# Patient Record
Sex: Female | Born: 1950 | Race: White | Hispanic: No | Smoking: Former smoker
Health system: Southern US, Community
[De-identification: ages and names within clinical notes are randomized; demographics above are authoritative.]

## PROBLEM LIST (undated history)

## (undated) DIAGNOSIS — K769 Liver disease, unspecified: Secondary | ICD-10-CM

## (undated) DIAGNOSIS — M5412 Radiculopathy, cervical region: Secondary | ICD-10-CM

## (undated) DIAGNOSIS — E119 Type 2 diabetes mellitus without complications: Secondary | ICD-10-CM

## (undated) DIAGNOSIS — E785 Hyperlipidemia, unspecified: Secondary | ICD-10-CM

## (undated) DIAGNOSIS — R918 Other nonspecific abnormal finding of lung field: Secondary | ICD-10-CM

## (undated) DIAGNOSIS — A499 Bacterial infection, unspecified: Secondary | ICD-10-CM

## (undated) DIAGNOSIS — F32A Depression, unspecified: Secondary | ICD-10-CM

## (undated) DIAGNOSIS — G4730 Sleep apnea, unspecified: Secondary | ICD-10-CM

## (undated) DIAGNOSIS — I7300 Raynaud's syndrome without gangrene: Secondary | ICD-10-CM

## (undated) DIAGNOSIS — Z1612 Extended spectrum beta lactamase (ESBL) resistance: Secondary | ICD-10-CM

## (undated) DIAGNOSIS — D696 Thrombocytopenia, unspecified: Secondary | ICD-10-CM

## (undated) DIAGNOSIS — D126 Benign neoplasm of colon, unspecified: Secondary | ICD-10-CM

## (undated) DIAGNOSIS — Z720 Tobacco use: Secondary | ICD-10-CM

## (undated) DIAGNOSIS — R590 Localized enlarged lymph nodes: Secondary | ICD-10-CM

## (undated) DIAGNOSIS — J449 Chronic obstructive pulmonary disease, unspecified: Secondary | ICD-10-CM

## (undated) DIAGNOSIS — C3491 Malignant neoplasm of unspecified part of right bronchus or lung: Secondary | ICD-10-CM

## (undated) DIAGNOSIS — G959 Disease of spinal cord, unspecified: Secondary | ICD-10-CM

## (undated) DIAGNOSIS — I700 Atherosclerosis of aorta: Secondary | ICD-10-CM

## (undated) DIAGNOSIS — Z87891 Personal history of nicotine dependence: Principal | ICD-10-CM

## (undated) DIAGNOSIS — K219 Gastro-esophageal reflux disease without esophagitis: Secondary | ICD-10-CM

## (undated) DIAGNOSIS — D509 Iron deficiency anemia, unspecified: Secondary | ICD-10-CM

## (undated) DIAGNOSIS — I2510 Atherosclerotic heart disease of native coronary artery without angina pectoris: Secondary | ICD-10-CM

## (undated) DIAGNOSIS — I340 Nonrheumatic mitral (valve) insufficiency: Secondary | ICD-10-CM

## (undated) DIAGNOSIS — R519 Headache, unspecified: Secondary | ICD-10-CM

## (undated) DIAGNOSIS — A419 Sepsis, unspecified organism: Secondary | ICD-10-CM

## (undated) DIAGNOSIS — K7460 Unspecified cirrhosis of liver: Secondary | ICD-10-CM

## (undated) DIAGNOSIS — I10 Essential (primary) hypertension: Secondary | ICD-10-CM

## (undated) DIAGNOSIS — M545 Low back pain, unspecified: Secondary | ICD-10-CM

## (undated) DIAGNOSIS — I8500 Esophageal varices without bleeding: Secondary | ICD-10-CM

## (undated) DIAGNOSIS — R296 Repeated falls: Secondary | ICD-10-CM

## (undated) DIAGNOSIS — G4700 Insomnia, unspecified: Secondary | ICD-10-CM

## (undated) DIAGNOSIS — M1990 Unspecified osteoarthritis, unspecified site: Secondary | ICD-10-CM

## (undated) DIAGNOSIS — R0600 Dyspnea, unspecified: Secondary | ICD-10-CM

## (undated) DIAGNOSIS — F329 Major depressive disorder, single episode, unspecified: Secondary | ICD-10-CM

## (undated) DIAGNOSIS — K766 Portal hypertension: Secondary | ICD-10-CM

## (undated) DIAGNOSIS — I671 Cerebral aneurysm, nonruptured: Secondary | ICD-10-CM

## (undated) DIAGNOSIS — F419 Anxiety disorder, unspecified: Secondary | ICD-10-CM

## (undated) DIAGNOSIS — N6019 Diffuse cystic mastopathy of unspecified breast: Secondary | ICD-10-CM

## (undated) DIAGNOSIS — C801 Malignant (primary) neoplasm, unspecified: Secondary | ICD-10-CM

## (undated) HISTORY — PX: CHOLECYSTECTOMY: SHX55

## (undated) HISTORY — DX: Sleep apnea, unspecified: G47.30

## (undated) HISTORY — PX: BREAST BIOPSY: SHX20

## (undated) HISTORY — PX: TONSILLECTOMY: SUR1361

## (undated) HISTORY — PX: ABDOMINAL HYSTERECTOMY: SHX81

## (undated) HISTORY — DX: Personal history of nicotine dependence: Z87.891

## (undated) HISTORY — PX: APPENDECTOMY: SHX54

## (undated) HISTORY — DX: Gastro-esophageal reflux disease without esophagitis: K21.9

## (undated) HISTORY — PX: EYE SURGERY: SHX253

## (undated) HISTORY — DX: Liver disease, unspecified: K76.9

---
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
## 1998-03-08 ENCOUNTER — Encounter: Payer: Self-pay | Admitting: Neurosurgery

## 1998-03-08 ENCOUNTER — Ambulatory Visit (HOSPITAL_COMMUNITY): Admission: RE | Admit: 1998-03-08 | Discharge: 1998-03-08 | Payer: Self-pay | Admitting: Neurosurgery

## 1998-03-12 ENCOUNTER — Ambulatory Visit: Admission: RE | Admit: 1998-03-12 | Discharge: 1998-03-12 | Payer: Self-pay | Admitting: Anesthesiology

## 2004-05-16 ENCOUNTER — Ambulatory Visit: Payer: Self-pay

## 2004-12-14 ENCOUNTER — Encounter: Admission: RE | Admit: 2004-12-14 | Discharge: 2004-12-14 | Payer: Self-pay | Admitting: Gastroenterology

## 2005-01-03 ENCOUNTER — Other Ambulatory Visit: Payer: Self-pay

## 2005-01-03 ENCOUNTER — Inpatient Hospital Stay: Payer: Self-pay | Admitting: Internal Medicine

## 2005-03-07 ENCOUNTER — Ambulatory Visit: Payer: Self-pay | Admitting: Internal Medicine

## 2005-05-18 ENCOUNTER — Ambulatory Visit: Payer: Self-pay | Admitting: Internal Medicine

## 2005-05-29 ENCOUNTER — Ambulatory Visit: Payer: Self-pay | Admitting: Internal Medicine

## 2005-06-19 ENCOUNTER — Ambulatory Visit: Payer: Self-pay | Admitting: Internal Medicine

## 2005-07-27 ENCOUNTER — Ambulatory Visit: Payer: Self-pay

## 2005-09-25 ENCOUNTER — Ambulatory Visit: Payer: Self-pay | Admitting: Surgery

## 2005-10-23 ENCOUNTER — Ambulatory Visit: Payer: Self-pay | Admitting: Gastroenterology

## 2005-12-04 ENCOUNTER — Ambulatory Visit: Payer: Self-pay | Admitting: Gastroenterology

## 2006-01-09 ENCOUNTER — Other Ambulatory Visit: Payer: Self-pay

## 2006-01-15 ENCOUNTER — Ambulatory Visit: Payer: Self-pay | Admitting: Gastroenterology

## 2006-04-17 ENCOUNTER — Ambulatory Visit: Payer: Self-pay | Admitting: Internal Medicine

## 2006-04-25 ENCOUNTER — Ambulatory Visit: Payer: Self-pay | Admitting: Internal Medicine

## 2006-05-21 ENCOUNTER — Ambulatory Visit: Payer: Self-pay | Admitting: Internal Medicine

## 2006-10-08 ENCOUNTER — Ambulatory Visit: Payer: Self-pay | Admitting: Anesthesiology

## 2006-11-05 ENCOUNTER — Ambulatory Visit: Payer: Self-pay | Admitting: Anesthesiology

## 2006-11-16 ENCOUNTER — Ambulatory Visit: Payer: Self-pay | Admitting: Family Medicine

## 2006-12-11 ENCOUNTER — Ambulatory Visit: Payer: Self-pay | Admitting: Anesthesiology

## 2007-01-30 ENCOUNTER — Ambulatory Visit: Payer: Self-pay | Admitting: Internal Medicine

## 2007-04-24 ENCOUNTER — Ambulatory Visit: Payer: Self-pay | Admitting: Anesthesiology

## 2007-05-14 ENCOUNTER — Observation Stay: Payer: Self-pay | Admitting: Internal Medicine

## 2007-05-22 ENCOUNTER — Ambulatory Visit: Payer: Self-pay | Admitting: Internal Medicine

## 2007-06-24 ENCOUNTER — Ambulatory Visit: Payer: Self-pay | Admitting: Anesthesiology

## 2007-06-26 ENCOUNTER — Ambulatory Visit: Payer: Self-pay | Admitting: Internal Medicine

## 2007-09-03 ENCOUNTER — Ambulatory Visit: Payer: Self-pay | Admitting: Anesthesiology

## 2007-09-25 ENCOUNTER — Ambulatory Visit: Payer: Self-pay | Admitting: Internal Medicine

## 2007-09-26 ENCOUNTER — Ambulatory Visit: Payer: Self-pay | Admitting: Internal Medicine

## 2008-01-15 ENCOUNTER — Ambulatory Visit: Payer: Self-pay | Admitting: Anesthesiology

## 2008-05-25 ENCOUNTER — Ambulatory Visit: Payer: Self-pay | Admitting: Internal Medicine

## 2008-06-29 ENCOUNTER — Ambulatory Visit: Payer: Self-pay | Admitting: Internal Medicine

## 2008-09-22 ENCOUNTER — Ambulatory Visit: Payer: Self-pay | Admitting: Anesthesiology

## 2008-12-09 ENCOUNTER — Ambulatory Visit: Payer: Self-pay | Admitting: Anesthesiology

## 2009-02-03 ENCOUNTER — Ambulatory Visit: Payer: Self-pay | Admitting: Anesthesiology

## 2009-06-02 ENCOUNTER — Ambulatory Visit: Payer: Self-pay | Admitting: Internal Medicine

## 2009-06-28 ENCOUNTER — Ambulatory Visit: Payer: Self-pay | Admitting: Anesthesiology

## 2009-09-23 ENCOUNTER — Ambulatory Visit: Payer: Self-pay | Admitting: Internal Medicine

## 2009-10-18 ENCOUNTER — Ambulatory Visit: Payer: Self-pay | Admitting: Anesthesiology

## 2010-02-09 ENCOUNTER — Ambulatory Visit: Payer: Self-pay | Admitting: Anesthesiology

## 2010-04-14 ENCOUNTER — Ambulatory Visit: Payer: Self-pay | Admitting: Internal Medicine

## 2010-04-20 ENCOUNTER — Ambulatory Visit: Payer: Self-pay | Admitting: Anesthesiology

## 2010-11-07 ENCOUNTER — Ambulatory Visit: Payer: Self-pay | Admitting: Internal Medicine

## 2010-11-11 ENCOUNTER — Emergency Department: Payer: Self-pay | Admitting: Emergency Medicine

## 2011-11-21 ENCOUNTER — Ambulatory Visit: Payer: Self-pay | Admitting: Internal Medicine

## 2011-12-22 ENCOUNTER — Ambulatory Visit: Payer: Self-pay | Admitting: Internal Medicine

## 2011-12-22 LAB — CREATININE, SERUM
Creatinine: 0.85 mg/dL (ref 0.60–1.30)
EGFR (African American): >60
EGFR (Non-African Amer.): >60

## 2012-07-30 ENCOUNTER — Ambulatory Visit: Payer: Self-pay | Admitting: Physician Assistant

## 2012-10-02 ENCOUNTER — Ambulatory Visit: Payer: Self-pay | Admitting: Internal Medicine

## 2012-11-21 ENCOUNTER — Ambulatory Visit: Payer: Self-pay | Admitting: Internal Medicine

## 2012-12-24 ENCOUNTER — Ambulatory Visit: Payer: Self-pay | Admitting: Gastroenterology

## 2012-12-24 LAB — CBC WITH DIFFERENTIAL/PLATELET
Basophil #: 0 10*3/uL (ref 0.0–0.1)
Basophil %: 0.9 %
Eosinophil #: 0.1 10*3/uL (ref 0.0–0.7)
Eosinophil %: 1.9 %
HCT: 39.2 % (ref 35.0–47.0)
HGB: 13.2 g/dL (ref 12.0–16.0)
Lymphocyte #: 1.3 10*3/uL (ref 1.0–3.6)
Lymphocyte %: 28.6 %
MCH: 29.9 pg (ref 26.0–34.0)
MCHC: 33.5 g/dL (ref 32.0–36.0)
MCV: 89 fL (ref 80–100)
Monocyte #: 0.3 x10 3/mm (ref 0.2–0.9)
Monocyte %: 6.9 %
Neutrophil #: 2.8 10*3/uL (ref 1.4–6.5)
Neutrophil %: 61.7 %
Platelet: 91 10*3/uL — ABNORMAL LOW (ref 150–440)
RBC: 4.4 10*6/uL (ref 3.80–5.20)
RDW: 14.8 % — ABNORMAL HIGH (ref 11.5–14.5)
WBC: 4.6 10*3/uL (ref 3.6–11.0)

## 2012-12-24 LAB — PROTIME-INR
INR: 1.1
Prothrombin Time: 14.3 secs (ref 11.5–14.7)

## 2012-12-24 LAB — APTT: Activated PTT: 29.4 secs (ref 23.6–35.9)

## 2012-12-27 LAB — PATHOLOGY REPORT

## 2013-02-02 ENCOUNTER — Ambulatory Visit: Payer: Self-pay | Admitting: Hematology and Oncology

## 2013-02-17 ENCOUNTER — Inpatient Hospital Stay: Payer: Self-pay | Admitting: Internal Medicine

## 2013-02-17 LAB — COMPREHENSIVE METABOLIC PANEL
ALT: 28 U/L (ref 12–78)
ANION GAP: 6 — AB (ref 7–16)
Albumin: 3.3 g/dL — ABNORMAL LOW (ref 3.4–5.0)
Alkaline Phosphatase: 108 U/L
BILIRUBIN TOTAL: 0.5 mg/dL (ref 0.2–1.0)
BUN: 32 mg/dL — AB (ref 7–18)
CO2: 24 mmol/L (ref 21–32)
CREATININE: 0.81 mg/dL (ref 0.60–1.30)
Calcium, Total: 9.5 mg/dL (ref 8.5–10.1)
Chloride: 106 mmol/L (ref 98–107)
EGFR (Non-African Amer.): >60
Glucose: 469 mg/dL — ABNORMAL HIGH (ref 65–99)
Osmolality: 299 (ref 275–301)
POTASSIUM: 4.4 mmol/L (ref 3.5–5.1)
SGOT(AST): 27 U/L (ref 15–37)
Sodium: 136 mmol/L (ref 136–145)
Total Protein: 6.6 g/dL (ref 6.4–8.2)

## 2013-02-17 LAB — HEMATOCRIT: HCT: 29.1 % — AB (ref 35.0–47.0)

## 2013-02-17 LAB — PROTIME-INR
INR: 1.2
PROTHROMBIN TIME: 14.9 s — AB (ref 11.5–14.7)

## 2013-02-17 LAB — CBC
HCT: 31.7 % — AB (ref 35.0–47.0)
HGB: 10.8 g/dL — AB (ref 12.0–16.0)
MCH: 31.6 pg (ref 26.0–34.0)
MCHC: 34.1 g/dL (ref 32.0–36.0)
MCV: 93 fL (ref 80–100)
PLATELETS: 162 10*3/uL (ref 150–440)
RBC: 3.42 10*6/uL — ABNORMAL LOW (ref 3.80–5.20)
RDW: 15 % — ABNORMAL HIGH (ref 11.5–14.5)
WBC: 10.1 10*3/uL (ref 3.6–11.0)

## 2013-02-17 LAB — HEMOGLOBIN: HGB: 9.6 g/dL — AB (ref 12.0–16.0)

## 2013-02-18 LAB — CBC WITH DIFFERENTIAL/PLATELET
BASOS ABS: 0.1 10*3/uL (ref 0.0–0.1)
Basophil %: 0.7 %
EOS PCT: 0.9 %
Eosinophil #: 0.1 10*3/uL (ref 0.0–0.7)
HCT: 27.3 % — AB (ref 35.0–47.0)
HGB: 9.4 g/dL — AB (ref 12.0–16.0)
Lymphocyte #: 2.4 10*3/uL (ref 1.0–3.6)
Lymphocyte %: 31 %
MCH: 31 pg (ref 26.0–34.0)
MCHC: 34.5 g/dL (ref 32.0–36.0)
MCV: 90 fL (ref 80–100)
MONOS PCT: 7 %
Monocyte #: 0.5 x10 3/mm (ref 0.2–0.9)
NEUTROS ABS: 4.7 10*3/uL (ref 1.4–6.5)
Neutrophil %: 60.4 %
PLATELETS: 90 10*3/uL — AB (ref 150–440)
RBC: 3.04 10*6/uL — ABNORMAL LOW (ref 3.80–5.20)
RDW: 14.6 % — AB (ref 11.5–14.5)
WBC: 7.7 10*3/uL (ref 3.6–11.0)

## 2013-02-18 LAB — HEMOGLOBIN
HGB: 8.8 g/dL — ABNORMAL LOW (ref 12.0–16.0)
HGB: 8.7 g/dL — ABNORMAL LOW (ref 12.0–16.0)

## 2013-02-19 LAB — COMPREHENSIVE METABOLIC PANEL
Albumin: 2.6 g/dL — ABNORMAL LOW (ref 3.4–5.0)
Alkaline Phosphatase: 66 U/L
Anion Gap: 7 (ref 7–16)
BUN: 22 mg/dL — ABNORMAL HIGH (ref 7–18)
Bilirubin,Total: 0.3 mg/dL (ref 0.2–1.0)
CALCIUM: 7.8 mg/dL — AB (ref 8.5–10.1)
Chloride: 115 mmol/L — ABNORMAL HIGH (ref 98–107)
Co2: 21 mmol/L (ref 21–32)
Creatinine: 0.89 mg/dL (ref 0.60–1.30)
EGFR (Non-African Amer.): >60
Glucose: 255 mg/dL — ABNORMAL HIGH (ref 65–99)
OSMOLALITY: 297 (ref 275–301)
Potassium: 3.9 mmol/L (ref 3.5–5.1)
SGOT(AST): 34 U/L (ref 15–37)
SGPT (ALT): 21 U/L (ref 12–78)
SODIUM: 143 mmol/L (ref 136–145)
Total Protein: 5.2 g/dL — ABNORMAL LOW (ref 6.4–8.2)

## 2013-02-19 LAB — CBC WITH DIFFERENTIAL/PLATELET
BASOS ABS: 0 10*3/uL (ref 0.0–0.1)
Basophil %: 0.8 %
EOS ABS: 0.1 10*3/uL (ref 0.0–0.7)
EOS PCT: 2.5 %
HCT: 24.4 % — AB (ref 35.0–47.0)
HGB: 8.5 g/dL — ABNORMAL LOW (ref 12.0–16.0)
LYMPHS ABS: 1.7 10*3/uL (ref 1.0–3.6)
Lymphocyte %: 32.7 %
MCH: 32 pg (ref 26.0–34.0)
MCHC: 35 g/dL (ref 32.0–36.0)
MCV: 91 fL (ref 80–100)
MONOS PCT: 5 %
Monocyte #: 0.3 x10 3/mm (ref 0.2–0.9)
Neutrophil #: 3.1 10*3/uL (ref 1.4–6.5)
Neutrophil %: 59 %
Platelet: 76 10*3/uL — ABNORMAL LOW (ref 150–440)
RBC: 2.67 10*6/uL — ABNORMAL LOW (ref 3.80–5.20)
RDW: 14.6 % — AB (ref 11.5–14.5)
WBC: 5.2 10*3/uL (ref 3.6–11.0)

## 2013-02-19 LAB — HEMOGLOBIN: HGB: 8.5 g/dL — AB (ref 12.0–16.0)

## 2013-02-19 LAB — HEMATOCRIT: HCT: 23.2 % — AB (ref 35.0–47.0)

## 2013-02-20 LAB — CBC WITH DIFFERENTIAL/PLATELET
Basophil #: 0 10*3/uL (ref 0.0–0.1)
Basophil %: 0.8 %
EOS ABS: 0.1 10*3/uL (ref 0.0–0.7)
Eosinophil %: 2.1 %
Eosinophil: 1 %
HCT: 23.2 % — AB (ref 35.0–47.0)
HGB: 8.1 g/dL — ABNORMAL LOW (ref 12.0–16.0)
Lymphocyte #: 1.1 10*3/uL (ref 1.0–3.6)
Lymphocyte %: 31 %
Lymphocytes: 36 %
MCH: 31.9 pg (ref 26.0–34.0)
MCHC: 35 g/dL (ref 32.0–36.0)
MCV: 91 fL (ref 80–100)
MONO ABS: 0.2 x10 3/mm (ref 0.2–0.9)
MONOS PCT: 5.4 %
Monocytes: 11 %
NEUTROS PCT: 60.7 %
Neutrophil #: 2.2 10*3/uL (ref 1.4–6.5)
Platelet: 67 10*3/uL — ABNORMAL LOW (ref 150–440)
RBC: 2.55 10*6/uL — AB (ref 3.80–5.20)
RDW: 14.3 % (ref 11.5–14.5)
SEGMENTED NEUTROPHILS: 52 %
WBC: 3.6 10*3/uL (ref 3.6–11.0)

## 2013-02-20 LAB — BASIC METABOLIC PANEL
ANION GAP: 5 — AB (ref 7–16)
BUN: 12 mg/dL (ref 7–18)
Calcium, Total: 8.2 mg/dL — ABNORMAL LOW (ref 8.5–10.1)
Chloride: 112 mmol/L — ABNORMAL HIGH (ref 98–107)
Co2: 25 mmol/L (ref 21–32)
Creatinine: 0.81 mg/dL (ref 0.60–1.30)
EGFR (African American): >60
EGFR (Non-African Amer.): >60
Glucose: 173 mg/dL — ABNORMAL HIGH (ref 65–99)
Osmolality: 287 (ref 275–301)
Potassium: 3.6 mmol/L (ref 3.5–5.1)
Sodium: 142 mmol/L (ref 136–145)

## 2013-02-21 LAB — CBC WITH DIFFERENTIAL/PLATELET
BASOS PCT: 0.8 %
Basophil #: 0 10*3/uL (ref 0.0–0.1)
EOS ABS: 0.1 10*3/uL (ref 0.0–0.7)
EOS PCT: 2.5 %
HCT: 22.5 % — AB (ref 35.0–47.0)
HGB: 8 g/dL — ABNORMAL LOW (ref 12.0–16.0)
Lymphocyte #: 1.2 10*3/uL (ref 1.0–3.6)
Lymphocyte %: 41.7 %
MCH: 32.3 pg (ref 26.0–34.0)
MCHC: 35.4 g/dL (ref 32.0–36.0)
MCV: 91 fL (ref 80–100)
MONOS PCT: 6.1 %
Monocyte #: 0.2 x10 3/mm (ref 0.2–0.9)
Neutrophil #: 1.5 10*3/uL (ref 1.4–6.5)
Neutrophil %: 48.9 %
Platelet: 61 10*3/uL — ABNORMAL LOW (ref 150–440)
RBC: 2.47 10*6/uL — ABNORMAL LOW (ref 3.80–5.20)
RDW: 14.1 % (ref 11.5–14.5)
WBC: 3 10*3/uL — ABNORMAL LOW (ref 3.6–11.0)

## 2013-02-21 LAB — FOLATE: Folic Acid: 37.1 ng/mL (ref 3.1–100.0)

## 2013-02-22 LAB — CBC WITH DIFFERENTIAL/PLATELET
Basophil #: 0 10*3/uL (ref 0.0–0.1)
Basophil %: 0.6 %
EOS ABS: 0.1 10*3/uL (ref 0.0–0.7)
Eosinophil %: 2 %
HCT: 23.8 % — AB (ref 35.0–47.0)
HGB: 8.1 g/dL — AB (ref 12.0–16.0)
Lymphocyte #: 1.3 10*3/uL (ref 1.0–3.6)
Lymphocyte %: 34.6 %
MCH: 31.1 pg (ref 26.0–34.0)
MCHC: 34.2 g/dL (ref 32.0–36.0)
MCV: 91 fL (ref 80–100)
Monocyte #: 0.2 x10 3/mm (ref 0.2–0.9)
Monocyte %: 5.5 %
NEUTROS PCT: 57.3 %
Neutrophil #: 2.2 10*3/uL (ref 1.4–6.5)
Platelet: 71 10*3/uL — ABNORMAL LOW (ref 150–440)
RBC: 2.61 10*6/uL — AB (ref 3.80–5.20)
RDW: 14.1 % (ref 11.5–14.5)
WBC: 3.8 10*3/uL (ref 3.6–11.0)

## 2013-03-17 ENCOUNTER — Ambulatory Visit: Payer: Self-pay | Admitting: Gastroenterology

## 2013-04-02 ENCOUNTER — Ambulatory Visit: Payer: Self-pay | Admitting: Hematology and Oncology

## 2013-05-12 ENCOUNTER — Ambulatory Visit: Payer: Self-pay | Admitting: Gastroenterology

## 2013-06-23 ENCOUNTER — Ambulatory Visit: Payer: Self-pay | Admitting: Gastroenterology

## 2013-08-07 ENCOUNTER — Ambulatory Visit: Payer: Self-pay | Admitting: Internal Medicine

## 2013-11-24 ENCOUNTER — Ambulatory Visit: Payer: Self-pay | Admitting: Internal Medicine

## 2013-11-28 ENCOUNTER — Emergency Department: Payer: Self-pay | Admitting: Emergency Medicine

## 2013-11-28 LAB — URINALYSIS, COMPLETE
Bilirubin,UR: NEGATIVE
Blood: NEGATIVE
GLUCOSE, UR: NEGATIVE mg/dL (ref 0–75)
KETONE: NEGATIVE
Leukocyte Esterase: NEGATIVE
Nitrite: NEGATIVE
PROTEIN: NEGATIVE
Ph: 6 (ref 4.5–8.0)
RBC,UR: 1 /HPF (ref 0–5)
Specific Gravity: 1.002 (ref 1.003–1.030)
Squamous Epithelial: 2

## 2013-11-28 LAB — COMPREHENSIVE METABOLIC PANEL
ALBUMIN: 3.7 g/dL (ref 3.4–5.0)
ANION GAP: 6 — AB (ref 7–16)
Alkaline Phosphatase: 138 U/L — ABNORMAL HIGH
BUN: 10 mg/dL (ref 7–18)
Bilirubin,Total: 0.4 mg/dL (ref 0.2–1.0)
Calcium, Total: 8.8 mg/dL (ref 8.5–10.1)
Chloride: 103 mmol/L (ref 98–107)
Co2: 28 mmol/L (ref 21–32)
Creatinine: 0.68 mg/dL (ref 0.60–1.30)
EGFR (Non-African Amer.): >60
GLUCOSE: 126 mg/dL — AB (ref 65–99)
Osmolality: 274 (ref 275–301)
POTASSIUM: 3.8 mmol/L (ref 3.5–5.1)
SGOT(AST): 35 U/L (ref 15–37)
SGPT (ALT): 34 U/L
Sodium: 137 mmol/L (ref 136–145)
Total Protein: 7.7 g/dL (ref 6.4–8.2)

## 2013-11-28 LAB — CBC WITH DIFFERENTIAL/PLATELET
Basophil #: 0.1 10*3/uL (ref 0.0–0.1)
Basophil %: 0.8 %
Eosinophil #: 0.1 10*3/uL (ref 0.0–0.7)
Eosinophil %: 1.9 %
HCT: 38 % (ref 35.0–47.0)
HGB: 12.3 g/dL (ref 12.0–16.0)
Lymphocyte #: 1.7 10*3/uL (ref 1.0–3.6)
Lymphocyte %: 26.1 %
MCH: 27.2 pg (ref 26.0–34.0)
MCHC: 32.4 g/dL (ref 32.0–36.0)
MCV: 84 fL (ref 80–100)
Monocyte #: 0.5 x10 3/mm (ref 0.2–0.9)
Monocyte %: 8 %
Neutrophil #: 4.1 10*3/uL (ref 1.4–6.5)
Neutrophil %: 63.2 %
Platelet: 126 10*3/uL — ABNORMAL LOW (ref 150–440)
RBC: 4.52 10*6/uL (ref 3.80–5.20)
RDW: 17.3 % — ABNORMAL HIGH (ref 11.5–14.5)
WBC: 6.5 10*3/uL (ref 3.6–11.0)

## 2013-11-28 LAB — LIPASE, BLOOD: LIPASE: 77 U/L (ref 73–393)

## 2014-03-23 ENCOUNTER — Ambulatory Visit: Payer: Self-pay | Admitting: Gastroenterology

## 2014-04-14 ENCOUNTER — Emergency Department
Admit: 2014-04-14 | Disposition: A | Discharge status: Home / Self Care | Payer: Self-pay | Admitting: Emergency Medicine

## 2014-04-17 ENCOUNTER — Other Ambulatory Visit: Payer: Self-pay | Admitting: Gastroenterology

## 2014-04-17 DIAGNOSIS — K7460 Unspecified cirrhosis of liver: Secondary | ICD-10-CM

## 2014-04-25 NOTE — Consult Note (Signed)
Details:   - EGD done today.   5 bands appliled.  Some bleeding during procedure but no bleeding at end.   Recs:  cont octreotide and protonix drips - watch H/h and hemodynamics closely - NO NG tube for 7 days - clear liquids for 48 hours - cont Abx. - keep pt in ICU until tomorrow.  - call GI consultant with any changes in clinical status.  - will cont to follow   Electronic Signatures for Addendum Section:  Arther Dames (MD) (Signed Addendum 17-Feb-15 17:10)  Will need to start nadolol in about 24 hours.   Electronic Signatures: Arther Dames (MD)  (Signed 17-Feb-15 16:41)  Authored: Details   Last Updated: 17-Feb-15 17:10 by Arther Dames (MD)

## 2014-04-25 NOTE — H&P (Signed)
PATIENT NAME:  Vanessa Oneill, Vanessa Oneill MR#:  [id number] DATE OF BIRTH:  05/23/1950  DATE OF ADMISSION:  02/17/2013  REFERRING PHYSICIAN: Dr. Owens Shark  PRIMARY CARE PHYSICIAN: Dr. Ramonita Lab from [facility].   CHIEF COMPLAINT: Vomiting blood.   HISTORY OF PRESENT ILLNESS: A 64 year old Caucasian female with a history of cirrhosis, nonalcoholic, as well as diabetes, presenting with hematemesis, one day duration of hematemesis, started coffee-ground earlier this morning, progressed to frank blood. In the Emergency Department, she had 1.5 liters frank blood hematemesis. She had associated abdominal pain described as cramping, epigastric, nonradiating, 6 to 7 out of 10. No worsening or relieving factors. Emergency Department course: Received 2 units of packed red blood cells, now hemodynamically stable.   REVIEW OF SYSTEMS:  CONSTITUTIONAL: Denies fevers, fatigue. Positive for weakness.  EYES: Denies blurred vision, double vision, eye pain.  EARS, NOSE, THROAT: Denies tinnitus, ear pain, hearing loss.  RESPIRATORY: Denies cough, wheeze, shortness of breath.  CARDIOVASCULAR: Denies chest pain, palpitations, edemaGASTROINTESTINAL: Positive for hematemesis and nausea, abdominal pain as described above.  GENITOURINARY: Denies dysuria, hematuria.  ENDOCRINE: Denies nocturia or thyroid problems.  HEMATOLOGIC AND LYMPHATIC:  Positive for bleeding, as described above. Denies easy bruising.  SKIN: Denies rash or lesion.  MUSCULOSKELETAL: Denies pain in neck, back, shoulder, knees, hips  arthritic symptoms.  NEUROLOGIC: Denies paralysis, paresthesias.  PSYCHIATRIC: Denies anxiety or depressive symptoms.  Otherwise, full review of systems performed by me is negative.   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 She was only recently diagnosed. No history of SBP. No history of GI bleeds.   SOCIAL HISTORY: Denies any alcohol use. Positive for tobacco  use. Denies any drug use.   FAMILY HISTORY: Positive for hypertension. No known cirrhosis.   ALLERGIES: Amoxicillin, Betadine, codeine.   HOME MEDICATIONS: Aspirin 81 mg p.o. daily, Norco 325/5 mg p.o. q.6 hours as needed for pain, trazodone 50 mg p.o. at bedtime, Glimepiride 4 mg p.o. b.i.d., metformin 1000 mg p.o. b.i.d., Victoza 18 mg subcutaneous at bedtime, risperidone 1 tab p.o. at bedtime, Xanax 0.25 mg p.o. q.8 hours as needed for anxiety, Advair 250/50 mcg 1 puff b.i.d. as needed for shortness of breath, Albuterol 90 mcg inhalation 2 puffs q.4 hours as needed for shortness of breath, cyclobenzaprine 10 mg p.o. 3 times daily as needed for muscle spasm, Protonix 40 mg p.o. b.i.d.   PHYSICAL EXAMINATION: VITAL SIGNS: Temperature 98.2, respirations 20, heart rate 126, blood pressure 106/60, saturating 100% on room air. Weight 88.5 kg, BMI 29.7.  GENERAL: Critically ill-appearing Caucasian female, currently in minimal distress.  HEAD: Normocephalic, atraumatic.  EYES: Pupils equal, round and reactive to light. Extraocular muscles intact. No scleral icterus. Pale conjunctiva.  MOUTH: Dry mucosal membranes. Dentition intact. No abscess noted.  EARS, NOSE, AND THROAT: Throat clear, without exudates. No external lesions.  NECK: Supple. No thyromegaly. No nodules. No JVD.  PULMONARY: Clear to auscultation bilaterally. No wheezes, rhonchi. No accessory muscle use. Good respiratory effort. CHEST: Nontender to palpation.  CARDIOVASCULAR: S1, S2. Regular rate and rhythm. No murmurs, rubs, or gallops. No edema. Pedal pulses 2+ bilaterally.  GASTROINTESTINAL: Soft. Tenderness to palpation over epigastric, without rebound or guarding. Mild distention. No masses. Positive bowel sounds. No hepatosplenomegaly.  MUSCULOSKELETAL: No swelling, clubbing, edema. Range of motion full in all extremities.  NEUROLOGIC: Cranial nerves II through XII intact. No gross focal neurological deficit. Sensation intact.  Reflexes intact.  SKIN: No ulcerations, lesions, rash, cyanosis. Skin warm and  dry. Turgor poor.  PSYCHIATRIC: Mood and affect within normal limits. Patient awake, alert, oriented x3. Insight and judgment intact.   LABORATORY DATA: Sodium 136, potassium 4.4, chloride 106, bicarbonate 24, BUN 32, creatinine 0.81, glucose 469. LFTs: Albumin 3.3, otherwise within normal limits. WBC 10.1, hemoglobin 10.8, platelets of 162.   ASSESSMENT AND PLAN: A 64 year old Caucasian female with history of cirrhosis, presenting with hematemesis.  1.  Gastrointestinal bleed with hematemesis. Case discussed with Dr. Allen Norris by Emergency Department. Given cirrhosis, suspect this is esophageal in nature. Will schedule Zofran, CBC every 6 hours. She has already been typed and crossed to receive 2 units of packed red blood cells. If she requires more transfusion, she will need fresh frozen plasma as well as platelets. Started on octreotide drip; increase drip rate to 50 mcg/hour. Protonix drip is already running. Antibiotic coverage for spontaneous bacterial peritonitis prophylaxis.  2.  Diabetes. Hold oral agents. Add insulin sliding scale.  3.  Depression. Continue trazodone.  4.  Deep venous thrombosis prophylaxis with sequential compression devices.   CODE STATUS: The patient is full code.   CRITICAL CARE TIME SPENT: 45 minutes    ____________________________ Aaron Mose. Hower, MD dkh:cg D: 02/17/2013 23:05:15 ET T: 02/17/2013 23:40:15 ET JOB#: [id number]  cc: Aaron Mose. Hower, MD, <Dictator> DAVID Woodfin Ganja MD ELECTRONICALLY SIGNED 02/18/2013 2:06

## 2014-04-25 NOTE — Discharge Summary (Signed)
PATIENT NAME:  Vanessa Oneill, Vanessa Oneill MR#:  [id number] DATE OF BIRTH:  08-01-1950  DATE OF ADMISSION:  02/17/2013 DATE OF DISCHARGE:  02/22/2013   FINAL DIAGNOSES:  1. Acute gastrointestinal bleeding secondary to esophageal varices.  2. Acute blood loss anemia secondary to #1.  3. Cirrhosis, biopsy-proven stage II, thought to be secondary to fatty liver disease, and likely a source of the varices.  4. Chronic pain. 5. Adult-onset diabetes mellitus.  6. Hypertension.   HISTORY AND PHYSICAL: Please see dictated admission history and physical.   [place]: The patient was admitted with gross hematemesis. Dropped her hemoglobin level down to approximately 8. Required 2-unit transfusion. Saw GI, and she underwent endoscopy, with finding of bleeding esophageal varices. These were banded successfully with no further bleeding. Hemoglobin stayed stable between 8 and 8.5 for the remainder of her hospitalization. Her diet was slowly advanced, and on the day of discharge, she is ambulating independently, and she is tolerating soft foods.   She was noted to have worsening thrombocytopenia and leukopenia as well; she was evaluated by oncology, who thought this was all due to her cirrhosis and splenomegaly. Her blood counts did improve slightly, and at this point, was felt stable for her to go home. She needs to check her sugars twice a day and record these as 2 of her diabetic medications will be held at this point. She should follow a carbohydrate-controlled diet and keep her diet soft for 1 week. She will follow up in our office in 1 to 2 weeks and with Dr. Rayann Heman in 2 to 4 weeks.   DISCHARGE MEDICATIONS:  1. Metformin 1000 mg p.o. b.i.d.  2. Albuterol MDI 2 puffs 4 times a day as needed for shortness of breath.  3. Xanax 0.25 mg p.o. q.8 hours as needed for anxiety.  4. Pantoprazole 40 mg p.o. b.i.d.  5. Trazodone 50 mg p.o. at bedtime.  6. Risperdal 1 mg p.o. at bedtime.  7. Norco 5/325 one  p.o. q.6 hours as needed for pain.  8. Advair 250/50 one puff b.i.d. for COPD.  9. Levaquin 500 mg p.o. daily for 4 days to complete a 7-day course.   She will hold cyclobenzaprine, glimepiride and Victoza at this point. If her sugars begin rising, she may reintroduce the glimepiride.   ____________________________ Adin Hector, MD bjk:lb D: 02/22/2013 11:01:43 ET T: 02/22/2013 11:38:55 ET JOB#: [id number]  cc: Adin Hector, MD, <Dictator> Ramonita Lab MD ELECTRONICALLY SIGNED 02/26/2013 8:05

## 2014-04-25 NOTE — Consult Note (Signed)
PATIENT NAME:  Vanessa Oneill, Vanessa Oneill MR#:  [id number] DATE OF BIRTH:  03-Apr-1950  DATE OF CONSULTATION:  02/18/2013  REFERRING PHYSICIAN:  Dr. Ramonita Lab CONSULTING PHYSICIAN:  Arther Dames, MD  REASON FOR THE CONSULT:  Hematemesis.   HISTORY OF PRESENT ILLNESS:  Ms. Presswood is a 64 year old female with a history of cirrhosis, likely secondary to NAFLD, diabetes, presenting to the Emergency Room for evaluation of hematemesis. Ms. Grandt and her husband report that she initially threw up a small amount of dark material. However, shortly after this, she then did throw up multiple times some very bright red blood material. She also has had several episodes of dark sticky bowel movements.   Fortunately, since the Emergency Room last night, she has not had any further episodes of hematemesis or melena. She is currently feeling weak and tired, but otherwise does not have any additional complaints at this time.   She has never had bleeding before. She also of note was scheduled for an upper endoscopy on Thursday of this week due to her cirrhosis.   PAST MEDICAL HISTORY: 1.  Cirrhosis, likely NAFLD.  2.  Diabetes.  3.  Depression.   HOME MEDICATIONS:  1.  Aspirin 81 mg daily.  2.  Norco 325/5.  3.  Trazodone 50 mg at bedtime.  4.  Glimepiride 4 mg b.i.d.  5.  Metformin 1000 mg b.i.d.  6.  Victoza 18 mg subcu at bedtime.  7.  Risperidone 1 tablet at bedtime.  8.  Xanax 0.25 q.8.  9.  Advair 250/50 one puff b.i.d.  10.  Albuterol 90 mcg 2 puffs every 4 hours.  11.  Cyclobenzaprine 10 mg t.i.d.  12.  Protonix 40 mg b.i.d.   REVIEW OF SYSTEMS:  A 10-system review was conducted. It is negative except as stated in the HPI.   SOCIAL HISTORY:  She denies any alcohol to me. She is still an ongoing smoker.   FAMILY HISTORY:  She denies any family history of liver disease or GI malignancy.   ALLERGIES:  AMOXICILLIN, BETADINE, and CODEINE.   PHYSICAL EXAMINATION:   VITAL SIGNS:  Her current  temperature is 97.9, her pulse is an 84, blood pressure is 107/62, pulse ox is a 99% on 2 L of oxygen.   GENERAL: Alert and oriented times 4.  No acute distress. Appears stated age. Slightly somnolent. HEENT: Normocephalic/atraumatic. Extraocular movements are intact. Anicteric. NECK: Soft, supple. JVP appears normal. No adenopathy. CHEST: Clear to auscultation. No wheeze or crackle. Respirations unlabored. HEART: Regular. No murmur, rub, or gallop.  Normal S1 and S2. ABDOMEN: Soft, nontender, nondistended.  Normal active bowel sounds in all four quadrants.  No organomegaly. No masses. Positive for abdominal adiposity. EXTREMITIES: No swelling, well perfused. SKIN: No rash or lesion. Skin color, texture, turgor normal. NEUROLOGICAL: Grossly intact. PSYCHIATRIC: Normal tone and affect. MUSCULOSKELETAL: No joint swelling or erythema.    DATA:  Her sodium is 136, potassium 4.4, BUN 32, creatinine 0.81. Her liver enzymes are notable for an albumin of 3.3, otherwise normal liver enzymes. Her white count is 10.1; her hemoglobin is 8.7, which is down from 10.8 on presentation; platelets are 162. Her INR is 1.2.   ASSESSMENT AND PLAN:  Hematemesis: She does have a history of cirrhosis, which was proven on liver biopsy. Her labs are not particularly striking. It is not clear whether this is a variceal bleed or whether this is due to peptic ulcer disease. Nonetheless, fortunately, it appears as though the bleeding has  stopped at this time.   RECOMMENDATIONS:  We will perform an upper endoscopy later today. Please keep her n.p.o. We will also continue to monitor her hemodynamics and H and H closely. She should be continued on octreotide and Protonix as you are doing. In addition, I do agree with treating her with antibiotics at this time given her ongoing liver disease and concomitant GI bleed showing improved outcomes when antibiotics are given in this setting.   Further recommendations will be pending the  findings on the upper endoscopy. Thank you for this consult.    ____________________________ Arther Dames, MD mr:ms D: 02/18/2013 22:15:27 ET T: 02/18/2013 22:41:05 ET JOB#: [id number]  cc: Arther Dames, MD, <Dictator> Mellody Life MD ELECTRONICALLY SIGNED 02/25/2013 13:47

## 2014-04-25 NOTE — Consult Note (Signed)
History of Present Illness:  Reason for Consult Thrombocytopenia   HPI   A 64 year old Caucasian female with a history of cirrhosis, nonalcoholic, as well as diabetes, presenting with hematemesis, one day duration of hematemesis, started coffee-ground earlier this morning, progressed to frank blood. In the Emergency Department, she had 1.5 liters frank blood hematemesis. She had associated abdominal pain described as cramping, epigastric, nonradiating, 6 to 7 out of 10. No worsening or relieving factors. Emergency Department course: Received 2 units of packed red blood cells,admitted 02/17/13  PFSH:  Family History noncontributory   Social History negative alcohol, positive tobacco   Review of Systems:  General fatigue   Performance Status (ECOG) 2   HEENT no complaints   Lungs no complaints   Cardiac no complaints   GI vomiting   GU no complaints   Musculoskeletal no complaints   Extremities no complaints   Skin no complaints   Neuro no complaints   Endocrine no complaints   Psych no complaints   NURSING NOTES: ED Vital Sign Flow Sheet:   16-Feb-15 23:12   Pulse Pulse: 95   Respirations Respirations: 20   SBP SBP: 103   DBP DBP: 53   Pulse Ox % Pulse Ox %: 100   Pulse Ox Source Source: Oxygen   Pain Scale (0-10) Pain Scale (0-10): Scale:0   Cardiac Monitor On: Yes   Physical Exam:  General female in no acute distress, pale   HEENT: normal   Lungs: clear   Cardiac: regular rate, rhythm   Breast: not examined   Abdomen: soft  nontender  positive bowel sounds   Skin: intact   Extremities: No edema, rash or cyanosis   Neuro: AAOx3   Psych: normal appearance    Codeine: Chest Pain, Chest Tightness  Betadine: Rash  Amoxicillin: Rash  Assessment and Plan: Impression:   64 Year old female with non alcoholic steatosis with esophageal varices Plan:   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 No increased  blleding risk due to platelet count at this time.trending down again today.Is being followed by GI.discussed this with patient.not require heme workup and would only recommend platelt transfusion if platelt count < 30 000  Electronic Signatures: Georges Mouse (MD)  (Signed 19-Feb-15 14:39)  Authored: HISTORY OF PRESENT ILLNESS, PFSH, ROS, NURSING NOTES, PE, ALLERGIES, ASSESSMENT AND PLAN   Last Updated: 19-Feb-15 14:39 by Georges Mouse (MD)

## 2014-04-27 LAB — SURGICAL PATHOLOGY

## 2014-05-12 ENCOUNTER — Ambulatory Visit
Admission: RE | Admit: 2014-05-12 | Discharge: 2014-05-12 | Disposition: A | Discharge status: Home / Self Care | Payer: No Typology Code available for payment source | Source: Ambulatory Visit | Attending: Gastroenterology | Admitting: Gastroenterology

## 2014-05-12 DIAGNOSIS — K7460 Unspecified cirrhosis of liver: Secondary | ICD-10-CM | POA: Insufficient documentation

## 2014-05-12 DIAGNOSIS — Z9049 Acquired absence of other specified parts of digestive tract: Secondary | ICD-10-CM | POA: Diagnosis not present

## 2014-05-12 DIAGNOSIS — K7030 Alcoholic cirrhosis of liver without ascites: Secondary | ICD-10-CM | POA: Diagnosis present

## 2014-11-10 ENCOUNTER — Other Ambulatory Visit: Payer: Self-pay | Admitting: Internal Medicine

## 2014-11-10 DIAGNOSIS — Z1231 Encounter for screening mammogram for malignant neoplasm of breast: Secondary | ICD-10-CM

## 2014-11-12 ENCOUNTER — Other Ambulatory Visit: Payer: Self-pay | Admitting: Family Medicine

## 2014-11-12 ENCOUNTER — Encounter: Payer: Self-pay | Admitting: Family Medicine

## 2014-11-12 DIAGNOSIS — Z87891 Personal history of nicotine dependence: Secondary | ICD-10-CM | POA: Insufficient documentation

## 2014-11-12 HISTORY — DX: Personal history of nicotine dependence: Z87.891

## 2014-11-13 ENCOUNTER — Ambulatory Visit
Admission: RE | Admit: 2014-11-13 | Discharge: 2014-11-13 | Disposition: A | Discharge status: Home / Self Care | Payer: No Typology Code available for payment source | Source: Ambulatory Visit | Attending: Family Medicine | Admitting: Family Medicine

## 2014-11-13 ENCOUNTER — Inpatient Hospital Stay: Payer: No Typology Code available for payment source | Attending: Family Medicine | Admitting: Family Medicine

## 2014-11-13 ENCOUNTER — Encounter: Payer: Self-pay | Admitting: Family Medicine

## 2014-11-13 DIAGNOSIS — J439 Emphysema, unspecified: Secondary | ICD-10-CM | POA: Diagnosis not present

## 2014-11-13 DIAGNOSIS — K7460 Unspecified cirrhosis of liver: Secondary | ICD-10-CM | POA: Insufficient documentation

## 2014-11-13 DIAGNOSIS — I2510 Atherosclerotic heart disease of native coronary artery without angina pectoris: Secondary | ICD-10-CM | POA: Insufficient documentation

## 2014-11-13 DIAGNOSIS — Z87891 Personal history of nicotine dependence: Secondary | ICD-10-CM

## 2014-11-13 DIAGNOSIS — Z122 Encounter for screening for malignant neoplasm of respiratory organs: Secondary | ICD-10-CM | POA: Diagnosis not present

## 2014-11-13 NOTE — Progress Notes (Signed)
In accordance with CMS guidelines, patient has meet eligibility criteria including age, absence of signs or symptoms of lung cancer, the specific calculation of cigarette smoking pack-years was 48 years and is a current smoker.   A shared decision-making session was conducted prior to the performance of CT scan. This includes one or more decision aids, includes benefits and harms of screening, follow-up diagnostic testing, over-diagnosis, false positive rate, and total radiation exposure.  Counseling on the importance of adherence to annual lung cancer LDCT screening, impact of co-morbidities, and ability or willingness to undergo diagnosis and treatment is imperative for compliance of the program.  Counseling on the importance of continued smoking cessation for former smokers; the importance of smoking cessation for current smokers and information about tobacco cessation interventions have been given to patient including the [place] at [facility], 1800 quit [place], as well as Cancer Center specific smoking cessation programs.  Written order for lung cancer screening with LDCT has been given to the patient and any and all questions have been answered to the best of my abilities.   Yearly follow up will be scheduled by Shawn Perkins, Thoracic Navigator.   

## 2014-11-16 ENCOUNTER — Telehealth: Payer: Self-pay | Admitting: *Deleted

## 2014-11-16 NOTE — Telephone Encounter (Signed)
Notified patient of LDCT lung cancer screening results of Lung Rads 2 finding with recommendation for 12 month follow up imaging. Also notified of incidental finding noted below. Patient verbalizes understanding.   IMPRESSION: 1. Lung-RADS Category 2, benign appearance or behavior. Continue annual screening with low-dose chest CT without contrast in 12 months. 2. Mild centrilobular and paraseptal emphysema with basilar predominant patchy ground-glass and subpleural reticulation, likely stable from prior exams. 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 3. Coronary artery calcification. 4. Cirrhosis.

## 2014-11-30 ENCOUNTER — Ambulatory Visit: Payer: No Typology Code available for payment source

## 2014-12-02 ENCOUNTER — Ambulatory Visit
Admission: RE | Admit: 2014-12-02 | Discharge: 2014-12-02 | Disposition: A | Discharge status: Home / Self Care | Payer: No Typology Code available for payment source | Source: Ambulatory Visit | Attending: Internal Medicine | Admitting: Internal Medicine

## 2014-12-02 DIAGNOSIS — Z1231 Encounter for screening mammogram for malignant neoplasm of breast: Secondary | ICD-10-CM | POA: Insufficient documentation

## 2014-12-03 ENCOUNTER — Other Ambulatory Visit: Payer: Self-pay | Admitting: Gastroenterology

## 2014-12-03 DIAGNOSIS — K7460 Unspecified cirrhosis of liver: Secondary | ICD-10-CM

## 2014-12-08 ENCOUNTER — Ambulatory Visit
Admission: RE | Admit: 2014-12-08 | Discharge: 2014-12-08 | Disposition: A | Discharge status: Home / Self Care | Payer: No Typology Code available for payment source | Source: Ambulatory Visit | Attending: Gastroenterology | Admitting: Gastroenterology

## 2014-12-08 DIAGNOSIS — K7460 Unspecified cirrhosis of liver: Secondary | ICD-10-CM | POA: Insufficient documentation

## 2014-12-25 ENCOUNTER — Other Ambulatory Visit: Payer: Self-pay | Admitting: Internal Medicine

## 2014-12-25 DIAGNOSIS — R519 Headache, unspecified: Secondary | ICD-10-CM

## 2014-12-25 DIAGNOSIS — R51 Headache: Principal | ICD-10-CM

## 2014-12-30 ENCOUNTER — Ambulatory Visit
Admission: RE | Admit: 2014-12-30 | Discharge: 2014-12-30 | Disposition: A | Discharge status: Home / Self Care | Payer: No Typology Code available for payment source | Source: Ambulatory Visit | Attending: Internal Medicine | Admitting: Internal Medicine

## 2014-12-30 DIAGNOSIS — I671 Cerebral aneurysm, nonruptured: Secondary | ICD-10-CM | POA: Insufficient documentation

## 2014-12-30 DIAGNOSIS — R51 Headache: Secondary | ICD-10-CM | POA: Insufficient documentation

## 2014-12-30 DIAGNOSIS — R519 Headache, unspecified: Secondary | ICD-10-CM

## 2014-12-31 ENCOUNTER — Ambulatory Visit: Payer: No Typology Code available for payment source

## 2015-01-01 ENCOUNTER — Ambulatory Visit: Payer: No Typology Code available for payment source

## 2015-01-04 ENCOUNTER — Encounter: Payer: Self-pay | Admitting: Emergency Medicine

## 2015-01-04 ENCOUNTER — Emergency Department
Admission: EM | Admit: 2015-01-04 | Discharge: 2015-01-04 | Disposition: A | Discharge status: Home / Self Care | Payer: BLUE CROSS/BLUE SHIELD | Attending: Emergency Medicine | Admitting: Emergency Medicine

## 2015-01-04 DIAGNOSIS — R51 Headache: Secondary | ICD-10-CM | POA: Insufficient documentation

## 2015-01-04 DIAGNOSIS — H53149 Visual discomfort, unspecified: Secondary | ICD-10-CM | POA: Insufficient documentation

## 2015-01-04 DIAGNOSIS — F17210 Nicotine dependence, cigarettes, uncomplicated: Secondary | ICD-10-CM | POA: Insufficient documentation

## 2015-01-04 DIAGNOSIS — R110 Nausea: Secondary | ICD-10-CM | POA: Insufficient documentation

## 2015-01-04 DIAGNOSIS — J449 Chronic obstructive pulmonary disease, unspecified: Secondary | ICD-10-CM | POA: Insufficient documentation

## 2015-01-04 DIAGNOSIS — E119 Type 2 diabetes mellitus without complications: Secondary | ICD-10-CM | POA: Diagnosis not present

## 2015-01-04 DIAGNOSIS — Z880 Allergy status to penicillin: Secondary | ICD-10-CM | POA: Insufficient documentation

## 2015-01-04 DIAGNOSIS — I10 Essential (primary) hypertension: Secondary | ICD-10-CM | POA: Insufficient documentation

## 2015-01-04 DIAGNOSIS — G8929 Other chronic pain: Secondary | ICD-10-CM | POA: Insufficient documentation

## 2015-01-04 DIAGNOSIS — R519 Headache, unspecified: Secondary | ICD-10-CM

## 2015-01-04 HISTORY — DX: Unspecified cirrhosis of liver: K74.60

## 2015-01-04 HISTORY — DX: Type 2 diabetes mellitus without complications: E11.9

## 2015-01-04 HISTORY — DX: Essential (primary) hypertension: I10

## 2015-01-04 HISTORY — DX: Chronic obstructive pulmonary disease, unspecified: J44.9

## 2015-01-04 MED ORDER — KETOROLAC TROMETHAMINE 30 MG/ML IJ SOLN
30.0000 mg | Freq: Once | INTRAMUSCULAR | Status: AC
  Administered 2015-01-04: 30 mg via INTRAVENOUS
  Filled 2015-01-04: qty 1

## 2015-01-04 MED ORDER — METOCLOPRAMIDE HCL 5 MG/ML IJ SOLN
10.0000 mg | Freq: Once | INTRAMUSCULAR | Status: AC
  Administered 2015-01-04: 10 mg via INTRAVENOUS
  Filled 2015-01-04: qty 2

## 2015-01-04 MED ORDER — HYDROMORPHONE HCL 1 MG/ML IJ SOLN
0.5000 mg | Freq: Once | INTRAMUSCULAR | Status: AC
  Administered 2015-01-04: 0.5 mg via INTRAVENOUS
  Filled 2015-01-04: qty 1

## 2015-01-04 MED ORDER — DIPHENHYDRAMINE HCL 50 MG/ML IJ SOLN
25.0000 mg | Freq: Once | INTRAMUSCULAR | Status: AC
  Administered 2015-01-04: 25 mg via INTRAVENOUS
  Filled 2015-01-04: qty 1

## 2015-01-04 MED ORDER — SODIUM CHLORIDE 0.9 % IV BOLUS (SEPSIS)
1000.0000 mL | Freq: Once | INTRAVENOUS | Status: AC
  Administered 2015-01-04: 1000 mL via INTRAVENOUS

## 2015-01-04 NOTE — ED Notes (Addendum)
Pt reports headache x2 weeks; saw PCP and prescribed antibiotics, nasal spray and prednisone with no relief. Pt reports pain to top of head, reports increased pain with cough. Pt states "it feels like a mylogram headache". Pt alert and oriented in triage, no distress noted. Pt reports having CT scan last week with normal results last week.

## 2015-01-04 NOTE — ED Provider Notes (Signed)
[facility] Emergency Department Provider Note  ____________________________________________  Time seen: Approximately 3:23 PM  I have reviewed the triage vital signs and the nursing notes.   HISTORY  Chief Complaint Headache   HPI Vanessa Oneill is a 65 y.o. female is here with complaint of frontal headache for 2 weeks. Patient states she saw her primary care doctor and was prescribed antibiotics, nose spray and prednisone all without any relief. Patient states pain is in the top of her head increases with cough. She states that she also had a CT scan of her head in which she was reported to have normal results. Patient voices that she does have some photophobia and nausea but no vomiting.Patient states that the headache begins at the forehead and goes to the top of her head. Pain increases with coughing. She rates her pain as a 10 out of 10.   Past Medical History  Diagnosis Date  . Personal history of tobacco use, presenting hazards to health 11/12/2014  . Diabetes mellitus without complication ([place])   . Hypertension   . 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[place])   . COPD (chronic obstructive pulmonary disease) [facility])     Patient Active Problem List   Diagnosis Date Noted  . Personal history of tobacco use, presenting hazards to health 11/12/2014    Past Surgical History  Procedure Laterality Date  . Breast biopsy Right     neg  . Cholecystectomy    . Abdominal hysterectomy      No current outpatient prescriptions on file.  Allergies Tylenol and Amoxicillin  Family History  Problem Relation Age of Onset  . Lung cancer Father     Social History Social History  Substance Use Topics  . Smoking status: Current Every Day Smoker -- 1.00 packs/day for 48 years    Types: Cigarettes  . Smokeless tobacco: None  . Alcohol Use: No    Review of Systems Constitutional: No fever/chills Eyes: Positive photophobia ENT: No sore throat. Cardiovascular:  Denies chest pain. Respiratory: Denies shortness of breath. Gastrointestinal: No abdominal pain. Positive nausea, no vomiting.  No diarrhea.  No constipation. Genitourinary: Negative for dysuria. Musculoskeletal: Negative for back pain. Skin: Negative for rash. Neurological: Positive for headaches 2 weeks, no focal weakness or numbness.  10-point ROS otherwise negative.  ____________________________________________   PHYSICAL EXAM:  VITAL SIGNS: ED Triage Vitals  Enc Vitals Group     BP 01/04/15 1437 145/77 mmHg     Pulse Rate 01/04/15 1437 75     Resp 01/04/15 1437 16     Temp 01/04/15 1437 97.5 F (36.4 C)     Temp Source 01/04/15 1437 Oral     SpO2 01/04/15 1437 96 %     Weight 01/04/15 1437 198 lb (89.812 kg)     Height 01/04/15 1437 5\' 7"  (1.702 m)     Head Cir --      Peak Flow --      Pain Score 01/04/15 1438 10     Pain Loc --      Pain Edu? --      Excl. in [place]? --     Constitutional: Alert and oriented. Well appearing and in no acute distress. Eyes: Conjunctivae are normal. PERRL. EOMI. Head: Atraumatic. Nose: No congestion/rhinnorhea.  EACs and TMs are clear bilaterally. Mouth/Throat: Mucous membranes are moist.  Oropharynx non-erythematous. Neck: No stridor.  Supple. No cervical tenderness on palpation posteriorly. Hematological/Lymphatic/Immunilogical: No cervical lymphadenopathy. Cardiovascular: Normal rate, regular rhythm. Grossly normal heart sounds.  Good peripheral circulation. Respiratory: Normal respiratory effort.  No retractions. Lungs CTAB. Gastrointestinal: Soft and nontender. No distention.  Musculoskeletal: His upper and lower extremities without difficulty. Normal gait was noted in the ER. Neurologic:  Normal speech and language. No gross focal neurologic deficits are appreciated. No gait instability. Skin:  Skin is warm, dry and intact. No rash noted. Psychiatric: Mood and affect are normal. Speech and behavior are  normal.  ____________________________________________   LABS (all labs ordered are listed, but only abnormal results are displayed)  Labs Reviewed - No data to display   PROCEDURES  Procedure(s) performed: None  Critical Care performed: No  ____________________________________________   INITIAL IMPRESSION / ASSESSMENT AND PLAN / ED COURSE  Pertinent labs & imaging results that were available during my care of the patient were reviewed by me and considered in my medical decision making (see chart for details).   CT scan was reviewed and was negative. CT was performed on 12/30/14. ----------------------------------------- 5:12 PM on 01/04/2015 ----------------------------------------- Patient states she is pain-free and was ambulatory to the restroom without assistance. Patient states she bent over and there is no headache. Patient was discharged to continue taking her regular medication and follow-up with her doctor if any continued problems.  ____________________________________________   FINAL CLINICAL IMPRESSION(S) / ED DIAGNOSES  Final diagnoses:  Chronic nonintractable headache, unspecified headache type      Johnn Hai, PA-C 01/04/15 1718  Lavonia Drafts, MD 01/10/15 1227

## 2015-01-04 NOTE — Discharge Instructions (Signed)
General Headache Without Cause A headache is pain or discomfort felt around the head or neck area. There are many causes and types of headaches. In some cases, the cause may not be found.  HOME CARE  Managing Pain  Take over-the-counter and prescription medicines only as told by your doctor.  Lie down in a dark, quiet room when you have a headache.  If directed, apply ice to the head and neck area:  Put ice in a plastic bag.  Place a towel between your skin and the bag.  Leave the ice on for 20 minutes, 2-3 times per day.  Use a heating pad or hot shower to apply heat to the head and neck area as told by your doctor.  Keep lights dim if bright lights bother you or make your headaches worse. Eating and Drinking  Eat meals on a regular schedule.  Lessen how much alcohol you drink.  Lessen how much caffeine you drink, or stop drinking caffeine. General Instructions  Keep all follow-up visits as told by your doctor. This is important.  Keep a journal to find out if certain things bring on headaches. For example, write down:  What you eat and drink.  How much sleep you get.  Any change to your diet or medicines.  Relax by getting a massage or doing other relaxing activities.  Lessen stress.  Sit up straight. Do not tighten (tense) your muscles.  Do not use tobacco products. This includes cigarettes, chewing tobacco, or e-cigarettes. If you need help quitting, ask your doctor.  Exercise regularly as told by your doctor.  Get enough sleep. This often means 7-9 hours of sleep. GET HELP IF:  Your symptoms are not helped by medicine.  You have a headache that feels different than the other headaches.  You feel sick to your stomach (nauseous) or you throw up (vomit).  You have a fever. GET HELP RIGHT AWAY IF:   Your headache becomes really bad.  You keep throwing up.  You have a stiff neck.  You have trouble seeing.  You have trouble speaking.  You have  pain in the eye or ear.  Your muscles are weak or you lose muscle control.  You lose your balance or have trouble walking.  You feel like you will pass out (faint) or you pass out.  You have confusion.   This information is not intended to replace advice given to you by your health care provider. Make sure you discuss any questions you have with your health care provider.   Document Released: 09/28/2007 Document Revised: 09/09/2014 Document Reviewed: 04/13/2014 Elsevier Interactive Patient Education 2016 [place] with your doctor or [facility] if any continued problems. Continued to drink plenty of fluids. Continue with the regular medication.

## 2015-01-06 ENCOUNTER — Other Ambulatory Visit: Payer: Self-pay | Admitting: Internal Medicine

## 2015-01-06 DIAGNOSIS — R51 Headache: Principal | ICD-10-CM

## 2015-01-06 DIAGNOSIS — I729 Aneurysm of unspecified site: Secondary | ICD-10-CM

## 2015-01-06 DIAGNOSIS — G8929 Other chronic pain: Secondary | ICD-10-CM

## 2015-01-13 ENCOUNTER — Ambulatory Visit: Payer: BLUE CROSS/BLUE SHIELD

## 2015-01-15 ENCOUNTER — Ambulatory Visit: Payer: BLUE CROSS/BLUE SHIELD

## 2015-01-19 ENCOUNTER — Other Ambulatory Visit: Payer: Self-pay | Admitting: Neurology

## 2015-01-19 DIAGNOSIS — R51 Headache: Principal | ICD-10-CM

## 2015-01-19 DIAGNOSIS — R519 Headache, unspecified: Secondary | ICD-10-CM

## 2015-01-19 DIAGNOSIS — G4486 Cervicogenic headache: Secondary | ICD-10-CM

## 2015-01-27 ENCOUNTER — Ambulatory Visit
Admission: RE | Admit: 2015-01-27 | Discharge: 2015-01-27 | Disposition: A | Discharge status: Home / Self Care | Payer: BLUE CROSS/BLUE SHIELD | Source: Ambulatory Visit | Attending: Neurology | Admitting: Neurology

## 2015-01-27 DIAGNOSIS — R51 Headache: Secondary | ICD-10-CM | POA: Diagnosis present

## 2015-01-27 DIAGNOSIS — K7460 Unspecified cirrhosis of liver: Secondary | ICD-10-CM | POA: Diagnosis not present

## 2015-01-27 DIAGNOSIS — R519 Headache, unspecified: Secondary | ICD-10-CM

## 2015-01-27 DIAGNOSIS — G4486 Cervicogenic headache: Secondary | ICD-10-CM

## 2015-01-27 DIAGNOSIS — M4802 Spinal stenosis, cervical region: Secondary | ICD-10-CM | POA: Insufficient documentation

## 2015-01-27 MED ORDER — GADOBENATE DIMEGLUMINE 529 MG/ML IV SOLN
20.0000 mL | Freq: Once | INTRAVENOUS | Status: AC | PRN
  Administered 2015-01-27: 18 mL via INTRAVENOUS

## 2015-01-28 ENCOUNTER — Encounter (HOSPITAL_COMMUNITY): Payer: Self-pay | Admitting: *Deleted

## 2015-01-28 ENCOUNTER — Emergency Department (HOSPITAL_COMMUNITY)
Admission: EM | Admit: 2015-01-28 | Discharge: 2015-01-28 | Disposition: A | Discharge status: Home / Self Care | Payer: BLUE CROSS/BLUE SHIELD | Attending: Emergency Medicine | Admitting: Emergency Medicine

## 2015-01-28 DIAGNOSIS — F17210 Nicotine dependence, cigarettes, uncomplicated: Secondary | ICD-10-CM | POA: Diagnosis not present

## 2015-01-28 DIAGNOSIS — H538 Other visual disturbances: Secondary | ICD-10-CM | POA: Diagnosis not present

## 2015-01-28 DIAGNOSIS — J449 Chronic obstructive pulmonary disease, unspecified: Secondary | ICD-10-CM | POA: Diagnosis not present

## 2015-01-28 DIAGNOSIS — H53149 Visual discomfort, unspecified: Secondary | ICD-10-CM | POA: Diagnosis not present

## 2015-01-28 DIAGNOSIS — R51 Headache: Secondary | ICD-10-CM | POA: Insufficient documentation

## 2015-01-28 DIAGNOSIS — Z79899 Other long term (current) drug therapy: Secondary | ICD-10-CM | POA: Diagnosis not present

## 2015-01-28 DIAGNOSIS — Z8719 Personal history of other diseases of the digestive system: Secondary | ICD-10-CM | POA: Insufficient documentation

## 2015-01-28 DIAGNOSIS — R519 Headache, unspecified: Secondary | ICD-10-CM

## 2015-01-28 DIAGNOSIS — Z794 Long term (current) use of insulin: Secondary | ICD-10-CM | POA: Diagnosis not present

## 2015-01-28 DIAGNOSIS — I10 Essential (primary) hypertension: Secondary | ICD-10-CM | POA: Insufficient documentation

## 2015-01-28 DIAGNOSIS — E119 Type 2 diabetes mellitus without complications: Secondary | ICD-10-CM | POA: Diagnosis not present

## 2015-01-28 DIAGNOSIS — Z880 Allergy status to penicillin: Secondary | ICD-10-CM | POA: Diagnosis not present

## 2015-01-28 MED ORDER — METOCLOPRAMIDE HCL 5 MG/ML IJ SOLN
10.0000 mg | Freq: Once | INTRAMUSCULAR | Status: AC
  Administered 2015-01-28: 10 mg via INTRAVENOUS
  Filled 2015-01-28: qty 2

## 2015-01-28 MED ORDER — SODIUM CHLORIDE 0.9 % IV BOLUS (SEPSIS)
1000.0000 mL | Freq: Once | INTRAVENOUS | Status: AC
  Administered 2015-01-28: 1000 mL via INTRAVENOUS

## 2015-01-28 MED ORDER — DIPHENHYDRAMINE HCL 50 MG/ML IJ SOLN
25.0000 mg | Freq: Once | INTRAMUSCULAR | Status: AC
  Administered 2015-01-28: 25 mg via INTRAVENOUS
  Filled 2015-01-28: qty 1

## 2015-01-28 MED ORDER — OXYCODONE-ACETAMINOPHEN 5-325 MG PO TABS
ORAL_TABLET | ORAL | Status: AC
  Filled 2015-01-28: qty 1

## 2015-01-28 MED ORDER — DEXAMETHASONE SODIUM PHOSPHATE 10 MG/ML IJ SOLN
10.0000 mg | Freq: Once | INTRAMUSCULAR | Status: AC
  Administered 2015-01-28: 10 mg via INTRAVENOUS
  Filled 2015-01-28: qty 1

## 2015-01-28 MED ORDER — OXYCODONE-ACETAMINOPHEN 5-325 MG PO TABS
1.0000 | ORAL_TABLET | Freq: Once | ORAL | Status: AC
  Administered 2015-01-28: 1 via ORAL

## 2015-01-28 MED ORDER — SUMATRIPTAN SUCCINATE 50 MG PO TABS: 50.0000 mg | ORAL_TABLET | ORAL | Status: DC | PRN

## 2015-01-28 NOTE — ED Notes (Signed)
Patient verbalized understanding of discharge instructions and denies any further needs or questions at this time. VS stable. Patient ambulatory with steady gait.  

## 2015-01-28 NOTE — ED Notes (Signed)
Pt reports intermittent migraine for since December. p states that she has had CT scans and MRI. Pt states that she was sent here for further eval and to see if she has a csf leak.

## 2015-01-28 NOTE — Discharge Instructions (Signed)
General Headache Without Cause A headache is pain or discomfort felt around the head or neck area. There are many causes and types of headaches. In some cases, the cause may not be found.  HOME CARE  Managing Pain  Take over-the-counter and prescription medicines only as told by your doctor.  Lie down in a dark, quiet room when you have a headache.  If directed, apply ice to the head and neck area:  Put ice in a plastic bag.  Place a towel between your skin and the bag.  Leave the ice on for 20 minutes, 2-3 times per day.  Use a heating pad or hot shower to apply heat to the head and neck area as told by your doctor.  Keep lights dim if bright lights bother you or make your headaches worse. Eating and Drinking  Eat meals on a regular schedule.  Lessen how much alcohol you drink.  Lessen how much caffeine you drink, or stop drinking caffeine. General Instructions  Keep all follow-up visits as told by your doctor. This is important.  Keep a journal to find out if certain things bring on headaches. For example, write down:  What you eat and drink.  How much sleep you get.  Any change to your diet or medicines.  Relax by getting a massage or doing other relaxing activities.  Lessen stress.  Sit up straight. Do not tighten (tense) your muscles.  Do not use tobacco products. This includes cigarettes, chewing tobacco, or e-cigarettes. If you need help quitting, ask your doctor.  Exercise regularly as told by your doctor.  Get enough sleep. This often means 7-9 hours of sleep. GET HELP IF:  Your symptoms are not helped by medicine.  You have a headache that feels different than the other headaches.  You feel sick to your stomach (nauseous) or you throw up (vomit).  You have a fever. GET HELP RIGHT AWAY IF:   Your headache becomes really bad.  You keep throwing up.  You have a stiff neck.  You have trouble seeing.  You have trouble speaking.  You have  pain in the eye or ear.  Your muscles are weak or you lose muscle control.  You lose your balance or have trouble walking.  You feel like you will pass out (faint) or you pass out.  You have confusion.   This information is not intended to replace advice given to you by your health care provider. Make sure you discuss any questions you have with your health care provider.   Document Released: 09/28/2007 Document Revised: 09/09/2014 Document Reviewed: 04/13/2014 Elsevier Interactive Patient Education 2016 [employer].  Migraine Headache A migraine headache is very bad, throbbing pain on one or both sides of your head. Talk to your doctor about what things may bring on (trigger) your migraine headaches. HOME CARE  Only take medicines as told by your doctor.  Lie down in a dark, quiet room when you have a migraine.  Keep a journal to find out if certain things bring on migraine headaches. For example, write down:  What you eat and drink.  How much sleep you get.  Any change to your diet or medicines.  Lessen how much alcohol you drink.  Quit smoking if you smoke.  Get enough sleep.  Lessen any stress in your life.  Keep lights dim if bright lights bother you or make your migraines worse. GET HELP RIGHT AWAY IF:   Your migraine becomes really bad.  You have a fever.  You have a stiff neck.  You have trouble seeing.  Your muscles are weak, or you lose muscle control.  You lose your balance or have trouble walking.  You feel like you will pass out (faint), or you pass out.  You have really bad symptoms that are different than your first symptoms. MAKE SURE YOU:   Understand these instructions.  Will watch your condition.  Will get help right away if you are not doing well or get worse.   This information is not intended to replace advice given to you by your health care provider. Make sure you discuss any questions you have with your health care  provider.   Document Released: 09/28/2007 Document Revised: 03/13/2011 Document Reviewed: 08/26/2012 Elsevier Interactive Patient Education [employer].

## 2015-01-28 NOTE — ED Provider Notes (Signed)
CSN: [id number]     Arrival date & time 01/28/15  1527 History   First MD Initiated Contact with Patient 01/28/15 1832     Chief Complaint  Patient presents with  . Migraine     (Consider location/radiation/quality/duration/timing/severity/associated sxs/prior Treatment) Patient is a 65 y.o. female presenting with headaches. The history is provided by the patient.  Headache Pain location:  Frontal and occipital Quality:  Sharp and dull (pounding) Radiates to:  Does not radiate Severity currently:  6/10 Severity at highest:  8/10 Onset quality:  Gradual Duration: since mid december. Timing:  Constant Progression:  Waxing and waning Chronicity:  Chronic Similar to prior headaches: yes   Context: not coughing   Relieved by: toradol. Worsened by:  Sound and light Ineffective treatments:  None tried Associated symptoms: blurred vision (generalized prior to headache) and photophobia   Associated symptoms: no abdominal pain, no congestion, no cough, no diarrhea, no dizziness, no fever, no focal weakness, no loss of balance, no nausea, no near-syncope, no neck stiffness, no paresthesias, no vomiting and no weakness     Past Medical History  Diagnosis Date  . Personal history of tobacco use, presenting hazards to health 11/12/2014  . Diabetes mellitus without complication ([place])   . Hypertension   . 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 COPD (chronic obstructive pulmonary disease) [facility])    Past Surgical History  Procedure Laterality Date  . Breast biopsy Right     neg  . Cholecystectomy    . Abdominal hysterectomy     Family History  Problem Relation Age of Onset  . Lung cancer Father    Social History  Substance Use Topics  . Smoking status: Current Every Day Smoker -- 1.00 packs/day for 48 years    Types: Cigarettes  . Smokeless tobacco: None  . Alcohol Use: No   OB History    No data available     Review of Systems  Constitutional: Negative for fever.  HENT:  Negative for congestion.   Eyes: Positive for blurred vision (generalized prior to headache) and photophobia.  Respiratory: Negative for cough and shortness of breath.   Cardiovascular: Negative for chest pain and near-syncope.  Gastrointestinal: Negative for nausea, vomiting, abdominal pain and diarrhea.  Genitourinary: Negative.   Musculoskeletal: Negative for neck stiffness.  Skin: Negative.   Neurological: Positive for headaches. Negative for dizziness, focal weakness, syncope, facial asymmetry, weakness, paresthesias and loss of balance.      Allergies  Tylenol and Amoxicillin  Home Medications   Prior to Admission medications   Medication Sig Start Date End Date Taking? Authorizing Provider  gabapentin (NEURONTIN) 100 MG capsule Take 100 mg by mouth 2 (two) times daily.   Yes Historical Provider, MD  glimepiride (AMARYL) 4 MG tablet Take 4 mg by mouth daily with breakfast.   Yes Historical Provider, MD  HYDROcodone-acetaminophen (NORCO/VICODIN) 5-325 MG tablet Take 1 tablet by mouth every 6 (six) hours as needed for moderate pain.   Yes Historical Provider, MD  insulin NPH Human (HUMULIN N,NOVOLIN N) 100 UNIT/ML injection Inject 26 Units into the skin at bedtime.   Yes Historical Provider, MD  insulin regular (NOVOLIN R,HUMULIN R) 100 units/mL injection Inject 22 Units into the skin 3 (three) times daily before meals.   Yes Historical Provider, MD  losartan (COZAAR) 100 MG tablet Take 100 mg by mouth daily.   Yes Historical Provider, MD  nadolol (CORGARD) 80 MG tablet Take 80 mg by mouth daily.   Yes Historical Provider,  MD  pantoprazole (PROTONIX) 40 MG tablet Take 40 mg by mouth daily.   Yes Historical Provider, MD  rOPINIRole (REQUIP) 1 MG tablet Take 1 mg by mouth 3 (three) times daily.   Yes Historical Provider, MD  SUMAtriptan (IMITREX) 50 MG tablet Take 1 tablet (50 mg total) by mouth every 2 (two) hours as needed for migraine. May repeat in 2 hours if headache persists or  recurs. 01/28/15   Heriberto Antigua, MD   BP 142/81 mmHg  Pulse 71  Temp(Src) 98.3 F (36.8 C) (Oral)  Resp 16  SpO2 100% Physical Exam  Constitutional: She is oriented to person, place, and time. She appears well-developed and well-nourished. No distress.  HENT:  Head: Normocephalic and atraumatic.  Mouth/Throat: No oropharyngeal exudate.  Eyes: Conjunctivae are normal. Pupils are equal, round, and reactive to light. No scleral icterus.  Neck: Normal range of motion. Neck supple.  Cardiovascular: Normal rate, regular rhythm, normal heart sounds and intact distal pulses.  Exam reveals no gallop and no friction rub.   No murmur heard. Pulmonary/Chest: Effort normal. No respiratory distress. She has no wheezes. She has no rales. She exhibits no tenderness.  Abdominal: She exhibits no distension. There is no tenderness. There is no rebound and no guarding.  Musculoskeletal: Normal range of motion. She exhibits no edema or tenderness.  Neurological: She is alert and oriented to person, place, and time. No cranial nerve deficit. She exhibits normal muscle tone. Coordination normal.  Skin: Skin is warm and dry. No rash noted. She is not diaphoretic. No erythema. No pallor.  Psychiatric: She has a normal mood and affect.  Nursing note and vitals reviewed.   ED Course  Procedures (including critical care time) Labs Review Labs Reviewed - No data to display  Imaging Review Mr Kizzie Fantasia Contrast  01/27/2015  CLINICAL DATA:  Chronic daily headaches since mid December. Minor blurry vision sometimes and dizziness. Patient's sister with history of brain cancer. Nonalcoholic cirrhosis. EXAM: MRI HEAD WITHOUT AND WITH CONTRAST TECHNIQUE: Multiplanar, multiecho pulse sequences of the brain and surrounding structures were obtained without and with intravenous contrast. CONTRAST:  51mL MULTIHANCE GADOBENATE DIMEGLUMINE 529 MG/ML IV SOLN COMPARISON:  Head CT 12/30/2014 and MRI 12/22/2011 FINDINGS: There is  no evidence of acute infarct, intracranial hemorrhage, mass, midline shift, or extra-axial fluid collection. Ventricles and sulci are normal. A few punctate foci of T2 hyperintensity are again seen in the cerebral white matter with mild T2 hyperintensity in the pons, nonspecific but compatible with minimal chronic small vessel ischemic disease. A small right parietal developmental venous anomaly is unchanged. No abnormal enhancement is identified elsewhere. There is increased intrinsic T1 hyperintensity in the globi pallidi. Orbits are unremarkable. Paranasal sinuses and mastoid air cells are clear. Major intracranial vascular flow voids are preserved, with the left vertebral artery being dominant. IMPRESSION: 1. No acute intracranial abnormality or mass. 2. Minimal chronic small vessel ischemic disease. 3. Increased T1 signal in the basal ganglia consistent with patient's history of cirrhosis. Electronically Signed   By: Logan Bores M.D.   On: 01/27/2015 16:15   Mr Cervical Spine Wo Contrast  01/27/2015  CLINICAL DATA:  Cervicogenic headache. Intracranial hypertension. Neck stiffness. Headaches for 1 month. EXAM: MRI CERVICAL SPINE WITHOUT CONTRAST TECHNIQUE: Multiplanar, multisequence MR imaging of the cervical spine was performed. No intravenous contrast was administered. COMPARISON:  None. FINDINGS: There is slight reversal of the normal cervical lordosis. Trace anterolisthesis is noted of C3 on C4, likely facet mediated. Vertebral  body heights are preserved. Degenerative changes are noted at C1-2, advanced on the right with associated mild marrow edema. Disc space narrowing is mild at C4-5 and moderate at C5-6. The cervical spinal cord is normal in caliber and signal. Paraspinal soft tissues are unremarkable. C2-3:  Negative. C3-4: Disc bulging, mild uncovertebral spurring, and severe left facet arthrosis result in mild left neural foraminal stenosis without spinal stenosis. C4-5:  Mild disc bulging without  stenosis. C5-6: Broad-based posterior disc osteophyte complex without stenosis. C6-7:  Small central disc protrusion without stenosis. C7-T1:  Negative. IMPRESSION: 1. Severe C1-2 degenerative changes on the right. 2. Severe left facet arthrosis at C3-4 with mild left neural foraminal stenosis. 3. Mild cervical disc degeneration elsewhere without stenosis. Electronically Signed   By: Logan Bores M.D.   On: 01/27/2015 14:40   I have personally reviewed and evaluated these images and lab results as part of my medical decision-making.   EKG Interpretation None      MDM   Final diagnoses:  Nonintractable episodic headache, unspecified headache type    Pt is a 64yoF here with headache as above. States it has been ongoing in waxing/waning patterned since December. History of migraines. Seen by neurology recently and had a MRI brain w and wo as well as mr cspine without findings to explain headaches. Has blurry vision as aura. She was told by her neurology provider that she could have a csf leak but no history of LP or neurologic manipulation and MRIs yesterday without finding consistent of such. Further history and exam as above. VSS and reassuring physical exam without neuro finding.  Patient given a migraine cocktail with good improvement in pain. Ambulatory and asking for food and drink. She is upset about her time spent in the ED and repeatedly asked why she has to wait so long. I have explained the nature of the ER with her and apologized for the wait time.  Likely her chronic headaches. Doubt ICH, tumor, or other severe process given MRIs without acute findings within the past few days.  I have reviewed all notes and imaging. Patient stable for discharge home.  I have reviewed all results with the patient. Advised to f/u with neurology within 7 days. Will rx a triptan to trial. Patient agrees to stated plan. All questions answered. Advised to call or return to have any questions, new symptoms,  change in symptoms, or symptoms that they do not understand.    Heriberto Antigua, MD 01/29/15 [place], MD 01/30/15 [phone]

## 2015-01-28 NOTE — ED Notes (Signed)
Patient called out, stating that her pain is better and she is wanting to get out of here. MD made aware.

## 2015-02-02 ENCOUNTER — Other Ambulatory Visit: Payer: Self-pay | Admitting: Internal Medicine

## 2015-02-02 DIAGNOSIS — R519 Headache, unspecified: Secondary | ICD-10-CM

## 2015-02-02 DIAGNOSIS — M546 Pain in thoracic spine: Principal | ICD-10-CM

## 2015-02-02 DIAGNOSIS — M545 Low back pain, unspecified: Secondary | ICD-10-CM

## 2015-02-02 DIAGNOSIS — M542 Cervicalgia: Secondary | ICD-10-CM

## 2015-02-02 DIAGNOSIS — G8929 Other chronic pain: Secondary | ICD-10-CM

## 2015-02-02 DIAGNOSIS — R51 Headache: Principal | ICD-10-CM

## 2015-02-08 ENCOUNTER — Ambulatory Visit: Payer: BLUE CROSS/BLUE SHIELD

## 2015-02-23 ENCOUNTER — Ambulatory Visit
Admission: RE | Admit: 2015-02-23 | Discharge: 2015-02-23 | Disposition: A | Discharge status: Home / Self Care | Payer: BLUE CROSS/BLUE SHIELD | Source: Ambulatory Visit | Attending: Internal Medicine | Admitting: Internal Medicine

## 2015-02-23 DIAGNOSIS — M5134 Other intervertebral disc degeneration, thoracic region: Secondary | ICD-10-CM | POA: Insufficient documentation

## 2015-02-23 DIAGNOSIS — G8929 Other chronic pain: Secondary | ICD-10-CM | POA: Diagnosis present

## 2015-02-23 DIAGNOSIS — M546 Pain in thoracic spine: Principal | ICD-10-CM

## 2015-02-23 DIAGNOSIS — M47816 Spondylosis without myelopathy or radiculopathy, lumbar region: Secondary | ICD-10-CM | POA: Insufficient documentation

## 2015-02-23 DIAGNOSIS — M545 Low back pain, unspecified: Secondary | ICD-10-CM

## 2015-09-21 ENCOUNTER — Other Ambulatory Visit: Payer: Self-pay | Admitting: Internal Medicine

## 2015-09-22 ENCOUNTER — Other Ambulatory Visit: Payer: Self-pay | Admitting: Internal Medicine

## 2015-09-22 DIAGNOSIS — Z1231 Encounter for screening mammogram for malignant neoplasm of breast: Secondary | ICD-10-CM

## 2015-11-09 ENCOUNTER — Telehealth: Payer: Self-pay | Admitting: *Deleted

## 2015-11-09 NOTE — Telephone Encounter (Signed)
Notified patient that annual lung cancer screening low dose CT scan is due. Confirmed that patient is within the age range of 55-77, and asymptomatic, (no signs or symptoms of lung cancer). Patient denies illness that would prevent curative treatment for lung cancer if found. The patient is a current smoker, with a 61 pack year history. The shared decision making visit was done 11/13/14. Patient is agreeable for CT scan being scheduled pending insurance approval.

## 2015-12-06 ENCOUNTER — Ambulatory Visit
Admission: RE | Admit: 2015-12-06 | Discharge: 2015-12-06 | Disposition: A | Discharge status: Home / Self Care | Payer: BLUE CROSS/BLUE SHIELD | Source: Ambulatory Visit | Attending: Internal Medicine | Admitting: Internal Medicine

## 2015-12-06 DIAGNOSIS — Z1231 Encounter for screening mammogram for malignant neoplasm of breast: Secondary | ICD-10-CM | POA: Insufficient documentation

## 2015-12-30 ENCOUNTER — Other Ambulatory Visit: Payer: Self-pay | Admitting: *Deleted

## 2015-12-30 DIAGNOSIS — Z87891 Personal history of nicotine dependence: Secondary | ICD-10-CM

## 2016-01-05 ENCOUNTER — Other Ambulatory Visit: Payer: Self-pay | Admitting: Neurology

## 2016-01-05 DIAGNOSIS — R519 Headache, unspecified: Secondary | ICD-10-CM

## 2016-01-05 DIAGNOSIS — R51 Headache: Principal | ICD-10-CM

## 2016-01-13 ENCOUNTER — Ambulatory Visit
Admission: RE | Admit: 2016-01-13 | Discharge: 2016-01-13 | Disposition: A | Discharge status: Home / Self Care | Payer: BLUE CROSS/BLUE SHIELD | Source: Ambulatory Visit | Attending: Neurology | Admitting: Neurology

## 2016-01-13 DIAGNOSIS — M1288 Other specific arthropathies, not elsewhere classified, other specified site: Secondary | ICD-10-CM | POA: Insufficient documentation

## 2016-01-13 DIAGNOSIS — R51 Headache: Secondary | ICD-10-CM | POA: Diagnosis present

## 2016-01-13 DIAGNOSIS — R519 Headache, unspecified: Secondary | ICD-10-CM

## 2016-01-13 MED ORDER — GADOBENATE DIMEGLUMINE 529 MG/ML IV SOLN
18.0000 mL | Freq: Once | INTRAVENOUS | Status: AC | PRN
  Administered 2016-01-13: 18 mL via INTRAVENOUS

## 2016-01-17 ENCOUNTER — Ambulatory Visit: Admission: RE | Admit: 2016-01-17 | Payer: BLUE CROSS/BLUE SHIELD | Source: Ambulatory Visit

## 2016-02-02 ENCOUNTER — Telehealth: Payer: Self-pay | Admitting: *Deleted

## 2016-02-02 NOTE — Telephone Encounter (Signed)
Voicemail left to follow up on cancelled lung screening follow up scan.

## 2016-02-03 ENCOUNTER — Telehealth: Payer: Self-pay | Admitting: *Deleted

## 2016-02-03 NOTE — Telephone Encounter (Signed)
Copied from prior note: Notified patient that annual lung cancer screening low dose CT scan is due. Confirmed that patient is within the age range of 55-77, and asymptomatic, (no signs or symptoms of lung cancer). Patient denies illness that would prevent curative treatment for lung cancer if found. The patient is a current smoker, with a 61 pack year history. The shared decision making visit was done 11/13/14. Patient is agreeable for CT scan being scheduled pending insurance approval.

## 2016-02-03 NOTE — Telephone Encounter (Signed)
Patient reports that past cancellation of lung screening scan was due to an insurance issue and she is ready to have it rescheduled.

## 2016-02-15 ENCOUNTER — Ambulatory Visit: Payer: BLUE CROSS/BLUE SHIELD

## 2016-02-22 ENCOUNTER — Ambulatory Visit
Admission: RE | Admit: 2016-02-22 | Discharge: 2016-02-22 | Disposition: A | Discharge status: Home / Self Care | Payer: BLUE CROSS/BLUE SHIELD | Source: Ambulatory Visit | Attending: Oncology | Admitting: Oncology

## 2016-02-22 DIAGNOSIS — I700 Atherosclerosis of aorta: Secondary | ICD-10-CM | POA: Insufficient documentation

## 2016-02-22 DIAGNOSIS — J849 Interstitial pulmonary disease, unspecified: Secondary | ICD-10-CM | POA: Insufficient documentation

## 2016-02-22 DIAGNOSIS — J439 Emphysema, unspecified: Secondary | ICD-10-CM | POA: Diagnosis not present

## 2016-02-22 DIAGNOSIS — K7460 Unspecified cirrhosis of liver: Secondary | ICD-10-CM | POA: Insufficient documentation

## 2016-02-22 DIAGNOSIS — Z87891 Personal history of nicotine dependence: Secondary | ICD-10-CM | POA: Diagnosis present

## 2016-02-22 DIAGNOSIS — I2510 Atherosclerotic heart disease of native coronary artery without angina pectoris: Secondary | ICD-10-CM | POA: Insufficient documentation

## 2016-03-03 ENCOUNTER — Encounter: Payer: Self-pay | Admitting: *Deleted

## 2016-07-22 ENCOUNTER — Inpatient Hospital Stay
Admission: EM | Admit: 2016-07-22 | Discharge: 2016-07-25 | DRG: 432 | Disposition: A | Discharge status: Home / Self Care | Payer: Medicare Other | Attending: Internal Medicine | Admitting: Internal Medicine

## 2016-07-22 ENCOUNTER — Encounter: Payer: Self-pay | Admitting: Medical Oncology

## 2016-07-22 DIAGNOSIS — I8511 Secondary esophageal varices with bleeding: Secondary | ICD-10-CM | POA: Diagnosis present

## 2016-07-22 DIAGNOSIS — F17210 Nicotine dependence, cigarettes, uncomplicated: Secondary | ICD-10-CM | POA: Diagnosis present

## 2016-07-22 DIAGNOSIS — Z9049 Acquired absence of other specified parts of digestive tract: Secondary | ICD-10-CM | POA: Diagnosis not present

## 2016-07-22 DIAGNOSIS — F329 Major depressive disorder, single episode, unspecified: Secondary | ICD-10-CM | POA: Diagnosis present

## 2016-07-22 DIAGNOSIS — K7460 Unspecified cirrhosis of liver: Secondary | ICD-10-CM

## 2016-07-22 DIAGNOSIS — Z801 Family history of malignant neoplasm of trachea, bronchus and lung: Secondary | ICD-10-CM | POA: Diagnosis not present

## 2016-07-22 DIAGNOSIS — Z886 Allergy status to analgesic agent status: Secondary | ICD-10-CM

## 2016-07-22 DIAGNOSIS — I10 Essential (primary) hypertension: Secondary | ICD-10-CM | POA: Diagnosis present

## 2016-07-22 DIAGNOSIS — K922 Gastrointestinal hemorrhage, unspecified: Secondary | ICD-10-CM | POA: Diagnosis present

## 2016-07-22 DIAGNOSIS — G629 Polyneuropathy, unspecified: Secondary | ICD-10-CM | POA: Diagnosis present

## 2016-07-22 DIAGNOSIS — D62 Acute posthemorrhagic anemia: Secondary | ICD-10-CM | POA: Diagnosis present

## 2016-07-22 DIAGNOSIS — D696 Thrombocytopenia, unspecified: Secondary | ICD-10-CM | POA: Diagnosis present

## 2016-07-22 DIAGNOSIS — G2581 Restless legs syndrome: Secondary | ICD-10-CM | POA: Diagnosis present

## 2016-07-22 DIAGNOSIS — D731 Hypersplenism: Secondary | ICD-10-CM | POA: Diagnosis present

## 2016-07-22 DIAGNOSIS — E119 Type 2 diabetes mellitus without complications: Secondary | ICD-10-CM | POA: Diagnosis present

## 2016-07-22 DIAGNOSIS — K921 Melena: Secondary | ICD-10-CM | POA: Diagnosis present

## 2016-07-22 DIAGNOSIS — K760 Fatty (change of) liver, not elsewhere classified: Secondary | ICD-10-CM | POA: Diagnosis present

## 2016-07-22 DIAGNOSIS — K7469 Other cirrhosis of liver: Secondary | ICD-10-CM | POA: Diagnosis present

## 2016-07-22 DIAGNOSIS — I248 Other forms of acute ischemic heart disease: Secondary | ICD-10-CM | POA: Diagnosis present

## 2016-07-22 DIAGNOSIS — Z794 Long term (current) use of insulin: Secondary | ICD-10-CM

## 2016-07-22 DIAGNOSIS — I8501 Esophageal varices with bleeding: Secondary | ICD-10-CM | POA: Diagnosis not present

## 2016-07-22 DIAGNOSIS — K920 Hematemesis: Secondary | ICD-10-CM | POA: Diagnosis not present

## 2016-07-22 DIAGNOSIS — Z86010 Personal history of colonic polyps: Secondary | ICD-10-CM

## 2016-07-22 DIAGNOSIS — Z90710 Acquired absence of both cervix and uterus: Secondary | ICD-10-CM | POA: Diagnosis not present

## 2016-07-22 DIAGNOSIS — Z7982 Long term (current) use of aspirin: Secondary | ICD-10-CM | POA: Diagnosis not present

## 2016-07-22 DIAGNOSIS — Z881 Allergy status to other antibiotic agents status: Secondary | ICD-10-CM | POA: Diagnosis not present

## 2016-07-22 DIAGNOSIS — J449 Chronic obstructive pulmonary disease, unspecified: Secondary | ICD-10-CM | POA: Diagnosis present

## 2016-07-22 LAB — TROPONIN I
TROPONIN I: 0.06 ng/mL — AB (ref <0.03)
TROPONIN I: 0.06 ng/mL — AB (ref <0.03)
Troponin I: 0.06 ng/mL (ref <0.03)
Troponin I: 0.08 ng/mL (ref <0.03)

## 2016-07-22 LAB — PROTIME-INR
INR: 1.19
Prothrombin Time: 15.2 seconds (ref 11.4–15.2)

## 2016-07-22 LAB — CBC
HEMATOCRIT: 31.2 % — AB (ref 35.0–47.0)
Hemoglobin: 10.4 g/dL — ABNORMAL LOW (ref 12.0–16.0)
MCH: 28.9 pg (ref 26.0–34.0)
MCHC: 33.4 g/dL (ref 32.0–36.0)
MCV: 86.5 fL (ref 80.0–100.0)
Platelets: 122 10*3/uL — ABNORMAL LOW (ref 150–440)
RBC: 3.61 MIL/uL — ABNORMAL LOW (ref 3.80–5.20)
RDW: 16.8 % — AB (ref 11.5–14.5)
WBC: 8.6 10*3/uL (ref 3.6–11.0)

## 2016-07-22 LAB — APTT: aPTT: 30 seconds (ref 24–36)

## 2016-07-22 LAB — HEMOGLOBIN AND HEMATOCRIT, BLOOD
HEMATOCRIT: 27.9 % — AB (ref 35.0–47.0)
HEMATOCRIT: 25.4 % — AB (ref 35.0–47.0)
HEMOGLOBIN: 9.4 g/dL — AB (ref 12.0–16.0)
HEMOGLOBIN: 8.5 g/dL — AB (ref 12.0–16.0)

## 2016-07-22 LAB — GLUCOSE, CAPILLARY
GLUCOSE-CAPILLARY: 304 mg/dL — AB (ref 65–99)
Glucose-Capillary: 523 mg/dL (ref 65–99)
Glucose-Capillary: 514 mg/dL (ref 65–99)
Glucose-Capillary: 323 mg/dL — ABNORMAL HIGH (ref 65–99)

## 2016-07-22 LAB — COMPREHENSIVE METABOLIC PANEL
ALT: 27 U/L (ref 14–54)
AST: 33 U/L (ref 15–41)
Albumin: 3.5 g/dL (ref 3.5–5.0)
Alkaline Phosphatase: 89 U/L (ref 38–126)
Anion gap: 10 (ref 5–15)
BILIRUBIN TOTAL: 1.3 mg/dL — AB (ref 0.3–1.2)
BUN: 29 mg/dL — AB (ref 6–20)
CO2: 22 mmol/L (ref 22–32)
Calcium: 9.1 mg/dL (ref 8.9–10.3)
Chloride: 101 mmol/L (ref 101–111)
Creatinine, Ser: 0.95 mg/dL (ref 0.44–1.00)
Glucose, Bld: 478 mg/dL — ABNORMAL HIGH (ref 65–99)
POTASSIUM: 4.7 mmol/L (ref 3.5–5.1)
Sodium: 133 mmol/L — ABNORMAL LOW (ref 135–145)
Total Protein: 6.8 g/dL (ref 6.5–8.1)

## 2016-07-22 LAB — LIPASE, BLOOD: LIPASE: 25 U/L (ref 11–51)

## 2016-07-22 MED ORDER — PANTOPRAZOLE SODIUM 40 MG IV SOLR
40.0000 mg | Freq: Once | INTRAVENOUS | Status: DC
  Filled 2016-07-22: qty 40

## 2016-07-22 MED ORDER — TRAZODONE HCL 50 MG PO TABS: 50.0000 mg | ORAL_TABLET | Freq: Every evening | ORAL | Status: DC | PRN

## 2016-07-22 MED ORDER — DEXTROSE 5 % IV SOLN
1.0000 g | Freq: Every day | INTRAVENOUS | Status: DC
  Administered 2016-07-22 – 2016-07-25 (×3): 1 g via INTRAVENOUS
  Filled 2016-07-22 (×5): qty 10

## 2016-07-22 MED ORDER — INSULIN REGULAR HUMAN 100 UNIT/ML IJ SOLN
20.0000 [IU] | Freq: Two times a day (BID) | INTRAMUSCULAR | Status: DC
  Filled 2016-07-22 (×2): qty 0.2

## 2016-07-22 MED ORDER — INSULIN ASPART 100 UNIT/ML ~~LOC~~ SOLN
20.0000 [IU] | Freq: Once | SUBCUTANEOUS | Status: AC
  Administered 2016-07-22: 20 [IU] via SUBCUTANEOUS

## 2016-07-22 MED ORDER — HYDROCODONE-ACETAMINOPHEN 5-325 MG PO TABS
1.0000 | ORAL_TABLET | Freq: Four times a day (QID) | ORAL | Status: DC | PRN
Start: 2016-07-22 — End: 2016-07-25
  Administered 2016-07-23 – 2016-07-25 (×4): 1 via ORAL
  Filled 2016-07-22 (×4): qty 1

## 2016-07-22 MED ORDER — ONDANSETRON HCL 4 MG/2ML IJ SOLN
4.0000 mg | Freq: Once | INTRAMUSCULAR | Status: AC
  Administered 2016-07-22: 4 mg via INTRAVENOUS
  Filled 2016-07-22: qty 2

## 2016-07-22 MED ORDER — ONDANSETRON HCL 4 MG/2ML IJ SOLN
4.0000 mg | Freq: Four times a day (QID) | INTRAMUSCULAR | Status: DC | PRN
  Administered 2016-07-22 – 2016-07-24 (×6): 4 mg via INTRAVENOUS
  Filled 2016-07-22 (×7): qty 2

## 2016-07-22 MED ORDER — GABAPENTIN 100 MG PO CAPS
100.0000 mg | ORAL_CAPSULE | Freq: Two times a day (BID) | ORAL | Status: DC
  Administered 2016-07-22 – 2016-07-25 (×5): 100 mg via ORAL
  Filled 2016-07-22 (×5): qty 1

## 2016-07-22 MED ORDER — NADOLOL 40 MG PO TABS
80.0000 mg | ORAL_TABLET | Freq: Every day | ORAL | Status: DC
  Administered 2016-07-23 – 2016-07-25 (×2): 80 mg via ORAL
  Filled 2016-07-22 (×2): qty 2
  Filled 2016-07-22: qty 1
  Filled 2016-07-22: qty 2

## 2016-07-22 MED ORDER — ROPINIROLE HCL 1 MG PO TABS
1.0000 mg | ORAL_TABLET | Freq: Every day | ORAL | Status: DC
  Administered 2016-07-22 – 2016-07-24 (×3): 1 mg via ORAL
  Filled 2016-07-22 (×3): qty 1

## 2016-07-22 MED ORDER — SODIUM CHLORIDE 0.9 % IV SOLN
8.0000 mg/h | INTRAVENOUS | Status: DC
  Administered 2016-07-22 – 2016-07-24 (×5): 8 mg/h via INTRAVENOUS
  Filled 2016-07-22 (×5): qty 80

## 2016-07-22 MED ORDER — SODIUM CHLORIDE 0.9 % IV SOLN
80.0000 mg | Freq: Once | INTRAVENOUS | Status: AC
  Administered 2016-07-22: 80 mg via INTRAVENOUS
  Filled 2016-07-22: qty 80

## 2016-07-22 MED ORDER — SUMATRIPTAN SUCCINATE 50 MG PO TABS
50.0000 mg | ORAL_TABLET | ORAL | Status: DC | PRN
  Filled 2016-07-22: qty 1

## 2016-07-22 MED ORDER — INSULIN ASPART 100 UNIT/ML ~~LOC~~ SOLN: 0.0000 [IU] | Freq: Three times a day (TID) | SUBCUTANEOUS | Status: DC

## 2016-07-22 MED ORDER — ONDANSETRON HCL 4 MG/2ML IJ SOLN
INTRAMUSCULAR | Status: AC
  Administered 2016-07-22: 4 mg
  Filled 2016-07-22: qty 2

## 2016-07-22 MED ORDER — INSULIN ASPART 100 UNIT/ML ~~LOC~~ SOLN
20.0000 [IU] | Freq: Two times a day (BID) | SUBCUTANEOUS | Status: DC
  Administered 2016-07-22: 20 [IU] via SUBCUTANEOUS
  Filled 2016-07-22: qty 1

## 2016-07-22 MED ORDER — OCTREOTIDE LOAD VIA INFUSION
50.0000 ug | Freq: Once | INTRAVENOUS | Status: DC
  Filled 2016-07-22: qty 25

## 2016-07-22 MED ORDER — BUPROPION HCL ER (XL) 150 MG PO TB24
150.0000 mg | ORAL_TABLET | Freq: Two times a day (BID) | ORAL | Status: DC
  Administered 2016-07-22 – 2016-07-25 (×5): 150 mg via ORAL
  Filled 2016-07-22 (×5): qty 1

## 2016-07-22 MED ORDER — ALPRAZOLAM 0.25 MG PO TABS
0.2500 mg | ORAL_TABLET | Freq: Every evening | ORAL | Status: DC | PRN
  Administered 2016-07-23 – 2016-07-24 (×2): 0.25 mg via ORAL
  Filled 2016-07-22 (×2): qty 1

## 2016-07-22 MED ORDER — INSULIN ASPART 100 UNIT/ML ~~LOC~~ SOLN
SUBCUTANEOUS | Status: AC
  Filled 2016-07-22: qty 1

## 2016-07-22 MED ORDER — INSULIN ASPART 100 UNIT/ML ~~LOC~~ SOLN
20.0000 [IU] | Freq: Once | SUBCUTANEOUS | Status: DC
  Administered 2016-07-22: 20 [IU] via SUBCUTANEOUS

## 2016-07-22 MED ORDER — INSULIN NPH (HUMAN) (ISOPHANE) 100 UNIT/ML ~~LOC~~ SUSP: 10.0000 [IU] | Freq: Two times a day (BID) | SUBCUTANEOUS | Status: DC

## 2016-07-22 MED ORDER — INSULIN ASPART 100 UNIT/ML ~~LOC~~ SOLN
0.0000 [IU] | Freq: Three times a day (TID) | SUBCUTANEOUS | Status: DC
  Administered 2016-07-22: 11 [IU] via SUBCUTANEOUS
  Administered 2016-07-23: 8 [IU] via SUBCUTANEOUS
  Administered 2016-07-23: 11 [IU] via SUBCUTANEOUS
  Administered 2016-07-24 (×2): 2 [IU] via SUBCUTANEOUS
  Administered 2016-07-25: 8 [IU] via SUBCUTANEOUS
  Administered 2016-07-25: 3 [IU] via SUBCUTANEOUS
  Filled 2016-07-22 (×8): qty 1

## 2016-07-22 MED ORDER — ESCITALOPRAM OXALATE 10 MG PO TABS
10.0000 mg | ORAL_TABLET | Freq: Every day | ORAL | Status: DC
  Administered 2016-07-22 – 2016-07-25 (×3): 10 mg via ORAL
  Filled 2016-07-22 (×4): qty 1

## 2016-07-22 MED ORDER — SENNOSIDES-DOCUSATE SODIUM 8.6-50 MG PO TABS: 1.0000 | ORAL_TABLET | Freq: Every evening | ORAL | Status: DC | PRN

## 2016-07-22 MED ORDER — CYCLOBENZAPRINE HCL 10 MG PO TABS: 10.0000 mg | ORAL_TABLET | Freq: Three times a day (TID) | ORAL | Status: DC | PRN

## 2016-07-22 MED ORDER — SODIUM CHLORIDE 0.9 % IV SOLN
25.0000 ug/h | INTRAVENOUS | Status: DC
  Administered 2016-07-22 – 2016-07-23 (×2): 25 ug/h via INTRAVENOUS
  Administered 2016-07-24 – 2016-07-25 (×3): 50 ug/h via INTRAVENOUS
  Filled 2016-07-22 (×15): qty 1

## 2016-07-22 MED ORDER — HYDROCHLOROTHIAZIDE 25 MG PO TABS
25.0000 mg | ORAL_TABLET | Freq: Every day | ORAL | Status: DC
  Administered 2016-07-23: 25 mg via ORAL
  Filled 2016-07-22 (×2): qty 1

## 2016-07-22 MED ORDER — ONDANSETRON HCL 4 MG PO TABS: 4.0000 mg | ORAL_TABLET | Freq: Four times a day (QID) | ORAL | Status: DC | PRN

## 2016-07-22 MED ORDER — SODIUM CHLORIDE 0.9 % IV SOLN
INTRAVENOUS | Status: DC
  Administered 2016-07-22: 1000 mL via INTRAVENOUS
  Administered 2016-07-23 – 2016-07-25 (×5): via INTRAVENOUS

## 2016-07-22 MED ORDER — INSULIN DETEMIR 100 UNIT/ML ~~LOC~~ SOLN
10.0000 [IU] | Freq: Two times a day (BID) | SUBCUTANEOUS | Status: DC
  Administered 2016-07-22 – 2016-07-25 (×9): 10 [IU] via SUBCUTANEOUS
  Filled 2016-07-22 (×14): qty 0.1

## 2016-07-22 NOTE — ED Provider Notes (Signed)
[facility] Emergency Department Provider Note   ____________________________________________    I have reviewed the triage vital signs and the nursing notes.   HISTORY  Chief Complaint Tachycardia and Emesis     HPI Vanessa Oneill is a 65 y.o. female with a history of nonalcoholic cirrhosis, COPD, diabetes, hypertension, with history of varices and variceal bleeding who presents today with nausea and vomiting and black stools. Patient reports she has felt like she has had a racing heart over the last 3 days. She denies chest pain, no shortness of breath, no pleurisy, no recent leg swelling or pain. Overnight at approximately 1:30 AM she became nauseated and vomited several times, she reports it was a brown liquid. She also reports she had loose stool and it was black. She has not taken any Imodium or Pepto-Bismol   Past Medical History:  Diagnosis Date  . COPD (chronic obstructive pulmonary disease) ([place])   . Diabetes mellitus without complication ([place])   . Hypertension   . 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[place])   . Personal history of tobacco use, presenting hazards to health 11/12/2014    Patient Active Problem List   Diagnosis Date Noted  . Personal history of tobacco use, presenting hazards to health 11/12/2014    Past Surgical History:  Procedure Laterality Date  . ABDOMINAL HYSTERECTOMY    . BREAST BIOPSY Right    neg  . CHOLECYSTECTOMY      Prior to Admission medications   Medication Sig Start Date End Date Taking? Authorizing Provider  gabapentin (NEURONTIN) 100 MG capsule Take 100 mg by mouth 2 (two) times daily.    [provider]  glimepiride (AMARYL) 4 MG tablet Take 4 mg by mouth daily with breakfast.    [provider]  HYDROcodone-acetaminophen (NORCO/VICODIN) 5-325 MG tablet Take 1 tablet by mouth every 6 (six) hours as needed for moderate pain.    [provider]  insulin NPH Human (HUMULIN  N,NOVOLIN N) 100 UNIT/ML injection Inject 26 Units into the skin at bedtime.    [provider]  insulin regular (NOVOLIN R,HUMULIN R) 100 units/mL injection Inject 22 Units into the skin 3 (three) times daily before meals.    [provider]  losartan (COZAAR) 100 MG tablet Take 100 mg by mouth daily.    [provider]  nadolol (CORGARD) 80 MG tablet Take 80 mg by mouth daily.    [provider]  pantoprazole (PROTONIX) 40 MG tablet Take 40 mg by mouth daily.    [provider]  rOPINIRole (REQUIP) 1 MG tablet Take 1 mg by mouth 3 (three) times daily.    [provider]  SUMAtriptan (IMITREX) 50 MG tablet Take 1 tablet (50 mg total) by mouth every 2 (two) hours as needed for migraine. May repeat in 2 hours if headache persists or recurs. 01/28/15   Heriberto Antigua, MD     Allergies Tylenol [acetaminophen] and Amoxicillin  Family History  Problem Relation Age of Onset  . Lung cancer Father     Social History Social History  Substance Use Topics  . Smoking status: Current Every Day Smoker    Packs/day: 1.00    Years: 48.00    Types: Cigarettes  . Smokeless tobacco: Not on file  . Alcohol use No    Review of Systems  Constitutional: No fever/chills Eyes: No visual changes.  ENT: No sore throat. Cardiovascular: Denies chest pain.Racing heart as above Respiratory: Denies shortness of breath. Gastrointestinal: No  abdominal pain. Nausea and vomiting as above Genitourinary: Negative for dysuria. Musculoskeletal: Negative for back pain. Skin: Negative for rash. Neurological: Negative for headaches    ____________________________________________   PHYSICAL EXAM:  VITAL SIGNS: ED Triage Vitals  Enc Vitals Group     BP 07/22/16 0714 (!) 142/72     Pulse Rate 07/22/16 0659 (!) 126     Resp --      Temp 07/22/16 0659 98.2 F (36.8 C)     Temp src --      SpO2 07/22/16 0714 95 %     Weight 07/22/16 0701 90.7 kg (200 lb)       Height 07/22/16 0701 1.702 m (5\' 7" )     Head Circumference --      Peak Flow --      Pain Score 07/22/16 0701 5     Pain Loc --      Pain Edu? --      Excl. in [place]? --     Constitutional: Alert and oriented. No acute distress. Pleasant and interactive, dried brown vomitus on her shirt, no coffee grounds noted Eyes: Conjunctivae are normal.  Head: Atraumatic. Nose: No congestion/rhinnorhea. Mouth/Throat: Mucous membranes are moist.    Cardiovascular: Tachycardia, regular rhythm. Grossly normal heart sounds.  Good peripheral circulation. Respiratory: Normal respiratory effort.  No retractions. Lungs CTAB. Gastrointestinal: Soft and nontender. No distention.  No CVA tenderness. Black stool, guaiac positive Genitourinary: deferred Musculoskeletal: No lower extremity tenderness nor edema.  Warm and well perfused Neurologic:  Normal speech and language. No gross focal neurologic deficits are appreciated.  Skin:  Skin is warm, dry and intact. No rash noted. Psychiatric: Mood and affect are normal. Speech and behavior are normal.  ____________________________________________   LABS (all labs ordered are listed, but only abnormal results are displayed)  Labs Reviewed  CBC - Abnormal; Notable for the following:       Result Value   RBC 3.61 (*)    Hemoglobin 10.4 (*)    HCT 31.2 (*)    RDW 16.8 (*)    Platelets 122 (*)    All other components within normal limits  COMPREHENSIVE METABOLIC PANEL - Abnormal; Notable for the following:    Sodium 133 (*)    Glucose, Bld 478 (*)    BUN 29 (*)    Total Bilirubin 1.3 (*)    All other components within normal limits  TROPONIN I - Abnormal; Notable for the following:    Troponin I 0.06 (*)    All other components within normal limits  LIPASE, BLOOD  PROTIME-INR  APTT  TYPE AND SCREEN   ____________________________________________  EKG  ED ECG REPORT I, Lavonia Drafts, the attending physician, personally viewed and  interpreted this ECG.  Date: 07/22/2016 EKG Time: 7:19 AM Rate: 115 Rhythm: Sinus tachycardia QRS Axis: normal Intervals: normal ST/T Wave abnormalities: normal   ____________________________________________  RADIOLOGY  None ____________________________________________   PROCEDURES  Procedure(s) performed: No    Critical Care performed: yes  CRITICAL CARE Performed by: Lavonia Drafts   Total critical care time: 30 minutes  Critical care time was exclusive of separately billable procedures and treating other patients.  Critical care was necessary to treat or prevent imminent or life-threatening deterioration.  Critical care was time spent personally by me on the following activities: development of treatment plan with patient and/or surrogate as well as nursing, discussions with consultants, evaluation of patient's response to treatment, examination of patient, obtaining history from patient or surrogate,  ordering and performing treatments and interventions, ordering and review of laboratory studies, ordering and review of radiographic studies, pulse oximetry and re-evaluation of patient's condition.  ____________________________________________   INITIAL IMPRESSION / ASSESSMENT AND PLAN / ED COURSE  Pertinent labs & imaging results that were available during my care of the patient were reviewed by me and considered in my medical decision making (see chart for details).  Patient presents with nausea vomiting and loose stools without abdominal pain. She is tachycardic but states this has preceded today's vomiting. Denies fevers or chills. We will give IV fluids, IV Zofran, IV Protonix check stool for occult blood, monitor carefully while we await labs  Patient's hemoglobin is 10.4, down from most recent 12.6, 2 months ago. She has guaiac positive black stool consistent with GI bleed.  Troponin is mildly elevated, this is likely demand related however she does describe  now that she has had intermittent chest discomfort over the last several days.  Given her GI bleed unable to give aspirin, did not feel heparin is indicated as not consistent with ACS, likely demand related. We'll continue IV fluids add type and screen and admit to the hospitalist service    ____________________________________________   FINAL CLINICAL IMPRESSION(S) / ED DIAGNOSES  Final diagnoses:  Gastrointestinal hemorrhage, unspecified gastrointestinal hemorrhage type  Demand ischemia ([place])      NEW MEDICATIONS STARTED DURING THIS VISIT:  New Prescriptions   No medications on file     Note:  This document was prepared using Dragon voice recognition software and may include unintentional dictation errors.    Lavonia Drafts, MD 07/22/16 [phone]

## 2016-07-22 NOTE — Progress Notes (Signed)
Pharmacy Antibiotic Note  Vanessa Oneill is a 66 y.o. female with a h/o nonalcoholic cirrhosis  admitted on 07/22/2016 with GIB.  Pharmacy has been consulted for ceftriaxone dosing for SBP prophylaxis.  Plan: Ceftriaxone 1 g iv daily.   Height: 5\' 7"  (170.2 cm) Weight: 200 lb (90.7 kg) IBW/kg (Calculated) : 61.6  Temp (24hrs), Avg:98.3 F (36.8 C), Min:98.2 F (36.8 C), Max:98.3 F (36.8 C)   Recent Labs Lab 07/22/16 0727  WBC 8.6  CREATININE 0.95    Estimated Creatinine Clearance: 68.2 mL/min (by C-G formula based on SCr of 0.95 mg/dL).    Allergies  Allergen Reactions  . Tylenol [Acetaminophen] Other (See Comments)    "tylenol 3" chest pains  . Amoxicillin Itching and Rash    Antimicrobials this admission: ceftriaxone 7/21 >>   Dose adjustments this admission:  Microbiology results:   Thank you for allowing pharmacy to be a part of this patient's care.  Ulice Dash D 07/22/2016 1:54 PM

## 2016-07-22 NOTE — H&P (Addendum)
[facility] [place] [facility] NAME: Vanessa Oneill    MR#:  [id number]  DATE OF BIRTH:  09/24/1950  DATE OF ADMISSION:  07/22/2016  PRIMARY CARE PHYSICIAN: Adin Hector, MD   REQUESTING/REFERRING PHYSICIAN: dr Corky Downs  CHIEF COMPLAINT:   Dark colored stools HISTORY OF PRESENT ILLNESS:  Vanessa Oneill  is a 66 y.o. female with a known history of Nonalcoholic cirrhosis with esophageal varices and banding, diabetes and COPD who presents with above complaint. Patient reports that since yesterday she has noticed dark-colored stools and coffee-ground emesis. She has chronic abdominal pain but no new abdominal pain. She is taking baby aspirin but no other NSAIDs. She denies feelings of lightheadedness or chest pain. She does report dyspnea on exertion and some mild lower extremity edema over the past several weeks.  PAST MEDICAL HISTORY:   Past Medical History:  Diagnosis Date  . COPD (chronic obstructive pulmonary disease) ([place])   . Diabetes mellitus without complication ([place])   . Hypertension   . 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[place])   . Personal history of tobacco use, presenting hazards to health 11/12/2014    PAST SURGICAL HISTORY:   Past Surgical History:  Procedure Laterality Date  . ABDOMINAL HYSTERECTOMY    . BREAST BIOPSY Right    neg  . CHOLECYSTECTOMY      SOCIAL HISTORY:   Social History  Substance Use Topics  . Smoking status: Current Every Day Smoker    Packs/day: 1.00    Years: 48.00    Types: Cigarettes  . Smokeless tobacco: Not on file  . Alcohol use No    FAMILY HISTORY:   Family History  Problem Relation Age of Onset  . Lung cancer Father     DRUG ALLERGIES:   Allergies  Allergen Reactions  . Tylenol [Acetaminophen] Other (See Comments)    "tylenol 3" chest pains  . Amoxicillin Itching and Rash    REVIEW OF SYSTEMS:   Review of Systems  Constitutional: Negative.  Negative for chills, fever and  malaise/fatigue.  HENT: Negative.  Negative for ear discharge, ear pain, hearing loss, nosebleeds and sore throat.   Eyes: Negative.  Negative for blurred vision and pain.  Respiratory: Positive for shortness of breath. Negative for cough, hemoptysis and wheezing.   Cardiovascular: Positive for leg swelling. Negative for chest pain and palpitations.  Gastrointestinal: Positive for abdominal pain, melena and nausea. Negative for blood in stool, diarrhea and vomiting.  Genitourinary: Negative.  Negative for dysuria.  Musculoskeletal: Negative.  Negative for back pain.  Skin: Negative.   Neurological: Negative for dizziness, tremors, speech change, focal weakness, seizures and headaches.  Endo/Heme/Allergies: Negative.  Does not bruise/bleed easily.  Psychiatric/Behavioral: Negative.  Negative for depression, hallucinations and suicidal ideas.    MEDICATIONS AT HOME:   Prior to Admission medications   Medication Sig Start Date End Date Taking? Authorizing Provider  ALPRAZolam Duanne Moron) 0.25 MG tablet Take 0.25 mg by mouth at bedtime as needed for sleep. 07/06/16  Yes [provider]  aspirin EC 81 MG tablet Take 81 mg by mouth daily.   Yes [provider]  buPROPion (WELLBUTRIN XL) 150 MG 24 hr tablet Take 150 mg by mouth 2 (two) times daily. 06/17/16  Yes [provider]  cyclobenzaprine (FLEXERIL) 10 MG tablet Take 10 mg by mouth 3 (three) times daily as needed for muscle spasms. 09/20/15  Yes [provider]  escitalopram (LEXAPRO) 10 MG tablet Take 10 mg by mouth daily.  07/08/16  Yes [provider]  gabapentin (NEURONTIN) 100 MG capsule Take 100 mg by mouth 2 (two) times daily.   Yes [provider]  glimepiride (AMARYL) 4 MG tablet Take 4 mg by mouth daily with breakfast.   Yes [provider]  hydrochlorothiazide (HYDRODIURIL) 25 MG tablet Take 25 mg by mouth daily. 05/17/16  Yes [provider]  HYDROcodone-acetaminophen  (NORCO/VICODIN) 5-325 MG tablet Take 1 tablet by mouth every 6 (six) hours as needed for moderate pain.   Yes [provider]  insulin NPH Human (HUMULIN N,NOVOLIN N) 100 UNIT/ML injection Inject 20-30 Units into the skin 3 (three) times daily - between meals and at bedtime. Use 30 units in the morning, 20 units during the day and 30 units at night.   Yes [provider]  insulin regular (NOVOLIN R,HUMULIN R) 100 units/mL injection Inject 20 Units into the skin 2 (two) times daily before lunch and supper.    Yes [provider]  ketorolac (TORADOL) 10 MG tablet Take 10 mg by mouth daily as needed for headache. 07/07/16  Yes [provider]  losartan (COZAAR) 100 MG tablet Take 100 mg by mouth daily.   Yes [provider]  metFORMIN (GLUCOPHAGE-XR) 500 MG 24 hr tablet Take 1,000 mg by mouth daily. 03/07/16  Yes [provider]  nadolol (CORGARD) 80 MG tablet Take 80 mg by mouth daily.   Yes [provider]  pantoprazole (PROTONIX) 40 MG tablet Take 40 mg by mouth 2 (two) times daily.    Yes [provider]  rOPINIRole (REQUIP) 1 MG tablet Take 1 mg by mouth at bedtime.    Yes [provider]  traZODone (DESYREL) 50 MG tablet Take 50 mg by mouth at bedtime as needed for sleep. 06/19/16  Yes [provider]  SUMAtriptan (IMITREX) 50 MG tablet Take 1 tablet (50 mg total) by mouth every 2 (two) hours as needed for migraine. May repeat in 2 hours if headache persists or recurs. Patient not taking: Reported on 07/22/2016 01/28/15   Heriberto Antigua, MD      VITAL SIGNS:  Blood pressure (!) 142/72, pulse (!) 116, temperature 98.2 F (36.8 C), height 5\' 7"  (1.702 m), weight 90.7 kg (200 lb), SpO2 95 %.  PHYSICAL EXAMINATION:   Physical Exam  Constitutional: She is oriented to person, place, and time and well-developed, well-nourished, and in no distress. No distress.  HENT:  Head: Normocephalic.  Eyes: No scleral  icterus.  Neck: Normal range of motion. Neck supple. No JVD present. No tracheal deviation present.  Cardiovascular: Normal rate, regular rhythm and normal heart sounds.  Exam reveals no gallop and no friction rub.   No murmur heard. Pulmonary/Chest: Effort normal and breath sounds normal. No respiratory distress. She has no wheezes. She has no rales. She exhibits no tenderness.  Abdominal: Soft. Bowel sounds are normal. She exhibits no distension and no mass. There is no tenderness. There is no rebound and no guarding.  Musculoskeletal: Normal range of motion. She exhibits no edema.  Neurological: She is alert and oriented to person, place, and time.  Skin: Skin is warm. No rash noted. No erythema.  Psychiatric: Affect and judgment normal.      LABORATORY PANEL:   CBC  Recent Labs Lab 07/22/16 0727  WBC 8.6  HGB 10.4*  HCT 31.2*  PLT 122*   ------------------------------------------------------------------------------------------------------------------  Chemistries   Recent Labs Lab 07/22/16 0727  NA 133*  K 4.7  CL 101  CO2 22  GLUCOSE 478*  BUN 29*  CREATININE 0.95  CALCIUM 9.1  AST 33  ALT 27  ALKPHOS 89  BILITOT 1.3*   ------------------------------------------------------------------------------------------------------------------  Cardiac Enzymes  Recent Labs Lab 07/22/16 0727  TROPONINI 0.06*   ------------------------------------------------------------------------------------------------------------------  RADIOLOGY:  No results found.  EKG:  Sinus tachycardia heart rate 1:15 no ST elevation  Minimal ST depression in lateral leads,  IMPRESSION AND PLAN:   66 year old female with a history of nonalcoholic cirrhosis and esophageal varices status post banding, diabetes and COPD who presents with melena and coffee-ground emesis.  1. Upper GI bleed: Continue Protonix and add Octreotide gtt. Start empiric Rocephin given history of GI bleed and  varices Hemoglobin every 6 hours Transfuse if hemoglobin less than 7 or vitals become hemodynamically unstable GI consult Continue nadolol 2. Dyspnea exertion with minimal elevation troponin and EKG changes due to demand ischemia Order echocardiogram.. KC to read Follow troponins Continue telemetry Cardiology consultation due to EKG changes  3. Diabetes: Hold oral medications Decreased dose of insulin Start sliding scale  4. COPD without exacerbation  5. Depression: Continue Lexapro and Wellbutrin 6. Essential hypertension: Continue HCTZ Hold losartan for now due to GI bleed and concern for possible low blood pressure  7. Tobacco dependence: Patient is encouraged to quit smoking. Counseling was provided for 4 minutes.     All the records are reviewed and case discussed with ED provider. Management plans discussed with the patient and she is in agreement  CODE STATUS: full  TOTAL TIME TAKING CARE OF THIS PATIENT: 50 minutes.    Massimiliano Rohleder M.D on 07/22/2016 at 10:02 AM  Between 7am to 6pm - Pager - [phone]  After 6pm go to [email or web address] - password [email or web address]  Office  [phone]  CC: Primary care physician; Adin Hector, MD

## 2016-07-22 NOTE — Consult Note (Signed)
[facility] Card[facility]  CARDIOLOGY CONSULT NOTE  Patient ID: Vanessa Oneill MRN: [id number] DOB/AGE: 1950-08-17 66 y.o.  Admit date: 07/22/2016 Referring Physician Mody Primary Physician [facility] Cardiologist  Reason for Consultation Borderline elevated troponin  HPI: 66 year old female referred for evaluation of borderline elevated troponin and chest discomfort. Patient has a history of nonalcoholic cirrhosis and varus oh bleeding. She presents with several day history of nausea and vomiting, with black stools. Admission labs were notable for hemoglobin and hematocrit of 10.4 and 31.2, respectively. Admission troponin was 0.06. The patient does describe some day history of exertional chest discomfort described as a pounding sensation in her chest.  Review of systems complete and found to be negative unless listed above     Past Medical History:  Diagnosis Date  . COPD (chronic obstructive pulmonary disease) ([place])   . Diabetes mellitus without complication ([place])   . Hypertension   . 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[place])   . Personal history of tobacco use, presenting hazards to health 11/12/2014    Past Surgical History:  Procedure Laterality Date  . ABDOMINAL HYSTERECTOMY    . BREAST BIOPSY Right    neg  . CHOLECYSTECTOMY      Prescriptions Prior to Admission  Medication Sig Dispense Refill Last Dose  . ALPRAZolam (XANAX) 0.25 MG tablet Take 0.25 mg by mouth at bedtime as needed for sleep.  5 PRN at PRN  . aspirin EC 81 MG tablet Take 81 mg by mouth daily.   07/21/2016 at 2000  . buPROPion (WELLBUTRIN XL) 150 MG 24 hr tablet Take 150 mg by mouth 2 (two) times daily.  10 07/21/2016 at 2000  . cyclobenzaprine (FLEXERIL) 10 MG tablet Take 10 mg by mouth 3 (three) times daily as needed for muscle spasms.   PRN at PRN  . escitalopram (LEXAPRO) 10 MG tablet Take 10 mg by mouth daily.  11 07/21/2016 at 0800  . gabapentin (NEURONTIN) 100 MG capsule Take 100 mg by mouth 2 (two) times daily.    07/21/2016 at 2000  . glimepiride (AMARYL) 4 MG tablet Take 4 mg by mouth daily with breakfast.   07/21/2016 at 0800  . hydrochlorothiazide (HYDRODIURIL) 25 MG tablet Take 25 mg by mouth daily.  4 07/21/2016 at 0800  . HYDROcodone-acetaminophen (NORCO/VICODIN) 5-325 MG tablet Take 1 tablet by mouth every 6 (six) hours as needed for moderate pain.   PRN at PRN  . insulin NPH Human (HUMULIN N,NOVOLIN N) 100 UNIT/ML injection Inject 20-30 Units into the skin 3 (three) times daily - between meals and at bedtime. Use 30 units in the morning, 20 units during the day and 30 units at night.   07/21/2016 at 2000  . insulin regular (NOVOLIN R,HUMULIN R) 100 units/mL injection Inject 20 Units into the skin 2 (two) times daily before lunch and supper.    07/21/2016 at 1600  . ketorolac (TORADOL) 10 MG tablet Take 10 mg by mouth daily as needed for headache.   PRN at PRN  . losartan (COZAAR) 100 MG tablet Take 100 mg by mouth daily.   07/21/2016 at 0800  . metFORMIN (GLUCOPHAGE-XR) 500 MG 24 hr tablet Take 1,000 mg by mouth daily.   07/21/2016 at 2000  . nadolol (CORGARD) 80 MG tablet Take 80 mg by mouth daily.   07/21/2016 at 2000  . pantoprazole (PROTONIX) 40 MG tablet Take 40 mg by mouth 2 (two) times daily.    07/21/2016 at 2000  . rOPINIRole (REQUIP) 1 MG tablet Take 1 mg  by mouth at bedtime.    07/21/2016 at 2000  . traZODone (DESYREL) 50 MG tablet Take 50 mg by mouth at bedtime as needed for sleep.  0 PRN at PRN  . SUMAtriptan (IMITREX) 50 MG tablet Take 1 tablet (50 mg total) by mouth every 2 (two) hours as needed for migraine. May repeat in 2 hours if headache persists or recurs. (Patient not taking: Reported on 07/22/2016) 10 tablet 0 Not Taking   Social History   Social History  . Marital status: Married    Spouse name: N/A  . Number of children: N/A  . Years of education: N/A   Occupational History  . Not on file.   Social History Main Topics  . Smoking status: Current Every Day Smoker    Packs/day:  1.00    Years: 48.00    Types: Cigarettes  . Smokeless tobacco: Never Used  . Alcohol use No  . Drug use: Unknown  . Sexual activity: Not on file   Other Topics Concern  . Not on file   Social History Narrative  . No narrative on file    Family History  Problem Relation Age of Onset  . Lung cancer Father       Review of systems complete and found to be negative unless listed above      PHYSICAL EXAM  General: Well developed, well nourished, in no acute distress HEENT:  Normocephalic and atramatic Neck:  No JVD.  Lungs: Clear bilaterally to auscultation and percussion. Heart: HRRR . Normal S1 and S2 without gallops or murmurs.  Abdomen: Bowel sounds are positive, abdomen soft and non-tender  Msk:  Back normal, normal gait. Normal strength and tone for age. Extremities: No clubbing, cyanosis or edema.   Neuro: Alert and oriented X 3. Psych:  Good affect, responds appropriately  Labs:   Lab Results  Component Value Date   WBC 8.6 07/22/2016   HGB 9.4 (L) 07/22/2016   HCT 27.9 (L) 07/22/2016   MCV 86.5 07/22/2016   PLT 122 (L) 07/22/2016    Recent Labs Lab 07/22/16 0727  NA 133*  K 4.7  CL 101  CO2 22  BUN 29*  CREATININE 0.95  CALCIUM 9.1  PROT 6.8  BILITOT 1.3*  ALKPHOS 89  ALT 27  AST 33  GLUCOSE 478*   Lab Results  Component Value Date   TROPONINI 0.06 ([employer]) 07/22/2016   No results found for: CHOL No results found for: HDL No results found for: LDLCALC No results found for: TRIG No results found for: CHOLHDL No results found for: LDLDIRECT    Radiology: No results found.  EKG:   ASSESSMENT AND PLAN:   1. Borderline elevated troponin, likely demand supply ischemia, secondary to GI bleed 2. Chest discomfort, with palpitations, with atypical features  Recommendations  1. Agree with current therapy 2. Continue to cycle cardiac enzymes 3. Defer full dose anticoagulation especially in light of GI bleed 4. Proceed with GI workup as  planned  Signed: Isaias Cowman MD,PhD, [facility] 07/22/2016, 11:49 AM

## 2016-07-22 NOTE — ED Triage Notes (Signed)
Pt reports that since 0130 she has been having vomiting dark colored emesis and has been feeling like her heart is racing.

## 2016-07-22 NOTE — Progress Notes (Signed)
Patient fingerstick 523 paged Dr. Benjie Karvonen and received orders to give 20 units of novo log now.

## 2016-07-22 NOTE — ED Notes (Signed)
This RN returned call to floor. Informed nurse that the Sandostatin was not sent to the ED prior to the pt being transported to the floor.

## 2016-07-23 ENCOUNTER — Inpatient Hospital Stay
Admit: 2016-07-23 | Discharge: 2016-07-23 | Disposition: A | Discharge status: Home / Self Care | Payer: Medicare Other | Attending: Internal Medicine | Admitting: Internal Medicine

## 2016-07-23 ENCOUNTER — Encounter: Payer: Self-pay | Admitting: Gastroenterology

## 2016-07-23 ENCOUNTER — Inpatient Hospital Stay: Payer: Medicare Other

## 2016-07-23 DIAGNOSIS — K7469 Other cirrhosis of liver: Principal | ICD-10-CM

## 2016-07-23 LAB — GLUCOSE, CAPILLARY
GLUCOSE-CAPILLARY: 230 mg/dL — AB (ref 65–99)
GLUCOSE-CAPILLARY: 318 mg/dL — AB (ref 65–99)
Glucose-Capillary: 270 mg/dL — ABNORMAL HIGH (ref 65–99)
Glucose-Capillary: 198 mg/dL — ABNORMAL HIGH (ref 65–99)

## 2016-07-23 LAB — BASIC METABOLIC PANEL
ANION GAP: 6 (ref 5–15)
BUN: 21 mg/dL — ABNORMAL HIGH (ref 6–20)
CHLORIDE: 108 mmol/L (ref 101–111)
CO2: 25 mmol/L (ref 22–32)
Calcium: 8.2 mg/dL — ABNORMAL LOW (ref 8.9–10.3)
Creatinine, Ser: 0.79 mg/dL (ref 0.44–1.00)
GFR calc non Af Amer: >60 mL/min (ref >60)
Glucose, Bld: 182 mg/dL — ABNORMAL HIGH (ref 65–99)
POTASSIUM: 3.9 mmol/L (ref 3.5–5.1)
Sodium: 139 mmol/L (ref 135–145)

## 2016-07-23 LAB — ECHOCARDIOGRAM COMPLETE
EERAT: 20.56
EWDT: 225 ms
FS: 43 % (ref 28–44)
Height: 67 in
IVS/LV PW RATIO, ED: 1.03
LA ID, A-P, ES: 46 mm
LA diam index: 2.14 cm/m2
LA vol index: 24.9 mL/m2
LAVOL: 53.5 mL
LAVOLA4C: 51 mL
LEFT ATRIUM END SYS DIAM: 46 mm
LV E/e' medial: 20.56
LV E/e'average: 20.56
LV PW d: 12.5 mm — AB (ref 0.6–1.1)
LV TDI E'LATERAL: 4.79
LV TDI E'MEDIAL: 4.79
LVELAT: 4.79 cm/s
MV Dec: 225
MV pk A vel: 127 m/s
MVAP: 3.33 cm2
MVPG: 4 mmHg
MVPKEVEL: 98.5 m/s
P 1/2 time: 66 ms
RV LATERAL S' VELOCITY: 19 cm/s
RV TAPSE: 23 mm
Weight: 3347.2 oz

## 2016-07-23 LAB — CBC
HEMATOCRIT: 24.3 % — AB (ref 35.0–47.0)
HEMOGLOBIN: 8.2 g/dL — AB (ref 12.0–16.0)
MCH: 28.8 pg (ref 26.0–34.0)
MCHC: 33.8 g/dL (ref 32.0–36.0)
MCV: 85.1 fL (ref 80.0–100.0)
Platelets: 90 10*3/uL — ABNORMAL LOW (ref 150–440)
RBC: 2.86 MIL/uL — AB (ref 3.80–5.20)
RDW: 16.5 % — ABNORMAL HIGH (ref 11.5–14.5)
WBC: 6.1 10*3/uL (ref 3.6–11.0)

## 2016-07-23 LAB — HEMOGLOBIN AND HEMATOCRIT, BLOOD
HCT: 24.1 % — ABNORMAL LOW (ref 35.0–47.0)
HEMATOCRIT: 22 % — AB (ref 35.0–47.0)
Hemoglobin: 7.9 g/dL — ABNORMAL LOW (ref 12.0–16.0)
Hemoglobin: 7.4 g/dL — ABNORMAL LOW (ref 12.0–16.0)

## 2016-07-23 NOTE — Progress Notes (Signed)
[facility] [place] NAME: Vanessa Oneill    MR#:  [id number]  DATE OF BIRTH:  1950-09-23  SUBJECTIVE:   Patient here due to coffee-ground emesis and melanotic stools and suspected to have an upper GI bleed. Patient has a history of nonalcoholic steatohepatitis and cirrhosis with history of esophageal varices. No acute bleeding this morning. Hemoglobin stable after being transfused 2 units. Seen by gastroenterology and plan for upper GI endoscopy tomorrow. Patient is being maintained on octreotide and Protonix drip.  REVIEW OF SYSTEMS:    Review of Systems  Constitutional: Negative for chills and fever.  HENT: Negative for congestion and tinnitus.   Eyes: Negative for blurred vision and double vision.  Respiratory: Negative for cough, shortness of breath and wheezing.   Cardiovascular: Negative for chest pain, orthopnea and PND.  Gastrointestinal: Positive for melena. Negative for abdominal pain, diarrhea, nausea and vomiting.  Genitourinary: Negative for dysuria and hematuria.  Neurological: Negative for dizziness, sensory change and focal weakness.  All other systems reviewed and are negative.   Nutrition: Clear Liquids Tolerating Diet: yes Tolerating PT: Await Eval.   DRUG ALLERGIES:   Allergies  Allergen Reactions  . Tylenol [Acetaminophen] Other (See Comments)    "tylenol 3" chest pains  . Amoxicillin Itching and Rash    VITALS:  Blood pressure (!) 103/49, pulse 60, temperature 97.9 F (36.6 C), temperature source Oral, resp. rate 18, height 5\' 7"  (1.702 m), weight 94.9 kg (209 lb 3.2 oz), SpO2 98 %.  PHYSICAL EXAMINATION:   Physical Exam  GENERAL:  66 y.o.-year-old obese patient lying in bed in no acute distress.  EYES: Pupils equal, round, reactive to light and accommodation. No scleral icterus. Extraocular muscles intact.  HEENT: Head atraumatic, normocephalic. Oropharynx and nasopharynx clear.  NECK:  Supple, no jugular  venous distention. No thyroid enlargement, no tenderness.  LUNGS: Normal breath sounds bilaterally, no wheezing, rales, rhonchi. No use of accessory muscles of respiration.  CARDIOVASCULAR: S1, S2 normal. No murmurs, rubs, or gallops.  ABDOMEN: Soft, nontender, nondistended. Bowel sounds present. No organomegaly or mass.  EXTREMITIES: No cyanosis, clubbing or edema b/l.    NEUROLOGIC: Cranial nerves II through XII are intact. No focal Motor or sensory deficits b/l.  Globally weak PSYCHIATRIC: The patient is alert and oriented x 3.  SKIN: No obvious rash, lesion, or ulcer.    LABORATORY PANEL:   CBC  Recent Labs Lab 07/23/16 0337 07/23/16 1025  WBC 6.1  --   HGB 8.2* 7.9*  HCT 24.3* 24.1*  PLT 90*  --    ------------------------------------------------------------------------------------------------------------------  Chemistries   Recent Labs Lab 07/22/16 0727 07/23/16 0337  NA 133* 139  K 4.7 3.9  CL 101 108  CO2 22 25  GLUCOSE 478* 182*  BUN 29* 21*  CREATININE 0.95 0.79  CALCIUM 9.1 8.2*  AST 33  --   ALT 27  --   ALKPHOS 89  --   BILITOT 1.3*  --    ------------------------------------------------------------------------------------------------------------------  Cardiac Enzymes  Recent Labs Lab 07/22/16 2157  TROPONINI 0.06*   ------------------------------------------------------------------------------------------------------------------  RADIOLOGY:  No results found.   ASSESSMENT AND PLAN:   66 year old female with past medical history of nonalcoholic steatohepatitis with cirrhosis, hypertension, diabetes, COPD who presented to the hospital due to coffee-ground emesis and melanotic stools.   1. Upper GI bleed-this the cause of patient's coffee-ground emesis and melanotic stools. Patient has a history of liver cirrhosis with esophageal varices. No acute hematemesis this  morning when overnight. -Patient transfused 2 units of packed red blood  cells and hemoglobin remained stable. Discussed with gastroenterology and plan for upper GI endoscopy tomorrow. Placed on clear liquid diet. -Continue octreotide, Protonix drip. Cont. Nadolol Follow hg.  -Given history of esophageal variceal bleeding continue empiric ceftriaxone.  2. Acute blood loss anemia-secondary to the GI bleed. -Patient has been transfused 2 units packed red blood cells, hemoglobin improved. We'll continue to monitor.  3. Diabetes type 2 without complication-continue Levemir, sliding scale insulin. Blood sugar stable.  4. Restless leg syndrome-continue Requip. Next  5. Elevated troponin-in the setting of supply demand ischemia from GI bleeding. Seen by cardiology and no plans for acute intervention.   6. Depression - cont. Lexapro.   7. Neuropathy - cont. Neurontin.      All the records are reviewed and case discussed with Care Management/Social Worker. Management plans discussed with the patient, family and they are in agreement.  CODE STATUS: Full code  DVT Prophylaxis: Ted's & SCD's.   TOTAL TIME TAKING CARE OF THIS PATIENT: 30 minutes.   POSSIBLE D/C IN 1-2 DAYS, DEPENDING ON CLINICAL CONDITION.   Henreitta Leber M.D on 07/23/2016 at 2:48 PM  Between 7am to 6pm - Pager - [phone]  After 6pm go to [email or web address] - Proofreader  [facility]  Office  [phone]  CC: Primary care physician; Adin Hector, MD

## 2016-07-23 NOTE — Progress Notes (Signed)
[facility] Cardiology  SUBJECTIVE: The patient denies chest pain or shortness of breath   Vitals:   07/22/16 2047 07/22/16 2056 07/23/16 0610 07/23/16 1342  BP: (!) 102/48 (!) 124/51 (!) 115/47 (!) 103/49  Pulse: 100 100 88 60  Resp: 17  18 18   Temp: 98.4 F (36.9 C)  98.7 F (37.1 C) 97.9 F (36.6 C)  TempSrc: Oral  Oral Oral  SpO2: 93% 98% 93% 98%  Weight:   94.9 kg (209 lb 3.2 oz)   Height:         Intake/Output Summary (Last 24 hours) at 07/23/16 1431 Last data filed at 07/23/16 1341  Gross per 24 hour  Intake           3004.2 ml  Output             3075 ml  Net            -70.8 ml      PHYSICAL EXAM  General: Well developed, well nourished, in no acute distress HEENT:  Normocephalic and atramatic Neck:  No JVD.  Lungs: Clear bilaterally to auscultation and percussion. Heart: HRRR . Normal S1 and S2 without gallops or murmurs.  Abdomen: Bowel sounds are positive, abdomen soft and non-tender  Msk:  Back normal, normal gait. Normal strength and tone for age. Extremities: No clubbing, cyanosis or edema.   Neuro: Alert and oriented X 3. Psych:  Good affect, responds appropriately   LABS: Basic Metabolic Panel:  Recent Labs  07/22/16 0727 07/23/16 0337  NA 133* 139  K 4.7 3.9  CL 101 108  CO2 22 25  GLUCOSE 478* 182*  BUN 29* 21*  CREATININE 0.95 0.79  CALCIUM 9.1 8.2*   Liver Function Tests:  Recent Labs  07/22/16 0727  AST 33  ALT 27  ALKPHOS 89  BILITOT 1.3*  PROT 6.8  ALBUMIN 3.5    Recent Labs  07/22/16 0727  LIPASE 25   CBC:  Recent Labs  07/22/16 0727  07/23/16 0337 07/23/16 1025  WBC 8.6  --  6.1  --   HGB 10.4*  < > 8.2* 7.9*  HCT 31.2*  < > 24.3* 24.1*  MCV 86.5  --  85.1  --   PLT 122*  --  90*  --   < > = values in this interval not displayed. Cardiac Enzymes:  Recent Labs  07/22/16 1002 07/22/16 1553 07/22/16 2157  TROPONINI 0.06* 0.08* 0.06*   BNP: Invalid input(s): POCBNP D-Dimer: No results for input(s):  DDIMER in the last 72 hours. Hemoglobin A1C: No results for input(s): HGBA1C in the last 72 hours. Fasting Lipid Panel: No results for input(s): CHOL, HDL, LDLCALC, TRIG, CHOLHDL, LDLDIRECT in the last 72 hours. Thyroid Function Tests: No results for input(s): TSH, T4TOTAL, T3FREE, THYROIDAB in the last 72 hours.  Invalid input(s): FREET3 Anemia Panel: No results for input(s): VITAMINB12, FOLATE, FERRITIN, TIBC, IRON, RETICCTPCT in the last 72 hours.  No results found.   Echo LV EF 55-65%  TELEMETRY: Normal sinus rhythm:  ASSESSMENT AND PLAN:  Active Problems:   GIB (gastrointestinal bleeding)    1. Borderline elevated troponin, and the absence of chest pain, likely demand supply ischemia, secondary to GI bleed and marked anemia with normal LV function by 2-D echocardiogram 2. GI bleed, likely secondary to esophageal varices with underlying nonalcoholic cirrhosis, scheduled for EGD tomorrow  Recommendations  1. Continue current medications 2. Proceed with EGD as planned 3. Defer further cardiac diagnostics at this  time  Signed off for now, please call if any questions   Isaias Cowman, MD, PhD, [facility] 07/23/2016 2:31 PM

## 2016-07-23 NOTE — Consult Note (Signed)
Reason for Consult:Coffee-ground emesis Referring Physician: Dr. Sid Falcon Vanessa Oneill is an 66 y.o. female.  HPI: Seen in consultation at the request of Dr. Verdell Carmine. The history is obtained through the patient and review of her medical record.  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 Last EGD was performed by Dr. Rayann Heman 05/12/16 where he describes grade 1 esophageal varices. She has had no other complications related to her liver disease. Last abdominal imaging was 12/08/2014 showing cirrhosis and a CBD 10.9.  She presented to the ED yesterday after she awoke with coffee-ground emesis at 1am after 1-2 weeks of melena. She because so weak that she was unable to stand in the shower. Her husband brought her to there ED.  She is currently feeling better. She has some mild nausea. No further vomiting or overt GI blood loss. GI ROS is otherwise negative. She is hungry and wants to eat more than liquids. She denies the use of any NSAIDs or anticoagulants.   Hgb on arrival was 10.4. It was 7.9 this morning. She was emperically started on octreotide, PPI, and antibiotics. She was also found to have an elevated troponin. She was seen by cardiology and it is thought to be demand ischemia. Cardiology has no plans for acute intervention.  Endoscopy history available in EPIC: EGD 05/12/13 Dr. 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 COPD (chronic obstructive pulmonary disease) ([place])   . Diabetes mellitus without complication ([place])   . Hypertension   . 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[place])   . Personal history of tobacco use, presenting hazards to health 11/12/2014    Past Surgical History:  Procedure  Laterality Date  . ABDOMINAL HYSTERECTOMY    . BREAST BIOPSY Right    neg  . CHOLECYSTECTOMY      Family History  Problem Relation Age of Onset  . Lung cancer Father     Social History:  reports that she has been smoking Cigarettes.  She has a 48.00 pack-year smoking history. She has never used smokeless tobacco. She reports that she does not drink alcohol. Her drug history is not on file.  Allergies:  Allergies  Allergen Reactions  . Tylenol [Acetaminophen] Other (See Comments)    "tylenol 3" chest pains  . Amoxicillin Itching and Rash    Medications:  I have reviewed the patient's current medications. Prior to Admission:  Prescriptions Prior to Admission  Medication Sig Dispense Refill Last Dose  . ALPRAZolam (XANAX) 0.25 MG tablet Take 0.25 mg by mouth at bedtime as needed for sleep.  5 PRN at PRN  . aspirin EC 81 MG tablet Take 81 mg by mouth daily.   07/21/2016 at 2000  . buPROPion (WELLBUTRIN XL) 150 MG 24 hr tablet Take 150 mg by mouth 2 (two) times daily.  10 07/21/2016 at 2000  . cyclobenzaprine (FLEXERIL) 10 MG tablet Take 10 mg by mouth 3 (three) times daily as needed for muscle spasms.   PRN at PRN  . escitalopram (LEXAPRO) 10 MG tablet Take 10 mg by mouth daily.  11 07/21/2016 at 0800  . gabapentin (NEURONTIN) 100 MG capsule Take 100 mg by mouth 2 (two) times daily.   07/21/2016 at 2000  .  glimepiride (AMARYL) 4 MG tablet Take 4 mg by mouth daily with breakfast.   07/21/2016 at 0800  . hydrochlorothiazide (HYDRODIURIL) 25 MG tablet Take 25 mg by mouth daily.  4 07/21/2016 at 0800  . HYDROcodone-acetaminophen (NORCO/VICODIN) 5-325 MG tablet Take 1 tablet by mouth every 6 (six) hours as needed for moderate pain.   PRN at PRN  . insulin NPH Human (HUMULIN N,NOVOLIN N) 100 UNIT/ML injection Inject 20-30 Units into the skin 3 (three) times daily - between meals and at bedtime. Use 30 units in the morning, 20 units during the day and 30 units at night.   07/21/2016 at 2000  .  insulin regular (NOVOLIN R,HUMULIN R) 100 units/mL injection Inject 20 Units into the skin 2 (two) times daily before lunch and supper.    07/21/2016 at 1600  . ketorolac (TORADOL) 10 MG tablet Take 10 mg by mouth daily as needed for headache.   PRN at PRN  . losartan (COZAAR) 100 MG tablet Take 100 mg by mouth daily.   07/21/2016 at 0800  . metFORMIN (GLUCOPHAGE-XR) 500 MG 24 hr tablet Take 1,000 mg by mouth daily.   07/21/2016 at 2000  . nadolol (CORGARD) 80 MG tablet Take 80 mg by mouth daily.   07/21/2016 at 2000  . pantoprazole (PROTONIX) 40 MG tablet Take 40 mg by mouth 2 (two) times daily.    07/21/2016 at 2000  . rOPINIRole (REQUIP) 1 MG tablet Take 1 mg by mouth at bedtime.    07/21/2016 at 2000  . traZODone (DESYREL) 50 MG tablet Take 50 mg by mouth at bedtime as needed for sleep.  0 PRN at PRN  . SUMAtriptan (IMITREX) 50 MG tablet Take 1 tablet (50 mg total) by mouth every 2 (two) hours as needed for migraine. May repeat in 2 hours if headache persists or recurs. (Patient not taking: Reported on 07/22/2016) 10 tablet 0 Not Taking   Scheduled: . buPROPion  150 mg Oral BID  . escitalopram  10 mg Oral Daily  . gabapentin  100 mg Oral BID  . hydrochlorothiazide  25 mg Oral Daily  . insulin aspart  0-15 Units Subcutaneous TID WC  . insulin detemir  10 Units Subcutaneous BID BM & HS  . nadolol  80 mg Oral Daily  . octreotide  50 mcg Intravenous Once  . rOPINIRole  1 mg Oral QHS   Continuous: . sodium chloride 75 mL/hr at 07/23/16 1256  . cefTRIAXone (ROCEPHIN)  IV Stopped (07/23/16 1254)  . octreotide  (SANDOSTATIN)    IV infusion 25 mcg/hr (07/23/16 0906)  . pantoprozole (PROTONIX) infusion 8 mg/hr (07/23/16 0747)   XTK:WIOXBDZHGD, cyclobenzaprine, HYDROcodone-acetaminophen, ondansetron **OR** ondansetron (ZOFRAN) IV, senna-docusate, SUMAtriptan, traZODone  Results for orders placed or performed during the hospital encounter of 07/22/16 (from the past 48 hour(s))  CBC     Status:  Abnormal   Collection Time: 07/22/16  7:27 AM  Result Value Ref Range   WBC 8.6 3.6 - 11.0 K/uL   RBC 3.61 (L) 3.80 - 5.20 MIL/uL   Hemoglobin 10.4 (L) 12.0 - 16.0 g/dL   HCT 31.2 (L) 35.0 - 47.0 %   MCV 86.5 80.0 - 100.0 fL   MCH 28.9 26.0 - 34.0 pg   MCHC 33.4 32.0 - 36.0 g/dL   RDW 16.8 (H) 11.5 - 14.5 %   Platelets 122 (L) 150 - 440 K/uL  Comprehensive metabolic panel     Status: Abnormal   Collection Time: 07/22/16  7:27 AM  Result Value Ref Range   Sodium 133 (L) 135 - 145 mmol/L   Potassium 4.7 3.5 - 5.1 mmol/L   Chloride 101 101 - 111 mmol/L   CO2 22 22 - 32 mmol/L   Glucose, Bld 478 (H) 65 - 99 mg/dL   BUN 29 (H) 6 - 20 mg/dL   Creatinine, Ser 0.95 0.44 - 1.00 mg/dL   Calcium 9.1 8.9 - 10.3 mg/dL   Total Protein 6.8 6.5 - 8.1 g/dL   Albumin 3.5 3.5 - 5.0 g/dL   AST 33 15 - 41 U/L   ALT 27 14 - 54 U/L   Alkaline Phosphatase 89 38 - 126 U/L   Total Bilirubin 1.3 (H) 0.3 - 1.2 mg/dL   GFR calc non Af Amer >60 >60 mL/min   GFR calc Af Amer >60 >60 mL/min    Comment: (NOTE) The eGFR has been calculated using the CKD EPI equation. This calculation has not been validated in all clinical situations. eGFR's persistently <60 mL/min signify possible Chronic Kidney Disease.    Anion gap 10 5 - 15  Lipase, blood     Status: None   Collection Time: 07/22/16  7:27 AM  Result Value Ref Range   Lipase 25 11 - 51 U/L  Troponin I     Status: Abnormal   Collection Time: 07/22/16  7:27 AM  Result Value Ref Range   Troponin I 0.06 (HH) <0.03 ng/mL    Comment: CRITICAL RESULT CALLED TO, READ BACK BY AND VERIFIED WITH KIM MAIN @ 0833 07/22/16 BY [facility]   Protime-INR     Status: None   Collection Time: 07/22/16  7:27 AM  Result Value Ref Range   Prothrombin Time 15.2 11.4 - 15.2 seconds   INR 1.19   APTT     Status: None   Collection Time: 07/22/16  7:27 AM  Result Value Ref Range   aPTT 30 24 - 36 seconds  Type and screen [facility]     Status: None    Collection Time: 07/22/16  9:22 AM  Result Value Ref Range   ABO/RH(D) O POS    Antibody Screen NEG    Sample Expiration 07/25/2016   Troponin I     Status: Abnormal   Collection Time: 07/22/16 10:02 AM  Result Value Ref Range   Troponin I 0.06 (HH) <0.03 ng/mL    Comment: CRITICAL VALUE NOTED. VALUE IS CONSISTENT WITH PREVIOUSLY REPORTED/CALLED VALUE [place]  Hemoglobin and hematocrit, blood     Status: Abnormal   Collection Time: 07/22/16 11:12 AM  Result Value Ref Range   Hemoglobin 9.4 (L) 12.0 - 16.0 g/dL   HCT 27.9 (L) 35.0 - 47.0 %  Glucose, capillary     Status: Abnormal   Collection Time: 07/22/16 12:41 PM  Result Value Ref Range   Glucose-Capillary 523 (HH) 65 - 99 mg/dL  Glucose, capillary     Status: Abnormal   Collection Time: 07/22/16  1:59 PM  Result Value Ref Range   Glucose-Capillary 514 (HH) 65 - 99 mg/dL   Comment 1 Notify RN   Troponin I     Status: Abnormal   Collection Time: 07/22/16  3:53 PM  Result Value Ref Range   Troponin I 0.08 (HH) <0.03 ng/mL    Comment: CRITICAL VALUE NOTED. VALUE IS CONSISTENT WITH PREVIOUSLY REPORTED/CALLED VALUE [facility]  Glucose, capillary     Status: Abnormal   Collection Time: 07/22/16  4:58 PM  Result Value Ref Range  Glucose-Capillary 323 (H) 65 - 99 mg/dL   Comment 1 Notify RN   Hemoglobin and hematocrit, blood     Status: Abnormal   Collection Time: 07/22/16  7:03 PM  Result Value Ref Range   Hemoglobin 8.5 (L) 12.0 - 16.0 g/dL   HCT 25.4 (L) 35.0 - 47.0 %  Glucose, capillary     Status: Abnormal   Collection Time: 07/22/16  9:15 PM  Result Value Ref Range   Glucose-Capillary 304 (H) 65 - 99 mg/dL  Troponin I     Status: Abnormal   Collection Time: 07/22/16  9:57 PM  Result Value Ref Range   Troponin I 0.06 (HH) <0.03 ng/mL    Comment: CRITICAL VALUE NOTED. VALUE IS CONSISTENT WITH PREVIOUSLY REPORTED/CALLED VALUE [facility]   Basic metabolic panel     Status: Abnormal   Collection Time: 07/23/16  3:37 AM  Result Value Ref  Range   Sodium 139 135 - 145 mmol/L   Potassium 3.9 3.5 - 5.1 mmol/L   Chloride 108 101 - 111 mmol/L   CO2 25 22 - 32 mmol/L   Glucose, Bld 182 (H) 65 - 99 mg/dL   BUN 21 (H) 6 - 20 mg/dL   Creatinine, Ser 0.79 0.44 - 1.00 mg/dL   Calcium 8.2 (L) 8.9 - 10.3 mg/dL   GFR calc non Af Amer >60 >60 mL/min   GFR calc Af Amer >60 >60 mL/min    Comment: (NOTE) The eGFR has been calculated using the CKD EPI equation. This calculation has not been validated in all clinical situations. eGFR's persistently <60 mL/min signify possible Chronic Kidney Disease.    Anion gap 6 5 - 15  CBC     Status: Abnormal   Collection Time: 07/23/16  3:37 AM  Result Value Ref Range   WBC 6.1 3.6 - 11.0 K/uL   RBC 2.86 (L) 3.80 - 5.20 MIL/uL   Hemoglobin 8.2 (L) 12.0 - 16.0 g/dL   HCT 24.3 (L) 35.0 - 47.0 %   MCV 85.1 80.0 - 100.0 fL   MCH 28.8 26.0 - 34.0 pg   MCHC 33.8 32.0 - 36.0 g/dL   RDW 16.5 (H) 11.5 - 14.5 %   Platelets 90 (L) 150 - 440 K/uL  Glucose, capillary     Status: Abnormal   Collection Time: 07/23/16  8:00 AM  Result Value Ref Range   Glucose-Capillary 230 (H) 65 - 99 mg/dL   Comment 1 Notify RN   Hemoglobin and hematocrit, blood     Status: Abnormal   Collection Time: 07/23/16 10:25 AM  Result Value Ref Range   Hemoglobin 7.9 (L) 12.0 - 16.0 g/dL   HCT 24.1 (L) 35.0 - 47.0 %  Glucose, capillary     Status: Abnormal   Collection Time: 07/23/16 11:52 AM  Result Value Ref Range   Glucose-Capillary 318 (H) 65 - 99 mg/dL    No results found.  Review of Systems  Constitutional: Negative for chills and fever.  HENT: Negative for hearing loss and tinnitus.   Eyes: Negative for blurred vision and double vision.  Respiratory: Positive for shortness of breath. Negative for cough.   Cardiovascular: Positive for leg swelling. Negative for chest pain and palpitations.  Gastrointestinal: Positive for melena, nausea and vomiting.  Genitourinary: Negative for dysuria and hematuria.   Musculoskeletal: Negative for myalgias and neck pain.  Skin: Negative for itching and rash.  Neurological: Negative for dizziness and headaches.  Endo/Heme/Allergies: Negative for polydipsia. Does not bruise/bleed easily.  Psychiatric/Behavioral: Negative for depression. The patient is not nervous/anxious.    Blood pressure (!) 103/49, pulse 60, temperature 97.9 F (36.6 C), temperature source Oral, resp. rate 18, height 5' 7" (1.702 m), weight 209 lb 3.2 oz (94.9 kg), SpO2 98 %. Physical Exam  Nursing note and vitals reviewed. Constitutional: She is oriented to person, place, and time. She appears well-developed and well-nourished. No distress.  HENT:  Head: Normocephalic and atraumatic.  Mouth/Throat: No oropharyngeal exudate.  Eyes: Conjunctivae are normal. No scleral icterus.  Neck: Neck supple. No thyromegaly present.  Cardiovascular: Normal rate and regular rhythm.   Respiratory: Effort normal and breath sounds normal.  GI: Soft. Bowel sounds are normal. She exhibits no distension and no mass. There is no tenderness. There is no rebound and no guarding.  Central obesity. No obvious fluid wave.  Musculoskeletal: Normal range of motion. She exhibits no edema.  Neurological: She is alert and oriented to person, place, and time.  No asterixis or clonus.  Skin: Skin is dry. No erythema.  Palmar erythema present. No spider angiomas.  Psychiatric: She has a normal mood and affect. Thought content normal.    Assessment/Plan:  GI bleed blood loss anemia presenting with melena and coffee-ground emesis    - BUN elevated at 21 Child's A cirrhosis, MELD 9 due to NASH Esophageal varices with history of bleed 2015    - variceal ligation 3-4 times in 2015    - on outpatient nadolol Thrombocytopenia/hypersplenism DM History of colon polyps    - sessile serrated adenoma 2011    - TVA, TA 2016, surveillance recommended in 2019  Suspected UGI bleed. Differential: varices, portal  hypertensive gastropathy/punctate GAVE, esophagitis, gastritis, and less likely malignancy  Recommend: Empiric PPI and octretide, 5 days of antibiotics. Will plan EGD +/- banding 07/24/16. Nadolol as her blood pressure will allow. Will also proceed with an ultrasound for [facility] screening, as her last imaging was in 2016 (and confirmed by the patient). Clear liquids today and NPO at midnight.   The patient was consented at the bedside. We discussed risks, benefits, and alternatives to endoscopic therapy. In particular, we discussed the risks that include, but are not limited to, reaction to medication, cardiopulmonary compromise, bleeding requiring blood transfusion.  Thank you for allowing me to participate in Mrs. Mongillo' care. Please call with any questions or concerns.     [place] 07/23/2016, 3:50 PM

## 2016-07-24 ENCOUNTER — Encounter: Payer: Self-pay | Admitting: Anesthesiology

## 2016-07-24 ENCOUNTER — Inpatient Hospital Stay: Payer: Medicare Other | Admitting: Anesthesiology

## 2016-07-24 ENCOUNTER — Encounter
Admission: EM | Disposition: A | Discharge status: Home / Self Care | Payer: Self-pay | Source: Home / Self Care | Attending: Internal Medicine

## 2016-07-24 HISTORY — PX: ESOPHAGOGASTRODUODENOSCOPY (EGD) WITH PROPOFOL: SHX5813

## 2016-07-24 LAB — GLUCOSE, CAPILLARY
GLUCOSE-CAPILLARY: 143 mg/dL — AB (ref 65–99)
GLUCOSE-CAPILLARY: 96 mg/dL (ref 65–99)
GLUCOSE-CAPILLARY: 123 mg/dL — AB (ref 65–99)
Glucose-Capillary: 126 mg/dL — ABNORMAL HIGH (ref 65–99)
Glucose-Capillary: 130 mg/dL — ABNORMAL HIGH (ref 65–99)
Glucose-Capillary: 113 mg/dL — ABNORMAL HIGH (ref 65–99)

## 2016-07-24 LAB — CBC
HEMATOCRIT: 22.6 % — AB (ref 35.0–47.0)
Hemoglobin: 7.7 g/dL — ABNORMAL LOW (ref 12.0–16.0)
MCH: 29.4 pg (ref 26.0–34.0)
MCHC: 34.2 g/dL (ref 32.0–36.0)
MCV: 85.9 fL (ref 80.0–100.0)
PLATELETS: 88 10*3/uL — AB (ref 150–440)
RBC: 2.63 MIL/uL — AB (ref 3.80–5.20)
RDW: 16.5 % — ABNORMAL HIGH (ref 11.5–14.5)
WBC: 4.8 10*3/uL (ref 3.6–11.0)

## 2016-07-24 LAB — ABO/RH: ABO/RH(D): O POS

## 2016-07-24 LAB — PREPARE RBC (CROSSMATCH)

## 2016-07-24 SURGERY — ESOPHAGOGASTRODUODENOSCOPY (EGD) WITH PROPOFOL
Anesthesia: General

## 2016-07-24 MED ORDER — PROMETHAZINE HCL 25 MG/ML IJ SOLN
INTRAMUSCULAR | Status: AC
  Administered 2016-07-24: 6.25 mg
  Filled 2016-07-24: qty 1

## 2016-07-24 MED ORDER — LIDOCAINE 2% (20 MG/ML) 5 ML SYRINGE
INTRAMUSCULAR | Status: DC | PRN
  Administered 2016-07-24: 100 mg via INTRAVENOUS

## 2016-07-24 MED ORDER — GLYCOPYRROLATE 0.2 MG/ML IJ SOLN
INTRAMUSCULAR | Status: DC | PRN
  Administered 2016-07-24: 0.2 mg via INTRAVENOUS

## 2016-07-24 MED ORDER — PROPOFOL 500 MG/50ML IV EMUL
INTRAVENOUS | Status: AC
  Filled 2016-07-24: qty 50

## 2016-07-24 MED ORDER — ONDANSETRON HCL 4 MG/2ML IJ SOLN: 4.0000 mg | Freq: Once | INTRAMUSCULAR | Status: DC | PRN

## 2016-07-24 MED ORDER — PROPOFOL 500 MG/50ML IV EMUL
INTRAVENOUS | Status: DC | PRN
  Administered 2016-07-24: 140 ug/kg/min via INTRAVENOUS

## 2016-07-24 MED ORDER — ONDANSETRON HCL 4 MG/2ML IJ SOLN
INTRAMUSCULAR | Status: DC | PRN
  Administered 2016-07-24: 4 mg via INTRAVENOUS

## 2016-07-24 MED ORDER — FENTANYL CITRATE (PF) 100 MCG/2ML IJ SOLN: 25.0000 ug | INTRAMUSCULAR | Status: DC | PRN

## 2016-07-24 MED ORDER — SODIUM CHLORIDE 0.9 % IV SOLN
INTRAVENOUS | Status: DC
  Administered 2016-07-24: 09:00:00 via INTRAVENOUS

## 2016-07-24 MED ORDER — SODIUM CHLORIDE 0.9 % IV SOLN
Freq: Once | INTRAVENOUS | Status: AC
  Administered 2016-07-24: 14:00:00 via INTRAVENOUS

## 2016-07-24 MED ORDER — PROPOFOL 10 MG/ML IV BOLUS
INTRAVENOUS | Status: DC | PRN
  Administered 2016-07-24: 70 mg via INTRAVENOUS

## 2016-07-24 MED ORDER — SUCRALFATE 1 GM/10ML PO SUSP
1.0000 g | Freq: Four times a day (QID) | ORAL | Status: DC | PRN
  Administered 2016-07-25 (×3): 1 g via ORAL
  Filled 2016-07-24 (×3): qty 10

## 2016-07-24 MED ORDER — PROMETHAZINE HCL 25 MG/ML IJ SOLN
6.2500 mg | Freq: Once | INTRAMUSCULAR | Status: AC
  Administered 2016-07-24: 6.25 mg via INTRAVENOUS

## 2016-07-24 NOTE — Op Note (Signed)
[facility] Gastroenterology Patient Name: Vanessa Oneill Procedure Date: 07/24/2016 9:09 AM MRN: [id number] Account #: [id number] Date of Birth: 10-21-1950 Admit Type: Inpatient Age: 66 Room: [facility] ENDO ROOM 4 Gender: Female Note Status: Finalized Procedure:            Upper GI endoscopy Indications:          Hematemesis Providers:            Jonathon Bellows MD, MD Referring MD:         Ramonita Lab, MD (Referring MD) Medicines:            Monitored Anesthesia Care Complications:        No immediate complications. Procedure:            Pre-Anesthesia Assessment:                       - Prior to the procedure, a History and Physical was                        performed, and patient medications, allergies and                        sensitivities were reviewed. The patient's tolerance of                        previous anesthesia was reviewed.                       - The risks and benefits of the procedure and the                        sedation options and risks were discussed with the                        patient. All questions were answered and informed                        consent was obtained.                       - After reviewing the risks and benefits, the patient                        was deemed in satisfactory condition to undergo the                        procedure.                       - ASA Grade Assessment: III - A patient with severe                        systemic disease.                       After obtaining informed consent, the endoscope was                        passed under direct vision. Throughout the procedure,                        the patient's blood  pressure, pulse, and oxygen                        saturations were monitored continuously. The Endoscope                        was introduced through the mouth, and advanced to the                        third part of duodenum. The upper GI endoscopy was   accomplished with ease. The patient tolerated the                        procedure well. Findings:      The examined duodenum was normal.      The stomach was normal.      Four columns of non-bleeding grade II varices were found in the lower       third of the esophagus,. They were 15 mm in largest diameter. Stigmata       of recent bleeding were evident and red wale signs were present.       Scarring from prior treatment was visible. Six bands were successfully       placed with complete eradication, resulting in deflation of varices.       There was no bleeding during, and at the end, of the procedure. Impression:           - Normal examined duodenum.                       - Normal stomach.                       - Recently bleeding grade II esophageal varices.                        Completely eradicated. Banded.                       - No specimens collected. Recommendation:       - Return patient to hospital ward for ongoing care.                       - 1. Keep NPO today , clears tomorrow morning if no                        evidence of bleeding                       2. Octreotide for 72 hours                       3. Antibiotics for cirrhotic with a GI bleed                       4. Repeat EGD in 2-3 weeks as an outpatient                       5. Resume nadolol for prophylaxsis once off octreotide Procedure Code(s):    --- Professional ---                       31540, Esophagogastroduodenoscopy, flexible, transoral;  with band ligation of esophageal/gastric varices Diagnosis Code(s):    --- Professional ---                       I85.01, Esophageal varices with bleeding                       K92.0, Hematemesis CPT copyright 2016 American Medical Association. All rights reserved. The codes documented in this report are preliminary and upon coder review may  be revised to meet current compliance requirements. Jonathon Bellows, MD Jonathon Bellows MD, MD 07/24/2016  9:30:00 AM This report has been signed electronically. Number of Addenda: 0 Note Initiated On: 07/24/2016 9:09 AM      [facility]

## 2016-07-24 NOTE — Anesthesia Postprocedure Evaluation (Signed)
Anesthesia Post Note  Patient: Vanessa Oneill  Procedure(s) Performed: Procedure(s) (LRB): ESOPHAGOGASTRODUODENOSCOPY (EGD) WITH PROPOFOL (N/A)  Patient location during evaluation: PACU Anesthesia Type: General Level of consciousness: awake and alert and oriented Pain management: pain level controlled Vital Signs Assessment: post-procedure vital signs reviewed and stable Respiratory status: spontaneous breathing Cardiovascular status: blood pressure returned to baseline Anesthetic complications: no     Last Vitals:  Vitals:   07/24/16 1046 07/24/16 1145  BP: 122/64   Pulse: 63 62  Resp: 16   Temp:      Last Pain:  Vitals:   07/24/16 1136  TempSrc:   PainSc: 9                  [name]

## 2016-07-24 NOTE — Anesthesia Post-op Follow-up Note (Cosign Needed)
Anesthesia QCDR form completed.        

## 2016-07-24 NOTE — Anesthesia Preprocedure Evaluation (Signed)
Anesthesia Evaluation  Patient identified by MRN, date of birth, ID band Patient awake    Reviewed: Allergy & Precautions, NPO status , Patient's Chart, lab work & pertinent test results, reviewed documented beta blocker date and time   Airway Mallampati: III  TM Distance: <3 FB     Dental   Pulmonary COPD, Current Smoker,    Pulmonary exam normal        Cardiovascular hypertension, Normal cardiovascular exam     Neuro/Psych negative neurological ROS     GI/Hepatic Neg liver ROS,   Endo/Other  diabetes  Renal/GU negative Renal ROS  negative genitourinary   Musculoskeletal negative musculoskeletal ROS (+)   Abdominal Normal abdominal exam  (+)   Peds negative pediatric ROS (+)  Hematology  (+) anemia ,   Anesthesia Other Findings   Reproductive/Obstetrics                             Anesthesia Physical Anesthesia Plan  ASA: III  Anesthesia Plan: General   Post-op Pain Management:    Induction: Intravenous  PONV Risk Score and Plan:   Airway Management Planned: Nasal Cannula  Additional Equipment:   Intra-op Plan:   Post-operative Plan:   Informed Consent:   Dental advisory given  Plan Discussed with: CRNA and Surgeon  Anesthesia Plan Comments:         Anesthesia Quick Evaluation

## 2016-07-24 NOTE — H&P (Signed)
Jonathon Bellows MD [address] [place], [place] [address] Phone: [phone] Fax : [phone]  Primary Care Physician:  Adin Hector, MD Primary Gastroenterologist:  Dr. Jonathon Bellows   Pre-Procedure History & Physical: HPI:  Vanessa Oneill is a 66 y.o. female is here for an endoscopy.   Past Medical History:  Diagnosis Date  . COPD (chronic obstructive pulmonary disease) ([place])   . Diabetes mellitus without complication ([place])   . Hypertension   . 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 Personal history of tobacco use, presenting hazards to health 11/12/2014    Past Surgical History:  Procedure Laterality Date  . ABDOMINAL HYSTERECTOMY    . BREAST BIOPSY Right    neg  . CHOLECYSTECTOMY      Prior to Admission medications   Medication Sig Start Date End Date Taking? Authorizing Provider  ALPRAZolam Duanne Moron) 0.25 MG tablet Take 0.25 mg by mouth at bedtime as needed for sleep. 07/06/16  Yes [provider]  aspirin EC 81 MG tablet Take 81 mg by mouth daily.   Yes [provider]  buPROPion (WELLBUTRIN XL) 150 MG 24 hr tablet Take 150 mg by mouth 2 (two) times daily. 06/17/16  Yes [provider]  cyclobenzaprine (FLEXERIL) 10 MG tablet Take 10 mg by mouth 3 (three) times daily as needed for muscle spasms. 09/20/15  Yes [provider]  escitalopram (LEXAPRO) 10 MG tablet Take 10 mg by mouth daily. 07/08/16  Yes [provider]  gabapentin (NEURONTIN) 100 MG capsule Take 100 mg by mouth 2 (two) times daily.   Yes [provider]  glimepiride (AMARYL) 4 MG tablet Take 4 mg by mouth daily with breakfast.   Yes [provider]  hydrochlorothiazide (HYDRODIURIL) 25 MG tablet Take 25 mg by mouth daily. 05/17/16  Yes [provider]  HYDROcodone-acetaminophen (NORCO/VICODIN) 5-325 MG tablet Take 1 tablet by mouth every 6 (six) hours as needed for moderate pain.   Yes [provider]  insulin NPH Human  (HUMULIN N,NOVOLIN N) 100 UNIT/ML injection Inject 20-30 Units into the skin 3 (three) times daily - between meals and at bedtime. Use 30 units in the morning, 20 units during the day and 30 units at night.   Yes [provider]  insulin regular (NOVOLIN R,HUMULIN R) 100 units/mL injection Inject 20 Units into the skin 2 (two) times daily before lunch and supper.    Yes [provider]  ketorolac (TORADOL) 10 MG tablet Take 10 mg by mouth daily as needed for headache. 07/07/16  Yes [provider]  losartan (COZAAR) 100 MG tablet Take 100 mg by mouth daily.   Yes [provider]  metFORMIN (GLUCOPHAGE-XR) 500 MG 24 hr tablet Take 1,000 mg by mouth daily. 03/07/16  Yes [provider]  nadolol (CORGARD) 80 MG tablet Take 80 mg by mouth daily.   Yes [provider]  pantoprazole (PROTONIX) 40 MG tablet Take 40 mg by mouth 2 (two) times daily.    Yes [provider]  rOPINIRole (REQUIP) 1 MG tablet Take 1 mg by mouth at bedtime.    Yes [provider]  traZODone (DESYREL) 50 MG tablet Take 50 mg by mouth at bedtime as needed for sleep. 06/19/16  Yes [provider]  SUMAtriptan (IMITREX) 50 MG tablet Take 1 tablet (50 mg total) by mouth every 2 (two) hours as needed for migraine. May repeat in 2 hours if headache persists or recurs. Patient not taking: Reported  on 07/22/2016 01/28/15   Heriberto Antigua, MD    Allergies as of 07/22/2016 - Review Complete 07/22/2016  Allergen Reaction Noted  . Tylenol [acetaminophen] Other (See Comments) 12/02/2014  . Amoxicillin Itching and Rash 12/02/2014    Family History  Problem Relation Age of Onset  . Lung cancer Father     Social History   Social History  . Marital status: Married    Spouse name: N/A  . Number of children: N/A  . Years of education: N/A   Occupational History  . Not on file.   Social History Main Topics  . Smoking status: Current Every Day Smoker     Packs/day: 1.00    Years: 48.00    Types: Cigarettes  . Smokeless tobacco: Never Used  . Alcohol use No  . Drug use: Unknown  . Sexual activity: Not on file   Other Topics Concern  . Not on file   Social History Narrative  . No narrative on file    Review of Systems: See HPI, otherwise negative ROS  Physical Exam: BP (!) 96/49 (BP Location: Left Arm)   Pulse (!) 54   Temp 98 F (36.7 C) (Oral)   Resp 16   Ht 5\' 7"  (1.702 m)   Wt 207 lb 14.4 oz (94.3 kg)   SpO2 95%   BMI 32.56 kg/m  General:   Alert,  pleasant and cooperative in NAD Head:  Normocephalic and atraumatic. Neck:  Supple; no masses or thyromegaly. Lungs:  Clear throughout to auscultation.    Heart:  Regular rate and rhythm. Abdomen:  Soft, nontender and nondistended. Normal bowel sounds, without guarding, and without rebound.   Neurologic:  Alert and  oriented x4;  grossly normal neurologically.  Impression/Plan: Vanessa Oneill is here for an endoscopy to be performed for upper GI bleed  Risks, benefits, limitations, and alternatives regarding  endoscopy and possible banding  have been reviewed with the patient.  Questions have been answered.  All parties agreeable.   Jonathon Bellows, MD  07/24/2016, 8:35 AM

## 2016-07-24 NOTE — Progress Notes (Addendum)
[facility] NAME: Vanessa Oneill    MR#:  [id number]  DATE OF BIRTH:  19-Sep-1950  SUBJECTIVE:  s/p EGD earlier today, she is somewhat groggy and sleepy as received Phenergan REVIEW OF SYSTEMS:    Review of Systems  Constitutional: Negative for chills and fever.  HENT: Negative for congestion and tinnitus.   Eyes: Negative for blurred vision and double vision.  Respiratory: Negative for cough, shortness of breath and wheezing.   Cardiovascular: Negative for chest pain, orthopnea and PND.  Gastrointestinal: Positive for melena. Negative for abdominal pain, diarrhea, nausea and vomiting.  Genitourinary: Negative for dysuria and hematuria.  Neurological: Negative for dizziness, sensory change and focal weakness.  All other systems reviewed and are negative.   Nutrition: Clear Liquids , starting tomorrow Tolerating Diet: NPO DRUG ALLERGIES:   Allergies  Allergen Reactions  . Tylenol [Acetaminophen] Other (See Comments)    "tylenol 3" chest pains  . Amoxicillin Itching and Rash    VITALS:  Blood pressure 122/64, pulse 63, temperature (!) 97.2 F (36.2 C), temperature source Tympanic, resp. rate 16, height 5\' 7"  (1.702 m), weight 94.3 kg (207 lb 14.4 oz), SpO2 (!) 88 %.  PHYSICAL EXAMINATION:   Physical Exam  GENERAL:  66 y.o.-year-old obese patient lying in bed in no acute distress.  EYES: Pupils equal, round, reactive to light and accommodation. No scleral icterus. Extraocular muscles intact.  HEENT: Head atraumatic, normocephalic. Oropharynx and nasopharynx clear.  NECK:  Supple, no jugular venous distention. No thyroid enlargement, no tenderness.  LUNGS: Normal breath sounds bilaterally, no wheezing, rales, rhonchi. No use of accessory muscles of respiration.  CARDIOVASCULAR: S1, S2 normal. No murmurs, rubs, or gallops.  ABDOMEN: Soft, nontender, nondistended. Bowel sounds present. No organomegaly or mass.  EXTREMITIES: No  cyanosis, clubbing or edema b/l.    NEUROLOGIC: Cranial nerves II through XII are intact. No focal Motor or sensory deficits b/l.  Globally weak PSYCHIATRIC: The patient is alert and oriented x 3.  SKIN: No obvious rash, lesion, or ulcer.  LABORATORY PANEL:   CBC  Recent Labs Lab 07/24/16 0347  WBC 4.8  HGB 7.7*  HCT 22.6*  PLT 88*   ------------------------------------------------------------------------------------------------------------------  Chemistries   Recent Labs Lab 07/22/16 0727 07/23/16 0337  NA 133* 139  K 4.7 3.9  CL 101 108  CO2 22 25  GLUCOSE 478* 182*  BUN 29* 21*  CREATININE 0.95 0.79  CALCIUM 9.1 8.2*  AST 33  --   ALT 27  --   ALKPHOS 89  --   BILITOT 1.3*  --    ------------------------------------------------------------------------------------------------------------------  Cardiac Enzymes  Recent Labs Lab 07/22/16 2157  TROPONINI 0.06*   ------------------------------------------------------------------------------------------------------------------  RADIOLOGY:  US Abdomen Limited Ruq  Result Date: 07/23/2016 CLINICAL DATA:  Cirrhosis EXAM: ULTRASOUND ABDOMEN LIMITED RIGHT UPPER QUADRANT COMPARISON:  CT abdomen/ pelvis dated 11/28/2013 FINDINGS: Gallbladder: Surgically absent. Common bile duct: Diameter: Dilated, measuring 12 mm. Liver: Nodular hepatic contour, reflecting cirrhosis. No focal hepatic lesion is seen. Patent color Doppler flow in the portal vein. Spleen is enlarged, measuring 15.5 x 5.8 x 16.6 cm (calculated splenic volume 785 mL). Trace perihepatic ascites. IMPRESSION: Cirrhosis. Splenomegaly, as described above. Patent portal vein. Trace perihepatic ascites. Status post cholecystectomy. Electronically Signed   By: Julian Hy M.D.   On: 07/23/2016 17:37   ASSESSMENT AND PLAN:   66 year old female with past medical history of nonalcoholic steatohepatitis with cirrhosis, hypertension, diabetes, COPD who presented to  the hospital due to coffee-ground emesis and melanotic stools.  1. Upper GI bleed-this the cause of patient's coffee-ground emesis and melanotic stools. Patient has a history of liver cirrhosis with esophageal varices.  -Patient transfused 2 units of packed red blood cells and hemoglobin remained stable.  - Keep nothing by mouth today. Clear liquid diet starting tomorrow if no further bleed -Continue octreotide for 72 hrs, Protonix drip.  Resume Nadolol for prophylaxis Once stopped octreotide tomorrow -Given history of esophageal variceal bleeding continue empiric ceftriaxone. - EGD showed Normal examined duodenum, Normal stomach, Recently bleeding grade II esophageal varices which were completely eradicated. Banded.  2. Acute blood loss anemia-secondary to the GI bleed. -Patient has been transfused 2 units packed red blood cells, hemoglobin 7.7. We'll order 1 more PRBC for today  3. Diabetes type 2 without complication-continue Levemir, sliding scale insulin. Blood sugar stable.  4. Restless leg syndrome-continue Requip. Next  5. Elevated troponin-in the setting of supply demand ischemia from GI bleeding. Seen by cardiology and no plans for acute intervention.   6. Depression - cont. Lexapro.   7. Neuropathy - cont. Neurontin.      All the records are reviewed and case discussed with Care Management/Social Worker. Management plans discussed with the patient, nursing and they are in agreement.  CODE STATUS: Full code  DVT Prophylaxis: Ted's & SCD's.   TOTAL TIME TAKING CARE OF THIS PATIENT: 30 minutes.   POSSIBLE D/C IN 1-2 DAYS, DEPENDING ON CLINICAL CONDITION.   Max Sane M.D on 07/24/2016 at 10:57 AM  Between 7am to 6pm - Pager - [phone]  After 6pm go to [email or web address] - Proofreader  [facility]  Office  [phone]  CC: Primary care physician; Adin Hector, MD

## 2016-07-24 NOTE — Transfer of Care (Signed)
Immediate Anesthesia Transfer of Care Note  Patient: Vanessa Oneill  Procedure(s) Performed: Procedure(s): ESOPHAGOGASTRODUODENOSCOPY (EGD) WITH PROPOFOL (N/A)  Patient Location: Endoscopy Unit  Anesthesia Type:General  Level of Consciousness: sedated  Airway & Oxygen Therapy: Patient connected to nasal cannula oxygen  Post-op Assessment: Post -op Vital signs reviewed and stable  Post vital signs: stable  Last Vitals:  Vitals:   07/24/16 0836 07/24/16 0930  BP: (!) 99/52 (!) 119/57  Pulse: (!) 54 71  Resp: 18 18  Temp: 37.3 C (!) 36.2 C    Last Pain:  Vitals:   07/24/16 0930  TempSrc: Tympanic  PainSc:          Complications: No apparent anesthesia complications

## 2016-07-25 ENCOUNTER — Encounter: Payer: Self-pay | Admitting: Gastroenterology

## 2016-07-25 DIAGNOSIS — K920 Hematemesis: Secondary | ICD-10-CM

## 2016-07-25 DIAGNOSIS — I8501 Esophageal varices with bleeding: Secondary | ICD-10-CM

## 2016-07-25 LAB — BPAM RBC
BLOOD PRODUCT EXPIRATION DATE: 201808142359
ISSUE DATE / TIME: 201807231402
UNIT TYPE AND RH: 5100

## 2016-07-25 LAB — TYPE AND SCREEN
ABO/RH(D): O POS
Antibody Screen: NEGATIVE
Unit division: 0

## 2016-07-25 LAB — CBC
HCT: 26.9 % — ABNORMAL LOW (ref 35.0–47.0)
Hemoglobin: 9 g/dL — ABNORMAL LOW (ref 12.0–16.0)
MCH: 28.1 pg (ref 26.0–34.0)
MCHC: 33.4 g/dL (ref 32.0–36.0)
MCV: 84 fL (ref 80.0–100.0)
PLATELETS: 78 10*3/uL — AB (ref 150–440)
RBC: 3.2 MIL/uL — AB (ref 3.80–5.20)
RDW: 17.1 % — AB (ref 11.5–14.5)
WBC: 3.6 10*3/uL (ref 3.6–11.0)

## 2016-07-25 LAB — GLUCOSE, CAPILLARY
GLUCOSE-CAPILLARY: 259 mg/dL — AB (ref 65–99)
Glucose-Capillary: 153 mg/dL — ABNORMAL HIGH (ref 65–99)

## 2016-07-25 LAB — AFP TUMOR MARKER: AFP, SERUM, TUMOR MARKER: 1.9 ng/mL (ref 0.0–8.3)

## 2016-07-25 MED ORDER — LEVOFLOXACIN 500 MG PO TABS: 500.0000 mg | ORAL_TABLET | Freq: Every day | ORAL | 0 refills | Status: DC

## 2016-07-25 MED ORDER — SUCRALFATE 1 GM/10ML PO SUSP: 1.0000 g | Freq: Four times a day (QID) | ORAL | 0 refills | Status: DC | PRN

## 2016-07-25 MED ORDER — PANTOPRAZOLE SODIUM 40 MG PO TBEC
40.0000 mg | DELAYED_RELEASE_TABLET | Freq: Every day | ORAL | Status: DC
  Administered 2016-07-25: 40 mg via ORAL
  Filled 2016-07-25: qty 1

## 2016-07-25 NOTE — Progress Notes (Signed)
Vanessa Bellows MD, MRCP(U.K) [address]  [place]  [place], [place] [address]  Main: [place] Vanessa Oneill is being followed for GI bleed secondary to esophageal varices  Day 2 of follow up   Subjective: No further hematemesis or melena. Feels well.    Objective: Vital signs in last 24 hours: Vitals:   07/24/16 2004 07/25/16 0109 07/25/16 0421 07/25/16 0500  BP: 116/62  131/75   Pulse: (!) 55 (!) 57 (!) 56   Resp: 18     Temp: 98.1 F (36.7 C)  98.2 F (36.8 C)   TempSrc: Oral  Oral   SpO2: 94% 94% 95%   Weight:    210 lb 1.6 oz (95.3 kg)  Height:       Weight change: 2 lb 3.2 oz (0.998 kg)  Intake/Output Summary (Last 24 hours) at 07/25/16 1114 Last data filed at 07/25/16 0745  Gross per 24 hour  Intake             2323 ml  Output              400 ml  Net             1923 ml     Exam: Heart:: Regular rate and rhythm, S1S2 present or without murmur or extra heart sounds Lungs: normal, clear to auscultation and clear to auscultation and percussion Abdomen: soft, nontender, normal bowel sounds   Lab Results: CBC Latest Ref Rng & Units 07/25/2016 07/24/2016 07/23/2016  WBC 3.6 - 11.0 K/uL 3.6 4.8 -  Hemoglobin 12.0 - 16.0 g/dL 9.0(L) 7.7(L) 7.4(L)  Hematocrit 35.0 - 47.0 % 26.9(L) 22.6(L) 22.0(L)  Platelets 150 - 440 K/uL 78(L) 88(L) -     Micro Results: No results found for this or any previous visit (from the past 240 hour(s)). Studies/Results: US Abdomen Limited Ruq  Result Date: 07/23/2016 CLINICAL DATA:  Cirrhosis EXAM: ULTRASOUND ABDOMEN LIMITED RIGHT UPPER QUADRANT COMPARISON:  CT abdomen/ pelvis dated 11/28/2013 FINDINGS: Gallbladder: Surgically absent. Common bile duct: Diameter: Dilated, measuring 12 mm. Liver: Nodular hepatic contour, reflecting cirrhosis. No focal hepatic lesion is seen. Patent color Doppler flow in the portal vein. Spleen is enlarged, measuring 15.5 x 5.8 x 16.6 cm (calculated splenic volume 785 mL). Trace  perihepatic ascites. IMPRESSION: Cirrhosis. Splenomegaly, as described above. Patent portal vein. Trace perihepatic ascites. Status post cholecystectomy. Electronically Signed   By: Julian Hy M.D.   On: 07/23/2016 17:37   Medications: I have reviewed the patient's current medications. Scheduled Meds: . buPROPion  150 mg Oral BID  . escitalopram  10 mg Oral Daily  . gabapentin  100 mg Oral BID  . insulin aspart  0-15 Units Subcutaneous TID WC  . insulin detemir  10 Units Subcutaneous BID BM & HS  . nadolol  80 mg Oral Daily  . octreotide  50 mcg Intravenous Once  . rOPINIRole  1 mg Oral QHS   Continuous Infusions: . sodium chloride 75 mL/hr at 07/25/16 5573  . cefTRIAXone (ROCEPHIN)  IV Stopped (07/25/16 1055)  . octreotide  (SANDOSTATIN)    IV infusion 50 mcg/hr (07/25/16 0205)   PRN Meds:.ALPRAZolam, cyclobenzaprine, HYDROcodone-acetaminophen, ondansetron **OR** ondansetron (ZOFRAN) IV, senna-docusate, sucralfate, SUMAtriptan, traZODone   Assessment: Active Problems:   GIB (gastrointestinal bleeding)  Vanessa Oneill 66 y.o. female admitted with coffee ground emesis, S/p EGD and banding of large eosphageal varices with stigmata of recent bleed, clinically no further bleeding . Ardine Eng A cirrhosis likely  secondary to NAFLD. H/o prior esophageal bleeds   Plan:  1. Octreotide for 72 hours in total  2. Antibiotics for 5 days in total  3. Repeat EGD with banding in 2-3 weeks as outpatient  4. Will need outpatient GI follow up for chronic liver disease 5. PPI at discharge 6. At discharge will need to ensure is on nadolol  7. Soft diet tomorrow  8. May need to consider TIPS due to reucurrent variceal bleeds and referral to transplant center - will discuss as an outpatient   LOS: 3 days   Vanessa Oneill 07/25/2016, 11:14 AM

## 2016-07-25 NOTE — Discharge Instructions (Signed)
Esophageal Varices The esophagus is the passage that connects the throat to the stomach. Esophageal varices are blood vessels in the esophagus that have become enlarged. They develop when extra blood is forced to flow through them because the blood's normal pathway is blocked. Without treatment these blood vessels eventually break and bleed (hemorrhage). A hemorrhage is life-threatening. What are the causes? This condition may be caused by:  Scarring of the liver (cirrhosis) due to alcoholism. This is the most common cause.  Liver disease.  Severe heart failure.  A blood clot in the portal vein.  Sarcoidosis. This is an inflammatory disease that can affect the liver.  Schistosomiasis. This is a parasitic infection that can cause liver damage.  What are the signs or symptoms? Usually there are no symptoms unless the esophageal varices bleed. Symptoms of bleeding esophageal varices include:  Vomiting material that is bright red or that is black and looks like coffee grounds.  Coughing up blood.  Black, tarry stools.  Dizziness or lightheadedness.  Low blood pressure.  Loss of consciousness.  How is this diagnosed? This condition is diagnosed with tests, such as:  Endoscopy. During this test a thin, lighted tube is inserted through the mouth and into the esophagus.  Blood tests. These may be done to check liver function, blood counts, and the body's ability to form blood clots.  How is this treated? This condition may be treated with:  Medicines that reduce pressure in the esophageal varices and reduce the risk of bleeding.  Procedures to reduce pressure in the esophageal varices and reduce the risk of bleeding or stop bleeding. These include: ? Variceal ligation. In this procedure, a rubber band is placed around the esophageal varices to keep them from bleeding. ? Injection therapy. This treatment involves an injection that causes the esophageal varices to shrink and close  (sclerotherapy). Medicines that tighten blood vessels or alter blood flow may also be used. ? Balloon tamponade. In this procedure, a tube is put into the esophagus and a balloon is passed through it and inflated. ? Transjugular intrahepatic portosystemic shunt (TIPS) placement. In this procedure, a small tube is placed within the liver veins. This decreases blood flow and pressure to the esophageal varices.  A liver transplant. This may be done if other treatments do not work.  Follow these instructions at home:  Take medicines only as directed by your health care provider.  Follow your health care provider's instructions about rest and physical activity. Get help right away if:  You have any symptoms of this condition after treatment.  You are unable to eat or drink.  You have chest pain. This information is not intended to replace advice given to you by your health care provider. Make sure you discuss any questions you have with your health care provider. Document Released: 03/11/2003 Document Revised: 05/27/2015 Document Reviewed: 12/15/2013 Elsevier Interactive Patient Education  [employer].

## 2016-07-25 NOTE — Progress Notes (Signed)
States that she feels like the Carafate slurry helped.  Having trouble sleeping. Barbaraann Faster, RN 3:52 AM 07/25/2016

## 2016-07-25 NOTE — Progress Notes (Signed)
IV was removed. Discharge instructions, follow-up appointments, and prescriptions were provided to the pt and husband at bedside. The pt was taken downstairs via wheelchair by NT.

## 2016-07-25 NOTE — Progress Notes (Signed)
C/O not feeling good, discomfort in upper esophagus; Carafate slurry given; O2 sat WNLs after c/o SOB, repositioned, HOB elevated. Barbaraann Faster, RN 2:08 AM 07/25/2016

## 2016-07-25 NOTE — Progress Notes (Signed)
[place] rounding unit visited with pt. Pt's family was at beside. [place] informed pt and family that [facility] services were available in the unit. As the [facility] was talked to pt and family, the room services arrived, [facility] promised to have a followed visit with pt tomorrow and exited room. [place] provided a ministry of presence and spiritual support for pt and family.   07/25/16 1600  Clinical Encounter Type  Visited With Patient and family together  Visit Type Initial;Spiritual support  Referral From [place];Other (Comment)

## 2016-07-25 NOTE — Care Management Important Message (Signed)
Important Message  Patient Details  Name: Vanessa Oneill MRN: [id number] Date of Birth: [birth year]   Medicare Important Message Given:  Yes    Carles Collet, RN 07/25/2016, 3:06 PM

## 2016-07-25 NOTE — Progress Notes (Signed)
Pharmacy Antibiotic Note  Vanessa Oneill is a 66 y.o. female with a h/o nonalcoholic cirrhosis admitted on 07/22/2016 with GIB.  Pharmacy has been consulted for ceftriaxone dosing for SBP prophylaxis.  Plan: Continue ceftriaxone 1 g iv daily.   Height: 5\' 7"  (170.2 cm) Weight: 210 lb 1.6 oz (95.3 kg) IBW/kg (Calculated) : 61.6  Temp (24hrs), Avg:98.3 F (36.8 C), Min:98.1 F (36.7 C), Max:98.5 F (36.9 C)   Recent Labs Lab 07/22/16 0727 07/23/16 0337 07/24/16 0347  WBC 8.6 6.1 4.8  CREATININE 0.95 0.79  --     Estimated Creatinine Clearance: 83.1 mL/min (by C-G formula based on SCr of 0.79 mg/dL).    Allergies  Allergen Reactions  . Tylenol [Acetaminophen] Other (See Comments)    "tylenol 3" chest pains  . Amoxicillin Itching and Rash    Antimicrobials this admission: ceftriaxone 7/21 >>   Dose adjustments this admission:  Microbiology results: none  Thank you for allowing pharmacy to be a part of this patient's care.  Rocky Morel 07/25/2016 10:37 AM

## 2016-07-26 ENCOUNTER — Telehealth: Payer: Self-pay

## 2016-07-26 NOTE — Telephone Encounter (Signed)
Pt has been advised to continue with the carafate and ppi.  Also advised per Dr. Vicente Males for her to take childrens liquid tylenol and take in liquids.

## 2016-07-26 NOTE — Telephone Encounter (Signed)
She had 6 esophageal bands placed a few days back for a severe GI bleed. It is normal to experience some spasms for a while when the bands are in placed. Would suggest to continue with PPI, carafate, can take 1-2 tylenol tablets a day . She should undergo repeat EGD in 2-3 weeks from last for further banding

## 2016-07-26 NOTE — Telephone Encounter (Signed)
Patient called office, stated that she "feels like something is stuck in her throat" she said she is spitting up bitter stuff and just "feels like spasm in esophagus".  She has never experienced this after having an EGD.  Please advise.

## 2016-07-27 NOTE — Discharge Summary (Signed)
[place] at [place] NAME: Vanessa Oneill    MR#:  [id number]  DATE OF BIRTH:  1950-03-13  DATE OF ADMISSION:  07/22/2016   ADMITTING PHYSICIAN: Bettey Costa, MD  DATE OF DISCHARGE: 07/25/2016  5:37 PM  PRIMARY CARE PHYSICIAN: Adin Hector, MD   ADMISSION DIAGNOSIS:  Demand ischemia ([place]) [I24.8] Gastrointestinal hemorrhage, unspecified gastrointestinal hemorrhage type [K92.2] DISCHARGE DIAGNOSIS:  Active Problems:   GIB (gastrointestinal bleeding)  SECONDARY DIAGNOSIS:   Past Medical History:  Diagnosis Date  . COPD (chronic obstructive pulmonary disease) ([place])   . Diabetes mellitus without complication ([place])   . Hypertension   . 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[place])   . Personal history of tobacco use, presenting hazards to health 11/12/2014   HOSPITAL COURSE:  66 year old female with past medical history of nonalcoholic steatohepatitis with cirrhosis, hypertension, diabetes, COPD admitted due to coffee-ground emesis and melanotic stools.  1. Upper GI bleed-this the cause of patient's coffee-ground emesis and melanotic stools. Patient has a history of liver cirrhosis with esophageal varices.  -Patient transfused 3 units of packed red blood cells and hemoglobin remained stable.  - was treated with octreotide for 72 hrs, Protonix drip. and Nadolol for prophylaxis, also empiric ceftriaxone. - Underwent EGD which showed Normal Duodenum, Normal stomach, Recently bleeding grade II esophageal varices which were completely eradicated. 6 of them were Banded. - She is counseled that It is normal to experience some bleeding/likely dark maroon/black and abd spasms for a while when the bands are in placed.  - Being discharged on PPI, carafate, Levaquin (for prophylaxis - unfortunately her insurance wouldn't cover cipro) and nadolol - GI has recommended outpt f/up with them and plan repeat EGD in 2-3 weeks from last for further banding if need - she  tolerated diet and remained hemodynamically stable for D/C  2. Acute blood loss anemia-secondary to the GI bleed. -Patient has been transfused TOTAL 3 units packed red blood cells this admission  3. Elevated troponin-in the setting of supply demand ischemia from GI bleeding. Seen by cardiology and no plans for acute intervention.  DISCHARGE CONDITIONS:  stable CONSULTS OBTAINED:  Treatment Team:  Isaias Cowman, MD [place], MD DRUG ALLERGIES:   Allergies  Allergen Reactions  . Tylenol [Acetaminophen] Other (See Comments)    "tylenol 3" chest pains  . Amoxicillin Itching and Rash   DISCHARGE MEDICATIONS:   Allergies as of 07/25/2016      Reactions   Tylenol [acetaminophen] Other (See Comments)   "tylenol 3" chest pains   Amoxicillin Itching, Rash      Medication List    TAKE these medications   ALPRAZolam 0.25 MG tablet Commonly known as:  XANAX Take 0.25 mg by mouth at bedtime as needed for sleep.   aspirin EC 81 MG tablet Take 81 mg by mouth daily.   buPROPion 150 MG 24 hr tablet Commonly known as:  WELLBUTRIN XL Take 150 mg by mouth 2 (two) times daily.   cyclobenzaprine 10 MG tablet Commonly known as:  FLEXERIL Take 10 mg by mouth 3 (three) times daily as needed for muscle spasms.   escitalopram 10 MG tablet Commonly known as:  LEXAPRO Take 10 mg by mouth daily.   gabapentin 100 MG capsule Commonly known as:  NEURONTIN Take 100 mg by mouth 2 (two) times daily.   glimepiride 4 MG tablet Commonly known as:  AMARYL Take 4 mg by mouth daily with breakfast.   hydrochlorothiazide 25 MG tablet  Commonly known as:  HYDRODIURIL Take 25 mg by mouth daily.   HYDROcodone-acetaminophen 5-325 MG tablet Commonly known as:  NORCO/VICODIN Take 1 tablet by mouth every 6 (six) hours as needed for moderate pain.   insulin NPH Human 100 UNIT/ML injection Commonly known as:  HUMULIN N,NOVOLIN N Inject 20-30 Units into the skin 3 (three) times daily -  between meals and at bedtime. Use 30 units in the morning, 20 units during the day and 30 units at night.   insulin regular 100 units/mL injection Commonly known as:  NOVOLIN R,HUMULIN R Inject 20 Units into the skin 2 (two) times daily before lunch and supper.   ketorolac 10 MG tablet Commonly known as:  TORADOL Take 10 mg by mouth daily as needed for headache.   levofloxacin 500 MG tablet Commonly known as:  LEVAQUIN Take 1 tablet (500 mg total) by mouth daily.   losartan 100 MG tablet Commonly known as:  COZAAR Take 100 mg by mouth daily.   metFORMIN 500 MG 24 hr tablet Commonly known as:  GLUCOPHAGE-XR Take 1,000 mg by mouth daily.   nadolol 80 MG tablet Commonly known as:  CORGARD Take 80 mg by mouth daily.   pantoprazole 40 MG tablet Commonly known as:  PROTONIX Take 40 mg by mouth 2 (two) times daily.   rOPINIRole 1 MG tablet Commonly known as:  REQUIP Take 1 mg by mouth at bedtime.   sucralfate 1 GM/10ML suspension Commonly known as:  CARAFATE Take 10 mLs (1 g total) by mouth 4 (four) times daily as needed (chest discomfort).   SUMAtriptan 50 MG tablet Commonly known as:  IMITREX Take 1 tablet (50 mg total) by mouth every 2 (two) hours as needed for migraine. May repeat in 2 hours if headache persists or recurs.   traZODone 50 MG tablet Commonly known as:  DESYREL Take 50 mg by mouth at bedtime as needed for sleep.        DISCHARGE INSTRUCTIONS:   DIET:  Regular diet - start wifh soft diet and advance as tolerated if no further bleed/abd pain DISCHARGE CONDITION:  Good ACTIVITY:  Activity as tolerated OXYGEN:  Home Oxygen: No.  Oxygen Delivery: room air DISCHARGE LOCATION:  home   If you experience worsening of your admission symptoms, develop shortness of breath, life threatening emergency, suicidal or homicidal thoughts you must seek medical attention immediately by calling 911 or calling your MD immediately  if symptoms less severe.  You  Must read complete instructions/literature along with all the possible adverse reactions/side effects for all the Medicines you take and that have been prescribed to you. Take any new Medicines after you have completely understood and accpet all the possible adverse reactions/side effects.   Please note  You were cared for by a hospitalist during your hospital stay. If you have any questions about your discharge medications or the care you received while you were in the hospital after you are discharged, you can call the unit and asked to speak with the hospitalist on call if the hospitalist that took care of you is not available. Once you are discharged, your primary care physician will handle any further medical issues. Please note that NO REFILLS for any discharge medications will be authorized once you are discharged, as it is imperative that you return to your primary care physician (or establish a relationship with a primary care physician if you do not have one) for your aftercare needs so that they can reassess your need  for medications and monitor your lab values.    On the day of Discharge:  VITAL SIGNS:  Blood pressure (!) 117/52, pulse (!) 52, temperature 97.8 F (36.6 C), temperature source Oral, resp. rate 13, height 5\' 7"  (1.702 m), weight 95.3 kg (210 lb 1.6 oz), SpO2 100 %. PHYSICAL EXAMINATION:  GENERAL:  66 y.o.-year-old patient lying in the bed with no acute distress.  EYES: Pupils equal, round, reactive to light and accommodation. No scleral icterus. Extraocular muscles intact.  HEENT: Head atraumatic, normocephalic. Oropharynx and nasopharynx clear.  NECK:  Supple, no jugular venous distention. No thyroid enlargement, no tenderness.  LUNGS: Normal breath sounds bilaterally, no wheezing, rales,rhonchi or crepitation. No use of accessory muscles of respiration.  CARDIOVASCULAR: S1, S2 normal. No murmurs, rubs, or gallops.  ABDOMEN: Soft, non-tender, non-distended. Bowel sounds  present. No organomegaly or mass.  EXTREMITIES: No pedal edema, cyanosis, or clubbing.  NEUROLOGIC: Cranial nerves II through XII are intact. Muscle strength 5/5 in all extremities. Sensation intact. Gait not checked.  PSYCHIATRIC: The patient is alert and oriented x 3.  SKIN: No obvious rash, lesion, or ulcer.  DATA REVIEW:   CBC  Recent Labs Lab 07/25/16 1037  WBC 3.6  HGB 9.0*  HCT 26.9*  PLT 78*    Chemistries   Recent Labs Lab 07/22/16 0727 07/23/16 0337  NA 133* 139  K 4.7 3.9  CL 101 108  CO2 22 25  GLUCOSE 478* 182*  BUN 29* 21*  CREATININE 0.95 0.79  CALCIUM 9.1 8.2*  AST 33  --   ALT 27  --   ALKPHOS 89  --   BILITOT 1.3*  --      Follow-up Information    Adin Hector, MD. Go on 08/04/2016.   Specialty:  Internal Medicine Why:  Go at 11:00am. Contact information: [place] [address] [phone]        Jonathon Bellows, MD. Daphane Shepherd on 08/10/2016.   Specialty:  Surgery Why:  Go at 2:00pm. Contact information: [place] [address] [phone]           Management plans discussed with the patient, family and they are in agreement.  CODE STATUS: Prior   TOTAL TIME TAKING CARE OF THIS PATIENT: 45 minutes.    Max Sane M.D on 07/27/2016 at 1:04 PM  Between 7am to 6pm - Pager - [phone]  After 6pm go to [email or web address] - password [email or web address] [facility]  [facility]  Office  [phone]  CC: Primary care physician; Adin Hector, MD   Note: This dictation was prepared with Dragon dictation along with smaller phrase technology. Any transcriptional errors that result from this process are unintentional.

## 2016-07-28 ENCOUNTER — Telehealth: Payer: Self-pay | Admitting: Gastroenterology

## 2016-07-28 LAB — HEMOGLOBIN A1C
Hgb A1c MFr Bld: 8.2 % — ABNORMAL HIGH (ref 4.8–5.6)
MEAN PLASMA GLUCOSE: 189 mg/dL

## 2016-07-28 LAB — HIV ANTIBODY (ROUTINE TESTING W REFLEX): HIV SCREEN 4TH GENERATION: NONREACTIVE

## 2016-07-28 NOTE — Telephone Encounter (Signed)
Patient left a voice message that she needs for you to call her back. After her EGD she still feels something is lodged in her throat.

## 2016-08-10 ENCOUNTER — Ambulatory Visit: Payer: Medicare Other | Admitting: Gastroenterology

## 2016-10-17 ENCOUNTER — Inpatient Hospital Stay
Admission: EM | Admit: 2016-10-17 | Discharge: 2016-10-24 | DRG: 871 | Disposition: A | Discharge status: Home Health | Payer: Medicare Other | Attending: Internal Medicine | Admitting: Internal Medicine

## 2016-10-17 ENCOUNTER — Emergency Department: Payer: Medicare Other

## 2016-10-17 ENCOUNTER — Other Ambulatory Visit: Payer: Self-pay

## 2016-10-17 ENCOUNTER — Encounter: Payer: Self-pay | Admitting: Emergency Medicine

## 2016-10-17 DIAGNOSIS — E669 Obesity, unspecified: Secondary | ICD-10-CM | POA: Diagnosis present

## 2016-10-17 DIAGNOSIS — N390 Urinary tract infection, site not specified: Secondary | ICD-10-CM | POA: Diagnosis present

## 2016-10-17 DIAGNOSIS — Z7982 Long term (current) use of aspirin: Secondary | ICD-10-CM

## 2016-10-17 DIAGNOSIS — D6959 Other secondary thrombocytopenia: Secondary | ICD-10-CM | POA: Diagnosis present

## 2016-10-17 DIAGNOSIS — Z801 Family history of malignant neoplasm of trachea, bronchus and lung: Secondary | ICD-10-CM

## 2016-10-17 DIAGNOSIS — F17210 Nicotine dependence, cigarettes, uncomplicated: Secondary | ICD-10-CM | POA: Diagnosis present

## 2016-10-17 DIAGNOSIS — E11649 Type 2 diabetes mellitus with hypoglycemia without coma: Secondary | ICD-10-CM | POA: Diagnosis not present

## 2016-10-17 DIAGNOSIS — J9690 Respiratory failure, unspecified, unspecified whether with hypoxia or hypercapnia: Secondary | ICD-10-CM

## 2016-10-17 DIAGNOSIS — K7460 Unspecified cirrhosis of liver: Secondary | ICD-10-CM | POA: Diagnosis present

## 2016-10-17 DIAGNOSIS — E785 Hyperlipidemia, unspecified: Secondary | ICD-10-CM | POA: Diagnosis present

## 2016-10-17 DIAGNOSIS — A4151 Sepsis due to Escherichia coli [E. coli]: Principal | ICD-10-CM | POA: Diagnosis present

## 2016-10-17 DIAGNOSIS — Z6832 Body mass index (BMI) 32.0-32.9, adult: Secondary | ICD-10-CM

## 2016-10-17 DIAGNOSIS — D709 Neutropenia, unspecified: Secondary | ICD-10-CM | POA: Diagnosis present

## 2016-10-17 DIAGNOSIS — F418 Other specified anxiety disorders: Secondary | ICD-10-CM | POA: Diagnosis present

## 2016-10-17 DIAGNOSIS — D638 Anemia in other chronic diseases classified elsewhere: Secondary | ICD-10-CM | POA: Diagnosis present

## 2016-10-17 DIAGNOSIS — E1165 Type 2 diabetes mellitus with hyperglycemia: Secondary | ICD-10-CM | POA: Diagnosis present

## 2016-10-17 DIAGNOSIS — E8770 Fluid overload, unspecified: Secondary | ICD-10-CM | POA: Diagnosis present

## 2016-10-17 DIAGNOSIS — G8929 Other chronic pain: Secondary | ICD-10-CM | POA: Diagnosis present

## 2016-10-17 DIAGNOSIS — R6521 Severe sepsis with septic shock: Secondary | ICD-10-CM | POA: Diagnosis present

## 2016-10-17 DIAGNOSIS — G9341 Metabolic encephalopathy: Secondary | ICD-10-CM | POA: Diagnosis present

## 2016-10-17 DIAGNOSIS — N12 Tubulo-interstitial nephritis, not specified as acute or chronic: Secondary | ICD-10-CM

## 2016-10-17 DIAGNOSIS — M549 Dorsalgia, unspecified: Secondary | ICD-10-CM | POA: Diagnosis present

## 2016-10-17 DIAGNOSIS — N179 Acute kidney failure, unspecified: Secondary | ICD-10-CM | POA: Diagnosis present

## 2016-10-17 DIAGNOSIS — Z794 Long term (current) use of insulin: Secondary | ICD-10-CM

## 2016-10-17 DIAGNOSIS — Z90710 Acquired absence of both cervix and uterus: Secondary | ICD-10-CM

## 2016-10-17 DIAGNOSIS — J984 Other disorders of lung: Secondary | ICD-10-CM | POA: Diagnosis not present

## 2016-10-17 DIAGNOSIS — Z886 Allergy status to analgesic agent status: Secondary | ICD-10-CM

## 2016-10-17 DIAGNOSIS — F05 Delirium due to known physiological condition: Secondary | ICD-10-CM | POA: Diagnosis not present

## 2016-10-17 DIAGNOSIS — I10 Essential (primary) hypertension: Secondary | ICD-10-CM | POA: Diagnosis present

## 2016-10-17 DIAGNOSIS — E876 Hypokalemia: Secondary | ICD-10-CM | POA: Diagnosis not present

## 2016-10-17 DIAGNOSIS — J9601 Acute respiratory failure with hypoxia: Secondary | ICD-10-CM | POA: Diagnosis present

## 2016-10-17 DIAGNOSIS — R0603 Acute respiratory distress: Secondary | ICD-10-CM

## 2016-10-17 DIAGNOSIS — R410 Disorientation, unspecified: Secondary | ICD-10-CM | POA: Diagnosis not present

## 2016-10-17 DIAGNOSIS — R7881 Bacteremia: Secondary | ICD-10-CM

## 2016-10-17 DIAGNOSIS — E871 Hypo-osmolality and hyponatremia: Secondary | ICD-10-CM | POA: Diagnosis present

## 2016-10-17 DIAGNOSIS — A419 Sepsis, unspecified organism: Secondary | ICD-10-CM | POA: Diagnosis present

## 2016-10-17 DIAGNOSIS — J9600 Acute respiratory failure, unspecified whether with hypoxia or hypercapnia: Secondary | ICD-10-CM

## 2016-10-17 DIAGNOSIS — J441 Chronic obstructive pulmonary disease with (acute) exacerbation: Secondary | ICD-10-CM | POA: Diagnosis present

## 2016-10-17 DIAGNOSIS — Z1612 Extended spectrum beta lactamase (ESBL) resistance: Secondary | ICD-10-CM | POA: Diagnosis present

## 2016-10-17 DIAGNOSIS — G9340 Encephalopathy, unspecified: Secondary | ICD-10-CM | POA: Diagnosis not present

## 2016-10-17 DIAGNOSIS — R0902 Hypoxemia: Secondary | ICD-10-CM

## 2016-10-17 DIAGNOSIS — Z880 Allergy status to penicillin: Secondary | ICD-10-CM

## 2016-10-17 DIAGNOSIS — Z9049 Acquired absence of other specified parts of digestive tract: Secondary | ICD-10-CM

## 2016-10-17 DIAGNOSIS — R6520 Severe sepsis without septic shock: Secondary | ICD-10-CM | POA: Diagnosis not present

## 2016-10-17 LAB — CBC WITH DIFFERENTIAL/PLATELET
BASOS ABS: 0 10*3/uL (ref 0–0.1)
BASOS PCT: 1 %
EOS ABS: 0 10*3/uL (ref 0–0.7)
Eosinophils Relative: 1 %
HCT: 30.6 % — ABNORMAL LOW (ref 35.0–47.0)
HEMOGLOBIN: 9.5 g/dL — AB (ref 12.0–16.0)
Lymphocytes Relative: 26 %
Lymphs Abs: 0.3 10*3/uL — ABNORMAL LOW (ref 1.0–3.6)
MCH: 22.5 pg — ABNORMAL LOW (ref 26.0–34.0)
MCHC: 31.2 g/dL — ABNORMAL LOW (ref 32.0–36.0)
MCV: 72.1 fL — ABNORMAL LOW (ref 80.0–100.0)
Monocytes Absolute: 0 10*3/uL — ABNORMAL LOW (ref 0.2–0.9)
Monocytes Relative: 2 %
NEUTROS PCT: 70 %
Neutro Abs: 0.9 10*3/uL — ABNORMAL LOW (ref 1.4–6.5)
PLATELETS: 51 10*3/uL — AB (ref 150–440)
RBC: 4.24 MIL/uL (ref 3.80–5.20)
RDW: 19.9 % — ABNORMAL HIGH (ref 11.5–14.5)
WBC: 1.3 10*3/uL — AB (ref 3.6–11.0)

## 2016-10-17 LAB — BLOOD GAS, VENOUS
Acid-base deficit: 9 mmol/L — ABNORMAL HIGH (ref 0.0–2.0)
Bicarbonate: 14.8 mmol/L — ABNORMAL LOW (ref 20.0–28.0)
O2 Saturation: 77.5 %
PCO2 VEN: 25 mmHg — AB (ref 44.0–60.0)
PO2 VEN: 43 mmHg (ref 32.0–45.0)
Patient temperature: 37
pH, Ven: 7.38 (ref 7.250–7.430)

## 2016-10-17 LAB — COMPREHENSIVE METABOLIC PANEL
ALK PHOS: 161 U/L — AB (ref 38–126)
ALT: 30 U/L (ref 14–54)
AST: 55 U/L — ABNORMAL HIGH (ref 15–41)
Albumin: 3.6 g/dL (ref 3.5–5.0)
Anion gap: 17 — ABNORMAL HIGH (ref 5–15)
BILIRUBIN TOTAL: 2.2 mg/dL — AB (ref 0.3–1.2)
BUN: 14 mg/dL (ref 6–20)
CALCIUM: 8.8 mg/dL — AB (ref 8.9–10.3)
CHLORIDE: 100 mmol/L — AB (ref 101–111)
CO2: 15 mmol/L — ABNORMAL LOW (ref 22–32)
CREATININE: 1.18 mg/dL — AB (ref 0.44–1.00)
GFR, EST AFRICAN AMERICAN: 55 mL/min — AB (ref >60)
GFR, EST NON AFRICAN AMERICAN: 47 mL/min — AB (ref >60)
Glucose, Bld: 463 mg/dL — ABNORMAL HIGH (ref 65–99)
Potassium: 4 mmol/L (ref 3.5–5.1)
Sodium: 132 mmol/L — ABNORMAL LOW (ref 135–145)
TOTAL PROTEIN: 7.6 g/dL (ref 6.5–8.1)

## 2016-10-17 LAB — URINALYSIS, ROUTINE W REFLEX MICROSCOPIC
BACTERIA UA: NONE SEEN
Bilirubin Urine: NEGATIVE
Glucose, UA: >=500 mg/dL — AB
Ketones, ur: NEGATIVE mg/dL
Leukocytes, UA: NEGATIVE
NITRITE: POSITIVE — AB
Protein, ur: 100 mg/dL — AB
SPECIFIC GRAVITY, URINE: 1.018 (ref 1.005–1.030)
pH: 6 (ref 5.0–8.0)

## 2016-10-17 LAB — LACTIC ACID, PLASMA: LACTIC ACID, VENOUS: 7.3 mmol/L — AB (ref 0.5–1.9)

## 2016-10-17 LAB — TROPONIN I: TROPONIN I: 0.05 ng/mL — AB (ref <0.03)

## 2016-10-17 MED ORDER — SODIUM CHLORIDE 0.9 % IV BOLUS (SEPSIS)
1000.0000 mL | Freq: Once | INTRAVENOUS | Status: AC
  Administered 2016-10-17: 1000 mL via INTRAVENOUS

## 2016-10-17 MED ORDER — ACETAMINOPHEN 650 MG RE SUPP
650.0000 mg | Freq: Once | RECTAL | Status: AC
  Administered 2016-10-17: 650 mg via RECTAL

## 2016-10-17 MED ORDER — ONDANSETRON HCL 4 MG/2ML IJ SOLN
4.0000 mg | Freq: Once | INTRAMUSCULAR | Status: AC
  Administered 2016-10-17: 4 mg via INTRAVENOUS

## 2016-10-17 MED ORDER — ONDANSETRON HCL 4 MG/2ML IJ SOLN
INTRAMUSCULAR | Status: AC
  Administered 2016-10-17: 4 mg via INTRAVENOUS
  Filled 2016-10-17: qty 2

## 2016-10-17 MED ORDER — ACETAMINOPHEN 650 MG RE SUPP
RECTAL | Status: AC
  Administered 2016-10-17: 650 mg via RECTAL
  Filled 2016-10-17: qty 1

## 2016-10-17 MED ORDER — VANCOMYCIN HCL IN DEXTROSE 1-5 GM/200ML-% IV SOLN
1000.0000 mg | Freq: Once | INTRAVENOUS | Status: AC
  Administered 2016-10-17: 1000 mg via INTRAVENOUS
  Filled 2016-10-17: qty 200

## 2016-10-17 MED ORDER — PIPERACILLIN-TAZOBACTAM 3.375 G IVPB 30 MIN
3.3750 g | Freq: Once | INTRAVENOUS | Status: AC
  Administered 2016-10-17: 3.375 g via INTRAVENOUS
  Filled 2016-10-17: qty 50

## 2016-10-17 NOTE — ED Notes (Signed)
Patient taken off bipap at this time per Dr. Cherylann Banas verbal order. Patient is still tachypneic but her respiratory rate has decreased greatly.

## 2016-10-17 NOTE — ED Provider Notes (Signed)
[facility] Emergency Department Provider Note ____________________________________________   First MD Initiated Contact with Patient 10/17/16 2110     (approximate)  I have reviewed the triage vital signs and the nursing notes.   HISTORY  Chief Complaint Code Sepsis  History of present illness severely limited by altered mental status.  HPI Vanessa Oneill is a 66 y.o. female with past medical history as below who presents with fever for one day, initially associated with back pain and bodyaches yesterday, but then this evening associated with vomiting and then with shortness of breath and altered mental status in the last 2 hours. Patient denies pain currently but unable to give other history.    Past Medical History:  Diagnosis Date  . COPD (chronic obstructive pulmonary disease) ([place])   . Diabetes mellitus without complication ([place])   . Hypertension   . 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 Personal history of tobacco use, presenting hazards to health 11/12/2014    Patient Active Problem List   Diagnosis Date Noted  . Septic shock ([place]) 10/18/2016  . Sepsis ([place]) 10/17/2016  . GIB (gastrointestinal bleeding) 07/22/2016  . Personal history of tobacco use, presenting hazards to health 11/12/2014    Past Surgical History:  Procedure Laterality Date  . ABDOMINAL HYSTERECTOMY    . BREAST BIOPSY Right    neg  . CHOLECYSTECTOMY    . ESOPHAGOGASTRODUODENOSCOPY (EGD) WITH PROPOFOL N/A 07/24/2016   Procedure: ESOPHAGOGASTRODUODENOSCOPY (EGD) WITH PROPOFOL;  Surgeon: Jonathon Bellows, MD;  Location: [facility] ENDOSCOPY;  Service: Endoscopy;  Laterality: N/A;    Prior to Admission medications   Medication Sig Start Date End Date Taking? Authorizing Provider  ALPRAZolam Duanne Moron) 0.25 MG tablet Take 0.25 mg by mouth at bedtime as needed for sleep. 07/06/16   [provider]  aspirin EC 81 MG tablet Take 81 mg by mouth daily.    [provider]    buPROPion (WELLBUTRIN XL) 150 MG 24 hr tablet Take 150 mg by mouth 2 (two) times daily. 06/17/16   [provider]  cyclobenzaprine (FLEXERIL) 10 MG tablet Take 10 mg by mouth 3 (three) times daily as needed for muscle spasms. 09/20/15   [provider]  escitalopram (LEXAPRO) 10 MG tablet Take 10 mg by mouth daily. 07/08/16   [provider]  gabapentin (NEURONTIN) 100 MG capsule Take 100 mg by mouth 2 (two) times daily.    [provider]  glimepiride (AMARYL) 4 MG tablet Take 4 mg by mouth daily with breakfast.    [provider]  hydrochlorothiazide (HYDRODIURIL) 25 MG tablet Take 25 mg by mouth daily. 05/17/16   [provider]  HYDROcodone-acetaminophen (NORCO/VICODIN) 5-325 MG tablet Take 1 tablet by mouth every 6 (six) hours as needed for moderate pain.    [provider]  insulin NPH Human (HUMULIN N,NOVOLIN N) 100 UNIT/ML injection Inject 20-30 Units into the skin 3 (three) times daily - between meals and at bedtime. Use 30 units in the morning, 20 units during the day and 30 units at night.    [provider]  insulin regular (NOVOLIN R,HUMULIN R) 100 units/mL injection Inject 20 Units into the skin 2 (two) times daily before lunch and supper.     [provider]  ketorolac (TORADOL) 10 MG tablet Take 10 mg by mouth daily as needed for headache. 07/07/16   [provider]  levofloxacin (LEVAQUIN) 500 MG tablet Take 1 tablet (500 mg total) by mouth daily. 07/25/16  Max Sane, MD  losartan (COZAAR) 100 MG tablet Take 100 mg by mouth daily.    [provider]  metFORMIN (GLUCOPHAGE-XR) 500 MG 24 hr tablet Take 1,000 mg by mouth daily. 03/07/16   [provider]  nadolol (CORGARD) 80 MG tablet Take 80 mg by mouth daily.    [provider]  pantoprazole (PROTONIX) 40 MG tablet Take 40 mg by mouth 2 (two) times daily.     [provider]  rOPINIRole (REQUIP) 1 MG tablet Take 1  mg by mouth at bedtime.     [provider]  sucralfate (CARAFATE) 1 GM/10ML suspension Take 10 mLs (1 g total) by mouth 4 (four) times daily as needed (chest discomfort). 07/25/16   Max Sane, MD  SUMAtriptan (IMITREX) 50 MG tablet Take 1 tablet (50 mg total) by mouth every 2 (two) hours as needed for migraine. May repeat in 2 hours if headache persists or recurs. Patient not taking: Reported on 07/22/2016 01/28/15   Heriberto Antigua, MD  traZODone (DESYREL) 50 MG tablet Take 50 mg by mouth at bedtime as needed for sleep. 06/19/16   [provider]    Allergies Tylenol [acetaminophen] and Amoxicillin  Family History  Problem Relation Age of Onset  . Lung cancer Father     Social History Social History  Substance Use Topics  . Smoking status: Current Every Day Smoker    Packs/day: 1.00    Years: 48.00    Types: Cigarettes  . Smokeless tobacco: Never Used  . Alcohol use No    Review of Systems Level V caveat: Unable to obtain ROS due to altered mental status.    ____________________________________________   PHYSICAL EXAM:  VITAL SIGNS: ED Triage Vitals  Enc Vitals Group     BP 10/17/16 2051 (!) 151/69     Pulse Rate 10/17/16 2051 93     Resp 10/17/16 2051 (!) 24     Temp 10/17/16 2051 (!) 100.4 F (38 C)     Temp Source 10/17/16 2051 Oral     SpO2 10/17/16 2051 96 %     Weight 10/17/16 2052 185 lb (83.9 kg)     Height 10/17/16 2052 5\' 8"  (1.727 m)     Head Circumference --      Peak Flow --      Pain Score --      Pain Loc --      Pain Edu? --      Excl. in [place]? --     Constitutional: Alert, confused appearing.  Oriented to person and "hospital" but states it is 2008.  Eyes: Conjunctivae are normal. EOMI.  L pupil minimally reactive.  Head: Atraumatic. Nose: No congestion/rhinnorhea. Mouth/Throat: Mucous membranes are dry.   Neck: Normal range of motion.  Cardiovascular: Normal rate, regular rhythm. Grossly normal heart sounds.  Good  peripheral circulation. Respiratory: Tachypneic, with increased resp effort. Trace rales to R lung base.  Gastrointestinal: Soft with no focal tenderness.  Genitourinary: No CVA tenderness. Musculoskeletal: Motor intact in all extremities.  Following commands appropriately.  No gross focal neurologic deficits are appreciated.  Skin:  Skin is warm and dry. No rash noted. Psychiatric: Mood and affect are normal. Speech and behavior are normal.  ____________________________________________   LABS (all labs ordered are listed, but only abnormal results are displayed)  Labs Reviewed  COMPREHENSIVE METABOLIC PANEL - Abnormal; Notable for the following:       Result Value   Sodium 132 (*)  Chloride 100 (*)    CO2 15 (*)    Glucose, Bld 463 (*)    Creatinine, Ser 1.18 (*)    Calcium 8.8 (*)    AST 55 (*)    Alkaline Phosphatase 161 (*)    Total Bilirubin 2.2 (*)    GFR calc non Af Amer 47 (*)    GFR calc Af Amer 55 (*)    Anion gap 17 (*)    All other components within normal limits  CBC WITH DIFFERENTIAL/PLATELET - Abnormal; Notable for the following:    WBC 1.3 (*)    Hemoglobin 9.5 (*)    HCT 30.6 (*)    MCV 72.1 (*)    MCH 22.5 (*)    MCHC 31.2 (*)    RDW 19.9 (*)    Platelets 51 (*)    Neutro Abs 0.9 (*)    Lymphs Abs 0.3 (*)    Monocytes Absolute 0.0 (*)    All other components within normal limits  URINALYSIS, ROUTINE W REFLEX MICROSCOPIC - Abnormal; Notable for the following:    Color, Urine YELLOW (*)    APPearance HAZY (*)    Glucose, UA >=500 (*)    Hgb urine dipstick SMALL (*)    Protein, ur 100 (*)    Nitrite POSITIVE (*)    Squamous Epithelial / LPF 0-5 (*)    All other components within normal limits  LACTIC ACID, PLASMA - Abnormal; Notable for the following:    Lactic Acid, Venous 7.3 (*)    All other components within normal limits  TROPONIN I - Abnormal; Notable for the following:    Troponin I 0.05 (*)    All other components within normal limits    BLOOD GAS, VENOUS - Abnormal; Notable for the following:    pCO2, Ven 25 (*)    Bicarbonate 14.8 (*)    Acid-base deficit 9.0 (*)    All other components within normal limits  CULTURE, BLOOD (ROUTINE X 2)  CULTURE, BLOOD (ROUTINE X 2)  LACTIC ACID, PLASMA   ____________________________________________  EKG  ED ECG REPORT I, Arta Silence, the attending physician, personally viewed and interpreted this ECG.  Date: 10/17/2016 EKG Time: 2106 Rate: 94 Rhythm: normal sinus rhythm PVC QRS Axis: normal Intervals: normal ST/T Wave abnormalities: minimal ST depression, lateral leads Narrative Interpretation: nonspecific findings, no acute ischemia  ____________________________________________  RADIOLOGY  CXR: borderline cardiomegaly with vascular congestion, no focal infiltrate   ____________________________________________   PROCEDURES  Procedure(s) performed: No    Critical Care performed: Yes  CRITICAL CARE Performed by: Arta Silence   Total critical care time: 45 minutes  Critical care time was exclusive of separately billable procedures and treating other patients.  Critical care was necessary to treat or prevent imminent or life-threatening deterioration.  Critical care was time spent personally by me on the following activities: development of treatment plan with patient and/or surrogate as well as nursing, discussions with consultants, evaluation of patient's response to treatment, examination of patient, obtaining history from patient or surrogate, ordering and performing treatments and interventions, ordering and review of laboratory studies, ordering and review of radiographic studies, pulse oximetry and re-evaluation of patient's condition.  ____________________________________________   INITIAL IMPRESSION / ASSESSMENT AND PLAN / ED COURSE  Pertinent labs & imaging results that were available during my care of the patient were reviewed by me  and considered in my medical decision making (see chart for details).  66 year old female with past medical history as noted  presents with fever for one day, with acute onset of vomiting, respiratory distress, and altered mental status in the last several hours. review of patient's past medical records and epic is noncontributory.  On exam, patient is febrile to 106, tachypneic to the 30s, with other wise normal vital signs. Patient is alert and answering questions but appears confused. Trace rales to R lung base.  L pupil minimally reactive (may be post surgical) but neuro exam otherwise nonfocal.  Presentation c/w sepsis; differential includes pneumonia incl aspiration, UTI, viral syndrome, less likely CNS cause.  Do not susp acute CNS etiology given otherwise nonfocal neuro exam.  Plan: code sepsis, fluids, antipyretic, empiric abx, and admit.  Due to respiratory distress and increased work of breathing, we will place on BiPAP temporarily until pt's resp distress improves - does not require intubation at this time.   Approximately 10 minutes spent discussing care and counseling patient's husband at bedside.     ----------------------------------------- 10:26 PM on 10/17/2016 -----------------------------------------  Workup reveals significantly elevated lactate and low WBC. Patient's VBG is reassuring.  On reassessment, she is more alert and appears more comfortable. We will attempt to wean off of the BiPAP when her respiratory rate improves. Chest x-ray with no focal findings, but UA is grossly positive and this is the most likely source of infection. Antibiotics already given. We will give additional fluids and proceed with admission.  ____________________________________________   FINAL CLINICAL IMPRESSION(S) / ED DIAGNOSES  Final diagnoses:  Sepsis, due to unspecified organism [facility])  Urinary tract infection without hematuria, site unspecified  Respiratory distress      NEW  MEDICATIONS STARTED DURING THIS VISIT:  New Prescriptions   No medications on file     Note:  This document was prepared using Dragon voice recognition software and may include unintentional dictation errors.    Arta Silence, MD 10/18/16 [phone]

## 2016-10-17 NOTE — ED Notes (Signed)
MD notified of un reactive pupil.

## 2016-10-17 NOTE — ED Triage Notes (Signed)
Patient with complaint of chills that started last night. Patient shortness of breath and vomiting and shortness of breath that started this morning. Patient reports vomiting times 6 today. Husband reports that patient has become confused over the last hour.

## 2016-10-17 NOTE — ED Notes (Signed)
RT at bedside to place patient on bipap 

## 2016-10-18 ENCOUNTER — Inpatient Hospital Stay: Payer: Medicare Other

## 2016-10-18 DIAGNOSIS — N12 Tubulo-interstitial nephritis, not specified as acute or chronic: Secondary | ICD-10-CM

## 2016-10-18 DIAGNOSIS — A419 Sepsis, unspecified organism: Secondary | ICD-10-CM

## 2016-10-18 DIAGNOSIS — N390 Urinary tract infection, site not specified: Secondary | ICD-10-CM

## 2016-10-18 DIAGNOSIS — R0603 Acute respiratory distress: Secondary | ICD-10-CM

## 2016-10-18 DIAGNOSIS — R6521 Severe sepsis with septic shock: Secondary | ICD-10-CM

## 2016-10-18 LAB — BLOOD CULTURE ID PANEL (REFLEXED)
ACINETOBACTER BAUMANNII: NOT DETECTED
CANDIDA ALBICANS: NOT DETECTED
CANDIDA KRUSEI: NOT DETECTED
CANDIDA PARAPSILOSIS: NOT DETECTED
Candida glabrata: NOT DETECTED
Candida tropicalis: NOT DETECTED
Carbapenem resistance: NOT DETECTED
ENTEROBACTER CLOACAE COMPLEX: NOT DETECTED
ENTEROBACTERIACEAE SPECIES: DETECTED — AB
ENTEROCOCCUS SPECIES: NOT DETECTED
Escherichia coli: DETECTED — AB
Haemophilus influenzae: NOT DETECTED
KLEBSIELLA OXYTOCA: NOT DETECTED
KLEBSIELLA PNEUMONIAE: NOT DETECTED
LISTERIA MONOCYTOGENES: NOT DETECTED
Neisseria meningitidis: NOT DETECTED
PSEUDOMONAS AERUGINOSA: NOT DETECTED
Proteus species: NOT DETECTED
STAPHYLOCOCCUS AUREUS BCID: NOT DETECTED
STREPTOCOCCUS PNEUMONIAE: NOT DETECTED
STREPTOCOCCUS PYOGENES: NOT DETECTED
STREPTOCOCCUS SPECIES: NOT DETECTED
Serratia marcescens: NOT DETECTED
Staphylococcus species: NOT DETECTED
Streptococcus agalactiae: NOT DETECTED

## 2016-10-18 LAB — CBC
HCT: 27.9 % — ABNORMAL LOW (ref 35.0–47.0)
HEMATOCRIT: 24.9 % — AB (ref 35.0–47.0)
Hemoglobin: 7.7 g/dL — ABNORMAL LOW (ref 12.0–16.0)
Hemoglobin: 8.6 g/dL — ABNORMAL LOW (ref 12.0–16.0)
MCH: 22.5 pg — ABNORMAL LOW (ref 26.0–34.0)
MCH: 22.4 pg — AB (ref 26.0–34.0)
MCHC: 31 g/dL — AB (ref 32.0–36.0)
MCHC: 30.8 g/dL — AB (ref 32.0–36.0)
MCV: 72.5 fL — ABNORMAL LOW (ref 80.0–100.0)
MCV: 72.7 fL — AB (ref 80.0–100.0)
PLATELETS: 40 10*3/uL — AB (ref 150–440)
PLATELETS: 56 10*3/uL — AB (ref 150–440)
RBC: 3.44 MIL/uL — ABNORMAL LOW (ref 3.80–5.20)
RBC: 3.84 MIL/uL (ref 3.80–5.20)
RDW: 19.9 % — AB (ref 11.5–14.5)
RDW: 20.4 % — ABNORMAL HIGH (ref 11.5–14.5)
WBC: 4.5 10*3/uL (ref 3.6–11.0)
WBC: 13.3 10*3/uL — ABNORMAL HIGH (ref 3.6–11.0)

## 2016-10-18 LAB — BASIC METABOLIC PANEL
Anion gap: 10 (ref 5–15)
Anion gap: 7 (ref 5–15)
BUN: 17 mg/dL (ref 6–20)
BUN: 22 mg/dL — AB (ref 6–20)
CALCIUM: 7.5 mg/dL — AB (ref 8.9–10.3)
CHLORIDE: 111 mmol/L (ref 101–111)
CO2: 16 mmol/L — ABNORMAL LOW (ref 22–32)
CO2: 17 mmol/L — AB (ref 22–32)
CREATININE: 1.4 mg/dL — AB (ref 0.44–1.00)
CREATININE: 1.34 mg/dL — AB (ref 0.44–1.00)
Calcium: 7.3 mg/dL — ABNORMAL LOW (ref 8.9–10.3)
Chloride: 109 mmol/L (ref 101–111)
GFR calc Af Amer: 45 mL/min — ABNORMAL LOW (ref >60)
GFR calc Af Amer: 47 mL/min — ABNORMAL LOW (ref >60)
GFR calc non Af Amer: 41 mL/min — ABNORMAL LOW (ref >60)
GFR, EST NON AFRICAN AMERICAN: 38 mL/min — AB (ref >60)
GLUCOSE: 339 mg/dL — AB (ref 65–99)
GLUCOSE: 290 mg/dL — AB (ref 65–99)
POTASSIUM: 2.8 mmol/L — AB (ref 3.5–5.1)
Potassium: 5.5 mmol/L — ABNORMAL HIGH (ref 3.5–5.1)
SODIUM: 135 mmol/L (ref 135–145)
Sodium: 135 mmol/L (ref 135–145)

## 2016-10-18 LAB — GLUCOSE, CAPILLARY
GLUCOSE-CAPILLARY: 211 mg/dL — AB (ref 65–99)
GLUCOSE-CAPILLARY: 284 mg/dL — AB (ref 65–99)
Glucose-Capillary: 333 mg/dL — ABNORMAL HIGH (ref 65–99)
Glucose-Capillary: 196 mg/dL — ABNORMAL HIGH (ref 65–99)
Glucose-Capillary: 182 mg/dL — ABNORMAL HIGH (ref 65–99)

## 2016-10-18 LAB — PROTIME-INR
INR: 1.54
PROTHROMBIN TIME: 18.4 s — AB (ref 11.4–15.2)

## 2016-10-18 LAB — LACTIC ACID, PLASMA: Lactic Acid, Venous: 6.5 mmol/L (ref 0.5–1.9)

## 2016-10-18 LAB — MRSA PCR SCREENING: MRSA by PCR: NEGATIVE

## 2016-10-18 LAB — APTT: aPTT: 36 seconds (ref 24–36)

## 2016-10-18 LAB — PROCALCITONIN: Procalcitonin: 79.38 ng/mL

## 2016-10-18 LAB — TROPONIN I: Troponin I: 0.08 ng/mL (ref <0.03)

## 2016-10-18 MED ORDER — SODIUM CHLORIDE 0.9 % IV SOLN
INTRAVENOUS | Status: DC
  Administered 2016-10-18 (×2): via INTRAVENOUS

## 2016-10-18 MED ORDER — INSULIN DETEMIR 100 UNIT/ML ~~LOC~~ SOLN
30.0000 [IU] | Freq: Every day | SUBCUTANEOUS | Status: DC
  Administered 2016-10-18: 30 [IU] via SUBCUTANEOUS
  Filled 2016-10-18 (×3): qty 0.3

## 2016-10-18 MED ORDER — CYCLOBENZAPRINE HCL 10 MG PO TABS
10.0000 mg | ORAL_TABLET | Freq: Three times a day (TID) | ORAL | Status: DC | PRN
  Filled 2016-10-18: qty 1

## 2016-10-18 MED ORDER — LEVOFLOXACIN IN D5W 750 MG/150ML IV SOLN: 750.0000 mg | INTRAVENOUS | Status: DC

## 2016-10-18 MED ORDER — SUCRALFATE 1 GM/10ML PO SUSP
1.0000 g | Freq: Four times a day (QID) | ORAL | Status: DC | PRN
  Administered 2016-10-23: 1 g via ORAL
  Filled 2016-10-18: qty 10

## 2016-10-18 MED ORDER — LORAZEPAM 2 MG/ML IJ SOLN
1.0000 mg | Freq: Once | INTRAMUSCULAR | Status: AC
  Administered 2016-10-18: 1 mg via INTRAVENOUS
  Filled 2016-10-18: qty 1

## 2016-10-18 MED ORDER — SODIUM CHLORIDE 0.9 % IV SOLN
0.0000 ug/min | INTRAVENOUS | Status: DC
  Administered 2016-10-18: 90 ug/min via INTRAVENOUS
  Administered 2016-10-18: 80 ug/min via INTRAVENOUS
  Administered 2016-10-18: 100 ug/min via INTRAVENOUS
  Administered 2016-10-18 (×2): 50 ug/min via INTRAVENOUS
  Administered 2016-10-18: 10 ug/min via INTRAVENOUS
  Administered 2016-10-18: 40 ug/min via INTRAVENOUS
  Filled 2016-10-18 (×2): qty 10
  Filled 2016-10-18: qty 1
  Filled 2016-10-18: qty 10
  Filled 2016-10-18: qty 1
  Filled 2016-10-18: qty 10
  Filled 2016-10-18: qty 1

## 2016-10-18 MED ORDER — HYDROCODONE-ACETAMINOPHEN 5-325 MG PO TABS
1.0000 | ORAL_TABLET | Freq: Four times a day (QID) | ORAL | Status: DC | PRN
  Administered 2016-10-21 – 2016-10-24 (×10): 1 via ORAL
  Filled 2016-10-18 (×11): qty 1

## 2016-10-18 MED ORDER — PANTOPRAZOLE SODIUM 40 MG PO TBEC
40.0000 mg | DELAYED_RELEASE_TABLET | Freq: Two times a day (BID) | ORAL | Status: DC
  Administered 2016-10-18 – 2016-10-24 (×12): 40 mg via ORAL
  Filled 2016-10-18 (×13): qty 1

## 2016-10-18 MED ORDER — SENNOSIDES-DOCUSATE SODIUM 8.6-50 MG PO TABS: 1.0000 | ORAL_TABLET | Freq: Every evening | ORAL | Status: DC | PRN

## 2016-10-18 MED ORDER — BISACODYL 5 MG PO TBEC: 5.0000 mg | DELAYED_RELEASE_TABLET | Freq: Every day | ORAL | Status: DC | PRN

## 2016-10-18 MED ORDER — GABAPENTIN 100 MG PO CAPS
100.0000 mg | ORAL_CAPSULE | Freq: Two times a day (BID) | ORAL | Status: DC
  Administered 2016-10-18 – 2016-10-20 (×6): 100 mg via ORAL
  Filled 2016-10-18 (×7): qty 1

## 2016-10-18 MED ORDER — IPRATROPIUM-ALBUTEROL 0.5-2.5 (3) MG/3ML IN SOLN
3.0000 mL | Freq: Four times a day (QID) | RESPIRATORY_TRACT | Status: DC
  Administered 2016-10-18 – 2016-10-24 (×23): 3 mL via RESPIRATORY_TRACT
  Filled 2016-10-18 (×24): qty 3

## 2016-10-18 MED ORDER — INSULIN REGULAR HUMAN 100 UNIT/ML IJ SOLN: 20.0000 [IU] | Freq: Two times a day (BID) | INTRAMUSCULAR | Status: DC

## 2016-10-18 MED ORDER — INSULIN DETEMIR 100 UNIT/ML ~~LOC~~ SOLN
30.0000 [IU] | Freq: Every day | SUBCUTANEOUS | Status: DC
  Administered 2016-10-18: 30 [IU] via SUBCUTANEOUS
  Filled 2016-10-18: qty 0.3

## 2016-10-18 MED ORDER — LACTATED RINGERS IV SOLN
INTRAVENOUS | Status: DC
  Administered 2016-10-18 (×2): via INTRAVENOUS

## 2016-10-18 MED ORDER — ALPRAZOLAM 0.25 MG PO TABS
0.2500 mg | ORAL_TABLET | Freq: Four times a day (QID) | ORAL | Status: DC | PRN
  Administered 2016-10-18 – 2016-10-23 (×5): 0.25 mg via ORAL
  Filled 2016-10-18 (×5): qty 1

## 2016-10-18 MED ORDER — INSULIN ASPART 100 UNIT/ML ~~LOC~~ SOLN
0.0000 [IU] | Freq: Three times a day (TID) | SUBCUTANEOUS | Status: DC
  Administered 2016-10-18: 3 [IU] via SUBCUTANEOUS
  Filled 2016-10-18: qty 1

## 2016-10-18 MED ORDER — ESCITALOPRAM OXALATE 10 MG PO TABS
10.0000 mg | ORAL_TABLET | Freq: Every day | ORAL | Status: DC
  Administered 2016-10-19 – 2016-10-20 (×2): 10 mg via ORAL
  Filled 2016-10-18 (×4): qty 1

## 2016-10-18 MED ORDER — INSULIN DETEMIR 100 UNIT/ML ~~LOC~~ SOLN
20.0000 [IU] | Freq: Every day | SUBCUTANEOUS | Status: DC
  Filled 2016-10-18: qty 0.2

## 2016-10-18 MED ORDER — DEXTROSE 5 % IV SOLN
1.0000 g | Freq: Three times a day (TID) | INTRAVENOUS | Status: DC
  Administered 2016-10-18: 1 g via INTRAVENOUS
  Filled 2016-10-18 (×3): qty 1

## 2016-10-18 MED ORDER — SODIUM CHLORIDE 0.9 % IV SOLN
1.0000 g | Freq: Two times a day (BID) | INTRAVENOUS | Status: DC
  Administered 2016-10-18 – 2016-10-19 (×3): 1 g via INTRAVENOUS
  Filled 2016-10-18 (×6): qty 1

## 2016-10-18 MED ORDER — HEPARIN SODIUM (PORCINE) 5000 UNIT/ML IJ SOLN
5000.0000 [IU] | Freq: Three times a day (TID) | INTRAMUSCULAR | Status: DC
  Administered 2016-10-18: 5000 [IU] via SUBCUTANEOUS
  Filled 2016-10-18: qty 1

## 2016-10-18 MED ORDER — INSULIN NPH (HUMAN) (ISOPHANE) 100 UNIT/ML ~~LOC~~ SUSP: 20.0000 [IU] | Freq: Two times a day (BID) | SUBCUTANEOUS | Status: DC

## 2016-10-18 MED ORDER — METHYLPREDNISOLONE SODIUM SUCC 125 MG IJ SOLR
60.0000 mg | INTRAMUSCULAR | Status: DC
  Administered 2016-10-18: 60 mg via INTRAVENOUS
  Filled 2016-10-18: qty 2

## 2016-10-18 MED ORDER — ALBUTEROL SULFATE (2.5 MG/3ML) 0.083% IN NEBU
2.5000 mg | INHALATION_SOLUTION | RESPIRATORY_TRACT | Status: DC | PRN
  Administered 2016-10-19: 2.5 mg via RESPIRATORY_TRACT
  Filled 2016-10-18 (×2): qty 3

## 2016-10-18 MED ORDER — ROPINIROLE HCL 1 MG PO TABS
1.0000 mg | ORAL_TABLET | Freq: Every day | ORAL | Status: DC
  Administered 2016-10-18 – 2016-10-23 (×6): 1 mg via ORAL
  Filled 2016-10-18 (×8): qty 1

## 2016-10-18 MED ORDER — INSULIN ASPART 100 UNIT/ML ~~LOC~~ SOLN
0.0000 [IU] | Freq: Three times a day (TID) | SUBCUTANEOUS | Status: DC
  Administered 2016-10-18: 3 [IU] via SUBCUTANEOUS
  Administered 2016-10-19: 2 [IU] via SUBCUTANEOUS
  Administered 2016-10-19: 3 [IU] via SUBCUTANEOUS
  Administered 2016-10-19: 5 [IU] via SUBCUTANEOUS
  Administered 2016-10-20: 3 [IU] via SUBCUTANEOUS
  Administered 2016-10-21: 5 [IU] via SUBCUTANEOUS
  Administered 2016-10-21: 3 [IU] via SUBCUTANEOUS
  Administered 2016-10-21: 5 [IU] via SUBCUTANEOUS
  Administered 2016-10-22: 2 [IU] via SUBCUTANEOUS
  Administered 2016-10-22 – 2016-10-23 (×3): 3 [IU] via SUBCUTANEOUS
  Administered 2016-10-23 – 2016-10-24 (×2): 5 [IU] via SUBCUTANEOUS
  Administered 2016-10-24: 3 [IU] via SUBCUTANEOUS
  Filled 2016-10-18 (×16): qty 1

## 2016-10-18 MED ORDER — LOSARTAN POTASSIUM 50 MG PO TABS: 100.0000 mg | ORAL_TABLET | Freq: Every day | ORAL | Status: DC

## 2016-10-18 MED ORDER — LORAZEPAM 2 MG/ML IJ SOLN
1.0000 mg | Freq: Four times a day (QID) | INTRAMUSCULAR | Status: DC | PRN
  Administered 2016-10-18: 1 mg via INTRAVENOUS
  Filled 2016-10-18: qty 1

## 2016-10-18 MED ORDER — ONDANSETRON HCL 4 MG/2ML IJ SOLN: 4.0000 mg | Freq: Four times a day (QID) | INTRAMUSCULAR | Status: DC | PRN

## 2016-10-18 MED ORDER — INSULIN ASPART 100 UNIT/ML ~~LOC~~ SOLN
0.0000 [IU] | Freq: Every day | SUBCUTANEOUS | Status: DC
Start: 2016-10-18 — End: 2016-10-24
  Administered 2016-10-18: 3 [IU] via SUBCUTANEOUS
  Administered 2016-10-20: 2 [IU] via SUBCUTANEOUS
  Filled 2016-10-18 (×2): qty 1

## 2016-10-18 MED ORDER — LEVOFLOXACIN IN D5W 750 MG/150ML IV SOLN
750.0000 mg | Freq: Once | INTRAVENOUS | Status: AC
  Administered 2016-10-18: 750 mg via INTRAVENOUS
  Filled 2016-10-18: qty 150

## 2016-10-18 MED ORDER — NADOLOL 80 MG PO TABS
80.0000 mg | ORAL_TABLET | Freq: Every day | ORAL | Status: DC
  Filled 2016-10-18: qty 1

## 2016-10-18 MED ORDER — INSULIN ASPART 100 UNIT/ML ~~LOC~~ SOLN
0.0000 [IU] | Freq: Every day | SUBCUTANEOUS | Status: DC
  Administered 2016-10-18: 4 [IU] via SUBCUTANEOUS
  Filled 2016-10-18: qty 1

## 2016-10-18 MED ORDER — BUPROPION HCL ER (XL) 150 MG PO TB24
150.0000 mg | ORAL_TABLET | Freq: Two times a day (BID) | ORAL | Status: DC
  Administered 2016-10-18 – 2016-10-19 (×3): 150 mg via ORAL
  Filled 2016-10-18 (×6): qty 1

## 2016-10-18 MED ORDER — INSULIN ASPART 100 UNIT/ML ~~LOC~~ SOLN: 20.0000 [IU] | Freq: Two times a day (BID) | SUBCUTANEOUS | Status: DC

## 2016-10-18 MED ORDER — POTASSIUM CHLORIDE CRYS ER 20 MEQ PO TBCR
40.0000 meq | EXTENDED_RELEASE_TABLET | Freq: Two times a day (BID) | ORAL | Status: DC
  Administered 2016-10-18 (×3): 40 meq via ORAL
  Filled 2016-10-18 (×3): qty 2

## 2016-10-18 MED ORDER — ALPRAZOLAM 0.25 MG PO TABS
0.2500 mg | ORAL_TABLET | Freq: Every evening | ORAL | Status: DC | PRN
  Administered 2016-10-18: 0.25 mg via ORAL
  Filled 2016-10-18: qty 1

## 2016-10-18 MED ORDER — BUDESONIDE 0.25 MG/2ML IN SUSP
0.2500 mg | Freq: Two times a day (BID) | RESPIRATORY_TRACT | Status: DC
  Administered 2016-10-18 – 2016-10-20 (×4): 0.25 mg via RESPIRATORY_TRACT
  Filled 2016-10-18 (×4): qty 2

## 2016-10-18 MED ORDER — SODIUM CHLORIDE 0.9 % IV BOLUS (SEPSIS)
1000.0000 mL | Freq: Once | INTRAVENOUS | Status: AC
  Administered 2016-10-18: 1000 mL via INTRAVENOUS

## 2016-10-18 MED ORDER — POTASSIUM CHLORIDE 10 MEQ/100ML IV SOLN
10.0000 meq | INTRAVENOUS | Status: DC
  Administered 2016-10-18 (×2): 10 meq via INTRAVENOUS
  Filled 2016-10-18 (×4): qty 100

## 2016-10-18 MED ORDER — KETOROLAC TROMETHAMINE 10 MG PO TABS
10.0000 mg | ORAL_TABLET | Freq: Every day | ORAL | Status: DC | PRN
  Filled 2016-10-18: qty 1

## 2016-10-18 MED ORDER — ASPIRIN EC 81 MG PO TBEC
81.0000 mg | DELAYED_RELEASE_TABLET | Freq: Every day | ORAL | Status: DC
  Administered 2016-10-18 – 2016-10-24 (×6): 81 mg via ORAL
  Filled 2016-10-18 (×7): qty 1

## 2016-10-18 MED ORDER — TRAZODONE HCL 50 MG PO TABS
50.0000 mg | ORAL_TABLET | Freq: Every evening | ORAL | Status: DC | PRN
  Administered 2016-10-18: 50 mg via ORAL
  Filled 2016-10-18 (×2): qty 1

## 2016-10-18 MED ORDER — DIPHENHYDRAMINE HCL 50 MG/ML IJ SOLN
12.5000 mg | Freq: Four times a day (QID) | INTRAMUSCULAR | Status: DC | PRN
  Administered 2016-10-18: 12.5 mg via INTRAVENOUS
  Filled 2016-10-18: qty 1

## 2016-10-18 MED ORDER — ONDANSETRON HCL 4 MG PO TABS: 4.0000 mg | ORAL_TABLET | Freq: Four times a day (QID) | ORAL | Status: DC | PRN

## 2016-10-18 MED ORDER — PNEUMOCOCCAL VAC POLYVALENT 25 MCG/0.5ML IJ INJ
0.5000 mL | INJECTION | INTRAMUSCULAR | Status: DC
  Filled 2016-10-18: qty 0.5

## 2016-10-18 NOTE — H&P (Addendum)
[place] [facility] [place] NAME: Vanessa Oneill    MR#:  [id number]  DATE OF BIRTH:  1950-09-21  DATE OF ADMISSION:  10/17/2016  PRIMARY CARE PHYSICIAN: Adin Hector, MD   REQUESTING/REFERRING PHYSICIAN: Arta Silence, MD  CHIEF COMPLAINT:   Chief Complaint  Patient presents with  . Code Sepsis   Fever and chills for 2-3 days. HISTORY OF PRESENT ILLNESS:  Vanessa Oneill  is a 66 y.o. female with a known history of hypertension, diabetes, COPD, no alcohol related cirrhosis. The patient presented to the ED with the above chief complaints. She has had fever and chills for 2-3 days, she denies any dysuria or hematuria but has urine frequency. She also complains of nausea and vomiting and shortness breath today. She denies any cough, sputum or chest pain. The patient has fever 106, tachycardia, tachypnea and hypotension, treated with antibiotics and normal saline bolus.  PAST MEDICAL HISTORY:   Past Medical History:  Diagnosis Date  . COPD (chronic obstructive pulmonary disease) ([place])   . Diabetes mellitus without complication ([place])   . Hypertension   . 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 Personal history of tobacco use, presenting hazards to health 11/12/2014    PAST SURGICAL HISTORY:   Past Surgical History:  Procedure Laterality Date  . ABDOMINAL HYSTERECTOMY    . BREAST BIOPSY Right    neg  . CHOLECYSTECTOMY    . ESOPHAGOGASTRODUODENOSCOPY (EGD) WITH PROPOFOL N/A 07/24/2016   Procedure: ESOPHAGOGASTRODUODENOSCOPY (EGD) WITH PROPOFOL;  Surgeon: Jonathon Bellows, MD;  Location: [facility] ENDOSCOPY;  Service: Endoscopy;  Laterality: N/A;    SOCIAL HISTORY:   Social History  Substance Use Topics  . Smoking status: Current Every Day Smoker    Packs/day: 1.00    Years: 48.00    Types: Cigarettes  . Smokeless tobacco: Never Used  . Alcohol use No    FAMILY HISTORY:   Family History  Problem Relation Age of Onset  . Lung cancer  Father     DRUG ALLERGIES:   Allergies  Allergen Reactions  . Tylenol [Acetaminophen] Other (See Comments)    "tylenol 3" chest pains  . Amoxicillin Itching and Rash    REVIEW OF SYSTEMS:   Review of Systems  Constitutional: Positive for chills, fever and malaise/fatigue.  HENT: Negative for sore throat.   Eyes: Negative for blurred vision and double vision.  Respiratory: Negative for cough, hemoptysis, shortness of breath, wheezing and stridor.   Cardiovascular: Negative for chest pain, palpitations, orthopnea and leg swelling.  Gastrointestinal: Negative for abdominal pain, blood in stool, diarrhea, melena, nausea and vomiting.  Genitourinary: Positive for frequency. Negative for dysuria, flank pain and hematuria.  Musculoskeletal: Negative for back pain and joint pain.  Skin: Negative for rash.  Neurological: Positive for weakness. Negative for dizziness, sensory change, focal weakness, seizures, loss of consciousness and headaches.  Endo/Heme/Allergies: Negative for polydipsia.  Psychiatric/Behavioral: Negative for depression. The patient is not nervous/anxious.     MEDICATIONS AT HOME:   Prior to Admission medications   Medication Sig Start Date End Date Taking? Authorizing Provider  ALPRAZolam Duanne Moron) 0.25 MG tablet Take 0.25 mg by mouth at bedtime as needed for sleep. 07/06/16   [provider]  aspirin EC 81 MG tablet Take 81 mg by mouth daily.    [provider]  buPROPion (WELLBUTRIN XL) 150 MG 24 hr tablet Take 150 mg by mouth 2 (two) times daily. 06/17/16   [provider]  cyclobenzaprine (FLEXERIL) 10 MG tablet Take 10 mg by mouth 3 (three) times daily as needed for muscle spasms. 09/20/15   [provider]  escitalopram (LEXAPRO) 10 MG tablet Take 10 mg by mouth daily. 07/08/16   [provider]  gabapentin (NEURONTIN) 100 MG capsule Take 100 mg by mouth 2 (two) times daily.    [provider]  glimepiride (AMARYL)  4 MG tablet Take 4 mg by mouth daily with breakfast.    [provider]  hydrochlorothiazide (HYDRODIURIL) 25 MG tablet Take 25 mg by mouth daily. 05/17/16   [provider]  HYDROcodone-acetaminophen (NORCO/VICODIN) 5-325 MG tablet Take 1 tablet by mouth every 6 (six) hours as needed for moderate pain.    [provider]  insulin NPH Human (HUMULIN N,NOVOLIN N) 100 UNIT/ML injection Inject 20-30 Units into the skin 3 (three) times daily - between meals and at bedtime. Use 30 units in the morning, 20 units during the day and 30 units at night.    [provider]  insulin regular (NOVOLIN R,HUMULIN R) 100 units/mL injection Inject 20 Units into the skin 2 (two) times daily before lunch and supper.     [provider]  ketorolac (TORADOL) 10 MG tablet Take 10 mg by mouth daily as needed for headache. 07/07/16   [provider]  levofloxacin (LEVAQUIN) 500 MG tablet Take 1 tablet (500 mg total) by mouth daily. 07/25/16   Max Sane, MD  losartan (COZAAR) 100 MG tablet Take 100 mg by mouth daily.    [provider]  metFORMIN (GLUCOPHAGE-XR) 500 MG 24 hr tablet Take 1,000 mg by mouth daily. 03/07/16   [provider]  nadolol (CORGARD) 80 MG tablet Take 80 mg by mouth daily.    [provider]  pantoprazole (PROTONIX) 40 MG tablet Take 40 mg by mouth 2 (two) times daily.     [provider]  rOPINIRole (REQUIP) 1 MG tablet Take 1 mg by mouth at bedtime.     [provider]  sucralfate (CARAFATE) 1 GM/10ML suspension Take 10 mLs (1 g total) by mouth 4 (four) times daily as needed (chest discomfort). 07/25/16   Max Sane, MD  SUMAtriptan (IMITREX) 50 MG tablet Take 1 tablet (50 mg total) by mouth every 2 (two) hours as needed for migraine. May repeat in 2 hours if headache persists or recurs. Patient not taking: Reported on 07/22/2016 01/28/15   Heriberto Antigua, MD  traZODone (DESYREL) 50 MG tablet Take 50 mg by  mouth at bedtime as needed for sleep. 06/19/16   [provider]      VITAL SIGNS:  Blood pressure 97/61, pulse 90, temperature (!) 102.3 F (39.1 C), temperature source Rectal, resp. rate (!) 28, height 5\' 8"  (1.727 m), weight 185 lb (83.9 kg), SpO2 96 %.  PHYSICAL EXAMINATION:  Physical Exam  GENERAL:  66 y.o.-year-old patient lying in the bed with no acute distress.  obesity. EYES: Pupils equal, round, reactive to light and accommodation. No scleral icterus. Extraocular muscles intact.  HEENT: Head atraumatic, normocephalic. Oropharynx and nasopharynx clear.  NECK:  Supple, no jugular venous distention. No thyroid enlargement, no tenderness.  LUNGS: Normal breath sounds bilaterally, no wheezing, rales,rhonchi or crepitation. No use of accessory muscles of respiration.  CARDIOVASCULAR: S1, S2 normal. No murmurs, rubs, or gallops.  ABDOMEN: Soft, nontender, nondistended. Bowel sounds present. No organomegaly or mass.  EXTREMITIES: No pedal edema, cyanosis, or clubbing.  NEUROLOGIC: Cranial nerves II through XII are intact. Muscle  strength 5/5 in all extremities. Sensation intact. Gait not checked.  PSYCHIATRIC: The patient is alert and oriented x 3.  SKIN: No obvious rash, lesion, or ulcer.   LABORATORY PANEL:   CBC  Recent Labs Lab 10/17/16 2121  WBC 1.3*  HGB 9.5*  HCT 30.6*  PLT 51*   ------------------------------------------------------------------------------------------------------------------  Chemistries   Recent Labs Lab 10/17/16 2121  NA 132*  K 4.0  CL 100*  CO2 15*  GLUCOSE 463*  BUN 14  CREATININE 1.18*  CALCIUM 8.8*  AST 55*  ALT 30  ALKPHOS 161*  BILITOT 2.2*   ------------------------------------------------------------------------------------------------------------------  Cardiac Enzymes  Recent Labs Lab 10/17/16 2121  TROPONINI 0.05*    ------------------------------------------------------------------------------------------------------------------  RADIOLOGY:  Dg Chest Port 1 View  Result Date: 10/17/2016 CLINICAL DATA:  Dyspnea starting this morning and chills last evening. EXAM: PORTABLE CHEST 1 VIEW COMPARISON:  02/22/2016 CT, CXR report 03/27/2016 FINDINGS: The cardiac silhouette appears enlarged but some of this is likely due to the portable technique. Mild central vascular congestion. No pneumonic consolidation, effusion or pneumothorax. Minimal aortic atherosclerosis. No acute nor suspicious osseous lesions. IMPRESSION: 1. Borderline cardiomegaly with mild vascular congestion. 2. Minimal aortic atherosclerosis. Electronically Signed   By: Ashley Royalty M.D.   On: 10/17/2016 21:41      IMPRESSION AND PLAN:   Sepsis due to UTI. The patient will be admitted to stepdown unit. Start sepsis protocol, continue antibiotics, follow-up CBC and cultures.  Septic shock. Give normal saline bolus, Levophed when necessary.  Lactic acidosis. Follow-up potassium level. Treatment as above. Elevated troponin. Possibly due to sepsis. Continue aspirin.   Neutropenia. Follow-up CBC.  Thrombocytopenia. Looks like chronic, possible due to no alcohol colic liver cirrhosis. follow-up CBC.  Diabetes. Start sliding scale and continue NPH. hold metformin and glimepiride Tobacco abuse. Smoking cessation was counseled for 3-4 minutes. She doesn't want nicotine patch.  I discussed with ICU NP Hinton Dyer. All the records are reviewed and case discussed with ED provider. Management plans discussed with the patient, her husband and they are in agreement.  CODE STATUS: full code  TOTAL critical TIME TAKING CARE OF THIS PATIENT: 62 minutes.    Demetrios Loll M.D on 10/18/2016 at 12:01 AM  Between 7am to 6pm - Pager - [phone]  After 6pm go to [email or web address] - password [email or web address] [facility]  [facility] [place] [facility]  Office   [phone]  CC: Primary care physician; Adin Hector, MD   Note: This dictation was prepared with Dragon dictation along with smaller phrase technology. Any transcriptional errors that result from this process are unin

## 2016-10-18 NOTE — Progress Notes (Signed)
Pt remains alert and oriented.  She is very anxious and at times feels short of breath.  However, she refuses Bipap and is on 4LNC.  Pt is on neo-synephrine for soft blood pressures and has had 3L of normal saline boluses.  Potassium is being replaced.  There are 2 bags of potassium replacement left to give due to pt's request to run potassium slower through the IV.  Pt has had 3 watery bowel movements and has been placed on enteric isolation precautions.  Stool sample needs to be collected.  Report given to [place], rn.

## 2016-10-18 NOTE — ED Notes (Signed)
MD paged to address hypotension.

## 2016-10-18 NOTE — Progress Notes (Addendum)
Pharmacy Antibiotic Note  Vanessa Oneill is a 66 y.o. female admitted on 10/17/2016 with UTI.  Pharmacy has been consulted for aztreonam and Levaquin dosing.  Plan: Aztreonam 1 gram  q 8 hours ordered. Levaquin 750 mg q 48 hours ordered.  Height: 5\' 7"  (170.2 cm) Weight: 208 lb 8.9 oz (94.6 kg) IBW/kg (Calculated) : 61.6  Temp (24hrs), Avg:100.3 F (37.9 C), Min:97.5 F (36.4 C), Max:106.3 F (41.3 C)   Recent Labs Lab 10/17/16 2121 10/18/16 0007 10/18/16 0114  WBC 1.3*  --  4.5  CREATININE 1.18*  --  1.40*  LATICACIDVEN 7.3* 6.5*  --     Estimated Creatinine Clearance: 47.3 mL/min (A) (by C-G formula based on SCr of 1.4 mg/dL (H)).    Allergies  Allergen Reactions  . Tylenol [Acetaminophen] Other (See Comments)    "tylenol 3" chest pains  . Amoxicillin Itching and Rash    Antimicrobials this admission: Zosyn x1; aztreonam, Levaquin 10/17  >>    >>   Dose adjustments this admission:   Microbiology results: 10/16 BCx: pending 10/16 UCx: pending  10/17 MRSA PCR: (-)      10/16: UA LE(-)  NO2(+) WBC TNTC  Thank you for allowing pharmacy to be a part of this patient's care.  Laine Giovanetti S 10/18/2016 4:19 AM

## 2016-10-18 NOTE — Consult Note (Signed)
Name: Vanessa Oneill MRN: [id number] DOB: [birth year]    ADMISSION DATE:  10/17/2016 CONSULTATION DATE:  10/18/16  REFERRING MD :  Dr. Bridgett Larsson  CHIEF COMPLAINT:  Fever and Chills for 2-3 days  BRIEF PATIENT DESCRIPTION: 66 year old female with sepsis related to UTI  SIGNIFICANT EVENTS  10/18/16 >> Patient admitted to the SDU with septic shock related to UTI, now requiring pressors  STUDIES:  07/23/16 ECHO>The cavity size was normal. Wall thickness was  normal. Systolic function was normal. The estimated ejection  fraction was in the range of 55% to 65%.  HISTORY OF PRESENT ILLNESS: Vanessa Oneill  Is a 66 year old female with known history of DM,HTN,COPD,Non alcoholic cirrhosis and tobacco abuse.  Patient presented to ED on 10/16 with 2-3 days of fever and chills. Patient noted to be in septic shock  Related to UTI. Patient had a fever of 106F.  Patient denies any dysuria,burning or hematuria but is complaining of increased frequency.  Patient was noted to be hypotensive and tachycardic therefore was given fluids and started on antibiotics.  Patient received 3 liters of fluid bolus but remains hypotensive .  Therefore patient will be admitted to the ICU for possible administration of   Pressors for shock not responding to I/v fluids treatment.  PAST MEDICAL HISTORY :   has a past medical history of COPD (chronic obstructive pulmonary disease) ([place]); Diabetes mellitus without complication ([place]); Hypertension; 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[place]); and Personal history of tobacco use, presenting hazards to health (11/12/2014).  has a past surgical history that includes Breast biopsy (Right); Cholecystectomy; Abdominal hysterectomy; and Esophagogastroduodenoscopy (egd) with propofol (N/A, 07/24/2016). Prior to Admission medications   Medication Sig Start Date End Date Taking? Authorizing Provider  ALPRAZolam Duanne Moron) 0.25 MG tablet Take 0.25 mg by mouth at bedtime as needed for sleep. 07/06/16    [provider]  aspirin EC 81 MG tablet Take 81 mg by mouth daily.    [provider]  buPROPion (WELLBUTRIN XL) 150 MG 24 hr tablet Take 150 mg by mouth 2 (two) times daily. 06/17/16   [provider]  cyclobenzaprine (FLEXERIL) 10 MG tablet Take 10 mg by mouth 3 (three) times daily as needed for muscle spasms. 09/20/15   [provider]  escitalopram (LEXAPRO) 10 MG tablet Take 10 mg by mouth daily. 07/08/16   [provider]  gabapentin (NEURONTIN) 100 MG capsule Take 100 mg by mouth 2 (two) times daily.    [provider]  glimepiride (AMARYL) 4 MG tablet Take 4 mg by mouth daily with breakfast.    [provider]  hydrochlorothiazide (HYDRODIURIL) 25 MG tablet Take 25 mg by mouth daily. 05/17/16   [provider]  HYDROcodone-acetaminophen (NORCO/VICODIN) 5-325 MG tablet Take 1 tablet by mouth every 6 (six) hours as needed for moderate pain.    [provider]  insulin NPH Human (HUMULIN N,NOVOLIN N) 100 UNIT/ML injection Inject 20-30 Units into the skin 3 (three) times daily - between meals and at bedtime. Use 30 units in the morning, 20 units during the day and 30 units at night.    [provider]  insulin regular (NOVOLIN R,HUMULIN R) 100 units/mL injection Inject 20 Units into the skin 2 (two) times daily before lunch and supper.     [provider]  ketorolac (TORADOL) 10 MG tablet Take 10 mg by mouth daily as needed for headache. 07/07/16   [provider]  levofloxacin (LEVAQUIN) 500 MG tablet Take 1  tablet (500 mg total) by mouth daily. 07/25/16   Max Sane, MD  losartan (COZAAR) 100 MG tablet Take 100 mg by mouth daily.    [provider]  metFORMIN (GLUCOPHAGE-XR) 500 MG 24 hr tablet Take 1,000 mg by mouth daily. 03/07/16   [provider]  nadolol (CORGARD) 80 MG tablet Take 80 mg by mouth daily.    [provider]  pantoprazole (PROTONIX) 40 MG tablet Take  40 mg by mouth 2 (two) times daily.     [provider]  rOPINIRole (REQUIP) 1 MG tablet Take 1 mg by mouth at bedtime.     [provider]  sucralfate (CARAFATE) 1 GM/10ML suspension Take 10 mLs (1 g total) by mouth 4 (four) times daily as needed (chest discomfort). 07/25/16   Max Sane, MD  SUMAtriptan (IMITREX) 50 MG tablet Take 1 tablet (50 mg total) by mouth every 2 (two) hours as needed for migraine. May repeat in 2 hours if headache persists or recurs. Patient not taking: Reported on 07/22/2016 01/28/15   Heriberto Antigua, MD  traZODone (DESYREL) 50 MG tablet Take 50 mg by mouth at bedtime as needed for sleep. 06/19/16   [provider]   Allergies  Allergen Reactions  . Tylenol [Acetaminophen] Other (See Comments)    "tylenol 3" chest pains  . Amoxicillin Itching and Rash    FAMILY HISTORY:  family history includes Lung cancer in her father. SOCIAL HISTORY:  reports that she has been smoking Cigarettes.  She has a 48.00 pack-year smoking history. She has never used smokeless tobacco. She reports that she does not drink alcohol or use drugs.  REVIEW OF SYSTEMS:  Bold pertaining to positives Constitutional: Negative for fever, chills, weight loss, malaise/fatigue and diaphoresis.  HENT: Negative for hearing loss, ear pain, nosebleeds, congestion, sore throat, neck pain, tinnitus and ear discharge.   Eyes: Negative for blurred vision, double vision, photophobia, pain, discharge and redness.  Respiratory: Negative for cough, hemoptysis, sputum production, shortness of breath, wheezing and stridor.   Cardiovascular: Negative for chest pain, palpitations, orthopnea, claudication, leg swelling and PND.  Gastrointestinal: Negative for heartburn, nausea, vomiting, abdominal pain, diarrhea, constipation, blood in stool and melena.  Genitourinary: Negative for dysuria, urgency, frequency, hematuria and flank pain.  Musculoskeletal: Negative for myalgias, back pain,  joint pain and falls.  Skin: Negative for itching and rash.  Neurological: Negative for dizziness, tingling, tremors, sensory change, speech change, focal weakness, seizures, loss of consciousness, weakness and headaches.  Endo/Heme/Allergies: Negative for environmental allergies and polydipsia. Does not bruise/bleed easily.  SUBJECTIVE: Patient states that "she has not been felling well for couple days"  VITAL SIGNS: Temp:  [98.2 F (36.8 C)-106.3 F (41.3 C)] 98.2 F (36.8 C) (10/17 0013) Pulse Rate:  [88-103] 92 (10/17 0009) Resp:  [14-45] 33 (10/17 0009) BP: (80-151)/(53-69) 80/53 (10/17 0009) SpO2:  [93 %-100 %] 98 % (10/17 0009) Weight:  [83.9 kg (185 lb)] 83.9 kg (185 lb) (10/16 2052)  PHYSICAL EXAMINATION: General:  66 year old female in no acute distress Neuro: Awake,Alert and oriented HEENT:  AT,[place],No jvd Cardiovascular:  S1s2,regular,no m/r/g Lungs:  Clear bilaterally, no wheezes,crackles or rhonchi noted Abdomen:  Soft,NT,ND Musculoskeletal:  No edema,cyanosis Skin: Warm,dry and intact   Recent Labs Lab 10/17/16 2121  NA 132*  K 4.0  CL 100*  CO2 15*  BUN 14  CREATININE 1.18*  GLUCOSE 463*    Recent Labs Lab 10/17/16 2121  HGB 9.5*  HCT 30.6*  WBC 1.3*  PLT 51*   Dg Chest Port 1 View  Result Date: 10/17/2016 CLINICAL DATA:  Dyspnea starting this morning and chills last evening. EXAM: PORTABLE CHEST 1 VIEW COMPARISON:  02/22/2016 CT, CXR report 03/27/2016 FINDINGS: The cardiac silhouette appears enlarged but some of this is likely due to the portable technique. Mild central vascular congestion. No pneumonic consolidation, effusion or pneumothorax. Minimal aortic atherosclerosis. No acute nor suspicious osseous lesions. IMPRESSION: 1. Borderline cardiomegaly with mild vascular congestion. 2. Minimal aortic atherosclerosis. Electronically Signed   By: Ashley Royalty M.D.   On: 10/17/2016 21:41    ASSESSMENT / PLAN:  UROSEPSIS Septic shock related to  UTI Hyponatremia Acute Kidney Injury related to sepsis with UTI Hyperglycemia Severe Neutropenia Elevated Troponin Lactic acidosis Hx of COPD Tobacco abuse Hx of Non alcoholic liver cirrhosis  Plan Continue levaquin/Aztreonam Follow cultures Monitor Fever,CBC Patient may need pressors, Will start on neosynephrine Keep MAP goals>65 Trend troponin Replace electrolytes  Blood glucose checks with SSI coverage Follow BMET Blood glucose checks with SSI coverage Trend lactic acid/PCT Hold home dose losartan/nadolol Patient refuses nicotine patch Bronchodilators   Bincy Varughese,AG-ACNP Pulmonary and [place]   10/18/2016, 12:35 AM   PCCM ATTENDING ATTESTATION:  I have evaluated patient with the APP Varughese, reviewed database in its entirety and discussed care plan in detail. In addition, this patient was discussed on multidisciplinary rounds.    Major problems addressed by PCCM team: Severe sepsis with the septic shock Escherichia coli bacteremia Suspected urinary tract source  PLAN/REC: Change antibiotics to meropenem pending sensitivities Continue phenylephrine to maintain MAP > 65 mmHg Renal ultrasound to rule out hydronephrosis and stones   Merton Border, MD PCCM service Mobile [phone] Pager [phone] 10/18/2016 2:46 PM

## 2016-10-18 NOTE — Progress Notes (Signed)
Admitted this morning for sepsis, UTI. Patient is very restless now, patient was confused on admission. But now alert. But very restless. Labs, vitals reviewed. Physical examination: Alert, awake, oriented but very restless. Cardiovascular: S1, S2 regular. Lungs: Patient has faint expiratory wheeze especially in the upper lobes. Abdomen: Soft, nontender, nondistended. Neurologically: Patient alert, awake, oriented.  #1. metabolic encephalopathy secondary to sepsis: Patient is less confused today. #2. sepsis secondary to UTI: Urine cultures, continue IV fluids, patient has septic shock requiring Pressors. Continue Neo-Synephrine and keep him he appears around 60. #3. COPD with exacerbation: Patient is a heavy smoker mild wheezing and shortness of breath. The patient will be on bronchodilators, add IV steroids #4. restless leg syndrome: Patient is on Requip, he ordered that advised the nurse to give this now. #5.. history of anxiety, depression. Patient is on Xanax, Wellbutrin, Lexapro. #6 .history of diabetes mellitus type 2: Poor by mouth intake. #7. hypotension so hold BP medicines #8. diabetes mellitus type 2: Hold by mouth diabetic medications, continue sliding scale with coverage at this time. Once the by mouth intake is better to resume diabetic medications. #9 .hypokalemia due to poor by mouth intake 'replace the potassium.  D/w husband  Time spent 20 minutes.

## 2016-10-19 ENCOUNTER — Inpatient Hospital Stay: Payer: Medicare Other

## 2016-10-19 DIAGNOSIS — R7881 Bacteremia: Secondary | ICD-10-CM

## 2016-10-19 LAB — COMPREHENSIVE METABOLIC PANEL
ALT: 25 U/L (ref 14–54)
ANION GAP: 6 (ref 5–15)
AST: 27 U/L (ref 15–41)
Albumin: 2.8 g/dL — ABNORMAL LOW (ref 3.5–5.0)
Alkaline Phosphatase: 66 U/L (ref 38–126)
BILIRUBIN TOTAL: 1 mg/dL (ref 0.3–1.2)
BUN: 23 mg/dL — AB (ref 6–20)
CO2: 17 mmol/L — ABNORMAL LOW (ref 22–32)
Calcium: 7.6 mg/dL — ABNORMAL LOW (ref 8.9–10.3)
Chloride: 112 mmol/L — ABNORMAL HIGH (ref 101–111)
Creatinine, Ser: 0.99 mg/dL (ref 0.44–1.00)
GFR calc Af Amer: >60 mL/min (ref >60)
GFR, EST NON AFRICAN AMERICAN: 59 mL/min — AB (ref >60)
Glucose, Bld: 273 mg/dL — ABNORMAL HIGH (ref 65–99)
POTASSIUM: 5.2 mmol/L — AB (ref 3.5–5.1)
Sodium: 135 mmol/L (ref 135–145)
TOTAL PROTEIN: 6.3 g/dL — AB (ref 6.5–8.1)

## 2016-10-19 LAB — GLUCOSE, CAPILLARY
GLUCOSE-CAPILLARY: 213 mg/dL — AB (ref 65–99)
GLUCOSE-CAPILLARY: 144 mg/dL — AB (ref 65–99)
GLUCOSE-CAPILLARY: 110 mg/dL — AB (ref 65–99)
Glucose-Capillary: 197 mg/dL — ABNORMAL HIGH (ref 65–99)
Glucose-Capillary: 197 mg/dL — ABNORMAL HIGH (ref 65–99)
Glucose-Capillary: 57 mg/dL — ABNORMAL LOW (ref 65–99)
Glucose-Capillary: 66 mg/dL (ref 65–99)
Glucose-Capillary: 59 mg/dL — ABNORMAL LOW (ref 65–99)

## 2016-10-19 LAB — CBC
HEMATOCRIT: 25.9 % — AB (ref 35.0–47.0)
Hemoglobin: 7.9 g/dL — ABNORMAL LOW (ref 12.0–16.0)
MCH: 22.1 pg — ABNORMAL LOW (ref 26.0–34.0)
MCHC: 30.6 g/dL — AB (ref 32.0–36.0)
MCV: 72.2 fL — AB (ref 80.0–100.0)
Platelets: 48 10*3/uL — ABNORMAL LOW (ref 150–440)
RBC: 3.58 MIL/uL — ABNORMAL LOW (ref 3.80–5.20)
RDW: 20 % — AB (ref 11.5–14.5)
WBC: 9.6 10*3/uL (ref 3.6–11.0)

## 2016-10-19 MED ORDER — MORPHINE SULFATE (PF) 2 MG/ML IV SOLN
2.0000 mg | Freq: Once | INTRAVENOUS | Status: AC
  Administered 2016-10-19: 2 mg via INTRAVENOUS
  Filled 2016-10-19: qty 1

## 2016-10-19 MED ORDER — NICOTINE 21 MG/24HR TD PT24
21.0000 mg | MEDICATED_PATCH | Freq: Every day | TRANSDERMAL | Status: DC
  Administered 2016-10-20 – 2016-10-21 (×2): 21 mg via TRANSDERMAL
  Filled 2016-10-19 (×2): qty 1

## 2016-10-19 MED ORDER — LORAZEPAM 2 MG/ML IJ SOLN
INTRAMUSCULAR | Status: AC
  Administered 2016-10-19: 2 mg via INTRAVENOUS
  Filled 2016-10-19: qty 1

## 2016-10-19 MED ORDER — SODIUM CHLORIDE 0.9 % IV SOLN
1.0000 g | Freq: Three times a day (TID) | INTRAVENOUS | Status: DC
  Administered 2016-10-19 – 2016-10-24 (×14): 1 g via INTRAVENOUS
  Filled 2016-10-19 (×16): qty 1

## 2016-10-19 MED ORDER — LORAZEPAM 2 MG/ML IJ SOLN
2.0000 mg | Freq: Once | INTRAMUSCULAR | Status: AC
  Administered 2016-10-19: 2 mg via INTRAVENOUS

## 2016-10-19 MED ORDER — MORPHINE SULFATE (PF) 2 MG/ML IV SOLN
INTRAVENOUS | Status: AC
  Filled 2016-10-19: qty 1

## 2016-10-19 MED ORDER — INSULIN DETEMIR 100 UNIT/ML ~~LOC~~ SOLN
15.0000 [IU] | Freq: Once | SUBCUTANEOUS | Status: AC
  Administered 2016-10-19: 15 [IU] via SUBCUTANEOUS
  Filled 2016-10-19: qty 0.15

## 2016-10-19 MED ORDER — MORPHINE SULFATE (PF) 2 MG/ML IV SOLN
1.0000 mg | Freq: Once | INTRAVENOUS | Status: AC
  Administered 2016-10-19: 1 mg via INTRAVENOUS

## 2016-10-19 MED ORDER — DEXTROSE 50 % IV SOLN
25.0000 mL | Freq: Once | INTRAVENOUS | Status: AC
  Administered 2016-10-19: 25 mL via INTRAVENOUS

## 2016-10-19 MED ORDER — INSULIN DETEMIR 100 UNIT/ML ~~LOC~~ SOLN
20.0000 [IU] | Freq: Two times a day (BID) | SUBCUTANEOUS | Status: DC
  Filled 2016-10-19 (×3): qty 0.2

## 2016-10-19 MED ORDER — DEXTROSE 50 % IV SOLN
INTRAVENOUS | Status: AC
  Filled 2016-10-19: qty 50

## 2016-10-19 MED ORDER — DEXMEDETOMIDINE HCL IN NACL 400 MCG/100ML IV SOLN: 0.4000 ug/kg/h | INTRAVENOUS | Status: DC

## 2016-10-19 MED ORDER — FUROSEMIDE 10 MG/ML IJ SOLN
40.0000 mg | Freq: Once | INTRAMUSCULAR | Status: AC
  Administered 2016-10-19: 40 mg via INTRAVENOUS
  Filled 2016-10-19: qty 4

## 2016-10-19 NOTE — Progress Notes (Signed)
Nighttime CBG at 2051 was 57. Patient was given juice. Recheck at 2109 was 66, recheck at 2140 was back down to 59. Patient was back on bipap at this time. 5ml of D50 given. Recheck CBG at 2205 was 110.

## 2016-10-19 NOTE — Progress Notes (Signed)
Inpatient Diabetes Program Recommendations  AACE/ADA: New Consensus Statement on Inpatient Glycemic Control (2015)  Target Ranges:  Prepandial:   less than 140 mg/dL      Peak postprandial:   less than 180 mg/dL (1-2 hours)      Critically ill patients:  140 - 180 mg/dL   Lab Results  Component Value Date   GLUCAP 213 (H) 10/19/2016   HGBA1C 8.2 (H) 07/22/2016    Review of Glycemic Control  Results for AANIKA, DEFOOR (MRN [id number]) as of 10/19/2016 09:58  Ref. Range 10/18/2016 08:03 10/18/2016 11:46 10/18/2016 16:47 10/18/2016 21:06 10/19/2016 07:17  Glucose-Capillary Latest Ref Range: 65 - 99 mg/dL 196 (H) 211 (H) 182 (H) 284 (H) 213 (H)    Diabetes history: Type 2 Outpatient Diabetes medications:Amaryl 4mg  qam , Novolin 30 units tid with meals, Metformin 1000mg  qam  Current orders for Inpatient glycemic control: Levemir 30 units qhs, Novolog 0-15 units tid, Novolog 0-5 units qhs * steroids 60mg  q24h  Inpatient Diabetes Program Recommendations: Since patient is on steroids and CBG are elevated- Consider increasing Levemir to 38 units qhs (0.4units/kg). Consider increasing correction insulin to resistant correction scale.  (she did not eat breakfast so I do not recommend a set dose of Novolog set until she is taking in at least 50%).  Gentry Fitz, RN, BA, MHA, CDE Diabetes Coordinator Inpatient Diabetes Program  [phone] (Team Pager) [phone] ([place]) 10/19/2016 10:09 AM

## 2016-10-19 NOTE — Progress Notes (Signed)
This nurse called to room by nurse tech [place]. Pt was supine in the chair and trying to get up. Pt stood up and urinated in the floor. Floor cleaned, pt cleaned up, and brief placed. Pt back in bed. Will continue to assess.

## 2016-10-19 NOTE — Progress Notes (Signed)
After transferring the patient to bedside recliner pt audibly wheezing. Pt requesting Xanax for anxiety. Pt given PRN Xanax, and PRN albuterol treatment. Pt remains sitting in bedside recliner. Family at bedside. Will continue to assess.

## 2016-10-19 NOTE — Progress Notes (Signed)
[place] [facility] [place] NAME: Vanessa Oneill    MR#:  [id number]  DATE OF BIRTH:  [birth year]  SUBJECTIVE:  CHIEF COMPLAINT:   Chief Complaint  Patient presents with  . Code Sepsis   - Feels tired this morning. Remains on 5 L nasal cannula. Not on home oxygen. -Off pressors this morning. Being treated for Escherichia coli bacteremia and sepsis  REVIEW OF SYSTEMS:  Review of Systems  Constitutional: Positive for malaise/fatigue. Negative for chills and fever.  HENT: Negative for congestion, ear discharge, ear pain, hearing loss and nosebleeds.   Eyes: Negative for blurred vision and double vision.  Respiratory: Positive for shortness of breath and wheezing. Negative for cough.   Cardiovascular: Negative for chest pain, palpitations and leg swelling.  Gastrointestinal: Negative for abdominal pain, constipation, diarrhea, nausea and vomiting.  Genitourinary: Negative for dysuria and urgency.  Musculoskeletal: Positive for myalgias.  Neurological: Negative for dizziness, speech change, focal weakness, seizures and headaches.  Psychiatric/Behavioral: Negative for depression.    DRUG ALLERGIES:   Allergies  Allergen Reactions  . Tylenol [Acetaminophen] Other (See Comments)    "tylenol 3" chest pains  . Amoxicillin Itching and Rash    VITALS:  Blood pressure 138/66, pulse 80, temperature (!) 97.5 F (36.4 C), temperature source Oral, resp. rate (!) 21, height 5\' 7"  (1.702 m), weight 94.6 kg (208 lb 8.9 oz), SpO2 100 %.  PHYSICAL EXAMINATION:  Physical Exam  GENERAL:  66 y.o.-year-old patient lying in the bed with no acute distress.  EYES: Pupils equal, round, reactive to light and accommodation. No scleral icterus. Extraocular muscles intact.  Prefers to keep her eyes closed today. HEENT: Head atraumatic, normocephalic. Oropharynx and nasopharynx clear.  NECK:  Supple, no jugular venous distention. No thyroid enlargement, no  tenderness.  LUNGS: Normal breath sounds bilaterally, however more coarse breath sounds on the right side, no wheezing, rales,rhonchi or crepitation. No use of accessory muscles of respiration.  CARDIOVASCULAR: S1, S2 normal. No murmurs, rubs, or gallops.  ABDOMEN: Soft, nontender,  slightly distended. Bowel sounds present. No organomegaly or mass.  EXTREMITIES: No pedal edema, cyanosis, or clubbing.  NEUROLOGIC: Cranial nerves II through XII are intact. Muscle strength 5/5 in all extremities. Sensation intact. Gait not checked.  PSYCHIATRIC: The patient is alert and oriented x 2-3.  SKIN: No obvious rash, lesion, or ulcer.    LABORATORY PANEL:   CBC  Recent Labs Lab 10/19/16 0336  WBC 9.6  HGB 7.9*  HCT 25.9*  PLT 48*   ------------------------------------------------------------------------------------------------------------------  Chemistries   Recent Labs Lab 10/19/16 0336  NA 135  K 5.2*  CL 112*  CO2 17*  GLUCOSE 273*  BUN 23*  CREATININE 0.99  CALCIUM 7.6*  AST 27  ALT 25  ALKPHOS 66  BILITOT 1.0   ------------------------------------------------------------------------------------------------------------------  Cardiac Enzymes  Recent Labs Lab 10/18/16 0114  TROPONINI 0.08*   ------------------------------------------------------------------------------------------------------------------  RADIOLOGY:  US Renal  Result Date: 10/18/2016 CLINICAL DATA:  Urinary tract infection.  Sepsis and bacteremia. EXAM: RENAL / URINARY TRACT ULTRASOUND COMPLETE COMPARISON:  07/23/2016 FINDINGS: Right Kidney: Length: 12.3 cm. Echogenicity within normal limits. No mass or hydronephrosis visualized. Left Kidney: Length: 11.0 cm. Echogenicity within normal limits. No mass or hydronephrosis visualized. Bladder: Appears normal for degree of bladder distention. IMPRESSION: Normal renal sonogram. Electronically Signed   By: Kerby Moors M.D.   On: 10/18/2016 17:01   Dg  Chest Port 1 View  Result Date: 10/19/2016 CLINICAL DATA:  Respiratory failure. EXAM: PORTABLE CHEST 1 VIEW COMPARISON:  10/17/2016. FINDINGS: Cardiomegaly with pulmonary venous congestion and bilateral from interstitial prominence. Bilateral pleural effusions crash that small bilateral scratched it small right pleural effusion. Findings consistent with CHF. No pneumothorax . IMPRESSION: Findings consistent congestive heart failure with bilateral pulmonary interstitial edema and small right pleural effusion. Findings are new from prior exam. Electronically Signed   By: [place]   On: 10/19/2016 06:36   Dg Chest Port 1 View  Result Date: 10/17/2016 CLINICAL DATA:  Dyspnea starting this morning and chills last evening. EXAM: PORTABLE CHEST 1 VIEW COMPARISON:  02/22/2016 CT, CXR report 03/27/2016 FINDINGS: The cardiac silhouette appears enlarged but some of this is likely due to the portable technique. Mild central vascular congestion. No pneumonic consolidation, effusion or pneumothorax. Minimal aortic atherosclerosis. No acute nor suspicious osseous lesions. IMPRESSION: 1. Borderline cardiomegaly with mild vascular congestion. 2. Minimal aortic atherosclerosis. Electronically Signed   By: Ashley Royalty M.D.   On: 10/17/2016 21:41    EKG:   Orders placed or performed during the hospital encounter of 10/17/16  . ED EKG 12-Lead  . ED EKG 12-Lead    ASSESSMENT AND PLAN:   66 y/o F with PMH of hypertension, COPD, ongoing smoking, diabetes, non alcoholic hepatic cirrhosis admitted for Shortness of breath, confusion and sepsis  * Sepsis-secondary to Escherichia coli bacteremia and UTI -final culture results are pending. Off pressors -Continue Rocephin  * Metabolic encephalopathy-secondary to sepsis. Improving  * Acute respiratory failure-secondary to COPD exacerbation.   required BiPAP last night -Currently on 5 L nasal cannula. Not on home oxygen. -Continue IV steroids. Wean off oxygen  as tolerated. - one dose of lasix given for mild congestion on CXR  * Liver cirrhosis-known history of esophageal varices requiring banding-last one at the end of July.  -Needs outpatient follow-up for repeat EGD. No hematemesis at this time. Monitor  * DM- on levemir and ssi -appreciate diabetes co-ordinators input  * Depression and anxiety- continue home meds  * Tobacco use disorder- nicotine patch  * DVT Prophylaxis- TEDs and SCDs- due to anemia   All the records are reviewed and case discussed with Care Management/Social Workerr. Management plans discussed with the patient, family and they are in agreement.  CODE STATUS: Full code  TOTAL TIME TAKING CARE OF THIS PATIENT: 37 minutes.   POSSIBLE D/C IN 2 DAYS, DEPENDING ON CLINICAL CONDITION.   Gladstone Lighter M.D on 10/19/2016 at 10:20 AM  Between 7am to 6pm - Pager - [phone]  After 6pm go to [email or web address] - password [email or web address] [facility]  Office  [phone]  CC: Primary care physician; Adin Hector, MD

## 2016-10-19 NOTE — Progress Notes (Signed)
Name: Vanessa Oneill MRN: [id number] DOB: October 14, 1950    ADMISSION DATE:  10/17/2016 CONSULTATION DATE:  10/18/16  REFERRING MD :  Dr. Bridgett Larsson  CHIEF COMPLAINT:  Fever and Chills for 2-3 days  BRIEF PATIENT DESCRIPTION:  66 year old female with Ssvere sepsis/septic shock due to UTI with Escherichia coli bacteremia   STUDIES:  10/17 RUQ [name]: no hydronephrosis or acute abnormalities   SUBJECTIVE:  Briefly required BiPAP last night. Presently, comfortable on nasal cannula oxygen. No new complaints. Requests nicotine patch. Off of vasopressors since this morning.  VITAL SIGNS: Temp:  [96.6 F (35.9 C)-97.7 F (36.5 C)] 97.5 F (36.4 C) (10/18 0820) Pulse Rate:  [46-84] 80 (10/18 0900) Resp:  [15-34] 21 (10/18 0937) BP: (46-142)/(28-95) 138/66 (10/18 0900) SpO2:  [79 %-100 %] 100 % (10/18 0937) FiO2 (%):  [35 %-40 %] 40 % (10/17 1900)  PHYSICAL EXAMINATION: General: NAD Neuro: no focal deficits HEENT:  NCAT, sclerae white Cardiovascular:  Egular, no M Lungs:  Bibasilar crackles, no wheezes Abdomen:  Soft, NABS Ext: warm, no edema   Recent Labs Lab 10/18/16 0114 10/18/16 2000 10/19/16 0336  NA 135 135 135  K 2.8* 5.5* 5.2*  CL 109 111 112*  CO2 16* 17* 17*  BUN 17 22* 23*  CREATININE 1.40* 1.34* 0.99  GLUCOSE 339* 290* 273*    Recent Labs Lab 10/18/16 0114 10/18/16 2000 10/19/16 0336  HGB 7.7* 8.6* 7.9*  HCT 24.9* 27.9* 25.9*  WBC 4.5 13.3* 9.6  PLT 40* 56* 48*   US Renal  Result Date: 10/18/2016 CLINICAL DATA:  Urinary tract infection.  Sepsis and bacteremia. EXAM: RENAL / URINARY TRACT ULTRASOUND COMPLETE COMPARISON:  07/23/2016 FINDINGS: Right Kidney: Length: 12.3 cm. Echogenicity within normal limits. No mass or hydronephrosis visualized. Left Kidney: Length: 11.0 cm. Echogenicity within normal limits. No mass or hydronephrosis visualized. Bladder: Appears normal for degree of bladder distention. IMPRESSION: Normal renal sonogram. Electronically  Signed   By: Kerby Moors M.D.   On: 10/18/2016 17:01   Dg Chest Port 1 View  Result Date: 10/19/2016 CLINICAL DATA:  Respiratory failure. EXAM: PORTABLE CHEST 1 VIEW COMPARISON:  10/17/2016. FINDINGS: Cardiomegaly with pulmonary venous congestion and bilateral from interstitial prominence. Bilateral pleural effusions crash that small bilateral scratched it small right pleural effusion. Findings consistent with CHF. No pneumothorax . IMPRESSION: Findings consistent congestive heart failure with bilateral pulmonary interstitial edema and small right pleural effusion. Findings are new from prior exam. Electronically Signed   By: [place]   On: 10/19/2016 06:36   Dg Chest Port 1 View  Result Date: 10/17/2016 CLINICAL DATA:  Dyspnea starting this morning and chills last evening. EXAM: PORTABLE CHEST 1 VIEW COMPARISON:  02/22/2016 CT, CXR report 03/27/2016 FINDINGS: The cardiac silhouette appears enlarged but some of this is likely due to the portable technique. Mild central vascular congestion. No pneumonic consolidation, effusion or pneumothorax. Minimal aortic atherosclerosis. No acute nor suspicious osseous lesions. IMPRESSION: 1. Borderline cardiomegaly with mild vascular congestion. 2. Minimal aortic atherosclerosis. Electronically Signed   By: Ashley Royalty M.D.   On: 10/17/2016 21:41    ASSESSMENT / PLAN: Severe sepsis/septic shock Urinary tract infection Escherichia coli bacteremia AKI, resolving Hypervolemia due to volume resuscitation DM 2 with hyperglycemia Neutropenia, resolved Thrombocytopenia Smoker with hx of COPD Non alcoholic liver cirrhosis  Plan Change to SDU status Continue meropenem pending sensitivities  If not ESBL, will narrow coverage Monitor BMET intermittently Monitor I/Os Correct electrolytes as indicated  Cont LA insulin  and SSI DVT px: SCDs Monitor CBC/platelets intermittently Transfuse per usual guidelines  Furosemide 1 dose 10/18 Nicotine  patch per patient's request  Merton Border, MD PCCM service Mobile [phone] Pager [phone] 10/19/2016 11:50 AM

## 2016-10-19 NOTE — Progress Notes (Signed)
PHARMACY NOTE:  ANTIMICROBIAL RENAL DOSAGE ADJUSTMENT  Current antimicrobial regimen includes a mismatch between antimicrobial dosage and estimated renal function.  As per policy approved by the Pharmacy & Therapeutics and Medical Executive Committees, the antimicrobial dosage will be adjusted accordingly.  Current antimicrobial dosage:  Meropenem 1g IV Q12hr.  Indication: Sepsis   Renal Function:  Estimated Creatinine Clearance: 66.9 mL/min (by C-G formula based on SCr of 0.99 mg/dL).     Antimicrobial dosage has been changed to:  Meropenem 1g IV Q8hr.    Additional comments: Will continue to monitor and adjust per protocol.    Thank you for allowing pharmacy to be a part of this patient's care.  Simpson,Michael L, [facility] 10/19/2016 4:46 PM

## 2016-10-20 DIAGNOSIS — R410 Disorientation, unspecified: Secondary | ICD-10-CM

## 2016-10-20 LAB — CBC
HCT: 27.3 % — ABNORMAL LOW (ref 35.0–47.0)
Hemoglobin: 8.6 g/dL — ABNORMAL LOW (ref 12.0–16.0)
MCH: 22.7 pg — AB (ref 26.0–34.0)
MCHC: 31.5 g/dL — AB (ref 32.0–36.0)
MCV: 71.9 fL — ABNORMAL LOW (ref 80.0–100.0)
PLATELETS: 50 10*3/uL — AB (ref 150–440)
RBC: 3.8 MIL/uL (ref 3.80–5.20)
RDW: 19.8 % — ABNORMAL HIGH (ref 11.5–14.5)
WBC: 8.4 10*3/uL (ref 3.6–11.0)

## 2016-10-20 LAB — BASIC METABOLIC PANEL
Anion gap: 9 (ref 5–15)
BUN: 23 mg/dL — ABNORMAL HIGH (ref 6–20)
CALCIUM: 8.5 mg/dL — AB (ref 8.9–10.3)
CO2: 23 mmol/L (ref 22–32)
CREATININE: 0.79 mg/dL (ref 0.44–1.00)
Chloride: 109 mmol/L (ref 101–111)
GLUCOSE: 110 mg/dL — AB (ref 65–99)
Potassium: 4.5 mmol/L (ref 3.5–5.1)
Sodium: 141 mmol/L (ref 135–145)

## 2016-10-20 LAB — GLUCOSE, CAPILLARY
GLUCOSE-CAPILLARY: 56 mg/dL — AB (ref 65–99)
Glucose-Capillary: 195 mg/dL — ABNORMAL HIGH (ref 65–99)
Glucose-Capillary: 173 mg/dL — ABNORMAL HIGH (ref 65–99)
Glucose-Capillary: 186 mg/dL — ABNORMAL HIGH (ref 65–99)
Glucose-Capillary: 207 mg/dL — ABNORMAL HIGH (ref 65–99)

## 2016-10-20 LAB — CULTURE, BLOOD (ROUTINE X 2): SPECIAL REQUESTS: ADEQUATE

## 2016-10-20 MED ORDER — LORAZEPAM 2 MG/ML IJ SOLN
0.5000 mg | INTRAMUSCULAR | Status: DC | PRN
  Administered 2016-10-20: 1 mg via INTRAVENOUS
  Filled 2016-10-20: qty 1

## 2016-10-20 MED ORDER — ALBUTEROL SULFATE (2.5 MG/3ML) 0.083% IN NEBU: 2.5000 mg | INHALATION_SOLUTION | RESPIRATORY_TRACT | Status: DC | PRN

## 2016-10-20 MED ORDER — DEXTROSE 50 % IV SOLN
1.0000 | Freq: Once | INTRAVENOUS | Status: AC
  Administered 2016-10-20: 50 mL via INTRAVENOUS

## 2016-10-20 MED ORDER — DEXMEDETOMIDINE HCL IN NACL 400 MCG/100ML IV SOLN
0.0000 ug/kg/h | INTRAVENOUS | Status: DC
  Administered 2016-10-20: 1 ug/kg/h via INTRAVENOUS
  Administered 2016-10-20: 1.2 ug/kg/h via INTRAVENOUS
  Administered 2016-10-20 – 2016-10-21 (×2): 1 ug/kg/h via INTRAVENOUS
  Administered 2016-10-21: 0.5 ug/kg/h via INTRAVENOUS
  Administered 2016-10-21: 0.4 ug/kg/h via INTRAVENOUS
  Administered 2016-10-21: 1 ug/kg/h via INTRAVENOUS
  Administered 2016-10-22: 0.6 ug/kg/h via INTRAVENOUS
  Filled 2016-10-20 (×9): qty 100

## 2016-10-20 MED ORDER — FUROSEMIDE 10 MG/ML IJ SOLN
40.0000 mg | Freq: Once | INTRAMUSCULAR | Status: AC
  Administered 2016-10-20: 40 mg via INTRAVENOUS
  Filled 2016-10-20: qty 4

## 2016-10-20 MED ORDER — BUDESONIDE 0.25 MG/2ML IN SUSP
0.2500 mg | Freq: Four times a day (QID) | RESPIRATORY_TRACT | Status: DC
  Administered 2016-10-20 – 2016-10-24 (×16): 0.25 mg via RESPIRATORY_TRACT
  Filled 2016-10-20 (×16): qty 2

## 2016-10-20 MED ORDER — INSULIN DETEMIR 100 UNIT/ML ~~LOC~~ SOLN
20.0000 [IU] | Freq: Every day | SUBCUTANEOUS | Status: DC
  Administered 2016-10-20 – 2016-10-23 (×4): 20 [IU] via SUBCUTANEOUS
  Filled 2016-10-20 (×5): qty 0.2

## 2016-10-20 MED ORDER — DEXTROSE 50 % IV SOLN
INTRAVENOUS | Status: AC
  Administered 2016-10-20: 50 mL via INTRAVENOUS
  Filled 2016-10-20: qty 50

## 2016-10-20 NOTE — Progress Notes (Signed)
Pt has been confused and agitated today. Pt has attempted to get out of bed on several occasions, pt has been talking to and about people who are not present and pt has been slightly verbally combative. Hinton Dyer, NP has given orders for a Precidex gtt. Will continue to monitor.

## 2016-10-20 NOTE — Progress Notes (Signed)
[name] NAME: Vanessa Oneill    MR#:  [id number]  DATE OF BIRTH:  Jun 03, 1950  SUBJECTIVE:  CHIEF COMPLAINT:   Chief Complaint  Patient presents with  . Code Sepsis   - very confused, trying to get out of bed. Off pressors  REVIEW OF SYSTEMS:  Review of Systems  Unable to perform ROS: Critical illness    DRUG ALLERGIES:   Allergies  Allergen Reactions  . Tylenol [Acetaminophen] Other (See Comments)    "tylenol 3" chest pains  . Amoxicillin Itching and Rash    VITALS:  Blood pressure (!) 191/88, pulse 98, temperature 99.2 F (37.3 C), temperature source Axillary, resp. rate (!) 30, height 5\' 7"  (1.702 m), weight 94.6 kg (208 lb 8.9 oz), SpO2 98 %.  PHYSICAL EXAMINATION:  Physical Exam  GENERAL:  66 y.o.-year-old patient lying in the bed, restless and trying to get out of bed.  EYES: Pupils equal, round, reactive to light and accommodation. No scleral icterus. Extraocular muscles intact.  HEENT: Head atraumatic, normocephalic. Oropharynx and nasopharynx clear.  NECK:  Supple, no jugular venous distention. No thyroid enlargement, no tenderness.  LUNGS: Normal breath sounds bilaterally, occasional scattered wheezing, no rales,rhonchi or crepitation. No use of accessory muscles of respiration.  CARDIOVASCULAR: S1, S2 normal. No murmurs, rubs, or gallops.  ABDOMEN: Soft, nontender,  slightly distended. Bowel sounds present. No organomegaly or mass.  EXTREMITIES: No pedal edema, cyanosis, or clubbing.  NEUROLOGIC: Cranial nerves II through XII are intact. Muscle strength 5/5 in all extremities. Sensation intact. Gait not checked.  PSYCHIATRIC: The patient is alert and oriented to self, confused and impulsive.  SKIN: No obvious rash, lesion, or ulcer.    LABORATORY PANEL:   CBC  Recent Labs Lab 10/20/16 0310  WBC 8.4  HGB 8.6*  HCT 27.3*  PLT 50*    ------------------------------------------------------------------------------------------------------------------  Chemistries   Recent Labs Lab 10/19/16 0336 10/20/16 0310  NA 135 141  K 5.2* 4.5  CL 112* 109  CO2 17* 23  GLUCOSE 273* 110*  BUN 23* 23*  CREATININE 0.99 0.79  CALCIUM 7.6* 8.5*  AST 27  --   ALT 25  --   ALKPHOS 66  --   BILITOT 1.0  --    ------------------------------------------------------------------------------------------------------------------  Cardiac Enzymes  Recent Labs Lab 10/18/16 0114  TROPONINI 0.08*   ------------------------------------------------------------------------------------------------------------------  RADIOLOGY:  US Renal  Result Date: 10/18/2016 CLINICAL DATA:  Urinary tract infection.  Sepsis and bacteremia. EXAM: RENAL / URINARY TRACT ULTRASOUND COMPLETE COMPARISON:  07/23/2016 FINDINGS: Right Kidney: Length: 12.3 cm. Echogenicity within normal limits. No mass or hydronephrosis visualized. Left Kidney: Length: 11.0 cm. Echogenicity within normal limits. No mass or hydronephrosis visualized. Bladder: Appears normal for degree of bladder distention. IMPRESSION: Normal renal sonogram. Electronically Signed   By: Kerby Moors M.D.   On: 10/18/2016 17:01   Dg Chest Port 1 View  Result Date: 10/19/2016 CLINICAL DATA:  Respiratory failure. EXAM: PORTABLE CHEST 1 VIEW COMPARISON:  10/17/2016. FINDINGS: Cardiomegaly with pulmonary venous congestion and bilateral from interstitial prominence. Bilateral pleural effusions crash that small bilateral scratched it small right pleural effusion. Findings consistent with CHF. No pneumothorax . IMPRESSION: Findings consistent congestive heart failure with bilateral pulmonary interstitial edema and small right pleural effusion. Findings are new from prior exam. Electronically Signed   By: Marcello Moores  Register   On: 10/19/2016 06:36    EKG:   Orders placed or performed during the hospital  encounter  of 10/17/16  . ED EKG 12-Lead  . ED EKG 12-Lead    ASSESSMENT AND PLAN:   66 y/o F with PMH of hypertension, COPD, ongoing smoking, diabetes, non alcoholic hepatic cirrhosis admitted for Shortness of breath, confusion and sepsis  * Sepsis-secondary to Escherichia coli bacteremia and UTI -final culture results showing ESBL positive E.coli - on meropenem  * Metabolic encephalopathy-secondary to sepsis.  - more delirious today, monitor. Ativan prn, check ammonia level due to h/o cirrhosis  * Acute respiratory failure-secondary to COPD exacerbation. Requiring BiPAP prn -Currently on 3-4 L nasal cannula. Not on home oxygen. -off steroids. Wean off oxygen as tolerated. - lasix prn for CXR congestion  * Liver cirrhosis-known history of esophageal varices requiring banding-last one at the end of July.  -Needs outpatient follow-up for repeat EGD. No hematemesis at this time. Monitor  * DM-hypoglycemic this morning- levemir dose decreased - on levemir and ssi  * Depression and anxiety- meds on hold currently due to delirium  * Tobacco use disorder- nicotine patch  * DVT Prophylaxis- TEDs and SCDs- due to anemia and chronic thrombocytopenia, not on heparin products   All the records are reviewed and case discussed with Care Management/Social Workerr. Management plans discussed with the patient, family and they are in agreement.  CODE STATUS: Full code  TOTAL TIME TAKING CARE OF THIS PATIENT: 36 minutes.   POSSIBLE D/C IN 2-3 DAYS, DEPENDING ON CLINICAL CONDITION.   Vanessa Oneill M.D on 10/20/2016 at 1:07 PM  Between 7am to 6pm - Pager - [phone]  After 6pm go to [email or web address] - password [email or web address] [facility]  Office  [phone]  CC: Primary care physician; Adin Hector, MD

## 2016-10-20 NOTE — Progress Notes (Signed)
Patient had an episode of combative agitation around 2230-2300 last night. The patient was removing bipap, trying to get out of bed, swinging at staff members. Patient was educated and reoriented. Patient became tachypnec and tachycardiac. 2mg  of ativan was given. Patient began to calm down. Patient rested well on bipap for 5 hours after dose of ativan.

## 2016-10-20 NOTE — Progress Notes (Signed)
Name: Vanessa Oneill MRN: [id number] DOB: January 31, 1950    ADMISSION DATE:  10/17/2016 CONSULTATION DATE:  10/18/16  REFERRING MD :  Dr. Bridgett Larsson  CHIEF COMPLAINT:  Fever and Chills for 2-3 days  BRIEF PATIENT DESCRIPTION:  66 year old female with Ssvere sepsis/septic shock due to UTI with Escherichia coli bacteremia   STUDIES:  10/17 RUQ [name]: no hydronephrosis or acute abnormalities  Results for orders placed or performed during the hospital encounter of 10/17/16  Blood Culture (routine x 2)     Status: Abnormal   Collection Time: 10/17/16  9:21 PM  Result Value Ref Range Status   Specimen Description BLOOD LEFT WRIST  Final   Special Requests   Final    BOTTLES DRAWN AEROBIC AND ANAEROBIC Blood Culture adequate volume   Culture  Setup Time   Final    GRAM NEGATIVE RODS IN BOTH AEROBIC AND ANAEROBIC BOTTLES CRITICAL VALUE NOTED.  VALUE IS CONSISTENT WITH PREVIOUSLY REPORTED AND CALLED VALUE. GRAM STAIN REVIEWED-AGREE WITH RESULT    Culture (A)  Final    ESCHERICHIA COLI SUSCEPTIBILITIES PERFORMED ON PREVIOUS CULTURE WITHIN THE LAST 5 DAYS. Performed at [facility], [place] [address] [place], [place] [address]    Report Status 10/20/2016 FINAL  Final  Blood Culture (routine x 2)     Status: Abnormal   Collection Time: 10/17/16  9:21 PM  Result Value Ref Range Status   Specimen Description BLOOD RAC  Final   Special Requests   Final    BOTTLES DRAWN AEROBIC AND ANAEROBIC Blood Culture adequate volume   Culture  Setup Time   Final    GRAM NEGATIVE RODS IN BOTH AEROBIC AND ANAEROBIC BOTTLES CRITICAL RESULT CALLED TO, READ BACK BY AND VERIFIED WITH:  HANK ZOMPA AT 7353 10/18/16 SDR    Culture (A)  Final    ESCHERICHIA COLI Confirmed Extended Spectrum Beta-Lactamase Producer (ESBL).  In bloodstream infections from ESBL organisms, carbapenems are preferred over piperacillin/tazobactam. They are shown to have a lower risk of mortality. Performed at [facility], [place] [address], [place], [place] [address]    Report Status 10/20/2016 FINAL  Final   Organism ID, Bacteria ESCHERICHIA COLI  Final      Susceptibility   Escherichia coli - MIC*    AMPICILLIN >=32 RESISTANT Resistant     CEFAZOLIN >=64 RESISTANT Resistant     CEFEPIME >=64 RESISTANT Resistant     CEFTAZIDIME RESISTANT Resistant     CEFTRIAXONE >=64 RESISTANT Resistant     CIPROFLOXACIN >=4 RESISTANT Resistant     GENTAMICIN <=1 SENSITIVE Sensitive     IMIPENEM <=0.25 SENSITIVE Sensitive     TRIMETH/SULFA >=320 RESISTANT Resistant     AMPICILLIN/SULBACTAM 16 INTERMEDIATE Intermediate     PIP/TAZO <=4 SENSITIVE Sensitive     Extended ESBL POSITIVE Resistant     * ESCHERICHIA COLI  Blood Culture ID Panel (Reflexed)     Status: Abnormal   Collection Time: 10/17/16  9:21 PM  Result Value Ref Range Status   Enterococcus species NOT DETECTED NOT DETECTED Final   Listeria monocytogenes NOT DETECTED NOT DETECTED Final   Staphylococcus species NOT DETECTED NOT DETECTED Final   Staphylococcus aureus NOT DETECTED NOT DETECTED Final   Streptococcus species NOT DETECTED NOT DETECTED Final   Streptococcus agalactiae NOT DETECTED NOT DETECTED Final   Streptococcus pneumoniae NOT DETECTED NOT DETECTED Final   Streptococcus pyogenes NOT DETECTED NOT DETECTED Final   Acinetobacter baumannii NOT DETECTED NOT  DETECTED Final   Enterobacteriaceae species DETECTED (A) NOT DETECTED Final    Comment: Enterobacteriaceae represent a large family of gram-negative bacteria, not a single organism. CRITICAL RESULT CALLED TO, READ BACK BY AND VERIFIED WITH:  HANK ZOMPA AT 3474 10/18/16 SDR    Enterobacter cloacae complex NOT DETECTED NOT DETECTED Final   Escherichia coli DETECTED (A) NOT DETECTED Final    Comment: CRITICAL RESULT CALLED TO, READ BACK BY AND VERIFIED WITH:  HANK ZOMPA AT 2595 10/18/16 SDR    Klebsiella oxytoca NOT DETECTED NOT DETECTED Final   Klebsiella pneumoniae NOT DETECTED NOT DETECTED  Final   Proteus species NOT DETECTED NOT DETECTED Final   Serratia marcescens NOT DETECTED NOT DETECTED Final   Carbapenem resistance NOT DETECTED NOT DETECTED Final   Haemophilus influenzae NOT DETECTED NOT DETECTED Final   Neisseria meningitidis NOT DETECTED NOT DETECTED Final   Pseudomonas aeruginosa NOT DETECTED NOT DETECTED Final   Candida albicans NOT DETECTED NOT DETECTED Final   Candida glabrata NOT DETECTED NOT DETECTED Final   Candida krusei NOT DETECTED NOT DETECTED Final   Candida parapsilosis NOT DETECTED NOT DETECTED Final   Candida tropicalis NOT DETECTED NOT DETECTED Final  Urine culture     Status: Abnormal   Collection Time: 10/17/16  9:26 PM  Result Value Ref Range Status   Specimen Description URINE, RANDOM  Final   Special Requests NONE  Final   Culture (A)  Final    >=100,000 COLONIES/mL ESCHERICHIA COLI Confirmed Extended Spectrum Beta-Lactamase Producer (ESBL).  In bloodstream infections from ESBL organisms, carbapenems are preferred over piperacillin/tazobactam. They are shown to have a lower risk of mortality. Performed at [facility], [place] [address], [place], [place] [address]    Report Status 10/20/2016 FINAL  Final   Organism ID, Bacteria ESCHERICHIA COLI (A)  Final      Susceptibility   Escherichia coli - MIC*    AMPICILLIN >=32 RESISTANT Resistant     CEFAZOLIN >=64 RESISTANT Resistant     CEFTRIAXONE >=64 RESISTANT Resistant     CIPROFLOXACIN >=4 RESISTANT Resistant     GENTAMICIN <=1 SENSITIVE Sensitive     IMIPENEM <=0.25 SENSITIVE Sensitive     NITROFURANTOIN <=16 SENSITIVE Sensitive     TRIMETH/SULFA >=320 RESISTANT Resistant     AMPICILLIN/SULBACTAM 16 INTERMEDIATE Intermediate     PIP/TAZO <=4 SENSITIVE Sensitive     Extended ESBL POSITIVE Resistant     * >=100,000 COLONIES/mL ESCHERICHIA COLI  MRSA PCR Screening     Status: None   Collection Time: 10/18/16  1:32 AM  Result Value Ref Range Status   MRSA by PCR NEGATIVE NEGATIVE  Final    Comment:        The GeneXpert MRSA Assay (FDA approved for NASAL specimens only), is one component of a comprehensive MRSA colonization surveillance program. It is not intended to diagnose MRSA infection nor to guide or monitor treatment for MRSA infections.    Anti-infectives    Start     Dose/Rate Route Frequency Ordered Stop   10/20/16 1000  levofloxacin (LEVAQUIN) IVPB 750 mg  Status:  Discontinued     750 mg 100 mL/hr over 90 Minutes Intravenous Every 48 hours 10/18/16 0535 10/18/16 1236   10/19/16 2200  meropenem (MERREM) 1 g in sodium chloride 0.9 % 100 mL IVPB     1 g 200 mL/hr over 30 Minutes Intravenous Every 8 hours 10/19/16 1639     10/19/16 1000  levofloxacin (LEVAQUIN) IVPB 750 mg  Status:  Discontinued     750 mg 100 mL/hr over 90 Minutes Intravenous Every 24 hours 10/18/16 0131 10/18/16 0535   10/18/16 1100  meropenem (MERREM) 1 g in sodium chloride 0.9 % 100 mL IVPB  Status:  Discontinued     1 g 200 mL/hr over 30 Minutes Intravenous Every 12 hours 10/18/16 1022 10/19/16 1639   10/18/16 0400  aztreonam (AZACTAM) 1 g in dextrose 5 % 50 mL IVPB  Status:  Discontinued     1 g 100 mL/hr over 30 Minutes Intravenous Every 8 hours 10/18/16 0131 10/18/16 1236   10/18/16 0145  levofloxacin (LEVAQUIN) IVPB 750 mg     750 mg 100 mL/hr over 90 Minutes Intravenous  Once 10/18/16 0131 10/18/16 0314   10/17/16 2130  piperacillin-tazobactam (ZOSYN) IVPB 3.375 g     3.375 g 100 mL/hr over 30 Minutes Intravenous  Once 10/17/16 2126 10/17/16 2209   10/17/16 2130  vancomycin (VANCOCIN) IVPB 1000 mg/200 mL premix     1,000 mg 200 mL/hr over 60 Minutes Intravenous  Once 10/17/16 2126 10/17/16 2238       SUBJECTIVE:  Agitated, confused. No respiratory distress. Poorly oriented  VITAL SIGNS: Temp:  [97.5 F (36.4 C)-99.2 F (37.3 C)] 99.2 F (37.3 C) (10/19 0800) Pulse Rate:  [78-98] 98 (10/19 0800) Resp:  [18-30] 30 (10/19 1100) BP: (126-191)/(56-111) 191/88  (10/19 1100) SpO2:  [90 %-100 %] 98 % (10/19 0800) FiO2 (%):  [40 %] 40 % (10/19 0200)  PHYSICAL EXAMINATION: General: no respiratory distress. Agitated Neuro: CNs intact, no focal deficits HEENT:  NCAT, sclerae white Cardiovascular: regular, no M Lungs:  Bibasilar crackles, no wheezes Abdomen:  Soft, NABS Ext: warm, no edema   Recent Labs Lab 10/18/16 2000 10/19/16 0336 10/20/16 0310  NA 135 135 141  K 5.5* 5.2* 4.5  CL 111 112* 109  CO2 17* 17* 23  BUN 22* 23* 23*  CREATININE 1.34* 0.99 0.79  GLUCOSE 290* 273* 110*    Recent Labs Lab 10/18/16 2000 10/19/16 0336 10/20/16 0310  HGB 8.6* 7.9* 8.6*  HCT 27.9* 25.9* 27.3*  WBC 13.3* 9.6 8.4  PLT 56* 48* 50*   CXR: 10/18: Interstitial prominence consistent with edema  ASSESSMENT / PLAN: Severe sepsis/septic shock Urinary tract infection ESBL E coli bacteremia AKI, resolved Hypervolemia due to volume resuscitation DM 2 with hyperglycemia Neutropenia, resolved Thrombocytopenia Smoker with hx of COPD Non alcoholic liver cirrhosis Episodic hypoglycemia Acute delirium  Plan Continue SDU status Continue meropenem  Monitor BMET intermittently Monitor I/Os Correct electrolytes as indicated  Decrease Levemir to once a day Continue SSI DVT px: SCDs Monitor CBC/platelets intermittently Transfuse per usual guidelines  Nicotine patch per patient's request Low dose when necessary lorazepam for agitated delirium  Merton Border, MD PCCM service Mobile [phone] Pager [phone] 10/20/2016 12:06 PM

## 2016-10-20 NOTE — Progress Notes (Signed)
Pt asleep and calm at this time.

## 2016-10-21 ENCOUNTER — Inpatient Hospital Stay: Payer: Medicare Other

## 2016-10-21 DIAGNOSIS — G9340 Encephalopathy, unspecified: Secondary | ICD-10-CM

## 2016-10-21 DIAGNOSIS — R6520 Severe sepsis without septic shock: Secondary | ICD-10-CM

## 2016-10-21 LAB — CBC
HCT: 24.7 % — ABNORMAL LOW (ref 35.0–47.0)
HEMOGLOBIN: 7.9 g/dL — AB (ref 12.0–16.0)
MCH: 22.4 pg — AB (ref 26.0–34.0)
MCHC: 32.2 g/dL (ref 32.0–36.0)
MCV: 69.7 fL — ABNORMAL LOW (ref 80.0–100.0)
PLATELETS: 42 10*3/uL — AB (ref 150–440)
RBC: 3.54 MIL/uL — AB (ref 3.80–5.20)
RDW: 19.5 % — ABNORMAL HIGH (ref 11.5–14.5)
WBC: 4.6 10*3/uL (ref 3.6–11.0)

## 2016-10-21 LAB — COMPREHENSIVE METABOLIC PANEL
ALK PHOS: 61 U/L (ref 38–126)
ALT: 16 U/L (ref 14–54)
AST: 19 U/L (ref 15–41)
Albumin: 2.7 g/dL — ABNORMAL LOW (ref 3.5–5.0)
Anion gap: 14 (ref 5–15)
BUN: 22 mg/dL — AB (ref 6–20)
CHLORIDE: 103 mmol/L (ref 101–111)
CO2: 23 mmol/L (ref 22–32)
CREATININE: 0.66 mg/dL (ref 0.44–1.00)
Calcium: 8.6 mg/dL — ABNORMAL LOW (ref 8.9–10.3)
GFR calc Af Amer: >60 mL/min (ref >60)
GFR calc non Af Amer: >60 mL/min (ref >60)
GLUCOSE: 191 mg/dL — AB (ref 65–99)
Potassium: 3.7 mmol/L (ref 3.5–5.1)
SODIUM: 140 mmol/L (ref 135–145)
Total Bilirubin: 0.9 mg/dL (ref 0.3–1.2)
Total Protein: 6.3 g/dL — ABNORMAL LOW (ref 6.5–8.1)

## 2016-10-21 LAB — GLUCOSE, CAPILLARY
GLUCOSE-CAPILLARY: 211 mg/dL — AB (ref 65–99)
GLUCOSE-CAPILLARY: 215 mg/dL — AB (ref 65–99)
Glucose-Capillary: 178 mg/dL — ABNORMAL HIGH (ref 65–99)
Glucose-Capillary: 176 mg/dL — ABNORMAL HIGH (ref 65–99)

## 2016-10-21 LAB — AMMONIA: Ammonia: 37 umol/L — ABNORMAL HIGH (ref 9–35)

## 2016-10-21 MED ORDER — MENTHOL 3 MG MT LOZG
1.0000 | LOZENGE | OROMUCOSAL | Status: DC | PRN
Start: 2016-10-21 — End: 2016-10-24
  Filled 2016-10-21: qty 9

## 2016-10-21 MED ORDER — METOPROLOL TARTRATE 5 MG/5ML IV SOLN: 2.5000 mg | INTRAVENOUS | Status: DC | PRN

## 2016-10-21 MED ORDER — LORAZEPAM 2 MG/ML IJ SOLN
0.5000 mg | INTRAMUSCULAR | Status: DC | PRN
  Administered 2016-10-21: 0.5 mg via INTRAVENOUS
  Filled 2016-10-21: qty 1

## 2016-10-21 NOTE — Progress Notes (Signed)
This RN took over care of patient around 1430 today, patient oriented to self and place, unsure of date. Precedex gtt continues at 0.5mg  for agitation.  Order to increase beyond 0.7 if needed in [facility] to keep patient calm. Patient received norco x1 for complaint of headache with improvement.  Vital signs stable, SR on cardiac monitor.  Husband at bedside for most of shift.  See [facility] for futher details.

## 2016-10-21 NOTE — Progress Notes (Signed)
   Name: Vanessa Oneill MRN: [id number] DOB: 06/10/1950    ADMISSION DATE:  10/17/2016 CONSULTATION DATE:  10/18/16  BRIEF PATIENT DESCRIPTION:  66 year old female with Ssvere sepsis/septic shock due to UTI with Escherichia coli bacteremia   STUDIES:  10/17 RUQ [name]: no hydronephrosis or acute abnormalities   MICRO: MRSA PCR 10/17 >> NEG Urine 10/16 >> ESBL E coli  Blood 10/16 >> ESBL E coli  ANTIBIOTICS:  Vanc 10/16 >> 10/16 Pip-tazo 10/16 >> 10/16 Aztreonam 10/17 >> 10/18 Levofloxacin 10/17 >> 10/18 Merropenem 10/18 >>    SUBJECTIVE:  Continues with intermittent agitation. This AM, calm on precedex. Remains poorly oriented  VITAL SIGNS: Temp:  [97.8 F (36.6 C)-99.4 F (37.4 C)] 99.4 F (37.4 C) (10/20 0701) Pulse Rate:  [30-79] 61 (10/20 0900) Resp:  [19-35] 23 (10/20 0900) BP: (96-178)/(53-79) 155/70 (10/20 0900) SpO2:  [53 %-100 %] 93 % (10/20 0900)  PHYSICAL EXAMINATION: General: Intermittently agitated, poorly oriented, NAD Neuro: CNs intact, no focal deficits HEENT:  NCAT, sclerae white Cardiovascular: regular, no M Lungs:  Bibasilar crackles, no wheezes Abdomen:  Soft, NABS Ext: warm, no edema   Recent Labs Lab 10/19/16 0336 10/20/16 0310 10/21/16 0446  NA 135 141 140  K 5.2* 4.5 3.7  CL 112* 109 103  CO2 17* 23 23  BUN 23* 23* 22*  CREATININE 0.99 0.79 0.66  GLUCOSE 273* 110* 191*    Recent Labs Lab 10/19/16 0336 10/20/16 0310 10/21/16 0446  HGB 7.9* 8.6* 7.9*  HCT 25.9* 27.3* 24.7*  WBC 9.6 8.4 4.6  PLT 48* 50* 42*   CXR: 10/20: CM, vasc cong, interstitial prominence  ASSESSMENT / PLAN: Severe sepsis/septic shock Urinary tract infection ESBL E coli bacteremia AKI, resolved Hypervolemia due to volume resuscitation DM 2 with hyperglycemia Neutropenia, resolved Thrombocytopenia Smoker with hx of COPD Non alcoholic liver cirrhosis Episodic hypoglycemia Acute encephalopathy/delirium due to acute illness  Plan Continue  SDU status Continue meropenem  Monitor BMET intermittently Monitor I/Os Correct electrolytes as indicated  Decrease Levemir to once a day Continue SSI DVT px: SCDs Monitor CBC/platelets intermittently Transfuse per usual guidelines  Cont dexmedetomidine    Merton Border, MD PCCM service Mobile [phone] Pager [phone] 10/21/2016 12:32 PM

## 2016-10-21 NOTE — Progress Notes (Signed)
Decreased to 2L.

## 2016-10-21 NOTE — Progress Notes (Signed)
  Patient found trying to get up by self with IV and SCDs and urinary drainage device.  Atempted to help back to bed pt refused. States she wants to go home. , wants to leave AMA .  Wife phoned husband with help but husband apparently told wife he will not visit. Son Chriss Czar classed and is on way and will be here in 45 mins or so . Dr Rosita Fire notified that patient wants to leave AMA and stated that the patient is not able to make this decsion at this time. Order for increase precedex as needed

## 2016-10-21 NOTE — Progress Notes (Signed)
[facility] at [place] NAME: Vanessa Oneill    MR#:  [id number]  DATE OF BIRTH:  1950-05-18  SUBJECTIVE:  CHIEF COMPLAINT:   Chief Complaint  Patient presents with  . Code Sepsis   - Patient very impulsive and trying to leave hospital yesterday, started on Precedex drip. -Much calmer today, oriented 2  REVIEW OF SYSTEMS:  Review of Systems  Unable to perform ROS: Critical illness    DRUG ALLERGIES:   Allergies  Allergen Reactions  . Tylenol [Acetaminophen] Other (See Comments)    "tylenol 3" chest pains  . Amoxicillin Itching and Rash    VITALS:  Blood pressure (!) 155/70, pulse 61, temperature 99.4 F (37.4 C), resp. rate (!) 23, height 5\' 7"  (1.702 m), weight 94.6 kg (208 lb 8.9 oz), SpO2 93 %.  PHYSICAL EXAMINATION:  Physical Exam  GENERAL:  66 y.o.-year-old patient lying in the bed, not in acute distress. EYES: Pupils equal, round, reactive to light and accommodation. No scleral icterus. Extraocular muscles intact.  HEENT: Head atraumatic, normocephalic. Oropharynx and nasopharynx clear.  NECK:  Supple, no jugular venous distention. No thyroid enlargement, no tenderness.  LUNGS: Normal breath sounds bilaterally, occasional scattered wheezing, no rales,rhonchi or crepitation. No use of accessory muscles of respiration.  CARDIOVASCULAR: S1, S2 normal. No murmurs, rubs, or gallops.  ABDOMEN: Soft, nontender,  slightly distended. Bowel sounds present. No organomegaly or mass.  EXTREMITIES: No pedal edema, cyanosis, or clubbing.  NEUROLOGIC: Cranial nerves II through XII are intact. Muscle strength 5/5 in all extremities. Sensation intact. Gait not checked.  PSYCHIATRIC: The patient is alert and oriented x 2, still some confusion noted- not oriented to time.  SKIN: No obvious rash, lesion, or ulcer.    LABORATORY PANEL:   CBC  Recent Labs Lab 10/21/16 0446  WBC 4.6  HGB 7.9*  HCT 24.7*  PLT 42*    ------------------------------------------------------------------------------------------------------------------  Chemistries   Recent Labs Lab 10/21/16 0446  NA 140  K 3.7  CL 103  CO2 23  GLUCOSE 191*  BUN 22*  CREATININE 0.66  CALCIUM 8.6*  AST 19  ALT 16  ALKPHOS 61  BILITOT 0.9   ------------------------------------------------------------------------------------------------------------------  Cardiac Enzymes  Recent Labs Lab 10/18/16 0114  TROPONINI 0.08*   ------------------------------------------------------------------------------------------------------------------  RADIOLOGY:  Dg Chest Port 1 View  Result Date: 10/21/2016 CLINICAL DATA:  Respiratory failure EXAM: PORTABLE CHEST 1 VIEW COMPARISON:  10/19/2016 FINDINGS: Shallow inspiration. Cardiac enlargement with pulmonary vascular congestion. Diffuse interstitial pattern to the lungs likely represents interstitial edema. No blunting of costophrenic angles. No pneumothorax. Calcified lymph nodes in the mediastinum. IMPRESSION: Cardiac enlargement with pulmonary vascular congestion and diffuse interstitial edema. Similar appearance to previous study. Electronically Signed   By: Lucienne Capers M.D.   On: 10/21/2016 04:26    EKG:   Orders placed or performed during the hospital encounter of 10/17/16  . ED EKG 12-Lead  . ED EKG 12-Lead    ASSESSMENT AND PLAN:   66 y/o F with PMH of hypertension, COPD, ongoing smoking, diabetes, non alcoholic hepatic cirrhosis admitted for Shortness of breath, confusion and sepsis  * Sepsis-secondary to Escherichia coli bacteremia and UTI -final culture results showing ESBL positive E.coli - on meropenem  * Acute delirium- agree with precedex drip, no h/o alcohol use - Metabolic encephalopathy-secondary to sepsis.  - Ativan prn, check ammonia level due to h/o cirrhosis  * Acute respiratory failure-secondary to COPD exacerbation. Requiring BiPAP prn -Currently  on 3-4  L nasal cannula. Not on home oxygen. -off steroids. Wean off oxygen as tolerated. - lasix prn for CXR congestion  * Liver cirrhosis-known history of esophageal varices requiring banding-last one at the end of July.  -Needs outpatient follow-up for repeat EGD. No hematemesis at this time. Monitor - Monitor hemoglobin due to known history of anemia and thrombocytopenia. No active bleeding noted at this time  * DM-hypoglycemic yesterday- levemir dose decreased-sugars have improved this morning - on levemir and ssi  * Depression and anxiety- meds on hold currently due to delirium  * Tobacco use disorder- nicotine patch  * DVT Prophylaxis- TEDs and SCDs- due to anemia and chronic thrombocytopenia, not on heparin products   All the records are reviewed and case discussed with Care Management/Social Workerr. Management plans discussed with the patient, family and they are in agreement.  CODE STATUS: Full code  TOTAL TIME TAKING CARE OF THIS PATIENT: 34 minutes.   POSSIBLE D/C IN 2-3 DAYS, DEPENDING ON CLINICAL CONDITION.   Raynelle Fujikawa M.D on 10/21/2016 at 11:43 AM  Between 7am to 6pm - Pager - [phone]  After 6pm go to [email or web address] - password [email or web address] [facility]  Office  [phone]  CC: Primary care physician; Adin Hector, MD

## 2016-10-22 LAB — CBC
HEMATOCRIT: 24.9 % — AB (ref 35.0–47.0)
HEMOGLOBIN: 7.9 g/dL — AB (ref 12.0–16.0)
MCH: 21.9 pg — AB (ref 26.0–34.0)
MCHC: 31.7 g/dL — AB (ref 32.0–36.0)
MCV: 69 fL — AB (ref 80.0–100.0)
Platelets: 47 10*3/uL — ABNORMAL LOW (ref 150–440)
RBC: 3.61 MIL/uL — AB (ref 3.80–5.20)
RDW: 19.5 % — ABNORMAL HIGH (ref 11.5–14.5)
WBC: 5.7 10*3/uL (ref 3.6–11.0)

## 2016-10-22 LAB — BASIC METABOLIC PANEL
ANION GAP: 9 (ref 5–15)
BUN: 15 mg/dL (ref 6–20)
CHLORIDE: 101 mmol/L (ref 101–111)
CO2: 25 mmol/L (ref 22–32)
Calcium: 8.1 mg/dL — ABNORMAL LOW (ref 8.9–10.3)
Creatinine, Ser: 0.65 mg/dL (ref 0.44–1.00)
GFR calc Af Amer: >60 mL/min (ref >60)
GFR calc non Af Amer: >60 mL/min (ref >60)
GLUCOSE: 107 mg/dL — AB (ref 65–99)
POTASSIUM: 3 mmol/L — AB (ref 3.5–5.1)
SODIUM: 135 mmol/L (ref 135–145)

## 2016-10-22 LAB — GLUCOSE, CAPILLARY
GLUCOSE-CAPILLARY: 174 mg/dL — AB (ref 65–99)
Glucose-Capillary: 100 mg/dL — ABNORMAL HIGH (ref 65–99)
Glucose-Capillary: 124 mg/dL — ABNORMAL HIGH (ref 65–99)
Glucose-Capillary: 156 mg/dL — ABNORMAL HIGH (ref 65–99)

## 2016-10-22 MED ORDER — POTASSIUM CHLORIDE CRYS ER 20 MEQ PO TBCR
60.0000 meq | EXTENDED_RELEASE_TABLET | Freq: Once | ORAL | Status: AC
  Administered 2016-10-22: 60 meq via ORAL
  Filled 2016-10-22: qty 3

## 2016-10-22 MED ORDER — HYDROCODONE-ACETAMINOPHEN 5-325 MG PO TABS
1.0000 | ORAL_TABLET | Freq: Once | ORAL | Status: AC
  Administered 2016-10-22: 1 via ORAL

## 2016-10-22 MED ORDER — HYDRALAZINE HCL 20 MG/ML IJ SOLN: 10.0000 mg | INTRAMUSCULAR | Status: DC | PRN

## 2016-10-22 MED ORDER — POTASSIUM CHLORIDE 20 MEQ PO PACK
60.0000 meq | PACK | Freq: Once | ORAL | Status: DC
  Filled 2016-10-22: qty 3

## 2016-10-22 MED ORDER — METOPROLOL TARTRATE 5 MG/5ML IV SOLN: 2.5000 mg | INTRAVENOUS | Status: DC | PRN

## 2016-10-22 MED ORDER — GABAPENTIN 100 MG PO CAPS
100.0000 mg | ORAL_CAPSULE | Freq: Two times a day (BID) | ORAL | Status: DC
  Administered 2016-10-22 – 2016-10-24 (×5): 100 mg via ORAL
  Filled 2016-10-22 (×5): qty 1

## 2016-10-22 MED ORDER — ESCITALOPRAM OXALATE 10 MG PO TABS
10.0000 mg | ORAL_TABLET | Freq: Every day | ORAL | Status: DC
  Administered 2016-10-23 – 2016-10-24 (×2): 10 mg via ORAL
  Filled 2016-10-22 (×3): qty 1

## 2016-10-22 NOTE — Progress Notes (Signed)
CH was asked by the secretary to make a visit with Pt in IC-6. Pt has been emotional off and on today. Pt and husband state they are in a better space. Pt pastor arrived during my visit and I yielded ministry to him. [place] will continue to monitor.    10/22/16 1500  Clinical Encounter Type  Visited With Patient;Patient and family together  Visit Type Initial;Spiritual support  Referral From Nurse  Consult/Referral To Chaplain  Spiritual Encounters  Spiritual Needs Emotional

## 2016-10-22 NOTE — Progress Notes (Signed)
Off precedex drip since 0945.  Set up in recliner chair for most of shift.  Unsteady gate.  o2 sats low 90's on RA.  A&Ox4.  Not impulsive and follows commands. Husband at bedside.

## 2016-10-22 NOTE — Progress Notes (Signed)
RN spoke with Dr. Alva Garnet and made MD aware that patient would not drink oral potassium this morning. MD gave order for 60 meq of potassium tablets.

## 2016-10-22 NOTE — Progress Notes (Signed)
[facility] [facility] [place] NAME: Vanessa Oneill    MR#:  [id number]  DATE OF BIRTH:  [birth year]  SUBJECTIVE:  CHIEF COMPLAINT:   Chief Complaint  Patient presents with  . Code Sepsis   - admitted with sepsis secondary to ESBL bacteremia and UTI. -Due to delirium has been on Precedex drip. Just turned off this morning and patient is alert and oriented  REVIEW OF SYSTEMS:  Review of Systems  Constitutional: Positive for malaise/fatigue. Negative for chills and fever.  HENT: Negative for congestion, ear discharge, hearing loss and nosebleeds.   Eyes: Negative for blurred vision and double vision.  Respiratory: Negative for cough, shortness of breath and wheezing.   Cardiovascular: Negative for chest pain and palpitations.  Gastrointestinal: Negative for abdominal pain, constipation, diarrhea, nausea and vomiting.  Genitourinary: Negative for dysuria.  Musculoskeletal: Negative for myalgias.  Neurological: Negative for dizziness, seizures and headaches.  Psychiatric/Behavioral: The patient is nervous/anxious.     DRUG ALLERGIES:   Allergies  Allergen Reactions  . Amoxicillin Itching and Rash    VITALS:  Blood pressure 100/62, pulse 65, temperature 98.1 F (36.7 C), temperature source Oral, resp. rate 19, height 5\' 7"  (1.702 m), weight 94.6 kg (208 lb 8.9 oz), SpO2 96 %.  PHYSICAL EXAMINATION:  Physical Exam  GENERAL:  66 y.o.-year-old patient lying in the bed, not in acute distress. EYES: Pupils equal, round, reactive to light and accommodation. No scleral icterus. Extraocular muscles intact.  HEENT: Head atraumatic, normocephalic. Oropharynx and nasopharynx clear.  NECK:  Supple, no jugular venous distention. No thyroid enlargement, no tenderness.  LUNGS: Normal breath sounds bilaterally, occasional scattered wheezing, no rales,rhonchi or crepitation. No use of accessory muscles of respiration.  CARDIOVASCULAR: S1, S2 normal. No  murmurs, rubs, or gallops.  ABDOMEN: Soft, nontender,  slightly distended. Bowel sounds present. No organomegaly or mass.  EXTREMITIES: No pedal edema, cyanosis, or clubbing.  NEUROLOGIC: Cranial nerves II through XII are intact. Muscle strength 5/5 in all extremities. Sensation intact. Gait not checked.  PSYCHIATRIC: The patient is alert and oriented x 3 with intermittent confusion this morning SKIN: No obvious rash, lesion, or ulcer.    LABORATORY PANEL:   CBC  Recent Labs Lab 10/22/16 0423  WBC 5.7  HGB 7.9*  HCT 24.9*  PLT 47*   ------------------------------------------------------------------------------------------------------------------  Chemistries   Recent Labs Lab 10/21/16 0446 10/22/16 0423  NA 140 135  K 3.7 3.0*  CL 103 101  CO2 23 25  GLUCOSE 191* 107*  BUN 22* 15  CREATININE 0.66 0.65  CALCIUM 8.6* 8.1*  AST 19  --   ALT 16  --   ALKPHOS 61  --   BILITOT 0.9  --    ------------------------------------------------------------------------------------------------------------------  Cardiac Enzymes  Recent Labs Lab 10/18/16 0114  TROPONINI 0.08*   ------------------------------------------------------------------------------------------------------------------  RADIOLOGY:  Dg Chest Port 1 View  Result Date: 10/21/2016 CLINICAL DATA:  Respiratory failure EXAM: PORTABLE CHEST 1 VIEW COMPARISON:  10/19/2016 FINDINGS: Shallow inspiration. Cardiac enlargement with pulmonary vascular congestion. Diffuse interstitial pattern to the lungs likely represents interstitial edema. No blunting of costophrenic angles. No pneumothorax. Calcified lymph nodes in the mediastinum. IMPRESSION: Cardiac enlargement with pulmonary vascular congestion and diffuse interstitial edema. Similar appearance to previous study. Electronically Signed   By: Lucienne Capers M.D.   On: 10/21/2016 04:26    EKG:   Orders placed or performed during the hospital encounter of  10/17/16  . ED EKG 12-Lead  . ED  EKG 12-Lead    ASSESSMENT AND PLAN:   66 y/o F with PMH of hypertension, COPD, ongoing smoking, diabetes, non alcoholic hepatic cirrhosis admitted for Shortness of breath, confusion and sepsis  * Sepsis-secondary to Escherichia coli bacteremia and UTI -final culture results showing ESBL positive E.coli - on meropenem  * Acute delirium- has been on Precedex drip with improvement now. Just turned off this morning -No history of alcohol use. -Improving mental status - Metabolic encephalopathy-secondary to sepsis.  - Ativan prn, ammonia is only slightly elevated  * Acute respiratory failure-secondary to COPD exacerbation. Requiring BiPAP prn -Currently on 3-4 L nasal cannula. Not on home oxygen. -off steroids. Wean off oxygen as tolerated.  * Liver cirrhosis-known history of esophageal varices requiring banding-last one at the end of July.  -Needs outpatient follow-up for repeat EGD. No hematemesis at this time. Monitor - Monitor hemoglobin due to known history of anemia and thrombocytopenia. No active bleeding noted at this time  * DM-hypoglycemic on adm- levemir dose decreased-sugars have improved now- on levemir and ssi  * Depression and anxiety- meds were on hold due to delirium- can be restarted once out of ICU  * Tobacco use disorder- nicotine patch  * DVT Prophylaxis- TEDs and SCDs- due to anemia and chronic thrombocytopenia, not on heparin products   All the records are reviewed and case discussed with Care Management/Social Workerr. Management plans discussed with the patient, family and they are in agreement.  CODE STATUS: Full code  TOTAL TIME TAKING CARE OF THIS PATIENT: 33 minutes.   POSSIBLE D/C IN 2-3 DAYS, DEPENDING ON CLINICAL CONDITION.   Donoven Pett M.D on 10/22/2016 at 11:16 AM  Between 7am to 6pm - Pager - [phone]  After 6pm go to [email or web address] - password [email or web address] [facility]  Office   [phone]  CC: Primary care physician; Adin Hector, MD

## 2016-10-22 NOTE — Progress Notes (Signed)
   Name: Vanessa Oneill MRN: [id number] DOB: 11/05/1950    ADMISSION DATE:  10/17/2016 CONSULTATION DATE:  10/18/16  BRIEF PATIENT DESCRIPTION:  66 year old female with Ssvere sepsis/septic shock due to UTI with Escherichia coli bacteremia   STUDIES:  10/17 RUQ [name]: no hydronephrosis or acute abnormalities   MICRO: MRSA PCR 10/17 >> NEG Urine 10/16 >> ESBL E coli  Blood 10/16 >> ESBL E coli  ANTIBIOTICS:  Vanc 10/16 >> 10/16 Pip-tazo 10/16 >> 10/16 Aztreonam 10/17 >> 10/18 Levofloxacin 10/17 >> 10/18 Merropenem 10/18 >>    SUBJECTIVE:  Less agitated but now verbally and physically aggressive towards staff.  Remains on precedex  VITAL SIGNS: Temp:  [98.1 F (36.7 C)-99.4 F (37.4 C)] 98.5 F (36.9 C) (10/21 0400) Pulse Rate:  [58-89] 76 (10/21 0600) Resp:  [17-34] 17 (10/21 0600) BP: (112-169)/(48-75) 129/60 (10/21 0600) SpO2:  [87 %-100 %] 91 % (10/21 0600)  PHYSICAL EXAMINATION: General: Intermittently agitated, poorly oriented, NAD Neuro: CNs intact, no focal deficits HEENT:  NCAT, sclerae white Cardiovascular: RRR, S1/S2, , no MRG Lungs:  Bibasilar crackles, no wheezes Abdomen:  Soft, NABS Ext: warm, no edema   Recent Labs Lab 10/20/16 0310 10/21/16 0446 10/22/16 0423  NA 141 140 135  K 4.5 3.7 3.0*  CL 109 103 101  CO2 23 23 25   BUN 23* 22* 15  CREATININE 0.79 0.66 0.65  GLUCOSE 110* 191* 107*    Recent Labs Lab 10/20/16 0310 10/21/16 0446 10/22/16 0423  HGB 8.6* 7.9* 7.9*  HCT 27.3* 24.7* 24.9*  WBC 8.4 4.6 5.7  PLT 50* 42* 47*   CXR: 10/20: CM, vasc cong, interstitial prominence  ASSESSMENT / PLAN: Severe sepsis/septic shock Urinary tract infection ESBL E coli bacteremia AKI, resolved Hypervolemia due to volume resuscitation DM 2 with hyperglycemia Neutropenia, resolved Thrombocytopenia Smoker with hx of COPD Non alcoholic liver cirrhosis Episodic hypoglycemia Acute encephalopathy/delirium due to acute  illness  Plan Continue SDU status Continue meropenem  Monitor BMET intermittently Monitor I/Os Correct electrolytes as indicated-potassium replaced  Decrease Levemir to once a day Continue SSI DVT px: SCDs Monitor CBC/platelets intermittently Transfuse per usual guidelines  Cont dexmedetomidine for agitation   Magdalene S. [facility] ANP-BC Pulmonary and [place] Pager [phone] or [phone] 10/22/2016 6:59 AM   PCCM ATTENDING ATTESTATION:  I have evaluated patient with the APP Vanessa Oneill, reviewed database in its entirety and discussed care plan in detail. In addition, this patient was discussed on multidisciplinary rounds.   She still has intermittent agitation and confusion but this is improving. Will DC precedex and work with frequent orientation. Mobilize. Continue meropenem for ESBL UTI with bacteremia. She should probably receive 7 days total. Unfortunately, options for transition to PO abx are limited. She has acute on chronic thrombocytopenia.   If she tolerates off of dex, we will consider transfer out of ICU/SDU later today    Merton Border, MD PCCM service Mobile [phone] Pager [phone] 10/22/2016 10:59 AM

## 2016-10-23 ENCOUNTER — Inpatient Hospital Stay: Payer: Medicare Other

## 2016-10-23 ENCOUNTER — Encounter: Admission: RE | Payer: Self-pay | Source: Ambulatory Visit

## 2016-10-23 ENCOUNTER — Ambulatory Visit
Admission: RE | Admit: 2016-10-23 | Payer: Medicare Other | Source: Ambulatory Visit | Admitting: Unknown Physician Specialty

## 2016-10-23 LAB — C-REACTIVE PROTEIN: CRP: 16.5 mg/dL — ABNORMAL HIGH (ref <1.0)

## 2016-10-23 LAB — BASIC METABOLIC PANEL
Anion gap: 8 (ref 5–15)
BUN: 11 mg/dL (ref 6–20)
CHLORIDE: 103 mmol/L (ref 101–111)
CO2: 26 mmol/L (ref 22–32)
CREATININE: 0.6 mg/dL (ref 0.44–1.00)
Calcium: 7.8 mg/dL — ABNORMAL LOW (ref 8.9–10.3)
GFR calc Af Amer: >60 mL/min (ref >60)
GFR calc non Af Amer: >60 mL/min (ref >60)
GLUCOSE: 178 mg/dL — AB (ref 65–99)
Potassium: 3.1 mmol/L — ABNORMAL LOW (ref 3.5–5.1)
Sodium: 137 mmol/L (ref 135–145)

## 2016-10-23 LAB — CULTURE, BLOOD (ROUTINE X 2): SPECIAL REQUESTS: ADEQUATE

## 2016-10-23 LAB — GLUCOSE, CAPILLARY
GLUCOSE-CAPILLARY: 210 mg/dL — AB (ref 65–99)
GLUCOSE-CAPILLARY: 164 mg/dL — AB (ref 65–99)
GLUCOSE-CAPILLARY: 194 mg/dL — AB (ref 65–99)
Glucose-Capillary: 195 mg/dL — ABNORMAL HIGH (ref 65–99)

## 2016-10-23 LAB — CBC
HCT: 26.2 % — ABNORMAL LOW (ref 35.0–47.0)
Hemoglobin: 8.3 g/dL — ABNORMAL LOW (ref 12.0–16.0)
MCH: 22.2 pg — AB (ref 26.0–34.0)
MCHC: 31.7 g/dL — AB (ref 32.0–36.0)
MCV: 70.1 fL — AB (ref 80.0–100.0)
PLATELETS: 68 10*3/uL — AB (ref 150–440)
RBC: 3.73 MIL/uL — AB (ref 3.80–5.20)
RDW: 20.3 % — ABNORMAL HIGH (ref 11.5–14.5)
WBC: 8.3 10*3/uL (ref 3.6–11.0)

## 2016-10-23 LAB — URINE CULTURE: Culture: >=100000 — AB

## 2016-10-23 SURGERY — EGD (ESOPHAGOGASTRODUODENOSCOPY)
Anesthesia: General

## 2016-10-23 MED ORDER — ADULT MULTIVITAMIN W/MINERALS CH
1.0000 | ORAL_TABLET | Freq: Every day | ORAL | Status: DC
  Administered 2016-10-24: 1 via ORAL
  Filled 2016-10-23: qty 1

## 2016-10-23 MED ORDER — POTASSIUM CHLORIDE CRYS ER 20 MEQ PO TBCR
40.0000 meq | EXTENDED_RELEASE_TABLET | Freq: Once | ORAL | Status: AC
  Administered 2016-10-23: 40 meq via ORAL
  Filled 2016-10-23: qty 2

## 2016-10-23 MED ORDER — CYCLOBENZAPRINE HCL 10 MG PO TABS
5.0000 mg | ORAL_TABLET | Freq: Three times a day (TID) | ORAL | Status: DC
  Administered 2016-10-23 – 2016-10-24 (×4): 5 mg via ORAL
  Filled 2016-10-23 (×3): qty 1

## 2016-10-23 MED ORDER — BUPROPION HCL 75 MG PO TABS
150.0000 mg | ORAL_TABLET | Freq: Two times a day (BID) | ORAL | Status: DC
  Filled 2016-10-23 (×4): qty 2

## 2016-10-23 MED ORDER — TRAZODONE HCL 50 MG PO TABS: 50.0000 mg | ORAL_TABLET | Freq: Every evening | ORAL | Status: DC | PRN

## 2016-10-23 MED ORDER — ENSURE ENLIVE PO LIQD
237.0000 mL | Freq: Two times a day (BID) | ORAL | Status: DC
  Administered 2016-10-24 (×2): 237 mL via ORAL

## 2016-10-23 NOTE — Consult Note (Signed)
[facility] Infectious Disease     Reason for Consult: ESBL E coli bacteremia Referring Physician: Claria Dice Date of Admission:  10/17/2016   Active Problems:   Sepsis ([place])   Septic shock ([place])   Urinary tract infection without hematuria   Respiratory distress   HPI: Vanessa Oneill is a 66 y.o. female admitted with fevers and chills and found to have temp 106, wbc 1.3 ->13, sepsis, UA with TNTC WBC> Had BCx and UCX with E coli,. Initially in ICU but now on floor.   She continues to complain of back pain, midline, T and L spine area. She did get oob today per her report.  She has a hx of uncontrolled diabetes, hyperlipidemia, cirrhosis with thrombocytopenia, hypertension, esophageal varices, chronic back pain, persistent headache, ongoing tobacco abuse.    Past Medical History:  Diagnosis Date  . COPD (chronic obstructive pulmonary disease) ([place])   . Diabetes mellitus without complication ([place])   . Hypertension   . 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 Personal history of tobacco use, presenting hazards to health 11/12/2014   Past Surgical History:  Procedure Laterality Date  . ABDOMINAL HYSTERECTOMY    . BREAST BIOPSY Right    neg  . CHOLECYSTECTOMY    . ESOPHAGOGASTRODUODENOSCOPY (EGD) WITH PROPOFOL N/A 07/24/2016   Procedure: ESOPHAGOGASTRODUODENOSCOPY (EGD) WITH PROPOFOL;  Surgeon: Jonathon Bellows, MD;  Location: [facility] ENDOSCOPY;  Service: Endoscopy;  Laterality: N/A;   Social History  Substance Use Topics  . Smoking status: Current Every Day Smoker    Packs/day: 1.00    Years: 48.00    Types: Cigarettes  . Smokeless tobacco: Never Used  . Alcohol use No   Family History  Problem Relation Age of Onset  . Lung cancer Father     Allergies:  Allergies  Allergen Reactions  . Amoxicillin Itching and Rash    Current antibiotics: Antibiotics Given (last 72 hours)    Date/Time Action Medication Dose Rate   10/20/16 2234 New Bag/Given   meropenem (MERREM) 1 g  in sodium chloride 0.9 % 100 mL IVPB 1 g 200 mL/hr   10/21/16 0600 New Bag/Given   meropenem (MERREM) 1 g in sodium chloride 0.9 % 100 mL IVPB 1 g 200 mL/hr   10/21/16 1343 New Bag/Given   meropenem (MERREM) 1 g in sodium chloride 0.9 % 100 mL IVPB 1 g 200 mL/hr   10/21/16 2124 New Bag/Given   meropenem (MERREM) 1 g in sodium chloride 0.9 % 100 mL IVPB 1 g 200 mL/hr   10/22/16 0556 New Bag/Given   meropenem (MERREM) 1 g in sodium chloride 0.9 % 100 mL IVPB 1 g 200 mL/hr   10/22/16 1446 New Bag/Given   meropenem (MERREM) 1 g in sodium chloride 0.9 % 100 mL IVPB 1 g 200 mL/hr   10/22/16 2208 New Bag/Given   meropenem (MERREM) 1 g in sodium chloride 0.9 % 100 mL IVPB 1 g 200 mL/hr   10/23/16 9417 New Bag/Given   meropenem (MERREM) 1 g in sodium chloride 0.9 % 100 mL IVPB 1 g 200 mL/hr   10/23/16 1412 New Bag/Given   meropenem (MERREM) 1 g in sodium chloride 0.9 % 100 mL IVPB 1 g 200 mL/hr      MEDICATIONS: . aspirin EC  81 mg Oral Daily  . budesonide (PULMICORT) nebulizer solution  0.25 mg Nebulization Q6H  . buPROPion  150 mg Oral BID  . cyclobenzaprine  5 mg Oral TID  . escitalopram  10 mg  Oral Daily  . feeding supplement (ENSURE ENLIVE)  237 mL Oral BID BM  . gabapentin  100 mg Oral BID  . insulin aspart  0-15 Units Subcutaneous TID WC  . insulin aspart  0-5 Units Subcutaneous QHS  . insulin detemir  20 Units Subcutaneous QHS  . ipratropium-albuterol  3 mL Nebulization Q6H  . multivitamin with minerals  1 tablet Oral Daily  . pantoprazole  40 mg Oral BID  . pneumococcal 23 valent vaccine  0.5 mL Intramuscular Tomorrow-1000  . rOPINIRole  1 mg Oral QHS    Review of Systems - 11 systems reviewed and negative per HPI   OBJECTIVE: Temp:  [97.6 F (36.4 C)-99.2 F (37.3 C)] 98.3 F (36.8 C) (10/22 1249) Pulse Rate:  [93-110] 93 (10/22 1249) Resp:  [16-26] 20 (10/22 1249) BP: (112-149)/(56-77) 134/74 (10/22 1249) SpO2:  [82 %-97 %] 96 % (10/22 1249) Physical Exam   Constitutional:  oriented to person, place, and time.obese, ill appearing. HENT: [place]/AT, PERRLA, no scleral icterus Mouth/Throat: Oropharynx is clear and moist. No oropharyngeal exudate.  Cardiovascular: Normal rate, regular rhythm and normal heart sounds.  Pulmonary/Chest: Effort normal and breath sounds normal. No respiratory distress.  has no wheezes.  Neck = supple, no nuchal rigidity Abdominal: Soft. Bowel sounds are normal.  exhibits no distension. There is no tenderness.  Lymphadenopathy: no cervical adenopathy. No axillary adenopathy Neurological: alert and oriented to person, place, and time.  Skin: Skin is warm and dry. No rash noted. No erythema. + ttp over T and L spine  Psychiatric: a normal mood and affect.  behavior is normal.    LABS: Results for orders placed or performed during the hospital encounter of 10/17/16 (from the past 48 hour(s))  Glucose, capillary     Status: Abnormal   Collection Time: 10/21/16  5:17 PM  Result Value Ref Range   Glucose-Capillary 215 (H) 65 - 99 mg/dL  Glucose, capillary     Status: Abnormal   Collection Time: 10/21/16  9:14 PM  Result Value Ref Range   Glucose-Capillary 176 (H) 65 - 99 mg/dL  Basic metabolic panel     Status: Abnormal   Collection Time: 10/22/16  4:23 AM  Result Value Ref Range   Sodium 135 135 - 145 mmol/L   Potassium 3.0 (L) 3.5 - 5.1 mmol/L   Chloride 101 101 - 111 mmol/L   CO2 25 22 - 32 mmol/L   Glucose, Bld 107 (H) 65 - 99 mg/dL   BUN 15 6 - 20 mg/dL   Creatinine, Ser 0.65 0.44 - 1.00 mg/dL   Calcium 8.1 (L) 8.9 - 10.3 mg/dL   GFR calc non Af Amer >60 >60 mL/min   GFR calc Af Amer >60 >60 mL/min    Comment: (NOTE) The eGFR has been calculated using the CKD EPI equation. This calculation has not been validated in all clinical situations. eGFR's persistently <60 mL/min signify possible Chronic Kidney Disease.    Anion gap 9 5 - 15  CBC     Status: Abnormal   Collection Time: 10/22/16  4:23 AM  Result  Value Ref Range   WBC 5.7 3.6 - 11.0 K/uL   RBC 3.61 (L) 3.80 - 5.20 MIL/uL   Hemoglobin 7.9 (L) 12.0 - 16.0 g/dL   HCT 24.9 (L) 35.0 - 47.0 %   MCV 69.0 (L) 80.0 - 100.0 fL   MCH 21.9 (L) 26.0 - 34.0 pg   MCHC 31.7 (L) 32.0 - 36.0 g/dL   RDW  19.5 (H) 11.5 - 14.5 %   Platelets 47 (L) 150 - 440 K/uL    Comment: PLATELET COUNT CONFIRMED BY SMEAR  Glucose, capillary     Status: Abnormal   Collection Time: 10/22/16  8:00 AM  Result Value Ref Range   Glucose-Capillary 100 (H) 65 - 99 mg/dL  Glucose, capillary     Status: Abnormal   Collection Time: 10/22/16 12:07 PM  Result Value Ref Range   Glucose-Capillary 124 (H) 65 - 99 mg/dL  Glucose, capillary     Status: Abnormal   Collection Time: 10/22/16  4:53 PM  Result Value Ref Range   Glucose-Capillary 156 (H) 65 - 99 mg/dL  Glucose, capillary     Status: Abnormal   Collection Time: 10/22/16  9:28 PM  Result Value Ref Range   Glucose-Capillary 174 (H) 65 - 99 mg/dL  Basic metabolic panel     Status: Abnormal   Collection Time: 10/23/16  3:29 AM  Result Value Ref Range   Sodium 137 135 - 145 mmol/L   Potassium 3.1 (L) 3.5 - 5.1 mmol/L   Chloride 103 101 - 111 mmol/L   CO2 26 22 - 32 mmol/L   Glucose, Bld 178 (H) 65 - 99 mg/dL   BUN 11 6 - 20 mg/dL   Creatinine, Ser 0.60 0.44 - 1.00 mg/dL   Calcium 7.8 (L) 8.9 - 10.3 mg/dL   GFR calc non Af Amer >60 >60 mL/min   GFR calc Af Amer >60 >60 mL/min    Comment: (NOTE) The eGFR has been calculated using the CKD EPI equation. This calculation has not been validated in all clinical situations. eGFR's persistently <60 mL/min signify possible Chronic Kidney Disease.    Anion gap 8 5 - 15  CBC     Status: Abnormal   Collection Time: 10/23/16  3:29 AM  Result Value Ref Range   WBC 8.3 3.6 - 11.0 K/uL   RBC 3.73 (L) 3.80 - 5.20 MIL/uL   Hemoglobin 8.3 (L) 12.0 - 16.0 g/dL   HCT 26.2 (L) 35.0 - 47.0 %   MCV 70.1 (L) 80.0 - 100.0 fL   MCH 22.2 (L) 26.0 - 34.0 pg   MCHC 31.7 (L) 32.0 -  36.0 g/dL   RDW 20.3 (H) 11.5 - 14.5 %   Platelets 68 (L) 150 - 440 K/uL  Glucose, capillary     Status: Abnormal   Collection Time: 10/23/16  7:43 AM  Result Value Ref Range   Glucose-Capillary 195 (H) 65 - 99 mg/dL   Comment 1 Notify RN   Glucose, capillary     Status: Abnormal   Collection Time: 10/23/16 11:43 AM  Result Value Ref Range   Glucose-Capillary 210 (H) 65 - 99 mg/dL   Comment 1 Notify RN    No components found for: ESR, C REACTIVE PROTEIN MICRO: Recent Results (from the past 720 hour(s))  Blood Culture (routine x 2)     Status: Abnormal   Collection Time: 10/17/16  9:21 PM  Result Value Ref Range Status   Specimen Description BLOOD LEFT WRIST  Final   Special Requests   Final    BOTTLES DRAWN AEROBIC AND ANAEROBIC Blood Culture adequate volume   Culture  Setup Time   Final    GRAM NEGATIVE RODS IN BOTH AEROBIC AND ANAEROBIC BOTTLES CRITICAL VALUE NOTED.  VALUE IS CONSISTENT WITH PREVIOUSLY REPORTED AND CALLED VALUE. GRAM STAIN REVIEWED-AGREE WITH RESULT    Culture (A)  Final    ESCHERICHIA  COLI SUSCEPTIBILITIES PERFORMED ON PREVIOUS CULTURE WITHIN THE LAST 5 DAYS. Performed at [facility], [place] [address], [place], [place] [address]    Report Status 10/20/2016 FINAL  Final  Blood Culture (routine x 2)     Status: Abnormal   Collection Time: 10/17/16  9:21 PM  Result Value Ref Range Status   Specimen Description BLOOD RAC  Final   Special Requests   Final    BOTTLES DRAWN AEROBIC AND ANAEROBIC Blood Culture adequate volume   Culture  Setup Time   Final    GRAM NEGATIVE RODS IN BOTH AEROBIC AND ANAEROBIC BOTTLES CRITICAL RESULT CALLED TO, READ BACK BY AND VERIFIED WITH:  HANK ZOMPA AT 7017 10/18/16 SDR    Culture (A)  Final    ESCHERICHIA COLI Confirmed Extended Spectrum Beta-Lactamase Producer (ESBL).  In bloodstream infections from ESBL organisms, carbapenems are preferred over piperacillin/tazobactam. They are shown to have a lower risk of  mortality. Performed at [facility], [place] [address], [place], [place] [address]    Report Status 10/23/2016 FINAL  Final   Organism ID, Bacteria ESCHERICHIA COLI  Final      Susceptibility   Escherichia coli - MIC*    AMPICILLIN >=32 RESISTANT Resistant     CEFAZOLIN >=64 RESISTANT Resistant     CEFEPIME >=64 RESISTANT Resistant     CEFTAZIDIME RESISTANT Resistant     CEFTRIAXONE >=64 RESISTANT Resistant     CIPROFLOXACIN >=4 RESISTANT Resistant     GENTAMICIN <=1 SENSITIVE Sensitive     IMIPENEM <=0.25 SENSITIVE Sensitive     TRIMETH/SULFA >=320 RESISTANT Resistant     AMPICILLIN/SULBACTAM 16 INTERMEDIATE Intermediate     PIP/TAZO <=4 SENSITIVE Sensitive     Extended ESBL POSITIVE Resistant     ERTAPENEM Value in next row Sensitive      SENSITIVE<=0.5    * ESCHERICHIA COLI  Blood Culture ID Panel (Reflexed)     Status: Abnormal   Collection Time: 10/17/16  9:21 PM  Result Value Ref Range Status   Enterococcus species NOT DETECTED NOT DETECTED Final   Listeria monocytogenes NOT DETECTED NOT DETECTED Final   Staphylococcus species NOT DETECTED NOT DETECTED Final   Staphylococcus aureus NOT DETECTED NOT DETECTED Final   Streptococcus species NOT DETECTED NOT DETECTED Final   Streptococcus agalactiae NOT DETECTED NOT DETECTED Final   Streptococcus pneumoniae NOT DETECTED NOT DETECTED Final   Streptococcus pyogenes NOT DETECTED NOT DETECTED Final   Acinetobacter baumannii NOT DETECTED NOT DETECTED Final   Enterobacteriaceae species DETECTED (A) NOT DETECTED Final    Comment: Enterobacteriaceae represent a large family of gram-negative bacteria, not a single organism. CRITICAL RESULT CALLED TO, READ BACK BY AND VERIFIED WITH:  HANK ZOMPA AT 3009 10/18/16 SDR    Enterobacter cloacae complex NOT DETECTED NOT DETECTED Final   Escherichia coli DETECTED (A) NOT DETECTED Final    Comment: CRITICAL RESULT CALLED TO, READ BACK BY AND VERIFIED WITH:  HANK ZOMPA AT 2330 10/18/16  SDR    Klebsiella oxytoca NOT DETECTED NOT DETECTED Final   Klebsiella pneumoniae NOT DETECTED NOT DETECTED Final   Proteus species NOT DETECTED NOT DETECTED Final   Serratia marcescens NOT DETECTED NOT DETECTED Final   Carbapenem resistance NOT DETECTED NOT DETECTED Final   Haemophilus influenzae NOT DETECTED NOT DETECTED Final   Neisseria meningitidis NOT DETECTED NOT DETECTED Final   Pseudomonas aeruginosa NOT DETECTED NOT DETECTED Final   Candida albicans NOT DETECTED NOT DETECTED Final   Candida  glabrata NOT DETECTED NOT DETECTED Final   Candida krusei NOT DETECTED NOT DETECTED Final   Candida parapsilosis NOT DETECTED NOT DETECTED Final   Candida tropicalis NOT DETECTED NOT DETECTED Final  Urine culture     Status: Abnormal   Collection Time: 10/17/16  9:26 PM  Result Value Ref Range Status   Specimen Description URINE, RANDOM  Final   Special Requests NONE  Final   Culture (A)  Final    >=100,000 COLONIES/mL ESCHERICHIA COLI Confirmed Extended Spectrum Beta-Lactamase Producer (ESBL).  In bloodstream infections from ESBL organisms, carbapenems are preferred over piperacillin/tazobactam. They are shown to have a lower risk of mortality. Performed at [facility], [place] [address], [place], [place] [address]    Report Status 10/23/2016 FINAL  Final   Organism ID, Bacteria ESCHERICHIA COLI (A)  Final      Susceptibility   Escherichia coli - MIC*    AMPICILLIN >=32 RESISTANT Resistant     CEFAZOLIN >=64 RESISTANT Resistant     CEFTRIAXONE >=64 RESISTANT Resistant     CIPROFLOXACIN >=4 RESISTANT Resistant     GENTAMICIN <=1 SENSITIVE Sensitive     IMIPENEM <=0.25 SENSITIVE Sensitive     NITROFURANTOIN <=16 SENSITIVE Sensitive     TRIMETH/SULFA >=320 RESISTANT Resistant     AMPICILLIN/SULBACTAM 16 INTERMEDIATE Intermediate     PIP/TAZO <=4 SENSITIVE Sensitive     Extended ESBL POSITIVE Resistant     ERTAPENEM Value in next row Sensitive      SENSITIVE<=0.5    * >=100,000  COLONIES/mL ESCHERICHIA COLI  MRSA PCR Screening     Status: None   Collection Time: 10/18/16  1:32 AM  Result Value Ref Range Status   MRSA by PCR NEGATIVE NEGATIVE Final    Comment:        The GeneXpert MRSA Assay (FDA approved for NASAL specimens only), is one component of a comprehensive MRSA colonization surveillance program. It is not intended to diagnose MRSA infection nor to guide or monitor treatment for MRSA infections.     IMAGING: Dg Chest 2 View  Result Date: 10/23/2016 CLINICAL DATA:  Shortness of Breath EXAM: CHEST  2 VIEW COMPARISON:  October 21, 2016 FINDINGS: There is considerably less interstitial edema compared to recent study. Trace edema remains. There is a small right pleural effusion. There is no airspace consolidation. Heart is mildly enlarged with mild pulmonary venous hypertension. No adenopathy. No bone lesions. IMPRESSION: Considerably less pulmonary edema compared to recent study. Slight amount of edema remains. No consolidation. Small pleural effusion on the right noted. There is stable pulmonary vascular congestion with cardiomegaly and pulmonary venous hypertension. Electronically Signed   By: Lowella Grip III M.D.   On: 10/23/2016 10:58   US Renal  Result Date: 10/18/2016 CLINICAL DATA:  Urinary tract infection.  Sepsis and bacteremia. EXAM: RENAL / URINARY TRACT ULTRASOUND COMPLETE COMPARISON:  07/23/2016 FINDINGS: Right Kidney: Length: 12.3 cm. Echogenicity within normal limits. No mass or hydronephrosis visualized. Left Kidney: Length: 11.0 cm. Echogenicity within normal limits. No mass or hydronephrosis visualized. Bladder: Appears normal for degree of bladder distention. IMPRESSION: Normal renal sonogram. Electronically Signed   By: Kerby Moors M.D.   On: 10/18/2016 17:01   Dg Chest Port 1 View  Result Date: 10/21/2016 CLINICAL DATA:  Respiratory failure EXAM: PORTABLE CHEST 1 VIEW COMPARISON:  10/19/2016 FINDINGS: Shallow inspiration.  Cardiac enlargement with pulmonary vascular congestion. Diffuse interstitial pattern to the lungs likely represents interstitial edema. No blunting of costophrenic angles. No  pneumothorax. Calcified lymph nodes in the mediastinum. IMPRESSION: Cardiac enlargement with pulmonary vascular congestion and diffuse interstitial edema. Similar appearance to previous study. Electronically Signed   By: Lucienne Capers M.D.   On: 10/21/2016 04:26   Dg Chest Port 1 View  Result Date: 10/19/2016 CLINICAL DATA:  Respiratory failure. EXAM: PORTABLE CHEST 1 VIEW COMPARISON:  10/17/2016. FINDINGS: Cardiomegaly with pulmonary venous congestion and bilateral from interstitial prominence. Bilateral pleural effusions crash that small bilateral scratched it small right pleural effusion. Findings consistent with CHF. No pneumothorax . IMPRESSION: Findings consistent congestive heart failure with bilateral pulmonary interstitial edema and small right pleural effusion. Findings are new from prior exam. Electronically Signed   By: [place]   On: 10/19/2016 06:36   Dg Chest Port 1 View  Result Date: 10/17/2016 CLINICAL DATA:  Dyspnea starting this morning and chills last evening. EXAM: PORTABLE CHEST 1 VIEW COMPARISON:  02/22/2016 CT, CXR report 03/27/2016 FINDINGS: The cardiac silhouette appears enlarged but some of this is likely due to the portable technique. Mild central vascular congestion. No pneumonic consolidation, effusion or pneumothorax. Minimal aortic atherosclerosis. No acute nor suspicious osseous lesions. IMPRESSION: 1. Borderline cardiomegaly with mild vascular congestion. 2. Minimal aortic atherosclerosis. Electronically Signed   By: Ashley Royalty M.D.   On: 10/17/2016 21:41    Assessment:   Vanessa Oneill is a 66 y.o. female with ESBL UTI and bacteremia. Has been on appropriate treatment since 10/17 and clinically improving. Day 6 of treatment. She remains weak and fatigued. She complains of back  pain but [name] neg for pyelonephritis but I think she likely does have pyelo. I think discitis is unlikely but will need to monitor for it.  Discussed with patient and family will need picc and min 14 days total abx with ertapenem.  Recommendations Place picc CHeck esr crp Ertapenem x 14 days- see abx order sheet Thank you very much for allowing me to participate in the care of this patient. Please call with questions.   Cheral Marker. Ola Spurr, MD

## 2016-10-23 NOTE — Progress Notes (Signed)
Physical Therapy Evaluation Patient Details Name: Vanessa Oneill MRN: [id number] DOB: 05-18-1950 Today's Date: 10/23/2016   History of Present Illness  Pt admitted for sepsis due to UTI, had complaints of fever, chills, nausea and SOB. PMH of HTN, diabetes, COPD, non-alcohol liver cirrhosis, had EGD in July 2018. Pt currently on oxygen due to acute respiratory failure due to COPD exacerbation.  Clinical Impression  Pt is a pleasant 66 year old female admitted for sepsis. Pt performed bed mobility with mod. I, transfers with RW with CGA, amb with RW with min assist. Pt amb a total of 80 ft, takes short steps, amb at steady slow pace. Pt reports no assist device with amb at baseline, however pt appeared to rely on RW for UE support, recommend use of RW for mobility. Pt appeared fatigued throughout session, however able to participate in all PT activities. Pt demonstrates deficits with strength and endurance with mobility.  Would benefit from further skilled PT services to promote optimal return to home. Recommend transition to [facility] upon DC from acute hospitalization.     Follow Up Recommendations Home health PT    Equipment Recommendations  Rolling walker with 5" wheels    Recommendations for Other Services       Precautions / Restrictions Precautions Precautions: Fall Restrictions Weight Bearing Restrictions: No      Mobility  Bed Mobility Overal bed mobility: Modified Independent             General bed mobility comments: Utilized bed rails, no cues or assist needed  Transfers Overall transfer level: Needs assistance Equipment used: Rolling walker (2 wheeled) Transfers: Sit to/from Stand Sit to Stand: Min guard         General transfer comment: Relies on RW for UE support to push up to rise  Ambulation/Gait Ambulation/Gait assistance: Min assist Ambulation Distance (Feet): 80 Feet Assistive device: Rolling walker (2 wheeled) Gait Pattern/deviations:  Step-through pattern     General Gait Details: Reciprocal alternating gait, short steps and slow amb, no dizziness or SOB. Cues for proper navigation of RW around furniture  Stairs            Wheelchair Mobility    Modified Rankin (Stroke Patients Only)       Balance Overall balance assessment: Needs assistance Sitting-balance support: Feet supported Sitting balance-Leahy Scale: Good Sitting balance - Comments: No assist   Standing balance support: Bilateral upper extremity supported Standing balance-Leahy Scale: Good Standing balance comment: Relies on RW for UE suppport, able to stand for several minutes to measure O2 sats                             Pertinent Vitals/Pain Pain Assessment: 0-10 Pain Score: 10-Worst pain ever Pain Location: low back Pain Descriptors / Indicators: Aching Pain Intervention(s): Limited activity within patient's tolerance;Monitored during session;Repositioned;Patient requesting pain meds-RN notified    Home Living Family/patient expects to be discharged to:: Private residence Living Arrangements: Spouse/significant other Available Help at Discharge: Family Type of Home: House Home Access: Stairs to enter Entrance Stairs-Rails: Psychiatric nurse of Steps: 6-8 (front entrance) 5 (garage entrance) Home Layout: Two level;Able to live on main level with bedroom/bathroom (Has acorn lift to get into 2nd floor if needed) Home Equipment: Other (comment) (Acorn lift to get into 2nd floor of home)      Prior Function Level of Independence: Independent         Comments: Pt reports being  independent with all ADLs, continues to work      Journalist, newspaper        Extremity/Trunk Assessment   Upper Extremity Assessment Upper Extremity Assessment: Generalized weakness RUE Deficits / Details: UE MMT grossly 4-/5     Lower Extremity Assessment Lower Extremity Assessment: Generalized weakness (LE MMT grossly  4-/5)    Cervical / Trunk Assessment Cervical / Trunk Assessment: Normal  Communication   Communication: No difficulties  Cognition Arousal/Alertness: Awake/alert Behavior During Therapy: WFL for tasks assessed/performed Overall Cognitive Status: Within Functional Limits for tasks assessed                                        General Comments      Exercises Other Exercises Other Exercises: Pt amb without RW for 20 feet, furniture walker, slow amb with short steps, recommend use of RW   Assessment/Plan    PT Assessment Patient needs continued PT services  PT Problem List Decreased strength;Decreased range of motion;Decreased activity tolerance;Decreased balance;Decreased mobility;Decreased knowledge of use of DME;Pain       PT Treatment Interventions DME instruction;Gait training;Stair training;Functional mobility training;Therapeutic activities;Therapeutic exercise;Balance training;Patient/family education    PT Goals (Current goals can be found in the Care Plan section)  Acute Rehab PT Goals Patient Stated Goal: to get stronger  PT Goal Formulation: With patient Time For Goal Achievement: 11/06/16 Potential to Achieve Goals: Good    Frequency Min 2X/week   Barriers to discharge        Co-evaluation               AM-PAC PT "6 Clicks" Daily Activity  Outcome Measure Difficulty turning over in bed (including adjusting bedclothes, sheets and blankets)?: None Difficulty moving from lying on back to sitting on the side of the bed? : None Difficulty sitting down on and standing up from a chair with arms (e.g., wheelchair, bedside commode, etc,.)?: Unable Help needed moving to and from a bed to chair (including a wheelchair)?: None Help needed walking in hospital room?: A Little Help needed climbing 3-5 steps with a railing? : A Lot 6 Click Score: 18    End of Session Equipment Utilized During Treatment: Gait belt Activity Tolerance: Patient  tolerated treatment well Patient left: in chair;with call bell/phone within reach;with chair alarm set;with family/visitor present Nurse Communication: Mobility status;Other (comment) (O2 status) PT Visit Diagnosis: Other abnormalities of gait and mobility (R26.89);Muscle weakness (generalized) (M62.81);Pain Pain - part of body:  (low back)    Time: 6010-9323 PT Time Calculation (min) (ACUTE ONLY): 44 min   Charges:         PT G Codes:   PT G-Codes **NOT FOR INPATIENT CLASS** Functional Assessment Tool Used: AM-PAC 6 Clicks Basic Mobility Functional Limitation: Mobility: Walking and moving around Mobility: Walking and Moving Around Current Status (F5732): At least 40 percent but less than 60 percent impaired, limited or restricted Mobility: Walking and Moving Around Goal Status [phone]): At least 20 percent but less than 40 percent impaired, limited or restricted    Manfred Arch, SPT  Manfred Arch 10/23/2016, 11:24 AM

## 2016-10-23 NOTE — Progress Notes (Addendum)
[facility] at [place] NAME: Vanessa Oneill    MR#:  [id number]  DATE OF BIRTH:  [birth year]  SUBJECTIVE:  CHIEF COMPLAINT:   Chief Complaint  Patient presents with  . Code Sepsis   - admitted with sepsis secondary to ESBL bacteremia and UTI. -very pleasant and cooperative this morning. Remains on 2 L oxygen. Ambulating with physical therapy in the hallway  REVIEW OF SYSTEMS:  Review of Systems  Constitutional: Positive for malaise/fatigue. Negative for chills and fever.  HENT: Negative for congestion, ear discharge, hearing loss and nosebleeds.   Eyes: Negative for blurred vision and double vision.  Respiratory: Negative for cough, shortness of breath and wheezing.   Cardiovascular: Negative for chest pain and palpitations.  Gastrointestinal: Negative for abdominal pain, constipation, diarrhea, nausea and vomiting.  Genitourinary: Negative for dysuria.  Musculoskeletal: Positive for back pain. Negative for myalgias.  Neurological: Negative for dizziness, seizures and headaches.  Psychiatric/Behavioral: The patient is not nervous/anxious.     DRUG ALLERGIES:   Allergies  Allergen Reactions  . Amoxicillin Itching and Rash    VITALS:  Blood pressure (!) 112/56, pulse 99, temperature 98.2 F (36.8 C), temperature source Oral, resp. rate 20, height 5\' 7"  (1.702 m), weight 94.6 kg (208 lb 8.9 oz), SpO2 92 %.  PHYSICAL EXAMINATION:  Physical Exam  GENERAL:  66 y.o.-year-old patient lying in the bed, not in acute distress. EYES: Pupils equal, round, reactive to light and accommodation. No scleral icterus. Extraocular muscles intact.  HEENT: Head atraumatic, normocephalic. Oropharynx and nasopharynx clear.  NECK:  Supple, no jugular venous distention. No thyroid enlargement, no tenderness.  LUNGS: Normal breath sounds bilaterally, no wheezing, no rales,rhonchi or crepitation. No use of accessory muscles of respiration. Slight crackles  at the bases. CARDIOVASCULAR: S1, S2 normal. No murmurs, rubs, or gallops.  ABDOMEN: Soft, nontender,  slightly distended. Bowel sounds present. No organomegaly or mass.  EXTREMITIES: No pedal edema, cyanosis, or clubbing.  NEUROLOGIC: Cranial nerves II through XII are intact. Muscle strength 5/5 in all extremities. Sensation intact. Unsteady gait due to weakness noted this morning.  PSYCHIATRIC: The patient is alert and oriented x 3  SKIN: No obvious rash, lesion, or ulcer.    LABORATORY PANEL:   CBC  Recent Labs Lab 10/23/16 0329  WBC 8.3  HGB 8.3*  HCT 26.2*  PLT 68*   ------------------------------------------------------------------------------------------------------------------  Chemistries   Recent Labs Lab 10/21/16 0446  10/23/16 0329  NA 140  < > 137  K 3.7  < > 3.1*  CL 103  < > 103  CO2 23  < > 26  GLUCOSE 191*  < > 178*  BUN 22*  < > 11  CREATININE 0.66  < > 0.60  CALCIUM 8.6*  < > 7.8*  AST 19  --   --   ALT 16  --   --   ALKPHOS 61  --   --   BILITOT 0.9  --   --   < > = values in this interval not displayed. ------------------------------------------------------------------------------------------------------------------  Cardiac Enzymes  Recent Labs Lab 10/18/16 0114  TROPONINI 0.08*   ------------------------------------------------------------------------------------------------------------------  RADIOLOGY:  No results found.  EKG:   Orders placed or performed during the hospital encounter of 10/17/16  . ED EKG 12-Lead  . ED EKG 12-Lead    ASSESSMENT AND PLAN:   66 y/o F with PMH of hypertension, COPD, ongoing smoking, diabetes, non alcoholic hepatic cirrhosis admitted for Shortness of breath,  confusion and sepsis  * Sepsis-secondary to Escherichia coli bacteremia and UTI -final culture results showing ESBL positive E.coli - on meropenem -ID consult. Will need a PICC line and 2 weeks of IV antibiotics  * Acute delirium- while  in the ICU. Required Precedex drip. -delirium resolved and patient is back to baseline mental status -No history of alcohol use. - Metabolic encephalopathy-secondary to sepsis -resolved at this time.   * Acute respiratory failure-secondary to COPD exacerbation. Requiring BiPAP prn -Currently on 2 L nasal cannula. Not on home oxygen. -off steroids. Wean off oxygen as tolerated. -follow-up chest x-ray today, encouraged to do incentive spirometry  * Liver cirrhosis-known history of esophageal varices requiring banding-last one at the end of July.  -Needs outpatient follow-up for repeat EGD. No hematemesis at this time. Monitor - Monitor hemoglobin due to known history of anemia and thrombocytopenia. No active bleeding noted at this time  * DM-- on levemir and ssi  * Depression and anxiety- meds were on hold due to delirium- can be restarted   * Tobacco use disorder- nicotine patch  * hypokalemia-being replaced  * DVT Prophylaxis- TEDs and SCDs- due to anemia and chronic thrombocytopenia, not on heparin products   All the records are reviewed and case discussed with Care Management/Social Workerr. Management plans discussed with the patient, family and they are in agreement.  CODE STATUS: Full code  TOTAL TIME TAKING CARE OF THIS PATIENT: 32 minutes.   POSSIBLE D/C IN 1-2 DAYS, DEPENDING ON CLINICAL CONDITION.   Gladstone Lighter M.D on 10/23/2016 at 10:07 AM  Between 7am to 6pm - Pager - [phone]  After 6pm go to [email or web address] - password [email or web address] [facility]  Office  [phone]  CC: Primary care physician; Adin Hector, MD

## 2016-10-23 NOTE — Progress Notes (Signed)
Initial Nutrition Assessment  DOCUMENTATION CODES:   Obesity unspecified  INTERVENTION:   Ensure Enlive po BID, each supplement provides 350 kcal and 20 grams of protein- Can switch to Premier Protein if elevated blood sugars.   MVI  Liberalize fat restriction from diet   NUTRITION DIAGNOSIS:   Inadequate oral intake related to acute illness as evidenced by meal completion < 50%  GOAL:   Patient will meet greater than or equal to 90% of their needs  MONITOR:   PO intake, Supplement acceptance, Labs, Weight trends  REASON FOR ASSESSMENT:   LOS    ASSESSMENT:   66 y/o female admitted for sepsis due to UTI, had complaints of fever, chills, nausea and SOB. PMH of HTN, diabetes, COPD, non-alcohol liver cirrhosis, had EGD in July 2018. Pt currently on oxygen due to acute respiratory failure due to COPD exacerbation.   Met with pt in room today. Pt reports poor appetite and oral intake for 3 days pta. Pt is currently eating <50% of her meals. Pt reports that she does drink chocolate or strawberry Ensure; RD will order as pt with increased estimated needs r/t cirrhosis and COPD and poor oral intake. Can switch to Premier Protein if elevated blood sugars. Per chart, pt is weight stable. Pt with hypokalemia; monitor and supplement as needed per MD discretion.   Medications reviewed and include: aspirin, insulin, protonix, meropenem, hydrocodone carafate    Labs reviewed: K 3.1(L), Ca 7.8(L) Ammonia- 37(H)- 10/20 Hgb 8.3(L), Hct 26.2(L) cbgs- 191, 107, 178 x 48 hrs AIC 8.2(H)- 7/21  Nutrition-Focused physical exam completed. Findings are no fat depletion, no muscle depletion, and no edema.   Diet Order:  Diet Carb Modified Fluid consistency: Thin; Room service appropriate? Yes  Skin:  Reviewed, no issues  Last BM:  10/18  Height:   Ht Readings from Last 1 Encounters:  10/18/16 5' 7"  (1.702 m)    Weight:   Wt Readings from Last 1 Encounters:  10/18/16 208 lb 8.9 oz  (94.6 kg)    Ideal Body Weight:  61.4 kg  BMI:  Body mass index is 32.66 kg/m.  Estimated Nutritional Needs:   Kcal:  1800-2100kcal/day   Protein:  95-113g/day   Fluid:  >1.8L/day   EDUCATION NEEDS:   Education needs addressed  Koleen Distance MS, RD, LDN Pager #[phone] After Hours Pager: [phone]

## 2016-10-23 NOTE — Progress Notes (Signed)
Infectious Disease Long Term IV Antibiotic Orders Vanessa Oneill Aug 28, 1950  Diagnosis: E Coli bacteremia- ESBL  Culture results   Culture -- (A)   ESCHERICHIA COLI  Confirmed Extended Spectrum Beta-Lactamase Producer (ESBL). In bloodstream infections from ESBL organisms, carbapenems are preferred over piperacillin/tazobactam. They are shown to have a lower risk of mortality.  Performed at [facility], [place] [address], [place], [place] [address]    Report Status 10/23/2016 FINAL   Organism ID, Bacteria ESCHERICHIA COLI  Culture & Susceptibility   ESCHERICHIA COLI   Antibiotic Sensitivity Microscan Status  AMPICILLIN Resistant >=32 RESISTANT Final  Method: MIC  AMPICILLIN/SULBACTAM Intermediate 16 INTERMEDIATE Final  Method: MIC  CEFAZOLIN Resistant >=64 RESISTANT Final  Method: MIC  CEFEPIME Resistant >=64 RESISTANT Final  Method: MIC  CEFTAZIDIME Resistant RESISTANT Final  Method: MIC  CEFTRIAXONE Resistant >=64 RESISTANT Final  Method: MIC  CIPROFLOXACIN Resistant >=4 RESISTANT Final  Method: MIC  ERTAPENEM Sensitive SENSITIVE <=0.5 Final  Method: MIC  Extended ESBL Resistant POSITIVE Final  Method: MIC  GENTAMICIN Sensitive <=1 SENSITIVE Final  Method: MIC  IMIPENEM Sensitive <=0.25 SENSITIVE Final  Method: MIC  PIP/TAZO Sensitive <=4 SENSITIVE Final  Method: MIC  TRIMETH/SULFA Resistant >=320 RESISTANT Final  Method: MIC            LABS Lab Results  Component Value Date   CREATININE 0.60 10/23/2016   Lab Results  Component Value Date   WBC 8.3 10/23/2016   HGB 8.3 (L) 10/23/2016   HCT 26.2 (L) 10/23/2016   MCV 70.1 (L) 10/23/2016   PLT 68 (L) 10/23/2016   No results found for: ESRSEDRATE, POCTSEDRATE No results found for: CRP  Allergies:  Allergies  Allergen Reactions  . Amoxicillin Itching and Rash    Discharge antibiotics Ertapenem       1 grams every  24 hours  PICC Care per protocol Labs weekly while on IV antibiotics  -FAX weekly labs to [phone] CBC w diff   Comprehensive met panel  Planned duration of antibiotics 14 days total   Stop date 11/01/16  Follow up clinic date prior to stop date   Leonel Ramsay, MD

## 2016-10-24 LAB — BASIC METABOLIC PANEL
Anion gap: 7 (ref 5–15)
BUN: 9 mg/dL (ref 6–20)
CO2: 27 mmol/L (ref 22–32)
CREATININE: 0.7 mg/dL (ref 0.44–1.00)
Calcium: 8.1 mg/dL — ABNORMAL LOW (ref 8.9–10.3)
Chloride: 104 mmol/L (ref 101–111)
GFR calc Af Amer: >60 mL/min (ref >60)
Glucose, Bld: 183 mg/dL — ABNORMAL HIGH (ref 65–99)
Potassium: 4 mmol/L (ref 3.5–5.1)
SODIUM: 138 mmol/L (ref 135–145)

## 2016-10-24 LAB — GLUCOSE, CAPILLARY
GLUCOSE-CAPILLARY: 239 mg/dL — AB (ref 65–99)
Glucose-Capillary: 159 mg/dL — ABNORMAL HIGH (ref 65–99)
Glucose-Capillary: 216 mg/dL — ABNORMAL HIGH (ref 65–99)

## 2016-10-24 LAB — SEDIMENTATION RATE: SED RATE: 91 mm/h — AB (ref 0–30)

## 2016-10-24 MED ORDER — SODIUM CHLORIDE 0.9 % IV SOLN: 1.0000 g | INTRAVENOUS | 0 refills | Status: DC

## 2016-10-24 MED ORDER — SODIUM CHLORIDE 0.9 % IV SOLN
1.0000 g | INTRAVENOUS | Status: DC
  Administered 2016-10-24: 1 g via INTRAVENOUS
  Filled 2016-10-24 (×2): qty 1

## 2016-10-24 MED ORDER — ENSURE ENLIVE PO LIQD: 237.0000 mL | Freq: Two times a day (BID) | ORAL | 12 refills | Status: DC

## 2016-10-24 MED ORDER — SODIUM CHLORIDE 0.9% FLUSH: 10.0000 mL | Freq: Two times a day (BID) | INTRAVENOUS | Status: DC

## 2016-10-24 MED ORDER — NADOLOL 20 MG PO TABS
20.0000 mg | ORAL_TABLET | Freq: Every day | ORAL | Status: DC
  Administered 2016-10-24: 20 mg via ORAL
  Filled 2016-10-24 (×2): qty 1

## 2016-10-24 MED ORDER — NADOLOL 40 MG PO TABS: 40.0000 mg | ORAL_TABLET | Freq: Every day | ORAL | 2 refills | Status: DC

## 2016-10-24 MED ORDER — SODIUM CHLORIDE 0.9% FLUSH: 10.0000 mL | INTRAVENOUS | Status: DC | PRN

## 2016-10-24 MED ORDER — NYSTATIN 100000 UNIT/ML MT SUSP: 5.0000 mL | Freq: Four times a day (QID) | OROMUCOSAL | 0 refills | Status: DC

## 2016-10-24 MED ORDER — NYSTATIN 100000 UNIT/ML MT SUSP
5.0000 mL | Freq: Four times a day (QID) | OROMUCOSAL | Status: DC
  Administered 2016-10-24: 500000 [IU] via OROMUCOSAL
  Filled 2016-10-24: qty 5

## 2016-10-24 MED ORDER — FUROSEMIDE 10 MG/ML IJ SOLN
40.0000 mg | Freq: Once | INTRAMUSCULAR | Status: AC
  Administered 2016-10-24: 40 mg via INTRAVENOUS
  Filled 2016-10-24: qty 4

## 2016-10-24 NOTE — Progress Notes (Signed)
Pt walked into the bathroom to have a BM.  Tolerated the walk,  SaO2 @91 %

## 2016-10-24 NOTE — Progress Notes (Signed)
[facility] at [place] NAME: Vanessa Oneill    MR#:  [id number]  DATE OF BIRTH:  1950-09-29  SUBJECTIVE:  CHIEF COMPLAINT:   Chief Complaint  Patient presents with  . Code Sepsis   - admitted with sepsis secondary to ESBL bacteremia and UTI. -very pleasant and cooperative this morning. Remains on 2 L oxygen. Ambulating with physical therapy in the hallway  REVIEW OF SYSTEMS:  Review of Systems  Constitutional: Positive for malaise/fatigue. Negative for chills and fever.  HENT: Negative for congestion, ear discharge, hearing loss and nosebleeds.   Eyes: Negative for blurred vision and double vision.  Respiratory: Negative for cough, shortness of breath and wheezing.   Cardiovascular: Negative for chest pain and palpitations.  Gastrointestinal: Negative for abdominal pain, constipation, diarrhea, nausea and vomiting.  Genitourinary: Negative for dysuria.  Musculoskeletal: Positive for back pain. Negative for myalgias.  Neurological: Negative for dizziness, seizures and headaches.  Psychiatric/Behavioral: The patient is not nervous/anxious.     DRUG ALLERGIES:   Allergies  Allergen Reactions  . Amoxicillin Itching and Rash    VITALS:  Blood pressure 137/76, pulse 96, temperature 98.2 F (36.8 C), temperature source Oral, resp. rate 16, height 5\' 7"  (1.702 m), weight 94.6 kg (208 lb 8.9 oz), SpO2 91 %.  PHYSICAL EXAMINATION:  Physical Exam  GENERAL:  66 y.o.-year-old patient lying in the bed, not in acute distress. EYES: Pupils equal, round, reactive to light and accommodation. No scleral icterus. Extraocular muscles intact.  HEENT: Head atraumatic, normocephalic. Oropharynx and nasopharynx clear.  NECK:  Supple, no jugular venous distention. No thyroid enlargement, no tenderness.  LUNGS: Normal breath sounds bilaterally, no wheezing, no rales,rhonchi or crepitation. No use of accessory muscles of respiration. Slight crackles at  the bases. CARDIOVASCULAR: S1, S2 normal. No murmurs, rubs, or gallops.  ABDOMEN: Soft, nontender,  slightly distended. Bowel sounds present. No organomegaly or mass.  EXTREMITIES: No pedal edema, cyanosis, or clubbing.  NEUROLOGIC: Cranial nerves II through XII are intact. Muscle strength 5/5 in all extremities. Sensation intact. Unsteady gait due to weakness noted this morning.  PSYCHIATRIC: The patient is alert and oriented x 3  SKIN: No obvious rash, lesion, or ulcer.    LABORATORY PANEL:   CBC  Recent Labs Lab 10/23/16 0329  WBC 8.3  HGB 8.3*  HCT 26.2*  PLT 68*   ------------------------------------------------------------------------------------------------------------------  Chemistries   Recent Labs Lab 10/21/16 0446  10/24/16 0421  NA 140  < > 138  K 3.7  < > 4.0  CL 103  < > 104  CO2 23  < > 27  GLUCOSE 191*  < > 183*  BUN 22*  < > 9  CREATININE 0.66  < > 0.70  CALCIUM 8.6*  < > 8.1*  AST 19  --   --   ALT 16  --   --   ALKPHOS 61  --   --   BILITOT 0.9  --   --   < > = values in this interval not displayed. ------------------------------------------------------------------------------------------------------------------  Cardiac Enzymes  Recent Labs Lab 10/18/16 0114  TROPONINI 0.08*   ------------------------------------------------------------------------------------------------------------------  RADIOLOGY:  Dg Chest 2 View  Result Date: 10/23/2016 CLINICAL DATA:  Shortness of Breath EXAM: CHEST  2 VIEW COMPARISON:  October 21, 2016 FINDINGS: There is considerably less interstitial edema compared to recent study. Trace edema remains. There is a small right pleural effusion. There is no airspace consolidation. Heart is mildly enlarged with mild  pulmonary venous hypertension. No adenopathy. No bone lesions. IMPRESSION: Considerably less pulmonary edema compared to recent study. Slight amount of edema remains. No consolidation. Small pleural  effusion on the right noted. There is stable pulmonary vascular congestion with cardiomegaly and pulmonary venous hypertension. Electronically Signed   By: Lowella Grip III M.D.   On: 10/23/2016 10:58    EKG:   Orders placed or performed during the hospital encounter of 10/17/16  . ED EKG 12-Lead  . ED EKG 12-Lead    ASSESSMENT AND PLAN:   66 y/o F with PMH of hypertension, COPD, ongoing smoking, diabetes, non alcoholic hepatic cirrhosis admitted for Shortness of breath, confusion and sepsis  * Sepsis-secondary to Escherichia coli bacteremia and UTI -final culture results showing ESBL positive E.coli - on meropenem-changed to Invanz at discharge -ID consult. Will need a PICC line and 2 weeks of IV antibiotics  * Acute delirium- while in the ICU. Required Precedex drip. -delirium resolved and patient is back to baseline mental status -No history of alcohol use. - Metabolic encephalopathy-secondary to sepsis -resolved at this time.   * Acute respiratory failure-secondary to COPD exacerbation. Required BiPAP on admission. -Currently on 1 L nasal cannula. Not on home oxygen. -off steroids. Wean off oxygen as tolerated. -follow-up chest x-ray with hyperinflated lung fields, encouraged to do incentive spirometry. One dose of Lasix today  * Liver cirrhosis-known history of esophageal varices requiring banding-last one at the end of July.  -Needs outpatient follow-up for repeat EGD. No hematemesis at this time. Monitor - Monitor hemoglobin due to known history of anemia and thrombocytopenia. No active bleeding noted at this time -Restarted nadolol  * DM-- on levemir and ssi  * Depression and anxiety- restarted home medications  * Tobacco use disorder- nicotine patch  * hypokalemia-replaced  * DVT Prophylaxis- TEDs and SCDs- due to anemia and chronic thrombocytopenia, not on heparin products  Physical therapy recommended home health. If able to be weaned off oxygen today, can  be discharged today   All the records are reviewed and case discussed with Care Management/Social Workerr. Management plans discussed with the patient, family and they are in agreement.  CODE STATUS: Full code  TOTAL TIME TAKING CARE OF THIS PATIENT: 37 minutes.   POSSIBLE D/C today or tomorrow, DEPENDING ON CLINICAL CONDITION.   Kaydince Towles M.D on 10/24/2016 at 1:20 PM  Between 7am to 6pm - Pager - [phone]  After 6pm go to [email or web address] - password [email or web address] [facility]  Office  [phone]  CC: Primary care physician; Adin Hector, MD

## 2016-10-24 NOTE — Care Management (Signed)
Patient admitted from home with Sepsis. Patient lives at home with husband.  PCP [facility].  PCP [facility]. PT has assessed patient and recommends home health PT. Patient states "I have a walker like that at home, but I don't need that".  Patient will require home IV antibiotics at discharge. Patient agreeable to home health services.  Home health agency preference provided.  Patient states that she does not have a preference.  Referral made to [facility] with [facility].  Patient currently requiring acute O2.  Bedside RN to check qualifying saturations.  RNCM following.

## 2016-10-24 NOTE — Care Management (Signed)
Bedside RN reported that patient was ambulated and does not require home O2.  Corene Cornea with [facility] notified of discharge.  Invanz administered at 1309.  [facility] to provide start of care 10/25/16.  RNCM signing off

## 2016-10-24 NOTE — Progress Notes (Signed)
Pt alert and oriented x4, no complaints of pain or discomfort.  Bed in low position, call bell within reach.  Bed alarms on and functioning.  Assessment done and charted.  Will continue to monitor and do hourly rounding throughout the shift 

## 2016-10-24 NOTE — Progress Notes (Signed)
Peripherally Inserted Central Catheter/Midline Placement  The IV Nurse has discussed with the patient and/or persons authorized to consent for the patient, the purpose of this procedure and the potential benefits and risks involved with this procedure.  The benefits include less needle sticks, lab draws from the catheter, and the patient may be discharged home with the catheter. Risks include, but not limited to, infection, bleeding, blood clot (thrombus formation), and puncture of an artery; nerve damage and irregular heartbeat and possibility to perform a PICC exchange if needed/ordered by physician.  Alternatives to this procedure were also discussed.  B[employer] Power PICC patient education guide, fact sheet on infection prevention and patient information card has been provided to patient /or left at bedside.    PICC/Midline Placement Documentation        Vanessa Oneill 10/24/2016, 11:12 AM

## 2016-10-24 NOTE — Progress Notes (Signed)
Discharge: Pt d/c from room via wheelchair, Family member with the pt. Discharge instructions given to the patient and family members.  No questions from pt, reintegrated to the pt to call or go to the ED for chest discomfort. Pt dressed in street clothes and left with discharge papers and prescriptions in hand. IV d/ced, tele removed and no complaints of pain or discomfort. 

## 2016-10-24 NOTE — Progress Notes (Signed)
Walked pt to bathroom and to the chair in the room.  Her SaO2 @91 %  Dr Brandt Loosen made aware.

## 2016-10-24 NOTE — Progress Notes (Signed)
[place] INFECTIOUS DISEASE PROGRESS NOTE Date of Admission:  10/17/2016     ID: Vanessa Oneill is a 66 y.o. female with  ESBL E coli bacteremia and UTI Active Problems:   Sepsis ([place])   Septic shock ([place])   Urinary tract infection without hematuria   Respiratory distress   Subjective: No fevers, less confused. picc placed  ROS  Eleven systems are reviewed and negative except per hpi  Medications:  Antibiotics Given (last 72 hours)    Date/Time Action Medication Dose Rate   10/21/16 1343 New Bag/Given   meropenem (MERREM) 1 g in sodium chloride 0.9 % 100 mL IVPB 1 g 200 mL/hr   10/21/16 2124 New Bag/Given   meropenem (MERREM) 1 g in sodium chloride 0.9 % 100 mL IVPB 1 g 200 mL/hr   10/22/16 0556 New Bag/Given   meropenem (MERREM) 1 g in sodium chloride 0.9 % 100 mL IVPB 1 g 200 mL/hr   10/22/16 1446 New Bag/Given   meropenem (MERREM) 1 g in sodium chloride 0.9 % 100 mL IVPB 1 g 200 mL/hr   10/22/16 2208 New Bag/Given   meropenem (MERREM) 1 g in sodium chloride 0.9 % 100 mL IVPB 1 g 200 mL/hr   10/23/16 0017 New Bag/Given   meropenem (MERREM) 1 g in sodium chloride 0.9 % 100 mL IVPB 1 g 200 mL/hr   10/23/16 1412 New Bag/Given   meropenem (MERREM) 1 g in sodium chloride 0.9 % 100 mL IVPB 1 g 200 mL/hr   10/23/16 2053 New Bag/Given   meropenem (MERREM) 1 g in sodium chloride 0.9 % 100 mL IVPB 1 g 200 mL/hr   10/24/16 0528 New Bag/Given   meropenem (MERREM) 1 g in sodium chloride 0.9 % 100 mL IVPB 1 g 200 mL/hr   10/24/16 1309 New Bag/Given   ertapenem (INVANZ) 1 g in sodium chloride 0.9 % 50 mL IVPB 1 g 100 mL/hr     . aspirin EC  81 mg Oral Daily  . budesonide (PULMICORT) nebulizer solution  0.25 mg Nebulization Q6H  . buPROPion  150 mg Oral BID  . cyclobenzaprine  5 mg Oral TID  . escitalopram  10 mg Oral Daily  . feeding supplement (ENSURE ENLIVE)  237 mL Oral BID BM  . gabapentin  100 mg Oral BID  . insulin aspart  0-15 Units Subcutaneous TID WC  .  insulin aspart  0-5 Units Subcutaneous QHS  . insulin detemir  20 Units Subcutaneous QHS  . ipratropium-albuterol  3 mL Nebulization Q6H  . multivitamin with minerals  1 tablet Oral Daily  . nadolol  20 mg Oral Daily  . pantoprazole  40 mg Oral BID  . pneumococcal 23 valent vaccine  0.5 mL Intramuscular Tomorrow-1000  . rOPINIRole  1 mg Oral QHS  . sodium chloride flush  10-40 mL Intracatheter Q12H    Objective: Vital signs in last 24 hours: Temp:  [98.1 F (36.7 C)-98.2 F (36.8 C)] 98.2 F (36.8 C) (10/23 0533) Pulse Rate:  [96-97] 96 (10/23 0533) Resp:  [16-18] 16 (10/23 0533) BP: (134-137)/(54-76) 137/76 (10/23 0533) SpO2:  [91 %-98 %] 91 % (10/23 4944) Constitutional:  oriented to person, place, and time.obese, ill appearing. HENT: [place]/AT, PERRLA, no scleral icterus Mouth/Throat: Oropharynx is clear and moist. No oropharyngeal exudate.  Cardiovascular: Normal rate, regular rhythm and normal heart sounds.  Pulmonary/Chest: Effort normal and breath sounds normal. No respiratory distress.  has no wheezes.  Neck = supple, no nuchal rigidity Abdominal:  Soft. Bowel sounds are normal.  exhibits no distension. There is no tenderness.  Lymphadenopathy: no cervical adenopathy. No axillary adenopathy Neurological: alert and oriented to person, place, and time.  Skin: Skin is warm and dry. No rash noted. No erythema. + ttp over T and L spine  Psychiatric: a normal mood and affect.  behavior is normal.   Lab Results  Recent Labs  10/22/16 0423 10/23/16 0329 10/24/16 0421  WBC 5.7 8.3  --   HGB 7.9* 8.3*  --   HCT 24.9* 26.2*  --   NA 135 137 138  K 3.0* 3.1* 4.0  CL 101 103 104  CO2 25 26 27   BUN 15 11 9   CREATININE 0.65 0.60 0.70    Microbiology: Results for orders placed or performed during the hospital encounter of 10/17/16  Blood Culture (routine x 2)     Status: Abnormal   Collection Time: 10/17/16  9:21 PM  Result Value Ref Range Status   Specimen Description  BLOOD LEFT WRIST  Final   Special Requests   Final    BOTTLES DRAWN AEROBIC AND ANAEROBIC Blood Culture adequate volume   Culture  Setup Time   Final    GRAM NEGATIVE RODS IN BOTH AEROBIC AND ANAEROBIC BOTTLES CRITICAL VALUE NOTED.  VALUE IS CONSISTENT WITH PREVIOUSLY REPORTED AND CALLED VALUE. GRAM STAIN REVIEWED-AGREE WITH RESULT    Culture (A)  Final    ESCHERICHIA COLI SUSCEPTIBILITIES PERFORMED ON PREVIOUS CULTURE WITHIN THE LAST 5 DAYS. Performed at [facility], [place] [address], [place], [place] [address]    Report Status 10/20/2016 FINAL  Final  Blood Culture (routine x 2)     Status: Abnormal   Collection Time: 10/17/16  9:21 PM  Result Value Ref Range Status   Specimen Description BLOOD RAC  Final   Special Requests   Final    BOTTLES DRAWN AEROBIC AND ANAEROBIC Blood Culture adequate volume   Culture  Setup Time   Final    GRAM NEGATIVE RODS IN BOTH AEROBIC AND ANAEROBIC BOTTLES CRITICAL RESULT CALLED TO, READ BACK BY AND VERIFIED WITH:  HANK ZOMPA AT 8676 10/18/16 SDR    Culture (A)  Final    ESCHERICHIA COLI Confirmed Extended Spectrum Beta-Lactamase Producer (ESBL).  In bloodstream infections from ESBL organisms, carbapenems are preferred over piperacillin/tazobactam. They are shown to have a lower risk of mortality. Performed at [facility], [place] [address], [place], [place] [address]    Report Status 10/23/2016 FINAL  Final   Organism ID, Bacteria ESCHERICHIA COLI  Final      Susceptibility   Escherichia coli - MIC*    AMPICILLIN >=32 RESISTANT Resistant     CEFAZOLIN >=64 RESISTANT Resistant     CEFEPIME >=64 RESISTANT Resistant     CEFTAZIDIME RESISTANT Resistant     CEFTRIAXONE >=64 RESISTANT Resistant     CIPROFLOXACIN >=4 RESISTANT Resistant     GENTAMICIN <=1 SENSITIVE Sensitive     IMIPENEM <=0.25 SENSITIVE Sensitive     TRIMETH/SULFA >=320 RESISTANT Resistant     AMPICILLIN/SULBACTAM 16 INTERMEDIATE Intermediate     PIP/TAZO <=4  SENSITIVE Sensitive     Extended ESBL POSITIVE Resistant     ERTAPENEM Value in next row Sensitive      SENSITIVE<=0.5    * ESCHERICHIA COLI  Blood Culture ID Panel (Reflexed)     Status: Abnormal   Collection Time: 10/17/16  9:21 PM  Result Value Ref Range Status   Enterococcus species  NOT DETECTED NOT DETECTED Final   Listeria monocytogenes NOT DETECTED NOT DETECTED Final   Staphylococcus species NOT DETECTED NOT DETECTED Final   Staphylococcus aureus NOT DETECTED NOT DETECTED Final   Streptococcus species NOT DETECTED NOT DETECTED Final   Streptococcus agalactiae NOT DETECTED NOT DETECTED Final   Streptococcus pneumoniae NOT DETECTED NOT DETECTED Final   Streptococcus pyogenes NOT DETECTED NOT DETECTED Final   Acinetobacter baumannii NOT DETECTED NOT DETECTED Final   Enterobacteriaceae species DETECTED (A) NOT DETECTED Final    Comment: Enterobacteriaceae represent a large family of gram-negative bacteria, not a single organism. CRITICAL RESULT CALLED TO, READ BACK BY AND VERIFIED WITH:  HANK ZOMPA AT 1062 10/18/16 SDR    Enterobacter cloacae complex NOT DETECTED NOT DETECTED Final   Escherichia coli DETECTED (A) NOT DETECTED Final    Comment: CRITICAL RESULT CALLED TO, READ BACK BY AND VERIFIED WITH:  HANK ZOMPA AT 6948 10/18/16 SDR    Klebsiella oxytoca NOT DETECTED NOT DETECTED Final   Klebsiella pneumoniae NOT DETECTED NOT DETECTED Final   Proteus species NOT DETECTED NOT DETECTED Final   Serratia marcescens NOT DETECTED NOT DETECTED Final   Carbapenem resistance NOT DETECTED NOT DETECTED Final   Haemophilus influenzae NOT DETECTED NOT DETECTED Final   Neisseria meningitidis NOT DETECTED NOT DETECTED Final   Pseudomonas aeruginosa NOT DETECTED NOT DETECTED Final   Candida albicans NOT DETECTED NOT DETECTED Final   Candida glabrata NOT DETECTED NOT DETECTED Final   Candida krusei NOT DETECTED NOT DETECTED Final   Candida parapsilosis NOT DETECTED NOT DETECTED Final    Candida tropicalis NOT DETECTED NOT DETECTED Final  Urine culture     Status: Abnormal   Collection Time: 10/17/16  9:26 PM  Result Value Ref Range Status   Specimen Description URINE, RANDOM  Final   Special Requests NONE  Final   Culture (A)  Final    >=100,000 COLONIES/mL ESCHERICHIA COLI Confirmed Extended Spectrum Beta-Lactamase Producer (ESBL).  In bloodstream infections from ESBL organisms, carbapenems are preferred over piperacillin/tazobactam. They are shown to have a lower risk of mortality. Performed at [facility], [place] [address], [place], [place] [address]    Report Status 10/23/2016 FINAL  Final   Organism ID, Bacteria ESCHERICHIA COLI (A)  Final      Susceptibility   Escherichia coli - MIC*    AMPICILLIN >=32 RESISTANT Resistant     CEFAZOLIN >=64 RESISTANT Resistant     CEFTRIAXONE >=64 RESISTANT Resistant     CIPROFLOXACIN >=4 RESISTANT Resistant     GENTAMICIN <=1 SENSITIVE Sensitive     IMIPENEM <=0.25 SENSITIVE Sensitive     NITROFURANTOIN <=16 SENSITIVE Sensitive     TRIMETH/SULFA >=320 RESISTANT Resistant     AMPICILLIN/SULBACTAM 16 INTERMEDIATE Intermediate     PIP/TAZO <=4 SENSITIVE Sensitive     Extended ESBL POSITIVE Resistant     ERTAPENEM Value in next row Sensitive      SENSITIVE<=0.5    * >=100,000 COLONIES/mL ESCHERICHIA COLI  MRSA PCR Screening     Status: None   Collection Time: 10/18/16  1:32 AM  Result Value Ref Range Status   MRSA by PCR NEGATIVE NEGATIVE Final    Comment:        The GeneXpert MRSA Assay (FDA approved for NASAL specimens only), is one component of a comprehensive MRSA colonization surveillance program. It is not intended to diagnose MRSA infection nor to guide or monitor treatment for MRSA infections.     Studies/Results: Dg Chest  2 View  Result Date: 10/23/2016 CLINICAL DATA:  Shortness of Breath EXAM: CHEST  2 VIEW COMPARISON:  October 21, 2016 FINDINGS: There is considerably less interstitial edema  compared to recent study. Trace edema remains. There is a small right pleural effusion. There is no airspace consolidation. Heart is mildly enlarged with mild pulmonary venous hypertension. No adenopathy. No bone lesions. IMPRESSION: Considerably less pulmonary edema compared to recent study. Slight amount of edema remains. No consolidation. Small pleural effusion on the right noted. There is stable pulmonary vascular congestion with cardiomegaly and pulmonary venous hypertension. Electronically Signed   By: Lowella Grip III M.D.   On: 10/23/2016 10:58    Assessment/Plan: Vanessa Oneill is a 66 y.o. female with ESBL UTI and bacteremia. Has been on appropriate treatment since 10/17 and clinically improving. Day 6 of treatment. She remains weak and fatigued. She complains of back pain but [name] neg for pyelonephritis but I think she likely does have pyelo. I think discitis is unlikely but will need to monitor for it.  Discussed with patient and family will need picc and min 14 days total abx with ertapenem.  CRP 16.5, ESR 91. Recommendations Ertapenem x 14 days- see abx order sheet I can see see at end of treatment course. Thank you very much for the consult. Will follow with you.  Leonel Ramsay   10/24/2016, 1:24 PM

## 2016-10-24 NOTE — Care Management Important Message (Signed)
Important Message  Patient Details  Name: Cristol Engdahl MRN: [id number] Date of Birth: 09-Apr-1950   Medicare Important Message Given:  Yes    Beverly Sessions, RN 10/24/2016, 11:55 AM

## 2016-10-25 NOTE — Discharge Summary (Signed)
[facility] NAME: Vanessa Oneill    MR#:  [id number]  DATE OF BIRTH:  1950/05/24  DATE OF ADMISSION:  10/17/2016   ADMITTING PHYSICIAN: Demetrios Loll, MD  DATE OF DISCHARGE: 10/24/2016  5:36 PM  PRIMARY CARE PHYSICIAN: Adin Hector, MD   ADMISSION DIAGNOSIS:   Respiratory distress [R06.03] Sepsis, due to unspecified organism ([place]) [A41.9] Urinary tract infection without hematuria, site unspecified [N39.0] Septic shock ([place]) [A41.9, R65.21]  DISCHARGE DIAGNOSIS:   Active Problems:   Sepsis ([place])   Septic shock ([place])   Urinary tract infection without hematuria   Respiratory distress   SECONDARY DIAGNOSIS:   Past Medical History:  Diagnosis Date  . COPD (chronic obstructive pulmonary disease) ([place])   . Diabetes mellitus without complication ([place])   . Hypertension   . 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[place])   . Personal history of tobacco use, presenting hazards to health 11/12/2014    HOSPITAL COURSE:   66 y/o F with PMH of hypertension, COPD, ongoing smoking, diabetes, non alcoholic hepatic cirrhosis admitted for Shortness of breath, confusion and sepsis  * Sepsis-secondary to Escherichia coli bacteremia and UTI -final culture results showing ESBL positive E.coli - on meropenem-changed to Invanz at discharge - Appreciate ID consult. Received a PICC line for 2 weeks of IV antibiotics with Invanz qdaily  * Acute delirium- while in the ICU. Required Precedex drip. -delirium resolved and patient is back to baseline mental status -No history of alcohol use. - Metabolic encephalopathy-secondary to sepsis -resolved at this time.   * Acute respiratory failure-secondary to COPD exacerbation. Required BiPAP on admission. -And then she was on nasal cannula, at the time of discharge she was off of nasal cannula oxygen, and room air saturating up to 91% with exertion. -off steroids. -follow-up chest x-ray with hyperinflated  lung fields, encouraged to do incentive spirometry. Received 1 dose of Lasix prior to discharge.  * Liver cirrhosis-known history of esophageal varices requiring banding-last one at the end of July.  -Needs outpatient follow-up for repeat EGD. No hematemesis at this time. Monitor - Monitor hemoglobin due to known history of anemia and thrombocytopenia. No active bleeding noted at this time -Restarted nadolol  * DM-- restart home doses of NPH insulin, metformin  * Depression and anxiety- restarted home medications-on Lexapro, bupropion  * Tobacco use disorder- counseled while in the hospital, received nicotine patch here  * hypokalemia-replaced  Physical therapy recommended home health. Patient will be discharged today  DISCHARGE CONDITIONS:   Guarded  CONSULTS OBTAINED:   Treatment Team:  Leonel Ramsay, MD  DRUG ALLERGIES:   Allergies  Allergen Reactions  . Amoxicillin Itching and Rash   DISCHARGE MEDICATIONS:   Allergies as of 10/24/2016      Reactions   Amoxicillin Itching, Rash      Medication List    STOP taking these medications   fluconazole 150 MG tablet Commonly known as:  DIFLUCAN   glimepiride 4 MG tablet Commonly known as:  AMARYL   hydrochlorothiazide 25 MG tablet Commonly known as:  HYDRODIURIL   insulin regular 100 units/mL injection Commonly known as:  NOVOLIN R,HUMULIN R   ketorolac 10 MG tablet Commonly known as:  TORADOL   levofloxacin 500 MG tablet Commonly known as:  LEVAQUIN   losartan 100 MG tablet Commonly known as:  COZAAR   SUMAtriptan 50 MG tablet Commonly known as:  IMITREX     TAKE these medications   ALPRAZolam 0.25  MG tablet Commonly known as:  XANAX Take 0.25 mg by mouth at bedtime as needed for sleep.   aspirin EC 81 MG tablet Take 81 mg by mouth daily.   azelastine 0.1 % nasal spray Commonly known as:  ASTELIN Place 1 spray into both nostrils 2 (two) times daily as needed for rhinitis. Use in each  nostril as directed   buPROPion 150 MG 24 hr tablet Commonly known as:  WELLBUTRIN XL Take 150 mg by mouth 2 (two) times daily.   cyclobenzaprine 10 MG tablet Commonly known as:  FLEXERIL Take 10 mg by mouth 3 (three) times daily as needed for muscle spasms.   ertapenem 1 g in sodium chloride 0.9 % 50 mL Inject 1 g into the vein daily. X 10 days   escitalopram 20 MG tablet Commonly known as:  LEXAPRO Take 10 mg by mouth daily.   feeding supplement (ENSURE ENLIVE) Liqd Take 237 mLs by mouth 2 (two) times daily between meals.   gabapentin 100 MG capsule Commonly known as:  NEURONTIN Take 100 mg by mouth 2 (two) times daily.   HYDROcodone-acetaminophen 5-325 MG tablet Commonly known as:  NORCO/VICODIN Take 1 tablet by mouth every 6 (six) hours as needed for moderate pain.   insulin NPH Human 100 UNIT/ML injection Commonly known as:  HUMULIN N,NOVOLIN N Inject 30 Units into the skin 2 (two) times daily before a meal. Use 30 units in the morning, 20 units during the day and 30 units at night.   metFORMIN 500 MG 24 hr tablet Commonly known as:  GLUCOPHAGE-XR Take 1,000 mg by mouth daily.   nadolol 40 MG tablet Commonly known as:  CORGARD Take 1 tablet (40 mg total) by mouth daily. What changed:  medication strength  how much to take   nystatin 100000 UNIT/ML suspension Commonly known as:  MYCOSTATIN Use as directed 5 mLs (500,000 Units total) in the mouth or throat 4 (four) times daily.   pantoprazole 40 MG tablet Commonly known as:  PROTONIX Take 40 mg by mouth 2 (two) times daily.   rOPINIRole 1 MG tablet Commonly known as:  REQUIP Take 1 mg by mouth at bedtime.   sucralfate 1 GM/10ML suspension Commonly known as:  CARAFATE Take 10 mLs (1 g total) by mouth 4 (four) times daily as needed (chest discomfort).   traZODone 50 MG tablet Commonly known as:  DESYREL Take 50 mg by mouth at bedtime as needed for sleep.            Home Infusion Instuctions         Start     Ordered   10/24/16 0000  Home infusion instructions [facility] May follow [facility] Dosing Protocol; May administer Cathflo as needed to maintain patency of vascular access device.; Flushing of vascular access device: per [facility] Protocol: 0.9% NaCl pre/post medica...    Question Answer Comment  Instructions May follow [facility] Dosing Protocol   Instructions May administer Cathflo as needed to maintain patency of vascular access device.   Instructions Flushing of vascular access device: per [facility] Protocol: 0.9% NaCl pre/post medication administration and prn patency; Heparin 100 u/ml, 55ml for implanted ports and Heparin 10u/ml, 24ml for all other central venous catheters.   Instructions May follow [facility] Anaphylaxis Protocol for First Dose Administration in the home: 0.9% NaCl at 25-50 ml/hr to maintain IV access for protocol meds. Epinephrine 0.3 ml IV/IM PRN and Benadryl 25-50 IV/IM PRN s/s of anaphylaxis.   Instructions [facility] Infusion Coordinator (RN) to assist per patient IV care needs in the home PRN.      10/24/16 1229       DISCHARGE INSTRUCTIONS:   1. PCP f/u in 1 week 2,. ID f/u in 2 week  DIET:   Cardiac diet  ACTIVITY:   Activity as tolerated  OXYGEN:   Home Oxygen: No.  Oxygen Delivery: room air  DISCHARGE LOCATION:   home   If you experience worsening of your admission symptoms, develop shortness of breath, life threatening emergency, suicidal or homicidal thoughts you must seek medical attention immediately by calling 911 or calling your MD immediately  if symptoms less severe.  You Must read complete instructions/literature along with all the possible adverse reactions/side effects for all the Medicines you take and that have been prescribed to you. Take any new Medicines after you have completely understood and accpet all the possible adverse reactions/side effects.   Please note  You were cared for by a hospitalist during  your hospital stay. If you have any questions about your discharge medications or the care you received while you were in the hospital after you are discharged, you can call the unit and asked to speak with the hospitalist on call if the hospitalist that took care of you is not available. Once you are discharged, your primary care physician will handle any further medical issues. Please note that NO REFILLS for any discharge medications will be authorized once you are discharged, as it is imperative that you return to your primary care physician (or establish a relationship with a primary care physician if you do not have one) for your aftercare needs so that they can reassess your need for medications and monitor your lab values.    On the day of Discharge:  VITAL SIGNS:   Blood pressure (!) 143/76, pulse 98, temperature 98.1 F (36.7 C), temperature source Oral, resp. rate 20, height 5\' 7"  (1.702 m), weight 94.6 kg (208 lb 8.9 oz), SpO2 91 %.  PHYSICAL EXAMINATION:   GENERAL:  66 y.o.-year-old patient lying in the bed, not in acute distress. EYES: Pupils equal, round, reactive to light and accommodation. No scleral icterus. Extraocular muscles intact.  HEENT: Head atraumatic, normocephalic. Oropharynx and nasopharynx clear.  NECK:  Supple, no jugular venous distention. No thyroid enlargement, no tenderness.  LUNGS: Normal breath sounds bilaterally, no wheezing, no rales,rhonchi or crepitation. No use of accessory muscles of respiration. Slight crackles at the bases. CARDIOVASCULAR: S1, S2 normal. No murmurs, rubs, or gallops.  ABDOMEN: Soft, nontender,  slightly distended. Bowel sounds present. No organomegaly or mass.  EXTREMITIES: No pedal edema, cyanosis, or clubbing.  NEUROLOGIC: Cranial nerves II through XII are intact. Muscle strength 5/5 in all extremities. Sensation intact.   PSYCHIATRIC: The patient is alert and oriented x 3  SKIN: No obvious rash, lesion, or ulcer.   DATA REVIEW:     CBC  Recent Labs Lab 10/23/16 0329  WBC 8.3  HGB 8.3*  HCT 26.2*  PLT 68*    Chemistries   Recent Labs Lab 10/21/16 0446  10/24/16 0421  NA 140  < > 138  K 3.7  < > 4.0  CL 103  < > 104  CO2 23  < > 27  GLUCOSE 191*  < > 183*  BUN 22*  < > 9  CREATININE 0.66  < > 0.70  CALCIUM 8.6*  < > 8.1*  AST 19  --   --   ALT  16  --   --   ALKPHOS 61  --   --   BILITOT 0.9  --   --   < > = values in this interval not displayed.   Microbiology Results  Results for orders placed or performed during the hospital encounter of 10/17/16  Blood Culture (routine x 2)     Status: Abnormal   Collection Time: 10/17/16  9:21 PM  Result Value Ref Range Status   Specimen Description BLOOD LEFT WRIST  Final   Special Requests   Final    BOTTLES DRAWN AEROBIC AND ANAEROBIC Blood Culture adequate volume   Culture  Setup Time   Final    GRAM NEGATIVE RODS IN BOTH AEROBIC AND ANAEROBIC BOTTLES CRITICAL VALUE NOTED.  VALUE IS CONSISTENT WITH PREVIOUSLY REPORTED AND CALLED VALUE. GRAM STAIN REVIEWED-AGREE WITH RESULT    Culture (A)  Final    ESCHERICHIA COLI SUSCEPTIBILITIES PERFORMED ON PREVIOUS CULTURE WITHIN THE LAST 5 DAYS. Performed at [facility], [place] [address] [place], [place] [address]    Report Status 10/20/2016 FINAL  Final  Blood Culture (routine x 2)     Status: Abnormal   Collection Time: 10/17/16  9:21 PM  Result Value Ref Range Status   Specimen Description BLOOD RAC  Final   Special Requests   Final    BOTTLES DRAWN AEROBIC AND ANAEROBIC Blood Culture adequate volume   Culture  Setup Time   Final    GRAM NEGATIVE RODS IN BOTH AEROBIC AND ANAEROBIC BOTTLES CRITICAL RESULT CALLED TO, READ BACK BY AND VERIFIED WITH:  HANK ZOMPA AT 4627 10/18/16 SDR    Culture (A)  Final    ESCHERICHIA COLI Confirmed Extended Spectrum Beta-Lactamase Producer (ESBL).  In bloodstream infections from ESBL organisms, carbapenems are preferred over piperacillin/tazobactam. They  are shown to have a lower risk of mortality. Performed at [facility], [place] [address] [place], [place] [address]    Report Status 10/23/2016 FINAL  Final   Organism ID, Bacteria ESCHERICHIA COLI  Final      Susceptibility   Escherichia coli - MIC*    AMPICILLIN >=32 RESISTANT Resistant     CEFAZOLIN >=64 RESISTANT Resistant     CEFEPIME >=64 RESISTANT Resistant     CEFTAZIDIME RESISTANT Resistant     CEFTRIAXONE >=64 RESISTANT Resistant     CIPROFLOXACIN >=4 RESISTANT Resistant     GENTAMICIN <=1 SENSITIVE Sensitive     IMIPENEM <=0.25 SENSITIVE Sensitive     TRIMETH/SULFA >=320 RESISTANT Resistant     AMPICILLIN/SULBACTAM 16 INTERMEDIATE Intermediate     PIP/TAZO <=4 SENSITIVE Sensitive     Extended ESBL POSITIVE Resistant     ERTAPENEM Value in next row Sensitive      SENSITIVE<=0.5    * ESCHERICHIA COLI  Blood Culture ID Panel (Reflexed)     Status: Abnormal   Collection Time: 10/17/16  9:21 PM  Result Value Ref Range Status   Enterococcus species NOT DETECTED NOT DETECTED Final   Listeria monocytogenes NOT DETECTED NOT DETECTED Final   Staphylococcus species NOT DETECTED NOT DETECTED Final   Staphylococcus aureus NOT DETECTED NOT DETECTED Final   Streptococcus species NOT DETECTED NOT DETECTED Final   Streptococcus agalactiae NOT DETECTED NOT DETECTED Final   Streptococcus pneumoniae NOT DETECTED NOT DETECTED Final   Streptococcus pyogenes NOT DETECTED NOT DETECTED Final   Acinetobacter baumannii NOT DETECTED NOT DETECTED Final   Enterobacteriaceae species DETECTED (A) NOT DETECTED Final    Comment:  Enterobacteriaceae represent a large family of gram-negative bacteria, not a single organism. CRITICAL RESULT CALLED TO, READ BACK BY AND VERIFIED WITH:  HANK ZOMPA AT 1610 10/18/16 SDR    Enterobacter cloacae complex NOT DETECTED NOT DETECTED Final   Escherichia coli DETECTED (A) NOT DETECTED Final    Comment: CRITICAL RESULT CALLED TO, READ BACK BY AND VERIFIED  WITH:  HANK ZOMPA AT 9604 10/18/16 SDR    Klebsiella oxytoca NOT DETECTED NOT DETECTED Final   Klebsiella pneumoniae NOT DETECTED NOT DETECTED Final   Proteus species NOT DETECTED NOT DETECTED Final   Serratia marcescens NOT DETECTED NOT DETECTED Final   Carbapenem resistance NOT DETECTED NOT DETECTED Final   Haemophilus influenzae NOT DETECTED NOT DETECTED Final   Neisseria meningitidis NOT DETECTED NOT DETECTED Final   Pseudomonas aeruginosa NOT DETECTED NOT DETECTED Final   Candida albicans NOT DETECTED NOT DETECTED Final   Candida glabrata NOT DETECTED NOT DETECTED Final   Candida krusei NOT DETECTED NOT DETECTED Final   Candida parapsilosis NOT DETECTED NOT DETECTED Final   Candida tropicalis NOT DETECTED NOT DETECTED Final  Urine culture     Status: Abnormal   Collection Time: 10/17/16  9:26 PM  Result Value Ref Range Status   Specimen Description URINE, RANDOM  Final   Special Requests NONE  Final   Culture (A)  Final    >=100,000 COLONIES/mL ESCHERICHIA COLI Confirmed Extended Spectrum Beta-Lactamase Producer (ESBL).  In bloodstream infections from ESBL organisms, carbapenems are preferred over piperacillin/tazobactam. They are shown to have a lower risk of mortality. Performed at [facility], [place] [address] [place], [place] [address]    Report Status 10/23/2016 FINAL  Final   Organism ID, Bacteria ESCHERICHIA COLI (A)  Final      Susceptibility   Escherichia coli - MIC*    AMPICILLIN >=32 RESISTANT Resistant     CEFAZOLIN >=64 RESISTANT Resistant     CEFTRIAXONE >=64 RESISTANT Resistant     CIPROFLOXACIN >=4 RESISTANT Resistant     GENTAMICIN <=1 SENSITIVE Sensitive     IMIPENEM <=0.25 SENSITIVE Sensitive     NITROFURANTOIN <=16 SENSITIVE Sensitive     TRIMETH/SULFA >=320 RESISTANT Resistant     AMPICILLIN/SULBACTAM 16 INTERMEDIATE Intermediate     PIP/TAZO <=4 SENSITIVE Sensitive     Extended ESBL POSITIVE Resistant     ERTAPENEM Value in next row Sensitive       SENSITIVE<=0.5    * >=100,000 COLONIES/mL ESCHERICHIA COLI  MRSA PCR Screening     Status: None   Collection Time: 10/18/16  1:32 AM  Result Value Ref Range Status   MRSA by PCR NEGATIVE NEGATIVE Final    Comment:        The GeneXpert MRSA Assay (FDA approved for NASAL specimens only), is one component of a comprehensive MRSA colonization surveillance program. It is not intended to diagnose MRSA infection nor to guide or monitor treatment for MRSA infections.     RADIOLOGY:  No results found.   Management plans discussed with the patient, family and they are in agreement.  CODE STATUS:  Code Status History    Date Active Date Inactive Code Status Order ID Comments User Context   10/18/2016  1:12 AM 10/24/2016  8:41 PM Full Code [id number]  Demetrios Loll, MD Inpatient   07/22/2016 10:50 AM 07/25/2016  8:42 PM Full Code [id number]  Bettey Costa, MD ED    Advance Directive Documentation     Most Recent Value  Type of Advance  Directive  Living will, Healthcare Power of Attorney  Pre-existing out of facility DNR order (yellow form or pink MOST form)  -  "MOST" Form in Place?  -      TOTAL TIME TAKING CARE OF THIS PATIENT: 38  minutes.    Gladstone Lighter M.D on 10/25/2016 at 2:34 PM  Between 7am to 6pm - Pager - [phone]  After 6pm go to [email or web address] - password [email or web address] [facility]  [facility]  Office  [phone]  CC: Primary care physician; Adin Hector, MD   Note: This dictation was prepared with Dragon dictation along with smaller phrase technology. Any transcriptional errors that result from this process are unintentional.

## 2016-11-06 ENCOUNTER — Other Ambulatory Visit: Payer: Self-pay | Admitting: Internal Medicine

## 2016-11-06 DIAGNOSIS — Z1231 Encounter for screening mammogram for malignant neoplasm of breast: Secondary | ICD-10-CM

## 2016-11-15 ENCOUNTER — Ambulatory Visit: Payer: No Typology Code available for payment source | Admitting: Gastroenterology

## 2016-11-20 ENCOUNTER — Encounter: Payer: Self-pay | Admitting: *Deleted

## 2016-11-21 ENCOUNTER — Encounter: Payer: Self-pay | Admitting: *Deleted

## 2016-11-21 ENCOUNTER — Encounter
Admission: RE | Disposition: A | Discharge status: Home / Self Care | Payer: Self-pay | Source: Ambulatory Visit | Attending: Internal Medicine

## 2016-11-21 ENCOUNTER — Ambulatory Visit: Payer: Medicare Other | Admitting: Anesthesiology

## 2016-11-21 ENCOUNTER — Ambulatory Visit
Admission: RE | Admit: 2016-11-21 | Discharge: 2016-11-21 | Disposition: A | Discharge status: Home / Self Care | Payer: Medicare Other | Source: Ambulatory Visit | Attending: Internal Medicine | Admitting: Internal Medicine

## 2016-11-21 DIAGNOSIS — K7460 Unspecified cirrhosis of liver: Secondary | ICD-10-CM | POA: Diagnosis not present

## 2016-11-21 DIAGNOSIS — I8510 Secondary esophageal varices without bleeding: Secondary | ICD-10-CM | POA: Diagnosis not present

## 2016-11-21 DIAGNOSIS — Z794 Long term (current) use of insulin: Secondary | ICD-10-CM | POA: Insufficient documentation

## 2016-11-21 DIAGNOSIS — I10 Essential (primary) hypertension: Secondary | ICD-10-CM | POA: Diagnosis not present

## 2016-11-21 DIAGNOSIS — Z79899 Other long term (current) drug therapy: Secondary | ICD-10-CM | POA: Insufficient documentation

## 2016-11-21 DIAGNOSIS — J449 Chronic obstructive pulmonary disease, unspecified: Secondary | ICD-10-CM | POA: Insufficient documentation

## 2016-11-21 DIAGNOSIS — F419 Anxiety disorder, unspecified: Secondary | ICD-10-CM | POA: Diagnosis not present

## 2016-11-21 DIAGNOSIS — I7300 Raynaud's syndrome without gangrene: Secondary | ICD-10-CM | POA: Insufficient documentation

## 2016-11-21 DIAGNOSIS — K766 Portal hypertension: Secondary | ICD-10-CM | POA: Diagnosis not present

## 2016-11-21 DIAGNOSIS — K3189 Other diseases of stomach and duodenum: Secondary | ICD-10-CM | POA: Diagnosis not present

## 2016-11-21 DIAGNOSIS — E119 Type 2 diabetes mellitus without complications: Secondary | ICD-10-CM | POA: Diagnosis not present

## 2016-11-21 DIAGNOSIS — E785 Hyperlipidemia, unspecified: Secondary | ICD-10-CM | POA: Diagnosis not present

## 2016-11-21 DIAGNOSIS — F329 Major depressive disorder, single episode, unspecified: Secondary | ICD-10-CM | POA: Diagnosis not present

## 2016-11-21 DIAGNOSIS — I8500 Esophageal varices without bleeding: Secondary | ICD-10-CM | POA: Diagnosis present

## 2016-11-21 DIAGNOSIS — Z880 Allergy status to penicillin: Secondary | ICD-10-CM | POA: Insufficient documentation

## 2016-11-21 HISTORY — DX: Cerebral aneurysm, nonruptured: I67.1

## 2016-11-21 HISTORY — DX: Hyperlipidemia, unspecified: E78.5

## 2016-11-21 HISTORY — DX: Unspecified osteoarthritis, unspecified site: M19.90

## 2016-11-21 HISTORY — PX: ESOPHAGOGASTRODUODENOSCOPY (EGD) WITH PROPOFOL: SHX5813

## 2016-11-21 HISTORY — DX: Nonrheumatic mitral (valve) insufficiency: I34.0

## 2016-11-21 HISTORY — DX: Anxiety disorder, unspecified: F41.9

## 2016-11-21 HISTORY — DX: Depression, unspecified: F32.A

## 2016-11-21 HISTORY — DX: Major depressive disorder, single episode, unspecified: F32.9

## 2016-11-21 HISTORY — DX: Raynaud's syndrome without gangrene: I73.00

## 2016-11-21 HISTORY — DX: Diffuse cystic mastopathy of unspecified breast: N60.19

## 2016-11-21 LAB — GLUCOSE, CAPILLARY: GLUCOSE-CAPILLARY: 195 mg/dL — AB (ref 65–99)

## 2016-11-21 SURGERY — ESOPHAGOGASTRODUODENOSCOPY (EGD) WITH PROPOFOL
Anesthesia: General

## 2016-11-21 MED ORDER — FENTANYL CITRATE (PF) 100 MCG/2ML IJ SOLN
INTRAMUSCULAR | Status: DC | PRN
  Administered 2016-11-21 (×2): 50 ug via INTRAVENOUS

## 2016-11-21 MED ORDER — FENTANYL CITRATE (PF) 100 MCG/2ML IJ SOLN
INTRAMUSCULAR | Status: AC
  Filled 2016-11-21: qty 2

## 2016-11-21 MED ORDER — MIDAZOLAM HCL 2 MG/2ML IJ SOLN
INTRAMUSCULAR | Status: AC
  Filled 2016-11-21: qty 2

## 2016-11-21 MED ORDER — MIDAZOLAM HCL 2 MG/2ML IJ SOLN
INTRAMUSCULAR | Status: DC | PRN
Start: 2016-11-21 — End: 2016-11-21
  Administered 2016-11-21 (×2): 1 mg via INTRAVENOUS

## 2016-11-21 MED ORDER — PROPOFOL 500 MG/50ML IV EMUL
INTRAVENOUS | Status: AC
  Filled 2016-11-21: qty 50

## 2016-11-21 MED ORDER — LIDOCAINE HCL (PF) 2 % IJ SOLN
INTRAMUSCULAR | Status: AC
  Filled 2016-11-21: qty 10

## 2016-11-21 MED ORDER — PROPOFOL 500 MG/50ML IV EMUL
INTRAVENOUS | Status: DC | PRN
  Administered 2016-11-21: 120 ug/kg/min via INTRAVENOUS

## 2016-11-21 MED ORDER — PROPOFOL 10 MG/ML IV BOLUS
INTRAVENOUS | Status: DC | PRN
  Administered 2016-11-21: 30 mg via INTRAVENOUS

## 2016-11-21 MED ORDER — SODIUM CHLORIDE 0.9 % IV SOLN
INTRAVENOUS | Status: DC
  Administered 2016-11-21: 1000 mL via INTRAVENOUS

## 2016-11-21 NOTE — Anesthesia Preprocedure Evaluation (Signed)
Anesthesia Evaluation  Patient identified by MRN, date of birth, ID band Patient awake    Reviewed: Allergy & Precautions, NPO status , Patient's Chart, lab work & pertinent test results  Airway Mallampati: II       Dental  (+) Teeth Intact   Pulmonary COPD, Current Smoker,     + decreased breath sounds      Cardiovascular Exercise Tolerance: Good hypertension, Pt. on medications + Peripheral Vascular Disease   Rhythm:Regular Rate:Normal     Neuro/Psych Anxiety Depression    GI/Hepatic negative GI ROS, Neg liver ROS,   Endo/Other  diabetes, Type 1, Insulin DependentMorbid obesity  Renal/GU      Musculoskeletal   Abdominal (+) + obese,   Peds negative pediatric ROS (+)  Hematology   Anesthesia Other Findings   Reproductive/Obstetrics                             Anesthesia Physical Anesthesia Plan  ASA: III  Anesthesia Plan: General   Post-op Pain Management:    Induction: Intravenous  PONV Risk Score and Plan: 0  Airway Management Planned: Natural Airway and Nasal Cannula  Additional Equipment:   Intra-op Plan:   Post-operative Plan:   Informed Consent: I have reviewed the patients History and Physical, chart, labs and discussed the procedure including the risks, benefits and alternatives for the proposed anesthesia with the patient or authorized representative who has indicated his/her understanding and acceptance.     Plan Discussed with: Surgeon  Anesthesia Plan Comments:         Anesthesia Quick Evaluation

## 2016-11-21 NOTE — Interval H&P Note (Signed)
History and Physical Interval Note:  11/21/2016 8:26 AM  Vanessa Oneill  has presented today for surgery, with the diagnosis of ESOPHAGEAL VARICES  The various methods of treatment have been discussed with the patient and family. After consideration of risks, benefits and other options for treatment, the patient has consented to  Procedure(s): ESOPHAGOGASTRODUODENOSCOPY (EGD) WITH PROPOFOL (N/A) as a surgical intervention .  The patient's history has been reviewed, patient examined, no change in status, stable for surgery.  I have reviewed the patient's chart and labs.  Questions were answered to the patient's satisfaction.     [place], [place]

## 2016-11-21 NOTE — Transfer of Care (Signed)
Immediate Anesthesia Transfer of Care Note  Patient: Vanessa Oneill  Procedure(s) Performed: ESOPHAGOGASTRODUODENOSCOPY (EGD) WITH PROPOFOL (N/A )  Patient Location: PACU  Anesthesia Type:General  Level of Consciousness: awake  Airway & Oxygen Therapy: Patient Spontanous Breathing and Patient connected to nasal cannula oxygen  Post-op Assessment: Report given to RN and Post -op Vital signs reviewed and stable  Post vital signs: Reviewed  Last Vitals:  Vitals:   11/21/16 0709  BP: 112/69  Pulse: 70  Resp: 20  Temp: (!) 36.1 C  SpO2: 99%    Last Pain:  Vitals:   11/21/16 0709  TempSrc: Tympanic         Complications: No apparent anesthesia complications

## 2016-11-21 NOTE — Op Note (Signed)
[facility] Gastroenterology Patient Name: Vanessa Oneill Procedure Date: 11/21/2016 7:48 AM MRN: [id number] Account #: [id number] Date of Birth: [birth year] Admit Type: Outpatient Age: 66 Room: [facility] ENDO ROOM 1 Gender: Female Note Status: Finalized Procedure:            Upper GI endoscopy Indications:          For therapy of esophageal varices in patient with                        suspected portal hypertension Providers:            Benay Pike. [place] MD, MD Medicines:            Propofol per Anesthesia Complications:        No immediate complications. Procedure:            Pre-Anesthesia Assessment:                       - The risks and benefits of the procedure and the                        sedation options and risks were discussed with the                        patient. All questions were answered and informed                        consent was obtained.                       - Patient identification and proposed procedure were                        verified prior to the procedure by the nurse. The                        procedure was verified in the procedure room.                       - ASA Grade Assessment: III - A patient with severe                        systemic disease.                       - After reviewing the risks and benefits, the patient                        was deemed in satisfactory condition to undergo the                        procedure.                       After obtaining informed consent, the endoscope was                        passed under direct vision. Throughout the procedure,                        the patient's blood pressure, pulse, and oxygen  saturations were monitored continuously. The Endoscope                        was introduced through the mouth, and advanced to the                        third part of duodenum. The upper GI endoscopy was                        accomplished without difficulty.  The patient tolerated                        the procedure well. Findings:      Two columns of non-bleeding grade II varices were found in the lower       third of the esophagus, 36 cm from the incisors. They were 10 mm in       largest diameter. No stigmata of recent bleeding were evident and no red       wale signs were present. Scarring from prior treatment was visible.       Evidence of partial eradication was visible. The varices appeared       smaller than they were at prior exam. Two bands were successfully placed       with complete eradication, resulting in deflation of varices. There was       no bleeding at the end of the procedure. Estimated blood loss: none.      Mild portal hypertensive gastropathy was found in the stomach.      The cardia and gastric fundus were normal on retroflexion.      The examined duodenum was normal. Impression:           - Non-bleeding grade II esophageal varices. Completely                        eradicated. Banded.                       - Portal hypertensive gastropathy.                       - Normal examined duodenum.                       - No specimens collected. Recommendation:       - Patient has a contact number available for                        emergencies. The signs and symptoms of potential                        delayed complications were discussed with the patient.                        Return to normal activities tomorrow. Written discharge                        instructions were provided to the patient.                       - Resume previous diet.                       -  Continue present medications.                       - No aspirin, ibuprofen, naproxen, or other                        non-steroidal anti-inflammatory drugs EVER AGAIN.                       - Repeat upper endoscopy in 3 months for retreatment.                       - Return to GI office in 2 months.                       - Schedule with PA or NP-- [phone]                       - The findings and recommendations were discussed with                        the patient.                       - The findings and recommendations were discussed with                        the designated responsible adult. Procedure Code(s):    --- Professional ---                       [phone], Esophagogastroduodenoscopy, flexible, transoral;                        with band ligation of esophageal/gastric varices Diagnosis Code(s):    --- Professional ---                       I85.00, Esophageal varices without bleeding                       K76.6, Portal hypertension                       K31.89, Other diseases of stomach and duodenum CPT copyright 2016 American Medical Association. All rights reserved. The codes documented in this report are preliminary and upon coder review may  be revised to meet current compliance requirements. Efrain Sella MD, MD 11/21/2016 8:41:32 AM This report has been signed electronically. Number of Addenda: 0 Note Initiated On: 11/21/2016 7:48 AM      [facility]

## 2016-11-21 NOTE — H&P (Signed)
Outpatient short stay form Pre-procedure 11/21/2016 8:23 AM Mika Griffitts K. Alice Reichert, M.D.  Primary Physician:Bert Caryl Comes, M.D.  Reason for visit:  Esophageal varices  History of present illness:  Esophageal varices hx s/p EGD with 6 bands by Dr. Vicente Males, July 24 2017. Denies hemetemsis, melena, dysphagia.      Current Facility-Administered Medications:  .  0.9 %  sodium chloride infusion, , Intravenous, Continuous, [place], Benay Pike, MD, Last Rate: 20 mL/hr at 11/21/16 1962, 1,000 mL at 11/21/16 2297  Medications Prior to Admission  Medication Sig Dispense Refill Last Dose  . glimepiride (AMARYL) 4 MG tablet Take 4 mg daily with breakfast by mouth.     . hydrochlorothiazide (HYDRODIURIL) 25 MG tablet Take 25 mg daily by mouth.     . losartan (COZAAR) 100 MG tablet Take 100 mg daily by mouth.     . nadolol (CORGARD) 40 MG tablet Take 1 tablet (40 mg total) by mouth daily. 30 tablet 2 11/20/2016 at Unknown time  . ALPRAZolam (XANAX) 0.25 MG tablet Take 0.25 mg by mouth at bedtime as needed for sleep.  5 prn at prn  . cyclobenzaprine (FLEXERIL) 10 MG tablet Take 10 mg by mouth 3 (three) times daily as needed for muscle spasms.   Past Month at Unknown time  . ertapenem 1 g in sodium chloride 0.9 % 50 mL Inject 1 g into the vein daily. X 10 days (Patient not taking: Reported on 11/20/2016) 12 g 0 Not Taking at Unknown time  . feeding supplement, ENSURE ENLIVE, (ENSURE ENLIVE) LIQD Take 237 mLs by mouth 2 (two) times daily between meals. 237 mL 12   . gabapentin (NEURONTIN) 100 MG capsule Take 100 mg by mouth 2 (two) times daily.   Past Month at Unknown time  . HYDROcodone-acetaminophen (NORCO/VICODIN) 5-325 MG tablet Take 1 tablet by mouth every 6 (six) hours as needed for moderate pain.   Past Week at prn  . insulin NPH Human (HUMULIN N,NOVOLIN N) 100 UNIT/ML injection Inject 30 Units into the skin 2 (two) times daily before a meal. Use 30 units in the morning, 20 units during the day and 30 units at  night.   Past Month at Unknown time  . metFORMIN (GLUCOPHAGE-XR) 500 MG 24 hr tablet Take 1,000 mg by mouth daily.   10/17/2016 at Unknown time  . pantoprazole (PROTONIX) 40 MG tablet Take 40 mg by mouth 2 (two) times daily.    Past Month at Unknown time  . rOPINIRole (REQUIP) 1 MG tablet Take 1 mg by mouth at bedtime.    Past Month at Unknown time  . traZODone (DESYREL) 50 MG tablet Take 50 mg by mouth at bedtime as needed for sleep.  0 Past Month at Unknown time  . [DISCONTINUED] escitalopram (LEXAPRO) 20 MG tablet Take 10 mg by mouth daily.  11 10/17/2016 at Unknown time  . [DISCONTINUED] nystatin (MYCOSTATIN) 100000 UNIT/ML suspension Use as directed 5 mLs (500,000 Units total) in the mouth or throat 4 (four) times daily. 60 mL 0   . [DISCONTINUED] sucralfate (CARAFATE) 1 GM/10ML suspension Take 10 mLs (1 g total) by mouth 4 (four) times daily as needed (chest discomfort). 420 mL 0 prn at prn     Allergies  Allergen Reactions  . Codeine   . Lipitor [Atorvastatin]   . Lisinopril   . Penicillins   . Tylenol With Codeine #3 [Acetaminophen-Codeine]   . Amoxicillin Itching and Rash     Past Medical History:  Diagnosis Date  .  Anxiety   . Arthritis   . Cerebral aneurysm   . COPD (chronic obstructive pulmonary disease) ([place])   . Depression   . Diabetes mellitus without complication ([place])   . Fibrocystic breast disease   . Hyperlipidemia   . Hypertension   . Mitral valve regurgitation   . 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 Personal history of tobacco use, presenting hazards to health 11/12/2014  . Raynaud's disease     Review of systems:      Physical Exam  General appearance: cooperative Resp: clear to auscultation bilaterally Cardio: regular rate and rhythm, S1, S2 normal, no murmur, click, rub or gallop GI: soft, non-tender; bowel sounds normal; no masses,  no organomegaly     Planned procedures: EGD with variceal banding. The patient understands the nature of the  planned procedure, indications, risks, alternatives and potential complications including but not limited to bleeding, infection, perforation, damage to internal organs and possible oversedation/side effects from anesthesia. The patient agrees and gives consent to proceed.  Please refer to procedure notes for findings, recommendations and patient disposition/instructions.    Sonali Wivell K. Alice Reichert, M.D. Gastroenterology 11/21/2016  8:23 AM

## 2016-11-21 NOTE — Anesthesia Post-op Follow-up Note (Signed)
Anesthesia QCDR form completed.        

## 2016-11-21 NOTE — Anesthesia Postprocedure Evaluation (Signed)
Anesthesia Post Note  Patient: Vanessa Oneill  Procedure(s) Performed: ESOPHAGOGASTRODUODENOSCOPY (EGD) WITH PROPOFOL (N/A )  Patient location during evaluation: PACU Anesthesia Type: General Level of consciousness: awake Pain management: pain level controlled Vital Signs Assessment: post-procedure vital signs reviewed and stable Respiratory status: spontaneous breathing Cardiovascular status: stable Anesthetic complications: no     Last Vitals:  Vitals:   11/21/16 0910 11/21/16 0920  BP: 117/68 116/68  Pulse: 68 66  Resp: 20 20  Temp:    SpO2: 96% 97%    Last Pain:  Vitals:   11/21/16 0840  TempSrc: Tympanic                 VAN STAVEREN,Joyce Heitman

## 2016-11-21 NOTE — OR Nursing (Signed)
PT. Having a lot of saliva, spitting in basin. Adomen distented. Not Painful passing some air by belching. DR Alice Reichert advised pt to drink clear liquids today and call if any other problems.

## 2016-11-22 ENCOUNTER — Encounter: Payer: Self-pay | Admitting: Internal Medicine

## 2016-11-28 ENCOUNTER — Emergency Department
Admission: EM | Admit: 2016-11-28 | Discharge: 2016-11-29 | Disposition: A | Discharge status: Home / Self Care | Payer: Medicare Other | Attending: Emergency Medicine | Admitting: Emergency Medicine

## 2016-11-28 ENCOUNTER — Encounter: Payer: Self-pay | Admitting: Emergency Medicine

## 2016-11-28 DIAGNOSIS — N12 Tubulo-interstitial nephritis, not specified as acute or chronic: Secondary | ICD-10-CM

## 2016-11-28 DIAGNOSIS — E871 Hypo-osmolality and hyponatremia: Secondary | ICD-10-CM | POA: Diagnosis not present

## 2016-11-28 DIAGNOSIS — I10 Essential (primary) hypertension: Secondary | ICD-10-CM | POA: Diagnosis not present

## 2016-11-28 DIAGNOSIS — E119 Type 2 diabetes mellitus without complications: Secondary | ICD-10-CM | POA: Diagnosis not present

## 2016-11-28 DIAGNOSIS — J449 Chronic obstructive pulmonary disease, unspecified: Secondary | ICD-10-CM | POA: Insufficient documentation

## 2016-11-28 DIAGNOSIS — M545 Low back pain: Secondary | ICD-10-CM | POA: Insufficient documentation

## 2016-11-28 DIAGNOSIS — R112 Nausea with vomiting, unspecified: Secondary | ICD-10-CM | POA: Insufficient documentation

## 2016-11-28 DIAGNOSIS — Z880 Allergy status to penicillin: Secondary | ICD-10-CM | POA: Diagnosis not present

## 2016-11-28 DIAGNOSIS — Z885 Allergy status to narcotic agent status: Secondary | ICD-10-CM | POA: Insufficient documentation

## 2016-11-28 DIAGNOSIS — F509 Eating disorder, unspecified: Secondary | ICD-10-CM | POA: Insufficient documentation

## 2016-11-28 DIAGNOSIS — R300 Dysuria: Secondary | ICD-10-CM | POA: Diagnosis not present

## 2016-11-28 DIAGNOSIS — F17210 Nicotine dependence, cigarettes, uncomplicated: Secondary | ICD-10-CM | POA: Insufficient documentation

## 2016-11-28 DIAGNOSIS — R350 Frequency of micturition: Secondary | ICD-10-CM | POA: Diagnosis present

## 2016-11-28 LAB — BASIC METABOLIC PANEL
Anion gap: 10 (ref 5–15)
BUN: 15 mg/dL (ref 6–20)
CALCIUM: 7.7 mg/dL — AB (ref 8.9–10.3)
CO2: 20 mmol/L — ABNORMAL LOW (ref 22–32)
CREATININE: 0.88 mg/dL (ref 0.44–1.00)
Chloride: 98 mmol/L — ABNORMAL LOW (ref 101–111)
Glucose, Bld: 318 mg/dL — ABNORMAL HIGH (ref 65–99)
Potassium: 3.6 mmol/L (ref 3.5–5.1)
SODIUM: 128 mmol/L — AB (ref 135–145)

## 2016-11-28 LAB — COMPREHENSIVE METABOLIC PANEL
ALBUMIN: 3.4 g/dL — AB (ref 3.5–5.0)
ALT: 20 U/L (ref 14–54)
AST: 28 U/L (ref 15–41)
Alkaline Phosphatase: 104 U/L (ref 38–126)
Anion gap: 10 (ref 5–15)
BUN: 18 mg/dL (ref 6–20)
CHLORIDE: 95 mmol/L — AB (ref 101–111)
CO2: 21 mmol/L — AB (ref 22–32)
Calcium: 8.4 mg/dL — ABNORMAL LOW (ref 8.9–10.3)
Creatinine, Ser: 0.98 mg/dL (ref 0.44–1.00)
GFR calc Af Amer: >60 mL/min (ref >60)
GFR, EST NON AFRICAN AMERICAN: 59 mL/min — AB (ref >60)
GLUCOSE: 372 mg/dL — AB (ref 65–99)
POTASSIUM: 4 mmol/L (ref 3.5–5.1)
SODIUM: 126 mmol/L — AB (ref 135–145)
Total Bilirubin: 1.2 mg/dL (ref 0.3–1.2)
Total Protein: 7.1 g/dL (ref 6.5–8.1)

## 2016-11-28 LAB — CBC
HEMATOCRIT: 26.4 % — AB (ref 35.0–47.0)
Hemoglobin: 8.5 g/dL — ABNORMAL LOW (ref 12.0–16.0)
MCH: 22.7 pg — ABNORMAL LOW (ref 26.0–34.0)
MCHC: 32.4 g/dL (ref 32.0–36.0)
MCV: 70.2 fL — AB (ref 80.0–100.0)
Platelets: 68 10*3/uL — ABNORMAL LOW (ref 150–440)
RBC: 3.76 MIL/uL — ABNORMAL LOW (ref 3.80–5.20)
RDW: 20.5 % — ABNORMAL HIGH (ref 11.5–14.5)
WBC: 4.7 10*3/uL (ref 3.6–11.0)

## 2016-11-28 LAB — URINALYSIS, COMPLETE (UACMP) WITH MICROSCOPIC
BACTERIA UA: NONE SEEN
BILIRUBIN URINE: NEGATIVE
Glucose, UA: >=500 mg/dL — AB
Hgb urine dipstick: NEGATIVE
KETONES UR: NEGATIVE mg/dL
LEUKOCYTES UA: NEGATIVE
Nitrite: NEGATIVE
Protein, ur: 30 mg/dL — AB
SPECIFIC GRAVITY, URINE: 1.02 (ref 1.005–1.030)
pH: 5 (ref 5.0–8.0)

## 2016-11-28 LAB — LIPASE, BLOOD: LIPASE: 24 U/L (ref 11–51)

## 2016-11-28 MED ORDER — FENTANYL CITRATE (PF) 100 MCG/2ML IJ SOLN
50.0000 ug | Freq: Once | INTRAMUSCULAR | Status: AC
  Administered 2016-11-28: 50 ug via INTRAVENOUS
  Filled 2016-11-28: qty 2

## 2016-11-28 MED ORDER — ONDANSETRON 4 MG PO TBDP: 4.0000 mg | ORAL_TABLET | Freq: Once | ORAL | Status: DC

## 2016-11-28 MED ORDER — SODIUM CHLORIDE 0.9 % IV BOLUS (SEPSIS)
1000.0000 mL | Freq: Once | INTRAVENOUS | Status: AC
  Administered 2016-11-28: 1000 mL via INTRAVENOUS

## 2016-11-28 MED ORDER — KETOROLAC TROMETHAMINE 60 MG/2ML IM SOLN: 60.0000 mg | Freq: Once | INTRAMUSCULAR | Status: DC

## 2016-11-28 MED ORDER — KETOROLAC TROMETHAMINE 30 MG/ML IJ SOLN
30.0000 mg | Freq: Once | INTRAMUSCULAR | Status: AC
  Administered 2016-11-28: 30 mg via INTRAVENOUS
  Filled 2016-11-28: qty 1

## 2016-11-28 MED ORDER — ONDANSETRON HCL 4 MG/2ML IJ SOLN
4.0000 mg | Freq: Once | INTRAMUSCULAR | Status: AC
  Administered 2016-11-28: 4 mg via INTRAVENOUS
  Filled 2016-11-28: qty 2

## 2016-11-28 NOTE — Discharge Instructions (Signed)
Please continue the entire course of antibiotics, even if you are feeling better.  You may continue to take your Zofran for nausea or vomiting, and Ibuprofen for pain, discomfort or fever.  Return to the emergency department for severe pain, inability to keep down fluids, fever, or any other symptoms concerning to you.

## 2016-11-28 NOTE — ED Triage Notes (Signed)
Pt c/o lower bilateral back pain, urinary frequency and burning x4 days. Pt reports she was seen at PCP on Monday and had antibiotic shot as well as antibiotic to take home. Pt to ED tonight due to increase in pain and no relief in symptoms.

## 2016-11-28 NOTE — ED Notes (Signed)
Lab drawn per Dr Mariea Clonts request to draw now; approx 1L IVFs has been admin and MD aware

## 2016-11-28 NOTE — ED Provider Notes (Signed)
[facility] Emergency Department Provider Note  ____________________________________________  Time seen: Approximately 9:18 PM  I have reviewed the triage vital signs and the nursing notes.   HISTORY  Chief Complaint Urinary Frequency; Back Pain; and Fever    HPI Vanessa Oneill is a 66 y.o. female with a history of recurrent UTI presenting with dysuria, urinary frequency, low back discomfort, and nausea and vomiting.  The patient reports that her symptoms started yesterday and she was seen by her primary care physician, given an antibiotic shot in the office, and started on Macrobid.  She took one ibuprofen at night for a fever of 102, but has not tried any other medications for pain.  She has had 2 episodes of vomiting yesterday and one episode today, which resolved with Zofran which she has at home.  The patient denies any diarrhea or constipation, abdominal pain, lightheadedness or syncope.   Past Medical History:  Diagnosis Date  . Anxiety   . Arthritis   . Cerebral aneurysm   . COPD (chronic obstructive pulmonary disease) ([place])   . Depression   . Diabetes mellitus without complication ([place])   . Fibrocystic breast disease   . Hyperlipidemia   . Hypertension   . Mitral valve regurgitation   . 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 Personal history of tobacco use, presenting hazards to health 11/12/2014  . Raynaud's disease     Patient Active Problem List   Diagnosis Date Noted  . Septic shock ([place]) 10/18/2016  . Urinary tract infection without hematuria   . Respiratory distress   . Sepsis ([place]) 10/17/2016  . GIB (gastrointestinal bleeding) 07/22/2016  . Personal history of tobacco use, presenting hazards to health 11/12/2014    Past Surgical History:  Procedure Laterality Date  . ABDOMINAL HYSTERECTOMY    . APPENDECTOMY    . BREAST BIOPSY Right    neg  . CHOLECYSTECTOMY    . ESOPHAGOGASTRODUODENOSCOPY (EGD) WITH PROPOFOL N/A 07/24/2016   Procedure: ESOPHAGOGASTRODUODENOSCOPY (EGD) WITH PROPOFOL;  Surgeon: Jonathon Bellows, MD;  Location: [facility] ENDOSCOPY;  Service: Endoscopy;  Laterality: N/A;  . ESOPHAGOGASTRODUODENOSCOPY (EGD) WITH PROPOFOL N/A 11/21/2016   Procedure: ESOPHAGOGASTRODUODENOSCOPY (EGD) WITH PROPOFOL;  Surgeon: [place], Benay Pike, MD;  Location: [facility] ENDOSCOPY;  Service: Gastroenterology;  Laterality: N/A;  . TONSILLECTOMY      Current Outpatient Rx  . Order #: [id number] Class: Historical Med  . Order #: [id number] Class: Historical Med  . Order #: [id number] Class: Normal  . Order #: [id number] Class: Normal  . Order #: [id number] Class: Historical Med  . Order #: [id number] Class: Historical Med  . Order #: [id number] Class: Historical Med  . Order #: [id number] Class: Historical Med  . Order #: [id number] Class: Historical Med  . Order #: [id number] Class: Historical Med  . Order #: [id number] Class: Historical Med  . Order #: [id number] Class: Normal  . Order #: [id number] Class: Historical Med  . Order #: [id number] Class: Historical Med  . Order #: [id number] Class: Historical Med    Allergies Codeine; Lipitor [atorvastatin]; Lisinopril; Penicillins; Tylenol with codeine #3 [acetaminophen-codeine]; and Amoxicillin  Family History  Problem Relation Age of Onset  . Lung cancer Father     Social History Social History   Tobacco Use  . Smoking status: Current Every Day Smoker    Packs/day: 1.00    Years: 48.00    Pack years: 48.00    Types: Cigarettes  . Smokeless tobacco: Never Used  Substance Use Topics  . Alcohol use: No    Alcohol/week: 0.0 oz  . Drug use: No  Review of Systems Constitutional: Positive fever and general malaise.  No lightheadedness or syncope.. Eyes: No visual changes. ENT: No sore throat. No congestion or rhinorrhea. Cardiovascular: Denies chest pain. Denies palpitations. Respiratory: Denies shortness of breath.  No cough. Gastrointestinal: No abdominal pain.  No nausea, no vomiting.  No  diarrhea.  No constipation. Genitourinary: Positive for for dysuria and urinary frequency. Musculoskeletal: Positive for low back back pain bilaterally. Skin: Negative for rash. Neurological: Negative for headaches. No focal numbness, tingling or weakness.     ____________________________________________   PHYSICAL EXAM:  VITAL SIGNS: ED Triage Vitals  Enc Vitals Group     BP 11/28/16 1903 (!) 102/58     Pulse Rate 11/28/16 1903 93     Resp 11/28/16 1903 16     Temp 11/28/16 1903 98.4 F (36.9 C)     Temp Source 11/28/16 1903 Oral     SpO2 11/28/16 1903 98 %     Weight 11/28/16 1904 193 lb (87.5 kg)     Height --      Head Circumference --      Peak Flow --      Pain Score 11/28/16 2038 10     Pain Loc --      Pain Edu? --      Excl. in [place]? --     Constitutional: Alert and oriented. Well appearing and in no acute distress. Answers questions appropriately. Eyes: Conjunctivae are normal.  EOMI. No scleral icterus. Head: Atraumatic. Nose: No congestion/rhinnorhea. Mouth/Throat: Mucous membranes are dry.  Neck: No stridor.  Supple.   Cardiovascular: Normal rate, regular rhythm. No murmurs, rubs or gallops.  Respiratory: Normal respiratory effort.  No accessory muscle use or retractions. Lungs CTAB.  No wheezes, rales or ronchi. Gastrointestinal: Soft, nontender and nondistended.  No guarding or rebound.  No peritoneal signs. Musculoskeletal: The patient does have some mild bilateral CVA tenderness to palpation which is equal. Neurologic:  A&Ox3.  Speech is clear.  Face and smile are symmetric.  EOMI.  Moves all extremities well. Skin:  Skin is warm, dry and intact. No rash noted. Psychiatric: Mood and affect are normal. Speech and behavior are normal.  Normal judgement.  ____________________________________________   LABS (all labs ordered are listed, but only abnormal results are displayed)  Labs Reviewed  COMPREHENSIVE METABOLIC PANEL - Abnormal; Notable for the  following components:      Result Value   Sodium 126 (*)    Chloride 95 (*)    CO2 21 (*)    Glucose, Bld 372 (*)    Calcium 8.4 (*)    Albumin 3.4 (*)    GFR calc non Af Amer 59 (*)    All other components within normal limits  CBC - Abnormal; Notable for the following components:   RBC 3.76 (*)    Hemoglobin 8.5 (*)    HCT 26.4 (*)    MCV 70.2 (*)    MCH 22.7 (*)    RDW 20.5 (*)    Platelets 68 (*)    All other components within normal limits  URINALYSIS, COMPLETE (UACMP) WITH MICROSCOPIC - Abnormal; Notable for the following components:   Color, Urine YELLOW (*)    APPearance HAZY (*)    Glucose, UA >=500 (*)    Protein, ur 30 (*)    Squamous Epithelial / LPF 0-5 (*)    All other components within normal limits  URINE CULTURE  LIPASE, BLOOD  BASIC METABOLIC PANEL   ____________________________________________  EKG  Not indicated ____________________________________________  RADIOLOGY  No results found.  ____________________________________________   PROCEDURES  Procedure(s) performed: None  Procedures  Critical Care performed: No ____________________________________________   INITIAL IMPRESSION / ASSESSMENT AND PLAN / ED COURSE  Pertinent labs & imaging results that were available during my care of the patient were reviewed by me and considered in my medical decision making (see chart for details).  66 y.o. female under treatment for UTI with Macrobid presenting with ongoing low back pain, nausea and vomiting.  Overall, the patient is hemodynamically stable and afebrile here.  She has treated her symptoms with a single dose of ibuprofen which was last night, and a single dose of Zofran.  It is likely that she is under treating her symptoms.  Renal colic is also possible but less likely given the bilateral nature of her discomfort.  Her urinalysis today is reassuring that her antibiotics are improving the bacterial load in her urine.  I have reviewed her  previous microbiology's, and she does have Macrobid sensitive E. coli from 10/17/16.  The patient's laboratory studies are suggestive of some mild dehydration she has a small bump in her creatinine although her creatinine remains normal, and her sodium today is 126.  I will plan to give her a liter of fluid, and an antiemetic, and have her continue her Macrobid.  If her symptoms improve, the patient will have a repeat BMP to evaluate her sodium, and I anticipate discharge home with close PMD follow-up.  ----------------------------------------- 10:38 PM on 11/28/2016 -----------------------------------------  At this time, the patient is feeling significantly better.  We will recheck her sodium, and if it is reassuring, will plan to discharge her home with instructions to use her antiemetic and her NSAID medications for her symptoms.  Follow up instructions and return precautions were discussed.  ____________________________________________  FINAL CLINICAL IMPRESSION(S) / ED DIAGNOSES  Final diagnoses:  Hyponatremia  Pyelonephritis         NEW MEDICATIONS STARTED DURING THIS VISIT:  This SmartLink is deprecated. Use AVSMEDLIST instead to display the medication list for a patient.    Eula Listen, MD 11/28/16 2239

## 2016-11-28 NOTE — ED Notes (Signed)
Pt uprite on stretcher in exam room with no distress noted, SO at bedside; pt reports seen by PCP yesterday and received antibiotic injection then rx macrobid which she began for UTI; c/o increasing lower back pain and dysuria; denies any abd pain; reports pain 10/10 ache to back

## 2016-11-30 LAB — URINE CULTURE: Culture: <10000 — AB

## 2016-12-12 ENCOUNTER — Ambulatory Visit
Admission: RE | Admit: 2016-12-12 | Discharge: 2016-12-12 | Disposition: A | Discharge status: Home / Self Care | Payer: Medicare Other | Source: Ambulatory Visit | Attending: Internal Medicine | Admitting: Internal Medicine

## 2016-12-12 DIAGNOSIS — Z1231 Encounter for screening mammogram for malignant neoplasm of breast: Secondary | ICD-10-CM | POA: Diagnosis present

## 2016-12-13 NOTE — Progress Notes (Signed)
12/14/2016 12:27 PM   Vanessa Oneill Oct 28, 1950 [id number]  Referring provider: Adin Hector, MD [name] [place], [place] [address]  Chief Complaint  Patient presents with  . Urinary Tract Infection    HPI: Patient is a 66 -year-old DM Caucasian female who is referred to [name] by, [name], PA, for recurrent urinary tract infections.  Patient states that she has had two or three urinary tract infections over the last year.  Reviewing her records,  she has had five documented UTI's over the last 2 years.  Resistant patterns are increasing.  Has been positive was ESBL.  Admitted in 10/2016 for UTI with sepsis.    Her symptoms with a urinary tract infection consist of gross hematuria, frequency, urgency and suprapubic pressure.    Today, she is complaining frequency, mild dysuria, vaginal itching, nocturia and incontinence.  She is having LBP today and nausea as well.  She denies recent gross hematuria, suprapubic pain, abdominal pain or flank pain.  She has not had any recent fevers, chills, nausea or vomiting.  Her UA today is negative, but she was pushing water to have enough urine for culture.    She does not have a history of nephrolithiasis, GU surgery or GU trauma.   She is sexually active.  She has not noted a correlation with her urinary tract infections and sexual intercourse.   She does not engage in anal sex.   She is voiding before and after sex.   She is postmenopausal.   She was prescribed vaginal estrogen cream, but she found it cost prohibitive.    She admits to diarrhea.   She does engage in good perineal hygiene. She does not take tub baths.    She has urge incontinence.  She is using incontinence pads daily, started this four years ago.    RUS in 10/2016 was normal.    She is drinking a lot of water daily.   She is also eating ice, due to iron deficiency.   Two to three cups of coffee daily.   She is a smoker.     Reviewed referral notes.    - she has seen ID and was started on estrogen vaginal cream and 30 days of daily Macrobid suppression therapy  PMH: Past Medical History:  Diagnosis Date  . Acid reflux   . Anxiety   . Arthritis   . Cerebral aneurysm   . COPD (chronic obstructive pulmonary disease) ([place])   . Depression   . Diabetes mellitus without complication ([place])   . Fibrocystic breast disease   . Hyperlipidemia   . Hypertension   . Liver disease   . Mitral valve regurgitation   . 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[place])   . Personal history of tobacco use, presenting hazards to health 11/12/2014  . Raynaud's disease   . Sleep apnea     Surgical History: Past Surgical History:  Procedure Laterality Date  . ABDOMINAL HYSTERECTOMY    . APPENDECTOMY    . BREAST BIOPSY Right    neg- core  . CHOLECYSTECTOMY    . ESOPHAGOGASTRODUODENOSCOPY (EGD) WITH PROPOFOL N/A 07/24/2016   Procedure: ESOPHAGOGASTRODUODENOSCOPY (EGD) WITH PROPOFOL;  Surgeon: Jonathon Bellows, MD;  Location: [facility] ENDOSCOPY;  Service: Endoscopy;  Laterality: N/A;  . ESOPHAGOGASTRODUODENOSCOPY (EGD) WITH PROPOFOL N/A 11/21/2016   Procedure: ESOPHAGOGASTRODUODENOSCOPY (EGD) WITH PROPOFOL;  Surgeon: Tol[place], Benay Pike, MD;  Location: [facility] ENDOSCOPY;  Service: Gastroenterology;  Laterality: N/A;  . TONSILLECTOMY  Home Medications:  Allergies as of 12/14/2016      Reactions   Codeine    Lipitor [atorvastatin]    Lisinopril    Penicillins    Tylenol With Codeine #3 [acetaminophen-codeine]    Amoxicillin Itching, Rash      Medication List        Accurate as of 12/14/16 12:27 PM. Always use your most recent med list.          ALPRAZolam 0.25 MG tablet Commonly known as:  XANAX Take 0.25 mg by mouth at bedtime as needed for sleep.   conjugated estrogens vaginal cream Commonly known as:  PREMARIN Apply 0.'5mg'$  (pea-sized amount)  just inside the vaginal introitus with a finger-tip on  Monday, Wednesday and Friday  nights.   cyclobenzaprine 10 MG tablet Commonly known as:  FLEXERIL Take 10 mg by mouth 3 (three) times daily as needed for muscle spasms.   ertapenem 1 g in sodium chloride 0.9 % 50 mL Inject 1 g into the vein daily. X 10 days   feeding supplement (ENSURE ENLIVE) Liqd Take 237 mLs by mouth 2 (two) times daily between meals.   fluconazole 150 MG tablet Commonly known as:  DIFLUCAN Take 1 tablet (150 mg total) by mouth once for 1 dose.   gabapentin 100 MG capsule Commonly known as:  NEURONTIN Take 100 mg by mouth 2 (two) times daily.   glimepiride 4 MG tablet Commonly known as:  AMARYL Take 4 mg daily with breakfast by mouth.   hydrochlorothiazide 25 MG tablet Commonly known as:  HYDRODIURIL Take 25 mg daily by mouth.   HYDROcodone-acetaminophen 5-325 MG tablet Commonly known as:  NORCO/VICODIN Take 1 tablet by mouth every 6 (six) hours as needed for moderate pain.   insulin NPH Human 100 UNIT/ML injection Commonly known as:  HUMULIN N,NOVOLIN N Inject 30 Units into the skin 2 (two) times daily before a meal. Use 30 units in the morning, 20 units during the day and 30 units at night.   losartan 100 MG tablet Commonly known as:  COZAAR Take 100 mg daily by mouth.   metFORMIN 500 MG 24 hr tablet Commonly known as:  GLUCOPHAGE-XR Take 1,000 mg by mouth daily.   nadolol 40 MG tablet Commonly known as:  CORGARD Take 1 tablet (40 mg total) by mouth daily.   nitrofurantoin (macrocrystal-monohydrate) 100 MG capsule Commonly known as:  MACROBID Take 1 capsule (100 mg total) by mouth every 12 (twelve) hours.   pantoprazole 40 MG tablet Commonly known as:  PROTONIX Take 40 mg by mouth 2 (two) times daily.   rOPINIRole 1 MG tablet Commonly known as:  REQUIP Take 1 mg by mouth at bedtime.   traZODone 50 MG tablet Commonly known as:  DESYREL Take 50 mg by mouth at bedtime as needed for sleep.       Allergies:  Allergies  Allergen Reactions  . Codeine   .  Lipitor [Atorvastatin]   . Lisinopril   . Penicillins   . Tylenol With Codeine #3 [Acetaminophen-Codeine]   . Amoxicillin Itching and Rash    Family History: Family History  Problem Relation Age of Onset  . Lung cancer Father   . Heart Problems Father   . Heart failure Mother   . Breast cancer Neg Hx     Social History:  reports that she has been smoking cigarettes.  She has a 48.00 pack-year smoking history. she has never used smokeless tobacco. She reports that she does not drink  alcohol or use drugs.  ROS: UROLOGY Frequent Urination?: Yes Hard to postpone urination?: No Burning/pain with urination?: Yes Get up at night to urinate?: Yes Leakage of urine?: Yes Urine stream starts and stops?: No Trouble starting stream?: No Do you have to strain to urinate?: No Blood in urine?: Yes Urinary tract infection?: Yes Sexually transmitted disease?: No Injury to kidneys or bladder?: No Painful intercourse?: No Weak stream?: No Vaginal bleeding?: No Last menstrual period?: n  Gastrointestinal Nausea?: No Vomiting?: No Indigestion/heartburn?: Yes Diarrhea?: Yes Constipation?: No  Constitutional Fever: Yes Night sweats?: No Weight loss?: No Fatigue?: Yes  Skin Skin rash/lesions?: No Itching?: Yes  Eyes Blurred vision?: No Double vision?: No  Ears/Nose/Throat Sore throat?: No Sinus problems?: Yes  Hematologic/Lymphatic Swollen glands?: No Easy bruising?: No  Cardiovascular Leg swelling?: Yes Chest pain?: Yes  Respiratory Cough?: Yes Shortness of breath?: Yes  Endocrine Excessive thirst?: Yes  Musculoskeletal Back pain?: Yes Joint pain?: No  Neurological Headaches?: Yes Dizziness?: Yes  Psychologic Depression?: Yes Anxiety?: Yes  Physical Exam: BP 126/86   Pulse 65   Ht '5\' 7"'$  (1.702 m)   Wt 194 lb 6.4 oz (88.2 kg)   BMI 30.45 kg/m   Constitutional: Well nourished. Alert and oriented, No acute distress. HEENT: [place] AT, moist mucus  membranes. Trachea midline, no masses. Cardiovascular: No clubbing, cyanosis, or edema. Respiratory: Normal respiratory effort, no increased work of breathing. GI: Abdomen is soft, non tender, non distended, no abdominal masses. Liver and spleen not palpable.  No hernias appreciated.  Stool sample for occult testing is not indicated.   GU: No CVA tenderness.  No bladder fullness or masses.  Atrophic external genitalia, normal pubic hair distribution, no lesions.  Normal urethral meatus, no lesions, no prolapse, no discharge.   No urethral masses, tenderness and/or tenderness. No bladder fullness, tenderness or masses. Normal vagina mucosa, good estrogen effect, no discharge, no lesions, good pelvic support, no cystocele or rectocele noted.  Cervix, uterus and adnexa are surgically absent.  Anus and perineum are without rashes or lesions.    Skin: No rashes, bruises or suspicious lesions. Lymph: No cervical or inguinal adenopathy. Neurologic: Grossly intact, no focal deficits, moving all 4 extremities. Psychiatric: Normal mood and affect.  Laboratory Data: Lab Results  Component Value Date   WBC 4.7 11/28/2016   HGB 8.5 (L) 11/28/2016   HCT 26.4 (L) 11/28/2016   MCV 70.2 (L) 11/28/2016   PLT 68 (L) 11/28/2016    Lab Results  Component Value Date   CREATININE 0.88 11/28/2016    Lab Results  Component Value Date   HGBA1C 8.2 (H) 07/22/2016    Lab Results  Component Value Date   AST 28 11/28/2016   Lab Results  Component Value Date   ALT 20 11/28/2016    Urinalysis    Component Value Date/Time   COLORURINE YELLOW (A) 11/28/2016 1901   APPEARANCEUR HAZY (A) 11/28/2016 1901   APPEARANCEUR Clear 11/28/2013 1732   LABSPEC 1.020 11/28/2016 1901   LABSPEC 1.002 11/28/2013 1732   PHURINE 5.0 11/28/2016 1901   GLUCOSEU >=500 (A) 11/28/2016 1901   GLUCOSEU Negative 11/28/2013 1732   HGBUR NEGATIVE 11/28/2016 1901   BILIRUBINUR NEGATIVE 11/28/2016 1901   BILIRUBINUR Negative  11/28/2013 1732   KETONESUR NEGATIVE 11/28/2016 1901   PROTEINUR 30 (A) 11/28/2016 1901   NITRITE NEGATIVE 11/28/2016 1901   LEUKOCYTESUR NEGATIVE 11/28/2016 1901   LEUKOCYTESUR Negative 11/28/2013 1732    I have reviewed the labs.   Pertinent Imaging:  CLINICAL DATA:  Urinary tract infection.  Sepsis and bacteremia.  EXAM: RENAL / URINARY TRACT ULTRASOUND COMPLETE  COMPARISON:  07/23/2016  FINDINGS: Right Kidney:  Length: 12.3 cm. Echogenicity within normal limits. No mass or hydronephrosis visualized.  Left Kidney:  Length: 11.0 cm. Echogenicity within normal limits. No mass or hydronephrosis visualized.  Bladder:  Appears normal for degree of bladder distention.  IMPRESSION: Normal renal sonogram.   Electronically Signed   By: Kerby Moors M.D.   On: 10/18/2016 17:01  I have independently reviewed the films.    Assessment & Plan:    1. Recurrent UTI's  - criteria for recurrent UTI has been met with 2 or more infections in 6 months or 3 or greater infections in one year   - Patient is instructed to increase their water intake until the urine is pale yellow or clear (10 to 12 cups daily) - good water intake  - probiotics (yogurt, oral pills or vaginal suppositories), take cranberry pills or drink the juice and Vitamin C 1,000 mg daily to acidify the urine should be added to their daily regimen   - avoid soaking in tubs and wipe front to back after urinating   - advised them to have CATH UA's for urinalysis and culture to prevent skin contamination of the specimen  - reviewed symptoms of UTI and advised not to have urine checked or be treated for UTI if not experiencing symptoms  - discussed antibiotic stewardship with the patient    - UA is negative - will send for culture as patient feels she is symptomatic  - will start Macrobid empirically as culture will not be available for at least 72 hours and she is a diabetic and has a recent history of  sepsis  - follow up pending culture results  2. Vaginal atrophy  - encouraged the patient to continue to use the vaginal estrogen cream three times weekly - explained to her that there are studies which demonstrate a reduction in UTI's with the use of the creams  - samples of Premarin given to her and script sent for Premarin cream to see if it is more affordable for the patient                                  Return for pending urine culture results.  These notes generated with voice recognition software. I apologize for typographical errors.  Zara Council, [facility] [address], [place], [place] [address] [phone]

## 2016-12-14 ENCOUNTER — Other Ambulatory Visit: Payer: Self-pay

## 2016-12-14 ENCOUNTER — Ambulatory Visit (INDEPENDENT_AMBULATORY_CARE_PROVIDER_SITE_OTHER): Payer: Medicare Other | Admitting: Urology

## 2016-12-14 ENCOUNTER — Encounter: Payer: Self-pay | Admitting: Urology

## 2016-12-14 VITALS — BP 126/86 | HR 65 | Ht 67.0 in | Wt 194.4 lb

## 2016-12-14 DIAGNOSIS — N390 Urinary tract infection, site not specified: Secondary | ICD-10-CM

## 2016-12-14 DIAGNOSIS — I248 Other forms of acute ischemic heart disease: Secondary | ICD-10-CM | POA: Diagnosis not present

## 2016-12-14 DIAGNOSIS — N952 Postmenopausal atrophic vaginitis: Secondary | ICD-10-CM | POA: Diagnosis not present

## 2016-12-14 LAB — URINALYSIS, COMPLETE
BILIRUBIN UA: NEGATIVE
KETONES UA: NEGATIVE
NITRITE UA: NEGATIVE
Protein, UA: NEGATIVE
Urobilinogen, Ur: 0.2 mg/dL (ref 0.2–1.0)
pH, UA: 6 (ref 5.0–7.5)

## 2016-12-14 LAB — BLADDER SCAN AMB NON-IMAGING

## 2016-12-14 LAB — MICROSCOPIC EXAMINATION

## 2016-12-14 MED ORDER — NITROFURANTOIN MONOHYD MACRO 100 MG PO CAPS: 100.0000 mg | ORAL_CAPSULE | Freq: Two times a day (BID) | ORAL | 0 refills | Status: DC

## 2016-12-14 MED ORDER — FLUCONAZOLE 150 MG PO TABS: 150.0000 mg | ORAL_TABLET | Freq: Once | ORAL | 0 refills | Status: AC

## 2016-12-14 MED ORDER — ESTROGENS, CONJUGATED 0.625 MG/GM VA CREA: TOPICAL_CREAM | VAGINAL | 12 refills | Status: DC

## 2016-12-14 NOTE — Patient Instructions (Signed)

## 2016-12-16 LAB — URINE CULTURE

## 2016-12-18 ENCOUNTER — Telehealth: Payer: Self-pay | Admitting: Urology

## 2016-12-18 ENCOUNTER — Telehealth: Payer: Self-pay

## 2016-12-18 NOTE — Telephone Encounter (Signed)
LMOM- +ucx and on appropriate abx. Also continue estrogen cream 3 nights a week. If cost prohibitive call back and compounded cream will be called in.

## 2016-12-18 NOTE — Telephone Encounter (Signed)
Spoke with pt in reference to needing a 76mo f/u and price of compounded estrogen cream. Pt stated that she is not able to afford the $70 and would just have to wait for samples. 62mo f/u made.

## 2016-12-18 NOTE — Telephone Encounter (Signed)
-----   Message from Nori Riis, PA-C sent at 12/17/2016  4:49 PM EST ----- Please let [name] know that her urine culture was positive.  The Macrobid is the appropriate antibiotic.  She needs to finish the antibiotic and continue the use the vaginal estrogen cream three nights weekly.  If the prescription estrogen is cost prohibitive, we can call in the compounded estrogen cream to the [facility].

## 2016-12-18 NOTE — Telephone Encounter (Signed)
Pt called office returning Chelsea's call, pt has questions as to if she needs to come back to the office to get rechecked? Also states she needs the compounding estrogen Cream that was offered to her, the other cream is $70 . Pt asks to have her call returned. Please advise. Thanks.

## 2017-01-22 ENCOUNTER — Ambulatory Visit: Payer: Self-pay | Admitting: Urology

## 2017-02-05 ENCOUNTER — Inpatient Hospital Stay
Admission: EM | Admit: 2017-02-05 | Discharge: 2017-02-08 | DRG: 690 | Disposition: A | Discharge status: Home / Self Care | Payer: Medicare Other | Attending: Internal Medicine | Admitting: Internal Medicine

## 2017-02-05 ENCOUNTER — Encounter: Payer: Self-pay | Admitting: Emergency Medicine

## 2017-02-05 ENCOUNTER — Other Ambulatory Visit: Payer: Self-pay

## 2017-02-05 DIAGNOSIS — D61818 Other pancytopenia: Secondary | ICD-10-CM | POA: Diagnosis present

## 2017-02-05 DIAGNOSIS — Z90710 Acquired absence of both cervix and uterus: Secondary | ICD-10-CM

## 2017-02-05 DIAGNOSIS — J449 Chronic obstructive pulmonary disease, unspecified: Secondary | ICD-10-CM | POA: Diagnosis present

## 2017-02-05 DIAGNOSIS — I340 Nonrheumatic mitral (valve) insufficiency: Secondary | ICD-10-CM | POA: Diagnosis present

## 2017-02-05 DIAGNOSIS — B9789 Other viral agents as the cause of diseases classified elsewhere: Secondary | ICD-10-CM | POA: Diagnosis present

## 2017-02-05 DIAGNOSIS — K219 Gastro-esophageal reflux disease without esophagitis: Secondary | ICD-10-CM | POA: Diagnosis present

## 2017-02-05 DIAGNOSIS — R509 Fever, unspecified: Secondary | ICD-10-CM | POA: Diagnosis present

## 2017-02-05 DIAGNOSIS — K7460 Unspecified cirrhosis of liver: Secondary | ICD-10-CM | POA: Diagnosis present

## 2017-02-05 DIAGNOSIS — N390 Urinary tract infection, site not specified: Secondary | ICD-10-CM | POA: Diagnosis not present

## 2017-02-05 DIAGNOSIS — N12 Tubulo-interstitial nephritis, not specified as acute or chronic: Secondary | ICD-10-CM | POA: Diagnosis not present

## 2017-02-05 DIAGNOSIS — F17210 Nicotine dependence, cigarettes, uncomplicated: Secondary | ICD-10-CM | POA: Diagnosis present

## 2017-02-05 DIAGNOSIS — F419 Anxiety disorder, unspecified: Secondary | ICD-10-CM | POA: Diagnosis present

## 2017-02-05 DIAGNOSIS — F329 Major depressive disorder, single episode, unspecified: Secondary | ICD-10-CM | POA: Diagnosis present

## 2017-02-05 DIAGNOSIS — E785 Hyperlipidemia, unspecified: Secondary | ICD-10-CM | POA: Diagnosis present

## 2017-02-05 DIAGNOSIS — E871 Hypo-osmolality and hyponatremia: Secondary | ICD-10-CM | POA: Diagnosis present

## 2017-02-05 DIAGNOSIS — I10 Essential (primary) hypertension: Secondary | ICD-10-CM | POA: Diagnosis present

## 2017-02-05 DIAGNOSIS — G4730 Sleep apnea, unspecified: Secondary | ICD-10-CM | POA: Diagnosis present

## 2017-02-05 DIAGNOSIS — Z794 Long term (current) use of insulin: Secondary | ICD-10-CM

## 2017-02-05 DIAGNOSIS — E860 Dehydration: Secondary | ICD-10-CM | POA: Diagnosis present

## 2017-02-05 DIAGNOSIS — Z87440 Personal history of urinary (tract) infections: Secondary | ICD-10-CM

## 2017-02-05 DIAGNOSIS — I7300 Raynaud's syndrome without gangrene: Secondary | ICD-10-CM | POA: Diagnosis present

## 2017-02-05 DIAGNOSIS — E11649 Type 2 diabetes mellitus with hypoglycemia without coma: Secondary | ICD-10-CM | POA: Diagnosis not present

## 2017-02-05 DIAGNOSIS — Z79899 Other long term (current) drug therapy: Secondary | ICD-10-CM

## 2017-02-05 LAB — CBC
HCT: 28 % — ABNORMAL LOW (ref 35.0–47.0)
HEMOGLOBIN: 8.8 g/dL — AB (ref 12.0–16.0)
MCH: 21.4 pg — AB (ref 26.0–34.0)
MCHC: 31.5 g/dL — ABNORMAL LOW (ref 32.0–36.0)
MCV: 67.8 fL — AB (ref 80.0–100.0)
PLATELETS: 72 10*3/uL — AB (ref 150–440)
RBC: 4.14 MIL/uL (ref 3.80–5.20)
RDW: 18.6 % — ABNORMAL HIGH (ref 11.5–14.5)
WBC: 6.5 10*3/uL (ref 3.6–11.0)

## 2017-02-05 LAB — COMPREHENSIVE METABOLIC PANEL
ALBUMIN: 4 g/dL (ref 3.5–5.0)
ALT: 17 U/L (ref 14–54)
AST: 30 U/L (ref 15–41)
Alkaline Phosphatase: 88 U/L (ref 38–126)
Anion gap: 8 (ref 5–15)
BUN: 16 mg/dL (ref 6–20)
CHLORIDE: 99 mmol/L — AB (ref 101–111)
CO2: 22 mmol/L (ref 22–32)
CREATININE: 0.93 mg/dL (ref 0.44–1.00)
Calcium: 8.9 mg/dL (ref 8.9–10.3)
GFR calc non Af Amer: >60 mL/min (ref >60)
GLUCOSE: 173 mg/dL — AB (ref 65–99)
Potassium: 3.9 mmol/L (ref 3.5–5.1)
SODIUM: 129 mmol/L — AB (ref 135–145)
Total Bilirubin: 0.8 mg/dL (ref 0.3–1.2)
Total Protein: 7.6 g/dL (ref 6.5–8.1)

## 2017-02-05 LAB — URINALYSIS, COMPLETE (UACMP) WITH MICROSCOPIC
BILIRUBIN URINE: NEGATIVE
Glucose, UA: NEGATIVE mg/dL
KETONES UR: NEGATIVE mg/dL
Nitrite: NEGATIVE
Protein, ur: NEGATIVE mg/dL
SQUAMOUS EPITHELIAL / LPF: NONE SEEN
Specific Gravity, Urine: 1.014 (ref 1.005–1.030)
pH: 5 (ref 5.0–8.0)

## 2017-02-05 LAB — LACTIC ACID, PLASMA: LACTIC ACID, VENOUS: 1.7 mmol/L (ref 0.5–1.9)

## 2017-02-05 MED ORDER — KETOROLAC TROMETHAMINE 30 MG/ML IJ SOLN
30.0000 mg | Freq: Once | INTRAMUSCULAR | Status: AC
  Administered 2017-02-06: 30 mg via INTRAVENOUS
  Filled 2017-02-05: qty 1

## 2017-02-05 MED ORDER — SODIUM CHLORIDE 0.9 % IV BOLUS (SEPSIS)
1000.0000 mL | Freq: Once | INTRAVENOUS | Status: AC
  Administered 2017-02-05: 1000 mL via INTRAVENOUS

## 2017-02-05 MED ORDER — DIPHENHYDRAMINE HCL 50 MG/ML IJ SOLN
25.0000 mg | Freq: Once | INTRAMUSCULAR | Status: AC
  Administered 2017-02-06: 25 mg via INTRAVENOUS
  Filled 2017-02-05: qty 1

## 2017-02-05 MED ORDER — METOCLOPRAMIDE HCL 5 MG/ML IJ SOLN
10.0000 mg | Freq: Once | INTRAMUSCULAR | Status: AC
  Administered 2017-02-06: 10 mg via INTRAVENOUS
  Filled 2017-02-05: qty 2

## 2017-02-05 MED ORDER — DEXTROSE 5 % IV SOLN
1.0000 g | Freq: Once | INTRAVENOUS | Status: AC
  Administered 2017-02-06: 1 g via INTRAVENOUS
  Filled 2017-02-05: qty 1

## 2017-02-05 NOTE — ED Triage Notes (Addendum)
Pt presents to ED via EMS from home with c/o lower back pain and fever since Sunday. Pt does report burning when she urinates and frequency. Hx of urinary sepsis in October.

## 2017-02-05 NOTE — ED Notes (Signed)
Pt c/o back pain, urinary frequency and urgency

## 2017-02-06 ENCOUNTER — Encounter: Payer: Self-pay | Admitting: Internal Medicine

## 2017-02-06 ENCOUNTER — Other Ambulatory Visit: Payer: Self-pay

## 2017-02-06 DIAGNOSIS — K7460 Unspecified cirrhosis of liver: Secondary | ICD-10-CM | POA: Diagnosis present

## 2017-02-06 DIAGNOSIS — F419 Anxiety disorder, unspecified: Secondary | ICD-10-CM | POA: Diagnosis present

## 2017-02-06 DIAGNOSIS — R509 Fever, unspecified: Secondary | ICD-10-CM | POA: Diagnosis present

## 2017-02-06 DIAGNOSIS — E11649 Type 2 diabetes mellitus with hypoglycemia without coma: Secondary | ICD-10-CM | POA: Diagnosis not present

## 2017-02-06 DIAGNOSIS — E860 Dehydration: Secondary | ICD-10-CM | POA: Diagnosis present

## 2017-02-06 DIAGNOSIS — B9789 Other viral agents as the cause of diseases classified elsewhere: Secondary | ICD-10-CM | POA: Diagnosis present

## 2017-02-06 DIAGNOSIS — Z90710 Acquired absence of both cervix and uterus: Secondary | ICD-10-CM | POA: Diagnosis not present

## 2017-02-06 DIAGNOSIS — K219 Gastro-esophageal reflux disease without esophagitis: Secondary | ICD-10-CM | POA: Diagnosis present

## 2017-02-06 DIAGNOSIS — N12 Tubulo-interstitial nephritis, not specified as acute or chronic: Secondary | ICD-10-CM | POA: Diagnosis present

## 2017-02-06 DIAGNOSIS — I10 Essential (primary) hypertension: Secondary | ICD-10-CM | POA: Diagnosis present

## 2017-02-06 DIAGNOSIS — F17210 Nicotine dependence, cigarettes, uncomplicated: Secondary | ICD-10-CM | POA: Diagnosis present

## 2017-02-06 DIAGNOSIS — E785 Hyperlipidemia, unspecified: Secondary | ICD-10-CM | POA: Diagnosis present

## 2017-02-06 DIAGNOSIS — Z79899 Other long term (current) drug therapy: Secondary | ICD-10-CM | POA: Diagnosis not present

## 2017-02-06 DIAGNOSIS — E871 Hypo-osmolality and hyponatremia: Secondary | ICD-10-CM | POA: Diagnosis present

## 2017-02-06 DIAGNOSIS — G4730 Sleep apnea, unspecified: Secondary | ICD-10-CM | POA: Diagnosis present

## 2017-02-06 DIAGNOSIS — J449 Chronic obstructive pulmonary disease, unspecified: Secondary | ICD-10-CM | POA: Diagnosis present

## 2017-02-06 DIAGNOSIS — D61818 Other pancytopenia: Secondary | ICD-10-CM | POA: Diagnosis present

## 2017-02-06 DIAGNOSIS — F329 Major depressive disorder, single episode, unspecified: Secondary | ICD-10-CM | POA: Diagnosis present

## 2017-02-06 DIAGNOSIS — I7300 Raynaud's syndrome without gangrene: Secondary | ICD-10-CM | POA: Diagnosis present

## 2017-02-06 DIAGNOSIS — N390 Urinary tract infection, site not specified: Secondary | ICD-10-CM | POA: Diagnosis present

## 2017-02-06 DIAGNOSIS — Z794 Long term (current) use of insulin: Secondary | ICD-10-CM | POA: Diagnosis not present

## 2017-02-06 DIAGNOSIS — I340 Nonrheumatic mitral (valve) insufficiency: Secondary | ICD-10-CM | POA: Diagnosis present

## 2017-02-06 DIAGNOSIS — Z87440 Personal history of urinary (tract) infections: Secondary | ICD-10-CM | POA: Diagnosis not present

## 2017-02-06 LAB — CBC
HEMATOCRIT: 25.8 % — AB (ref 35.0–47.0)
HEMOGLOBIN: 7.8 g/dL — AB (ref 12.0–16.0)
MCH: 21.1 pg — ABNORMAL LOW (ref 26.0–34.0)
MCHC: 30 g/dL — AB (ref 32.0–36.0)
MCV: 70.2 fL — ABNORMAL LOW (ref 80.0–100.0)
Platelets: 59 10*3/uL — ABNORMAL LOW (ref 150–440)
RBC: 3.68 MIL/uL — AB (ref 3.80–5.20)
RDW: 19 % — ABNORMAL HIGH (ref 11.5–14.5)
WBC: 4.8 10*3/uL (ref 3.6–11.0)

## 2017-02-06 LAB — BASIC METABOLIC PANEL
ANION GAP: 9 (ref 5–15)
BUN: 18 mg/dL (ref 6–20)
CO2: 18 mmol/L — ABNORMAL LOW (ref 22–32)
Calcium: 8.1 mg/dL — ABNORMAL LOW (ref 8.9–10.3)
Chloride: 100 mmol/L — ABNORMAL LOW (ref 101–111)
Creatinine, Ser: 0.92 mg/dL (ref 0.44–1.00)
GFR calc Af Amer: >60 mL/min (ref >60)
GFR calc non Af Amer: >60 mL/min (ref >60)
GLUCOSE: 329 mg/dL — AB (ref 65–99)
POTASSIUM: 3.6 mmol/L (ref 3.5–5.1)
Sodium: 127 mmol/L — ABNORMAL LOW (ref 135–145)

## 2017-02-06 LAB — GLUCOSE, CAPILLARY
Glucose-Capillary: 399 mg/dL — ABNORMAL HIGH (ref 65–99)
Glucose-Capillary: 165 mg/dL — ABNORMAL HIGH (ref 65–99)
Glucose-Capillary: 131 mg/dL — ABNORMAL HIGH (ref 65–99)

## 2017-02-06 LAB — LACTIC ACID, PLASMA: LACTIC ACID, VENOUS: 1.2 mmol/L (ref 0.5–1.9)

## 2017-02-06 MED ORDER — INSULIN ASPART 100 UNIT/ML ~~LOC~~ SOLN
0.0000 [IU] | Freq: Three times a day (TID) | SUBCUTANEOUS | Status: DC
  Administered 2017-02-06: 9 [IU] via SUBCUTANEOUS
  Filled 2017-02-06: qty 1

## 2017-02-06 MED ORDER — INSULIN DETEMIR 100 UNIT/ML ~~LOC~~ SOLN
20.0000 [IU] | Freq: Two times a day (BID) | SUBCUTANEOUS | Status: DC
  Administered 2017-02-06: 20 [IU] via SUBCUTANEOUS
  Filled 2017-02-06 (×3): qty 0.2

## 2017-02-06 MED ORDER — SODIUM CHLORIDE 0.9 % IV SOLN
INTRAVENOUS | Status: DC
  Administered 2017-02-06 – 2017-02-07 (×3): via INTRAVENOUS

## 2017-02-06 MED ORDER — ROPINIROLE HCL 1 MG PO TABS
1.0000 mg | ORAL_TABLET | Freq: Every day | ORAL | Status: DC
  Administered 2017-02-06 – 2017-02-07 (×2): 1 mg via ORAL
  Filled 2017-02-06 (×2): qty 1

## 2017-02-06 MED ORDER — INSULIN ASPART 100 UNIT/ML ~~LOC~~ SOLN: 0.0000 [IU] | Freq: Every day | SUBCUTANEOUS | Status: DC

## 2017-02-06 MED ORDER — SODIUM CHLORIDE 0.9% FLUSH: 3.0000 mL | INTRAVENOUS | Status: DC | PRN

## 2017-02-06 MED ORDER — TOBRAMYCIN SULFATE 80 MG/2ML IJ SOLN
500.0000 mg | INTRAVENOUS | Status: DC
  Administered 2017-02-06: 500 mg via INTRAVENOUS
  Filled 2017-02-06: qty 12.5

## 2017-02-06 MED ORDER — ACETAMINOPHEN 325 MG PO TABS
650.0000 mg | ORAL_TABLET | Freq: Four times a day (QID) | ORAL | Status: DC | PRN
  Administered 2017-02-06 – 2017-02-07 (×3): 650 mg via ORAL
  Filled 2017-02-06 (×3): qty 2

## 2017-02-06 MED ORDER — LOSARTAN POTASSIUM 50 MG PO TABS
100.0000 mg | ORAL_TABLET | Freq: Every day | ORAL | Status: DC
  Administered 2017-02-06 – 2017-02-08 (×3): 100 mg via ORAL
  Filled 2017-02-06 (×3): qty 2

## 2017-02-06 MED ORDER — ONDANSETRON HCL 4 MG PO TABS: 4.0000 mg | ORAL_TABLET | Freq: Four times a day (QID) | ORAL | Status: DC | PRN

## 2017-02-06 MED ORDER — GABAPENTIN 100 MG PO CAPS
100.0000 mg | ORAL_CAPSULE | Freq: Two times a day (BID) | ORAL | Status: DC
  Administered 2017-02-06 – 2017-02-08 (×6): 100 mg via ORAL
  Filled 2017-02-06 (×6): qty 1

## 2017-02-06 MED ORDER — HYDROCODONE-ACETAMINOPHEN 5-325 MG PO TABS
1.0000 | ORAL_TABLET | Freq: Four times a day (QID) | ORAL | Status: DC | PRN
  Administered 2017-02-06 – 2017-02-08 (×8): 1 via ORAL
  Filled 2017-02-06 (×8): qty 1

## 2017-02-06 MED ORDER — SENNOSIDES-DOCUSATE SODIUM 8.6-50 MG PO TABS: 1.0000 | ORAL_TABLET | Freq: Every evening | ORAL | Status: DC | PRN

## 2017-02-06 MED ORDER — ACETAMINOPHEN 650 MG RE SUPP: 650.0000 mg | Freq: Four times a day (QID) | RECTAL | Status: DC | PRN

## 2017-02-06 MED ORDER — ALPRAZOLAM 0.25 MG PO TABS
0.2500 mg | ORAL_TABLET | Freq: Every evening | ORAL | Status: DC | PRN
  Administered 2017-02-07: 0.25 mg via ORAL
  Filled 2017-02-06: qty 1

## 2017-02-06 MED ORDER — PANTOPRAZOLE SODIUM 40 MG PO TBEC
40.0000 mg | DELAYED_RELEASE_TABLET | Freq: Two times a day (BID) | ORAL | Status: DC
  Administered 2017-02-06 – 2017-02-08 (×5): 40 mg via ORAL
  Filled 2017-02-06 (×5): qty 1

## 2017-02-06 MED ORDER — METFORMIN HCL ER 500 MG PO TB24
1000.0000 mg | ORAL_TABLET | Freq: Every day | ORAL | Status: DC
  Administered 2017-02-06: 12:00:00 1000 mg via ORAL
  Filled 2017-02-06: qty 2

## 2017-02-06 MED ORDER — TRAZODONE HCL 50 MG PO TABS
50.0000 mg | ORAL_TABLET | Freq: Every evening | ORAL | Status: DC | PRN
  Administered 2017-02-07: 50 mg via ORAL
  Filled 2017-02-06: qty 1

## 2017-02-06 MED ORDER — NADOLOL 20 MG PO TABS
40.0000 mg | ORAL_TABLET | Freq: Every day | ORAL | Status: DC
  Administered 2017-02-06 – 2017-02-08 (×3): 40 mg via ORAL
  Filled 2017-02-06 (×3): qty 2

## 2017-02-06 MED ORDER — ONDANSETRON HCL 4 MG/2ML IJ SOLN
4.0000 mg | Freq: Four times a day (QID) | INTRAMUSCULAR | Status: DC | PRN
  Administered 2017-02-06 – 2017-02-07 (×4): 4 mg via INTRAVENOUS
  Filled 2017-02-06 (×4): qty 2

## 2017-02-06 MED ORDER — ENSURE ENLIVE PO LIQD
237.0000 mL | Freq: Two times a day (BID) | ORAL | Status: DC
  Administered 2017-02-06 (×2): 237 mL via ORAL

## 2017-02-06 MED ORDER — INSULIN ASPART 100 UNIT/ML ~~LOC~~ SOLN
0.0000 [IU] | Freq: Three times a day (TID) | SUBCUTANEOUS | Status: DC
  Administered 2017-02-06: 3 [IU] via SUBCUTANEOUS
  Administered 2017-02-07 – 2017-02-08 (×2): 2 [IU] via SUBCUTANEOUS
  Filled 2017-02-06 (×3): qty 1

## 2017-02-06 MED ORDER — INSULIN ASPART 100 UNIT/ML ~~LOC~~ SOLN
6.0000 [IU] | Freq: Three times a day (TID) | SUBCUTANEOUS | Status: DC
  Administered 2017-02-06 – 2017-02-08 (×2): 6 [IU] via SUBCUTANEOUS
  Filled 2017-02-06 (×2): qty 1

## 2017-02-06 MED ORDER — SODIUM CHLORIDE 0.9 % IV SOLN: 250.0000 mL | INTRAVENOUS | Status: DC | PRN

## 2017-02-06 MED ORDER — HEPARIN SODIUM (PORCINE) 5000 UNIT/ML IJ SOLN
5000.0000 [IU] | Freq: Three times a day (TID) | INTRAMUSCULAR | Status: DC
  Administered 2017-02-06 (×2): 5000 [IU] via SUBCUTANEOUS
  Filled 2017-02-06 (×2): qty 1

## 2017-02-06 MED ORDER — SODIUM CHLORIDE 0.9 % IV SOLN
1.0000 g | Freq: Three times a day (TID) | INTRAVENOUS | Status: DC
  Administered 2017-02-06 – 2017-02-07 (×4): 1 g via INTRAVENOUS
  Filled 2017-02-06 (×5): qty 1

## 2017-02-06 MED ORDER — GLIMEPIRIDE 2 MG PO TABS
4.0000 mg | ORAL_TABLET | Freq: Every day | ORAL | Status: DC
  Administered 2017-02-06: 12:00:00 4 mg via ORAL
  Filled 2017-02-06 (×2): qty 2

## 2017-02-06 MED ORDER — SODIUM CHLORIDE 0.9% FLUSH
3.0000 mL | Freq: Two times a day (BID) | INTRAVENOUS | Status: DC
  Administered 2017-02-06 – 2017-02-08 (×3): 3 mL via INTRAVENOUS

## 2017-02-06 MED ORDER — CYCLOBENZAPRINE HCL 10 MG PO TABS
10.0000 mg | ORAL_TABLET | Freq: Three times a day (TID) | ORAL | Status: DC | PRN
  Administered 2017-02-08: 10 mg via ORAL
  Filled 2017-02-06: qty 1

## 2017-02-06 NOTE — ED Provider Notes (Signed)
[facility] Emergency Department Provider Note   ____________________________________________   First MD Initiated Contact with Patient 02/05/17 2305     (approximate)  I have reviewed the triage vital signs and the nursing notes.   HISTORY  Chief Complaint Back Pain and Fever    HPI Vanessa Oneill is a 67 y.o. female who comes into the hospital today with some back pain and headache.  She reports that she was in the bathroom and felt very weak.  She became limp as a dish rag.  The patient states that her husband came into the room and she was on the floor.  The patient could not get up.  She denies passing out.  She reports that she is been feeling weak since last night.  The back pain started yesterday in her kidneys and in her low back.  The patient is also been having some burning with urination.  The patient was admitted to the hospital with urosepsis and bacteremia in October.  The patient states that she has had a temperature at home with a T-max of 101.  The patient has not taken any medication for back pain.  She states that her headache is a 8 out of 10 in her back is a 9 out of 10 in intensity.  The patient states that she does get headaches frequently.  She has had some nausea with no vomiting.  She is here for evaluation.   Past Medical History:  Diagnosis Date  . Acid reflux   . Anxiety   . Arthritis   . Cerebral aneurysm   . COPD (chronic obstructive pulmonary disease) ([place])   . Depression   . Diabetes mellitus without complication ([place])   . Fibrocystic breast disease   . Hyperlipidemia   . Hypertension   . Liver disease   . Mitral valve regurgitation   . 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[place])   . Personal history of tobacco use, presenting hazards to health 11/12/2014  . Raynaud's disease   . Sleep apnea     Patient Active Problem List   Diagnosis Date Noted  . UTI (urinary tract infection) 02/06/2017  . Septic shock ([place]) 10/18/2016   . Urinary tract infection without hematuria   . Respiratory distress   . Sepsis ([place]) 10/17/2016  . GIB (gastrointestinal bleeding) 07/22/2016  . Personal history of tobacco use, presenting hazards to health 11/12/2014    Past Surgical History:  Procedure Laterality Date  . ABDOMINAL HYSTERECTOMY    . APPENDECTOMY    . BREAST BIOPSY Right    neg- core  . CHOLECYSTECTOMY    . ESOPHAGOGASTRODUODENOSCOPY (EGD) WITH PROPOFOL N/A 07/24/2016   Procedure: ESOPHAGOGASTRODUODENOSCOPY (EGD) WITH PROPOFOL;  Surgeon: Jonathon Bellows, MD;  Location[facility]COPY;  Service: Endoscopy;  Laterality: N/A;  . ESOPHAGOGASTRODUODENOSCOPY (EGD) WITH PROPOFOL N/A 11/21/2016   Procedure: ESOPHAGOGASTRODUODENOSCOPY (EGD) WITH PROPOFOL;  Surgeon: [place], Benay Pike, MD;  Location: [facility] ENDOSCOPY;  Service: Gastroenterology;  Laterality: N/A;  . TONSILLECTOMY      Prior to Admission medications   Medication Sig Start Date End Date Taking? Authorizing Provider  ALPRAZolam Duanne Moron) 0.25 MG tablet Take 0.25 mg by mouth at bedtime as needed for sleep. 07/06/16  Yes [provider]  cyclobenzaprine (FLEXERIL) 10 MG tablet Take 10 mg by mouth 3 (three) times daily as needed for muscle spasms. 09/20/15  Yes [provider]  escitalopram (LEXAPRO) 10 MG tablet Take 10 mg by mouth daily.   Yes [provider]  gabapentin (NEURONTIN) 100 MG capsule Take 100 mg by mouth 2 (two) times daily.   Yes [provider]  glimepiride (AMARYL) 4 MG tablet Take 4 mg daily with breakfast by mouth.   Yes [provider]  hydrochlorothiazide (HYDRODIURIL) 25 MG tablet Take 25 mg daily by mouth.   Yes [provider]  HYDROcodone-acetaminophen (NORCO/VICODIN) 5-325 MG tablet Take 1 tablet by mouth every 6 (six) hours as needed for moderate pain.   Yes [provider]  insulin NPH Human (HUMULIN N,NOVOLIN N) 100 UNIT/ML injection Inject 30 Units into the skin 2 (two) times daily  before a meal. Use 30 units in the morning, 20 units during the day and 30 units at night.   Yes [provider]  losartan (COZAAR) 100 MG tablet Take 100 mg daily by mouth.   Yes [provider]  metFORMIN (GLUCOPHAGE-XR) 500 MG 24 hr tablet Take 1,000 mg by mouth daily. 03/07/16  Yes [provider]  nadolol (CORGARD) 40 MG tablet Take 1 tablet (40 mg total) by mouth daily. 10/24/16  Yes Gladstone Lighter, MD  pantoprazole (PROTONIX) 40 MG tablet Take 40 mg by mouth 2 (two) times daily.    Yes [provider]  rOPINIRole (REQUIP) 1 MG tablet Take 1 mg by mouth at bedtime.    Yes [provider]  traZODone (DESYREL) 50 MG tablet Take 50 mg by mouth at bedtime as needed for sleep. 06/19/16  Yes [provider]  buPROPion (WELLBUTRIN XL) 150 MG 24 hr tablet Take 150 mg by mouth daily.    [provider]  conjugated estrogens (PREMARIN) vaginal cream Apply 0.5mg  (pea-sized amount)  just inside the vaginal introitus with a finger-tip on  Monday, Wednesday and Friday nights. Patient not taking: Reported on 02/06/2017 12/14/16   Zara Council A, PA-C  DULoxetine (CYMBALTA) 30 MG capsule Take 30 mg by mouth daily.    [provider]  ertapenem 1 g in sodium chloride 0.9 % 50 mL Inject 1 g into the vein daily. X 10 days Patient not taking: Reported on 11/20/2016 10/24/16   Gladstone Lighter, MD  estradiol (ESTRACE) 0.1 MG/GM vaginal cream Place 1 Applicatorful vaginally 3 (three) times a week.    [provider]  feeding supplement, ENSURE ENLIVE, (ENSURE ENLIVE) LIQD Take 237 mLs by mouth 2 (two) times daily between meals. 10/24/16   Gladstone Lighter, MD  insulin regular (HUMULIN R) 100 units/mL injection Inject 30 Units into the skin 3 (three) times daily. 03/09/16   [provider]  ketorolac (TORADOL) 10 MG tablet Take 10 mg by mouth daily as needed (for headache).    [provider]  nitrofurantoin,  macrocrystal-monohydrate, (MACROBID) 100 MG capsule Take 1 capsule (100 mg total) by mouth every 12 (twelve) hours. 12/14/16   Zara Council A, PA-C    Allergies Codeine; Lipitor [atorvastatin]; Lisinopril; Penicillins; Tylenol with codeine #3 [acetaminophen-codeine]; and Amoxicillin  Family History  Problem Relation Age of Onset  . Lung cancer Father   . Heart Problems Father   . Heart failure Mother   . Breast cancer Neg Hx     Social History Social History   Tobacco Use  . Smoking status: Current Every Day Smoker    Packs/day: 1.00    Years: 48.00    Pack years: 48.00    Types: Cigarettes  . Smokeless tobacco: Never Used  . Tobacco comment: patient refused  Substance Use Topics  . Alcohol use: No    Alcohol/week:  0.0 oz  . Drug use: No    Review of Systems  Constitutional:  fever/chills Eyes: No visual changes. ENT: No sore throat. Cardiovascular: Denies chest pain. Respiratory: Denies shortness of breath. Gastrointestinal: Nausea with no abdominal pain. no vomiting. No diarrhea.  No constipation. Genitourinary: Negative for dysuria. Musculoskeletal:  back pain. Skin: Negative for rash. Neurological: Weakness   ____________________________________________   PHYSICAL EXAM:  VITAL SIGNS: ED Triage Vitals  Enc Vitals Group     BP 02/05/17 2006 126/67     Pulse Rate 02/05/17 2006 80     Resp 02/05/17 2006 (!) 22     Temp 02/05/17 2006 99.2 F (37.3 C)     Temp Source 02/05/17 2006 Oral     SpO2 02/05/17 2006 96 %     Weight 02/05/17 2007 194 lb (88 kg)     Height 02/05/17 2007 5\' 7"  (1.702 m)     Head Circumference --      Peak Flow --      Pain Score 02/05/17 2006 8     Pain Loc --      Pain Edu? --      Excl. in [place]? --     Constitutional: Alert and oriented. Well appearing and in moderate distress. Eyes: Conjunctivae are normal. PERRL. EOMI. Head: Atraumatic. Nose: No congestion/rhinnorhea. Mouth/Throat: Mucous membranes are moist.   Oropharynx non-erythematous. Cardiovascular: Normal rate, regular rhythm. Grossly normal heart sounds.  Good peripheral circulation. Respiratory: Normal respiratory effort.  No retractions. Lungs CTAB. Gastrointestinal: Soft and nontender. No distention.  Positive bowel sounds, tenderness to palpation Musculoskeletal: No lower extremity tenderness nor edema.   Neurologic:  Normal speech and language.  Cranial nerves II through XII grossly intact with no focal motor neuro deficit Skin:  Skin is warm, dry and intact.  Psychiatric: Mood and affect are normal.   ____________________________________________   LABS (all labs ordered are listed, but only abnormal results are displayed)  Labs Reviewed  COMPREHENSIVE METABOLIC PANEL - Abnormal; Notable for the following components:      Result Value   Sodium 129 (*)    Chloride 99 (*)    Glucose, Bld 173 (*)    All other components within normal limits  CBC - Abnormal; Notable for the following components:   Hemoglobin 8.8 (*)    HCT 28.0 (*)    MCV 67.8 (*)    MCH 21.4 (*)    MCHC 31.5 (*)    RDW 18.6 (*)    Platelets 72 (*)    All other components within normal limits  URINALYSIS, COMPLETE (UACMP) WITH MICROSCOPIC - Abnormal; Notable for the following components:   Color, Urine YELLOW (*)    APPearance HAZY (*)    Hgb urine dipstick SMALL (*)    Leukocytes, UA SMALL (*)    Bacteria, UA RARE (*)    All other components within normal limits  URINE CULTURE  CULTURE, BLOOD (ROUTINE X 2)  CULTURE, BLOOD (ROUTINE X 2)  LACTIC ACID, PLASMA  LACTIC ACID, PLASMA  TOBRAMYCIN LEVEL, RANDOM  BASIC METABOLIC PANEL  CBC   ____________________________________________  EKG  none ____________________________________________  RADIOLOGY  ED MD interpretation:  none  Official radiology report(s): No results found.  ____________________________________________   PROCEDURES  Procedure(s) performed: None  Procedures  Critical  Care performed: No  ____________________________________________   INITIAL IMPRESSION / ASSESSMENT AND PLAN / ED COURSE  As part of my medical decision making, I reviewed the following data within the electronic medical  record:  Notes from prior ED visits and [place] Controlled Substance Database   This is a 67 year old female who comes into the hospital today with some back pain headache and burning with urination.  The patient has a history of frequent UTIs.  Differential diagnosis includes musculaoskeletal pain and urinary tract infection.  We did check some blood work on the patient.  She had a CBC, CMP and a lactic acid.  The patient CBC was unremarkable and the patient's CMP showed a sodium of 129.  The patient's urinalysis though did show too numerous to count white blood cells which is concerning for urinary tract infection.  Looking back at the patient's previous urine cultures she has grown out E. coli which was resistant to many medications.  The patient is penicillin allergic but her E. coli is resistant to Bactrim, ceftriaxone, levofloxacin, ciprofloxacin.  I did give the patient a dose of aztreonam in the emergency department.  Since she has had sepsis in the past and has resistant E. coli in her urine previously I will admit the patient to the hospitalist service.  The patient did receive a liter of normal saline, Reglan, Benadryl and Toradol for her headache.  She will be admitted for further evaluation.      ____________________________________________   FINAL CLINICAL IMPRESSION(S) / ED DIAGNOSES  Final diagnoses:  Pyelonephritis     ED Discharge Orders    None       Note:  This document was prepared using Dragon voice recognition software and may include unintentional dictation errors.    Loney Hering, MD 02/06/17 [phone]

## 2017-02-06 NOTE — Progress Notes (Signed)
[place] [facility] [place]    MR#:  [id number]  DATE OF BIRTH:  1950-01-19  SUBJECTIVE:  CHIEF COMPLAINT: Patient is reporting back pain REVIEW OF SYSTEMS:  CONSTITUTIONAL: No fever, fatigue or weakness.  EYES: No blurred or double vision.  EARS, NOSE, AND THROAT: No tinnitus or ear pain.  RESPIRATORY: No cough, shortness of breath, wheezing or hemoptysis.  CARDIOVASCULAR: No chest pain, orthopnea, edema.  GASTROINTESTINAL: No nausea, vomiting, diarrhea or abdominal pain.  GENITOURINARY: No dysuria, hematuria.  ENDOCRINE: No polyuria, nocturia,  HEMATOLOGY: No anemia, easy bruising or bleeding SKIN: No rash or lesion. MUSCULOSKELETAL: No joint pain or arthritis.   NEUROLOGIC: No tingling, numbness, weakness.  PSYCHIATRY: No anxiety or depression.   DRUG ALLERGIES:   Allergies  Allergen Reactions  . Codeine   . Lipitor [Atorvastatin]   . Lisinopril   . Penicillins   . Tylenol With Codeine #3 [Acetaminophen-Codeine]   . Amoxicillin Itching and Rash    VITALS:  Blood pressure (!) 116/53, pulse 65, temperature 98 F (36.7 C), temperature source Oral, resp. rate 16, height 5\' 7"  (1.702 m), weight 92.3 kg (203 lb 6.4 oz), SpO2 100 %.  PHYSICAL EXAMINATION:  GENERAL:  67 y.o.-year-old patient lying in the bed with no acute distress.  EYES: Pupils equal, round, reactive to light and accommodation. No scleral icterus. Extraocular muscles intact.  HEENT: Head atraumatic, normocephalic. Oropharynx and nasopharynx clear.  NECK:  Supple, no jugular venous distention. No thyroid enlargement, no tenderness.  LUNGS: Normal breath sounds bilaterally, no wheezing, rales,rhonchi or crepitation. No use of accessory muscles of respiration.  CARDIOVASCULAR: S1, S2 normal. No murmurs, rubs, or gallops.  ABDOMEN: Soft, nontender, nondistended. Bowel sounds present. No organomegaly or mass.  EXTREMITIES: No pedal edema, cyanosis,  or clubbing.  NEUROLOGIC: Cranial nerves II through XII are intact. Muscle strength 5/5 in all extremities. Sensation intact. Gait not checked.  PSYCHIATRIC: The patient is alert and oriented x 3.  SKIN: No obvious rash, lesion, or ulcer.    LABORATORY PANEL:   CBC Recent Labs  Lab 02/06/17 0854  WBC 4.8  HGB 7.8*  HCT 25.8*  PLT 59*   ------------------------------------------------------------------------------------------------------------------  Chemistries  Recent Labs  Lab 02/05/17 2009 02/06/17 0854  NA 129* 127*  K 3.9 3.6  CL 99* 100*  CO2 22 18*  GLUCOSE 173* 329*  BUN 16 18  CREATININE 0.93 0.92  CALCIUM 8.9 8.1*  AST 30  --   ALT 17  --   ALKPHOS 88  --   BILITOT 0.8  --    ------------------------------------------------------------------------------------------------------------------  Cardiac Enzymes No results for input(s): TROPONINI in the last 168 hours. ------------------------------------------------------------------------------------------------------------------  RADIOLOGY:  No results found.  EKG:   Orders placed or performed during the hospital encounter of 10/17/16  . ED EKG 12-Lead  . ED EKG 12-Lead    ASSESSMENT AND PLAN:    67 year old female patient with history of ESBL E. coli urinary tract infection, diabetes mellitus, anxiety disorder, emphysema, hyperlipidemia, hypertension presented to the emergency room with fever and low back pain, dysuria.   1.  Urinary tract infection Continue IV antibiotics and follow-up on the urine culture and sensitivity 2.  Low back pain pain meds as needed  3.  Hyponatremia provide IV fluids and check BMP in a.m.  4.  Thrombocytopenia 56,000 discontinued heparin- 5.  Hyperlipidemia check lipid panel 6.  Diabetes mellitus hemoglobin A1c 9.1 sliding scale insulin patient is hypoglycemic  improved poor p.o. intake      All the records are reviewed and case discussed with Care  Management/Social Workerr. Management plans discussed with the patient, family and they are in agreement.  CODE STATUS: Full code  TOTAL TIME TAKING CARE OF THIS PATIENT: 36 minutes.   POSSIBLE D/C IN 2  DAYS, DEPENDING ON CLINICAL CONDITION.  Note: This dictation was prepared with Dragon dictation along with smaller phrase technology. Any transcriptional errors that result from this process are unintentional.   Nicholes Mango M.D on 02/06/2017 at 4:25 PM  Between 7am to 6pm - Pager - [phone] After 6pm go to [email or web address] - password [email or web address] [facility]  [facility]  Office  [phone]  CC: Primary care physician; Adin Hector, MD

## 2017-02-06 NOTE — H&P (Signed)
[facility] [place] NAME: Vanessa Oneill    MR#:  [id number]  DATE OF BIRTH:  March 25, 1950  DATE OF ADMISSION:  02/05/2017  PRIMARY CARE PHYSICIAN: Adin Hector, MD   REQUESTING/REFERRING PHYSICIAN:   CHIEF COMPLAINT:   Chief Complaint  Patient presents with  . Back Pain  . Fever    HISTORY OF PRESENT ILLNESS: Vanessa Oneill  is a 67 y.o. female with a known history of ESBL E. coli urinary tract infection, GERD, anxiety disorder, emphysema, cerebral aneurysm, diabetes mellitus, fibrocystic breast disease, hyperlipidemia, hypertension presented to the emergency room with low back pain and fever.  Patient also has dysuria.  The back pain is aching in nature 3 out of 10 on a scale of 1-10.  Patient also has some chills.  She was evaluated in the emergency room was found to have urinary tract infection.  She was treated for ESBL E. coli UTI in the past.  Patient is resistant to multiple antibiotics and only sensitive to few IV antibiotics.  Hospitalist service was consulted.  PAST MEDICAL HISTORY:   Past Medical History:  Diagnosis Date  . Acid reflux   . Anxiety   . Arthritis   . Cerebral aneurysm   . COPD (chronic obstructive pulmonary disease) ([place])   . Depression   . Diabetes mellitus without complication ([place])   . Fibrocystic breast disease   . Hyperlipidemia   . Hypertension   . Liver disease   . Mitral valve regurgitation   . 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[place])   . Personal history of tobacco use, presenting hazards to health 11/12/2014  . Raynaud's disease   . Sleep apnea     PAST SURGICAL HISTORY:  Past Surgical History:  Procedure Laterality Date  . ABDOMINAL HYSTERECTOMY    . APPENDECTOMY    . BREAST BIOPSY Right    neg- core  . CHOLECYSTECTOMY    . ESOPHAGOGASTRODUODENOSCOPY (EGD) WITH PROPOFOL N/A 07/24/2016   Procedure: ESOPHAGOGASTRODUODENOSCOPY (EGD) WITH PROPOFOL;  Surgeon: Jonathon Bellows, MD;  Location: [facility]  ENDOSCOPY;  Service: Endoscopy;  Laterality: N/A;  . ESOPHAGOGASTRODUODENOSCOPY (EGD) WITH PROPOFOL N/A 11/21/2016   Procedure: ESOPHAGOGASTRODUODENOSCOPY (EGD) WITH PROPOFOL;  Surgeon: Tol[place], Benay Pike, MD;  Location: [facility] ENDOSCOPY;  Service: Gastroenterology;  Laterality: N/A;  . TONSILLECTOMY      SOCIAL HISTORY:  Social History   Tobacco Use  . Smoking status: Current Every Day Smoker    Packs/day: 1.00    Years: 48.00    Pack years: 48.00    Types: Cigarettes  . Smokeless tobacco: Never Used  Substance Use Topics  . Alcohol use: No    Alcohol/week: 0.0 oz    FAMILY HISTORY:  Family History  Problem Relation Age of Onset  . Lung cancer Father   . Heart Problems Father   . Heart failure Mother   . Breast cancer Neg Hx     DRUG ALLERGIES:  Allergies  Allergen Reactions  . Codeine   . Lipitor [Atorvastatin]   . Lisinopril   . Penicillins   . Tylenol With Codeine #3 [Acetaminophen-Codeine]   . Amoxicillin Itching and Rash    REVIEW OF SYSTEMS:   CONSTITUTIONAL: Has fever, fatigue and weakness.  EYES: No blurred or double vision.  EARS, NOSE, AND THROAT: No tinnitus or ear pain.  RESPIRATORY: No cough, shortness of breath, wheezing or hemoptysis.  CARDIOVASCULAR: No chest pain, orthopnea, edema.  GASTROINTESTINAL: No nausea, vomiting, diarrhea or abdominal pain.  GENITOURINARY: Has dysuria,  No hematuria.  ENDOCRINE: No polyuria, nocturia,  HEMATOLOGY: No anemia, easy bruising or bleeding SKIN: No rash or lesion. MUSCULOSKELETAL: No joint pain or arthritis.   Has back pain NEUROLOGIC: No tingling, numbness, weakness.  PSYCHIATRY: No anxiety or depression.   MEDICATIONS AT HOME:  Prior to Admission medications   Medication Sig Start Date End Date Taking? Authorizing Provider  ALPRAZolam Duanne Moron) 0.25 MG tablet Take 0.25 mg by mouth at bedtime as needed for sleep. 07/06/16   [provider]  conjugated estrogens (PREMARIN) vaginal cream Apply 0.5mg   (pea-sized amount)  just inside the vaginal introitus with a finger-tip on  Monday, Wednesday and Friday nights. 12/14/16   Zara Council A, PA-C  cyclobenzaprine (FLEXERIL) 10 MG tablet Take 10 mg by mouth 3 (three) times daily as needed for muscle spasms. 09/20/15   [provider]  ertapenem 1 g in sodium chloride 0.9 % 50 mL Inject 1 g into the vein daily. X 10 days Patient not taking: Reported on 11/20/2016 10/24/16   Gladstone Lighter, MD  feeding supplement, ENSURE ENLIVE, (ENSURE ENLIVE) LIQD Take 237 mLs by mouth 2 (two) times daily between meals. 10/24/16   Gladstone Lighter, MD  gabapentin (NEURONTIN) 100 MG capsule Take 100 mg by mouth 2 (two) times daily.    [provider]  glimepiride (AMARYL) 4 MG tablet Take 4 mg daily with breakfast by mouth.    [provider]  hydrochlorothiazide (HYDRODIURIL) 25 MG tablet Take 25 mg daily by mouth.    [provider]  HYDROcodone-acetaminophen (NORCO/VICODIN) 5-325 MG tablet Take 1 tablet by mouth every 6 (six) hours as needed for moderate pain.    [provider]  insulin NPH Human (HUMULIN N,NOVOLIN N) 100 UNIT/ML injection Inject 30 Units into the skin 2 (two) times daily before a meal. Use 30 units in the morning, 20 units during the day and 30 units at night.    [provider]  losartan (COZAAR) 100 MG tablet Take 100 mg daily by mouth.    [provider]  metFORMIN (GLUCOPHAGE-XR) 500 MG 24 hr tablet Take 1,000 mg by mouth daily. 03/07/16   [provider]  nadolol (CORGARD) 40 MG tablet Take 1 tablet (40 mg total) by mouth daily. 10/24/16   Gladstone Lighter, MD  nitrofurantoin, macrocrystal-monohydrate, (MACROBID) 100 MG capsule Take 1 capsule (100 mg total) by mouth every 12 (twelve) hours. 12/14/16   Zara Council A, PA-C  pantoprazole (PROTONIX) 40 MG tablet Take 40 mg by mouth 2 (two) times daily.     [provider]  rOPINIRole (REQUIP) 1 MG tablet  Take 1 mg by mouth at bedtime.     [provider]  traZODone (DESYREL) 50 MG tablet Take 50 mg by mouth at bedtime as needed for sleep. 06/19/16   [provider]      PHYSICAL EXAMINATION:   VITAL SIGNS: Blood pressure 126/67, pulse 79, temperature 99 F (37.2 C), temperature source Oral, resp. rate 16, height 5\' 7"  (1.702 m), weight 88 kg (194 lb), SpO2 99 %.  GENERAL:  67 y.o.-year-old patient lying in the bed with no acute distress.  EYES: Pupils equal, round, reactive to light and accommodation. No scleral icterus. Extraocular muscles intact.  HEENT: Head atraumatic, normocephalic. Oropharynx dry and nasopharynx clear.  NECK:  Supple, no jugular venous distention. No thyroid enlargement, no tenderness.  LUNGS: Normal breath sounds bilaterally, no wheezing, rales,rhonchi or crepitation. No use of accessory muscles of respiration.  CARDIOVASCULAR: S1, S2 normal. No murmurs, rubs, or gallops.  ABDOMEN: Soft, nontender, nondistended. Bowel sounds present. No organomegaly or mass.  Tenderness low back EXTREMITIES: No pedal edema, cyanosis, or clubbing.  NEUROLOGIC: Cranial nerves II through XII are intact. Muscle strength 5/5 in all extremities. Sensation intact. Gait not checked.  PSYCHIATRIC: The patient is alert and oriented x 3.  SKIN: No obvious rash, lesion, or ulcer.   LABORATORY PANEL:   CBC Recent Labs  Lab 02/05/17 2009  WBC 6.5  HGB 8.8*  HCT 28.0*  PLT 72*  MCV 67.8*  MCH 21.4*  MCHC 31.5*  RDW 18.6*   ------------------------------------------------------------------------------------------------------------------  Chemistries  Recent Labs  Lab 02/05/17 2009  NA 129*  K 3.9  CL 99*  CO2 22  GLUCOSE 173*  BUN 16  CREATININE 0.93  CALCIUM 8.9  AST 30  ALT 17  ALKPHOS 88  BILITOT 0.8   ------------------------------------------------------------------------------------------------------------------ estimated creatinine clearance is  68.7 mL/min (by C-G formula based on SCr of 0.93 mg/dL). ------------------------------------------------------------------------------------------------------------------ No results for input(s): TSH, T4TOTAL, T3FREE, THYROIDAB in the last 72 hours.  Invalid input(s): FREET3   Coagulation profile No results for input(s): INR, PROTIME in the last 168 hours. ------------------------------------------------------------------------------------------------------------------- No results for input(s): DDIMER in the last 72 hours. -------------------------------------------------------------------------------------------------------------------  Cardiac Enzymes No results for input(s): CKMB, TROPONINI, MYOGLOBIN in the last 168 hours.  Invalid input(s): CK ------------------------------------------------------------------------------------------------------------------ Invalid input(s): POCBNP  ---------------------------------------------------------------------------------------------------------------  Urinalysis    Component Value Date/Time   COLORURINE YELLOW (A) 02/05/2017 2009   APPEARANCEUR HAZY (A) 02/05/2017 2009   APPEARANCEUR Clear 12/14/2016 1213   LABSPEC 1.014 02/05/2017 2009   LABSPEC 1.002 11/28/2013 1732   PHURINE 5.0 02/05/2017 2009   GLUCOSEU NEGATIVE 02/05/2017 2009   GLUCOSEU Negative 11/28/2013 1732   HGBUR SMALL (A) 02/05/2017 2009   BILIRUBINUR NEGATIVE 02/05/2017 2009   BILIRUBINUR Negative 12/14/2016 1213   BILIRUBINUR Negative 11/28/2013 1732   [place] NEGATIVE 02/05/2017 2009   PROTEINUR NEGATIVE 02/05/2017 2009   NITRITE NEGATIVE 02/05/2017 2009   LEUKOCYTESUR SMALL (A) 02/05/2017 2009   LEUKOCYTESUR 1+ (A) 12/14/2016 1213   LEUKOCYTESUR Negative 11/28/2013 1732     RADIOLOGY: No results found.  EKG: Orders placed or performed during the hospital encounter of 10/17/16  . ED EKG 12-Lead  . ED EKG 12-Lead    IMPRESSION AND  PLAN: 67 year old female patient with history of ESBL E. coli urinary tract infection, diabetes mellitus, anxiety disorder, emphysema, hyperlipidemia, hypertension presented to the emergency room with fever and low back pain, dysuria.  Admitting diagnosis 1.  Urinary tract infection 2.  Low back pain 3.  Hyponatremia 4.  Thrombocytopenia 5.  Hyperlipidemia 6.  Diabetes mellitus Treatment plan Admit patient to medical floor Pharmacy consult for IV tobramycin antibiotic Follow-up urine cultures IV fluids Follow-up electrolytes Follow platelet count  All the records are reviewed and case discussed with ED provider. Management plans discussed with the patient, family and they are in agreement.  CODE STATUS:FULL CODE Code Status History    Date Active Date Inactive Code Status Order ID Comments User Context   10/18/2016 01:12 10/24/2016 20:41 Full Code [id number]  Demetrios Loll, MD Inpatient   07/22/2016 10:50 07/25/2016 20:42 Full Code [id number]  Bettey Costa, MD ED       TOTAL TIME TAKING CARE OF THIS PATIENT: 53 minutes.    Saundra Shelling M.D on 02/06/2017 at 1:17 AM  Between 7am to 6pm - Pager - [phone]  After 6pm go to [email or web address] - [place]  [facility]  Office  [phone]  CC: Primary care physician; Adin Hector, MD

## 2017-02-06 NOTE — Progress Notes (Signed)
[place] [facility] [place]    MR#:  [id number]  DATE OF BIRTH:  [birth year]  SUBJECTIVE:  CHIEF COMPLAINT: Patient is reporting back pain and burning micturition but denies any nausea  REVIEW OF SYSTEMS:  CONSTITUTIONAL: No fever, fatigue or weakness.  EYES: No blurred or double vision.  EARS, NOSE, AND THROAT: No tinnitus or ear pain.  RESPIRATORY: No cough, shortness of breath, wheezing or hemoptysis.  CARDIOVASCULAR: No chest pain, orthopnea, edema.  GASTROINTESTINAL: No nausea, vomiting, diarrhea or abdominal pain.  GENITOURINARY: No dysuria, hematuria.  ENDOCRINE: No polyuria, nocturia,  HEMATOLOGY: No anemia, easy bruising or bleeding SKIN: No rash or lesion. MUSCULOSKELETAL: No joint pain or arthritis.   NEUROLOGIC: No tingling, numbness, weakness.  PSYCHIATRY: No anxiety or depression.   DRUG ALLERGIES:   Allergies  Allergen Reactions  . Codeine   . Lipitor [Atorvastatin]   . Lisinopril   . Penicillins   . Tylenol With Codeine #3 [Acetaminophen-Codeine]   . Amoxicillin Itching and Rash    VITALS:  Blood pressure (!) 116/53, pulse 65, temperature 98 F (36.7 C), temperature source Oral, resp. rate 16, height 5\' 7"  (1.702 m), weight 92.3 kg (203 lb 6.4 oz), SpO2 100 %.  PHYSICAL EXAMINATION:  GENERAL:  67 y.o.-year-old patient lying in the bed with no acute distress.  EYES: Pupils equal, round, reactive to light and accommodation. No scleral icterus. Extraocular muscles intact.  HEENT: Head atraumatic, normocephalic. Oropharynx and nasopharynx clear.  NECK:  Supple, no jugular venous distention. No thyroid enlargement, no tenderness.  LUNGS: Normal breath sounds bilaterally, no wheezing, rales,rhonchi or crepitation. No use of accessory muscles of respiration.  CARDIOVASCULAR: S1, S2 normal. No murmurs, rubs, or gallops.  ABDOMEN: Soft, nontender, nondistended. Bowel sounds present. EXTREMITIES: No  pedal edema, cyanosis, or clubbing.  NEUROLOGIC: Cranial nerves II through XII are intact. Muscle strength 5/5 in all extremities. Sensation intact. Gait not checked.  PSYCHIATRIC: The patient is alert and oriented x 3.  SKIN: No obvious rash, lesion, or ulcer.    LABORATORY PANEL:   CBC Recent Labs  Lab 02/06/17 0854  WBC 4.8  HGB 7.8*  HCT 25.8*  PLT 59*   ------------------------------------------------------------------------------------------------------------------  Chemistries  Recent Labs  Lab 02/05/17 2009 02/06/17 0854  NA 129* 127*  K 3.9 3.6  CL 99* 100*  CO2 22 18*  GLUCOSE 173* 329*  BUN 16 18  CREATININE 0.93 0.92  CALCIUM 8.9 8.1*  AST 30  --   ALT 17  --   ALKPHOS 88  --   BILITOT 0.8  --    ------------------------------------------------------------------------------------------------------------------  Cardiac Enzymes No results for input(s): TROPONINI in the last 168 hours. ------------------------------------------------------------------------------------------------------------------  RADIOLOGY:  No results found.  EKG:   Orders placed or performed during the hospital encounter of 10/17/16  . ED EKG 12-Lead  . ED EKG 12-Lead    ASSESSMENT AND PLAN:   67 year old female patient with history of ESBL E. coli urinary tract infection, diabetes mellitus, anxiety disorder, emphysema, hyperlipidemia, hypertension presented to the emergency room with fever and low back pain, dysuria.    1.  Urinary tract infection with history of ESBL E. coli in the past Provide IV fluids, symptomatic treatment Invanz Follow-up urine culture and sensitivity  2.  Low back pain from UTI.  Pain meds as needed  3.  Hyponatremia from dehydration provide IV fluids and recheck BMP in a.m. Sodium 127 patient is mentating fine.  4.  Thrombocytopenia-no bleeding or bruises check CBC in a.m. Discontinued heparin   5.  Hypertension patient's Cozaar and  nadolol and titrate as needed  6.  Diabetes mellitus-discontinue metformin.  Provide sliding scale insulin      All the records are reviewed and case discussed with Care Management/Social Workerr. Management plans discussed with the patient, family and they are in agreement.  CODE STATUS: fc   TOTAL TIME TAKING CARE OF THIS PATIENT: 35  minutes.   POSSIBLE D/C IN 1-2  DAYS, DEPENDING ON CLINICAL CONDITION.  Note: This dictation was prepared with Dragon dictation along with smaller phrase technology. Any transcriptional errors that result from this process are unintentional.   Nicholes Mango M.D on 02/06/2017 at 4:29 PM  Between 7am to 6pm - Pager - [phone] After 6pm go to [email or web address] - password [email or web address] [facility]  [place] [facility]  Office  [phone]  CC: Primary care physician; Adin Hector, MD

## 2017-02-06 NOTE — Care Management Obs Status (Signed)
[place] NOTIFICATION   Patient Details  Name: Vanessa Oneill MRN: [id number] Date of Birth: April 10, 1950   Medicare Observation Status Notification Given:  Yes: Permission from Patient [place], RN 02/06/2017, 1:44 PM

## 2017-02-06 NOTE — Progress Notes (Signed)
Contacted pharmacy re: allergies and prescription medications. Pharmacist confirmed patient had received tylenol without complication 10/3011. Pharmacist confirmed non-specified allergy re: losartan.

## 2017-02-06 NOTE — Progress Notes (Signed)
Pharmacy Antibiotic Note  Vanessa Oneill is a 67 y.o. female admitted on 02/05/2017 with UTI with ESBL hx.  Pharmacy has been consulted for tobramycin dosing.  Plan: TBW 88kg  ABW 72kg 500 mg q 24 hours extended interval tobramycin ordered. Level ordered ~8 hours after start of infusion.  Height: 5\' 7"  (170.2 cm) Weight: 194 lb (88 kg) IBW/kg (Calculated) : 61.6  Temp (24hrs), Avg:99 F (37.2 C), Min:98.8 F (37.1 C), Max:99.2 F (37.3 C)  Recent Labs  Lab 02/05/17 2009 02/05/17 2010 02/05/17 2339  WBC 6.5  --   --   CREATININE 0.93  --   --   LATICACIDVEN  --  1.7 1.2    Estimated Creatinine Clearance: 68.7 mL/min (by C-G formula based on SCr of 0.93 mg/dL).    Allergies  Allergen Reactions  . Codeine   . Lipitor [Atorvastatin]   . Lisinopril   . Penicillins   . Tylenol With Codeine #3 [Acetaminophen-Codeine]   . Amoxicillin Itching and Rash    Antimicrobials this admission: Aztreonam x1, tobramycin 2.5  >>    >>   Dose adjustments this admission:   Microbiology results: 2/4 BCx: pendign 2/4 UCx: pending       2/4 UA: LE (+) NO2(-)  WBC TNTC Thank you for allowing pharmacy to be a part of this patient's care.  [name] S 02/06/2017 3:35 AM

## 2017-02-06 NOTE — Progress Notes (Signed)
Pharmacy Antibiotic Note  Vanessa Oneill is a 67 y.o. female admitted on 02/05/2017 with UTI and patient with history of ESBL infection in urine on 12/13.  Pharmacy has been consulted for meropenem dosing.  Plan: Will initiate patient on meropenem 1g IV Q8hr.    Height: 5\' 7"  (170.2 cm) Weight: 203 lb 6.4 oz (92.3 kg) IBW/kg (Calculated) : 61.6  Temp (24hrs), Avg:100.2 F (37.9 C), Min:98.8 F (37.1 C), Max:103 F (39.4 C)  Recent Labs  Lab 02/05/17 2009 02/05/17 2010 02/05/17 2339 02/06/17 0854  WBC 6.5  --   --  4.8  CREATININE 0.93  --   --  0.92  LATICACIDVEN  --  1.7 1.2  --     Estimated Creatinine Clearance: 71.1 mL/min (by C-G formula based on SCr of 0.92 mg/dL).    Allergies  Allergen Reactions  . Codeine   . Lipitor [Atorvastatin]   . Lisinopril   . Penicillins   . Tylenol With Codeine #3 [Acetaminophen-Codeine]   . Amoxicillin Itching and Rash    Antimicrobials this admission: Aztreonam x 1 2/5 Tobramycin x 1 2/5 Meropenem 2/5 >>   Dose adjustments this admission: N/A  Microbiology results: 2/4 BCx: no growth < 12 hours  2/4 UCx: sent   Thank you for allowing pharmacy to be a part of this patient's care.  Hortencia Martire L 02/06/2017 1:38 PM

## 2017-02-06 NOTE — Progress Notes (Signed)
Patient temp 103, confirmed blood cultures drawn, on IV antibiotics. To administer tylenol and recheck temp.

## 2017-02-06 NOTE — Plan of Care (Signed)
  Progressing Education: Knowledge of General Education information will improve 02/06/2017 1708 - Progressing by Daylene Posey, RN Health Behavior/Discharge Planning: Ability to manage health-related needs will improve 02/06/2017 1708 - Progressing by Daylene Posey, RN Clinical Measurements: Ability to maintain clinical measurements within normal limits will improve 02/06/2017 1708 - Progressing by Daylene Posey, RN Will remain free from infection 02/06/2017 1708 - Progressing by Daylene Posey, RN Diagnostic test results will improve 02/06/2017 1708 - Progressing by Daylene Posey, RN Respiratory complications will improve 02/06/2017 1708 - Progressing by Daylene Posey, RN Cardiovascular complication will be avoided 02/06/2017 1708 - Progressing by Daylene Posey, RN Activity: Risk for activity intolerance will decrease 02/06/2017 1708 - Progressing by Daylene Posey, RN Nutrition: Adequate nutrition will be maintained 02/06/2017 1708 - Progressing by Daylene Posey, RN Coping: Level of anxiety will decrease 02/06/2017 1708 - Progressing by Daylene Posey, RN Elimination: Will not experience complications related to bowel motility 02/06/2017 1708 - Progressing by Daylene Posey, RN Will not experience complications related to urinary retention 02/06/2017 1708 - Progressing by Daylene Posey, RN Pain Managment: General experience of comfort will improve 02/06/2017 1708 - Progressing by Daylene Posey, RN Safety: Ability to remain free from injury will improve 02/06/2017 1708 - Progressing by Daylene Posey, RN Skin Integrity: Risk for impaired skin integrity will decrease 02/06/2017 1708 - Progressing by Daylene Posey, RN

## 2017-02-06 NOTE — Progress Notes (Signed)
Inpatient Diabetes Program Recommendations  AACE/ADA: New Consensus Statement on Inpatient Glycemic Control (2015)  Target Ranges:  Prepandial:   less than 140 mg/dL      Peak postprandial:   less than 180 mg/dL (1-2 hours)      Critically ill patients:  140 - 180 mg/dL   Lab Results  Component Value Date   GLUCAP 195 (H) 11/21/2016   HGBA1C 8.2 (H) 07/22/2016    Review of Glycemic ControlResults for KATALEAH, BEJAR (MRN [id number]) as of 02/06/2017 10:58  Ref. Range 02/05/2017 20:09 02/06/2017 08:54  Glucose Latest Ref Range: 65 - 99 mg/dL 173 (H) 329 (H)   Diabetes history: Type 2 DM Outpatient Diabetes medications: Amaryl 4 mg daily, NPH 30 units AM/NPH 20 units mid-day/NPH 30 units q PM, Humulin R 30 units tid with meals Current orders for Inpatient glycemic control:  Amaryl 4 mg daily with breakfast, Metformin 1000 mg bid  Inpatient Diabetes Program Recommendations:   Please add insulin while in the hospital.  Consider holding oral agents.   Please consider Levemir 20 units bid, Novolog moderate correction tid with meals and HS, and Novolog 6 units tid with meals. Will text page MD.   Thanks,  Adah Perl, RN, BC-ADM Inpatient Diabetes Coordinator Pager [phone] (8a-5p)

## 2017-02-07 LAB — CBC
HCT: 24 % — ABNORMAL LOW (ref 35.0–47.0)
Hemoglobin: 7.5 g/dL — ABNORMAL LOW (ref 12.0–16.0)
MCH: 21.4 pg — ABNORMAL LOW (ref 26.0–34.0)
MCHC: 31.1 g/dL — AB (ref 32.0–36.0)
MCV: 68.8 fL — ABNORMAL LOW (ref 80.0–100.0)
PLATELETS: 58 10*3/uL — AB (ref 150–440)
RBC: 3.48 MIL/uL — ABNORMAL LOW (ref 3.80–5.20)
RDW: 18.8 % — AB (ref 11.5–14.5)
WBC: 3.8 10*3/uL (ref 3.6–11.0)

## 2017-02-07 LAB — BLOOD CULTURE ID PANEL (REFLEXED)
Acinetobacter baumannii: NOT DETECTED
CANDIDA GLABRATA: NOT DETECTED
CANDIDA KRUSEI: NOT DETECTED
CANDIDA PARAPSILOSIS: NOT DETECTED
CANDIDA TROPICALIS: NOT DETECTED
Candida albicans: NOT DETECTED
ENTEROBACTER CLOACAE COMPLEX: NOT DETECTED
ESCHERICHIA COLI: NOT DETECTED
Enterobacteriaceae species: NOT DETECTED
Enterococcus species: NOT DETECTED
Haemophilus influenzae: NOT DETECTED
KLEBSIELLA OXYTOCA: NOT DETECTED
KLEBSIELLA PNEUMONIAE: NOT DETECTED
Listeria monocytogenes: NOT DETECTED
Neisseria meningitidis: NOT DETECTED
Proteus species: NOT DETECTED
Pseudomonas aeruginosa: NOT DETECTED
STREPTOCOCCUS PNEUMONIAE: NOT DETECTED
Serratia marcescens: NOT DETECTED
Staphylococcus aureus (BCID): NOT DETECTED
Staphylococcus species: NOT DETECTED
Streptococcus agalactiae: NOT DETECTED
Streptococcus pyogenes: NOT DETECTED
Streptococcus species: NOT DETECTED

## 2017-02-07 LAB — URINE CULTURE

## 2017-02-07 LAB — BASIC METABOLIC PANEL
Anion gap: 10 (ref 5–15)
BUN: 15 mg/dL (ref 6–20)
CHLORIDE: 104 mmol/L (ref 101–111)
CO2: 19 mmol/L — ABNORMAL LOW (ref 22–32)
CREATININE: 0.82 mg/dL (ref 0.44–1.00)
Calcium: 8 mg/dL — ABNORMAL LOW (ref 8.9–10.3)
GFR calc Af Amer: >60 mL/min (ref >60)
GFR calc non Af Amer: >60 mL/min (ref >60)
GLUCOSE: 117 mg/dL — AB (ref 65–99)
POTASSIUM: 3.5 mmol/L (ref 3.5–5.1)
SODIUM: 133 mmol/L — AB (ref 135–145)

## 2017-02-07 LAB — HEMOGLOBIN A1C
Hgb A1c MFr Bld: 9.1 % — ABNORMAL HIGH (ref 4.8–5.6)
Mean Plasma Glucose: 214.47 mg/dL

## 2017-02-07 LAB — GLUCOSE, CAPILLARY
GLUCOSE-CAPILLARY: 63 mg/dL — AB (ref 65–99)
GLUCOSE-CAPILLARY: 82 mg/dL (ref 65–99)
GLUCOSE-CAPILLARY: 94 mg/dL (ref 65–99)
Glucose-Capillary: 149 mg/dL — ABNORMAL HIGH (ref 65–99)
Glucose-Capillary: 150 mg/dL — ABNORMAL HIGH (ref 65–99)

## 2017-02-07 LAB — INFLUENZA PANEL BY PCR (TYPE A & B)
INFLAPCR: NEGATIVE
INFLBPCR: NEGATIVE

## 2017-02-07 MED ORDER — INSULIN DETEMIR 100 UNIT/ML ~~LOC~~ SOLN: 20.0000 [IU] | Freq: Every day | SUBCUTANEOUS | Status: DC

## 2017-02-07 NOTE — Progress Notes (Signed)
Inpatient Diabetes Program Recommendations  AACE/ADA: New Consensus Statement on Inpatient Glycemic Control (2015)  Target Ranges:  Prepandial:   less than 140 mg/dL      Peak postprandial:   less than 180 mg/dL (1-2 hours)      Critically ill patients:  140 - 180 mg/dL   Lab Results  Component Value Date   GLUCAP 82 02/07/2017   HGBA1C 8.2 (H) 07/22/2016    Review of Glycemic ControlResults for Vanessa Oneill, Vanessa Oneill (MRN [id number]) as of 02/07/2017 10:30  Ref. Range 02/06/2017 12:57 02/06/2017 17:26 02/06/2017 20:51 02/07/2017 07:50 02/07/2017 08:39  Glucose-Capillary Latest Ref Range: 65 - 99 mg/dL 399 (H) 165 (H) 131 (H) 63 (L) 82   Diabetes history: Type 2 DM Outpatient Diabetes medications: Amaryl 4 mg daily, NPH 30 units AM/NPH 20 units mid-day/NPH 30 units q PM, Humulin R 30 units tid with meals Current orders for Inpatient glycemic control:  Levemir 20 units bid, Novolog moderate tid with meals and HS, Novolog 6 units tid with meals Amaryl 4 mg daily Inpatient Diabetes Program Recommendations:    Please reduce Levemir to 20 units once a day and d/c Amaryl while patient is in the hospital.  Text page sent to MD.   Thanks  Adah Perl, RN, BC-ADM Inpatient Diabetes Coordinator Pager [phone] (8a-5p)

## 2017-02-07 NOTE — Progress Notes (Signed)
PHARMACY - PHYSICIAN COMMUNICATION CRITICAL VALUE ALERT - BLOOD CULTURE IDENTIFICATION (BCID)  Vanessa Oneill is an 67 y.o. female who presented to [facility] on 02/05/2017 with a chief complaint of UTI.  Patient currently on meropenem for UTI with a history of ESBL E.coli UTI. Pending urine culture.  Previous BCID note showed detection of 1/4 E. Coli, but is now showing no species detected. Confirmed with lab that the BCID panel did not detect a specific species in this patients blood culture. Gram stain: 1/4 GPC  Results for orders placed or performed during the hospital encounter of 02/05/17  Blood Culture ID Panel (Reflexed) (Collected: 02/05/2017 11:39 PM)  Result Value Ref Range   Enterococcus species NOT DETECTED NOT DETECTED   Listeria monocytogenes NOT DETECTED NOT DETECTED   Staphylococcus species NOT DETECTED NOT DETECTED   Staphylococcus aureus NOT DETECTED NOT DETECTED   Streptococcus species NOT DETECTED NOT DETECTED   Streptococcus agalactiae NOT DETECTED NOT DETECTED   Streptococcus pneumoniae NOT DETECTED NOT DETECTED   Streptococcus pyogenes NOT DETECTED NOT DETECTED   Acinetobacter baumannii NOT DETECTED NOT DETECTED   Enterobacteriaceae species NOT DETECTED NOT DETECTED   Enterobacter cloacae complex NOT DETECTED NOT DETECTED   Escherichia coli NOT DETECTED NOT DETECTED   Klebsiella oxytoca NOT DETECTED NOT DETECTED   Klebsiella pneumoniae NOT DETECTED NOT DETECTED   Proteus species NOT DETECTED NOT DETECTED   Serratia marcescens NOT DETECTED NOT DETECTED   Haemophilus influenzae NOT DETECTED NOT DETECTED   Neisseria meningitidis NOT DETECTED NOT DETECTED   Pseudomonas aeruginosa NOT DETECTED NOT DETECTED   Candida albicans NOT DETECTED NOT DETECTED   Candida glabrata NOT DETECTED NOT DETECTED   Candida krusei NOT DETECTED NOT DETECTED   Candida parapsilosis NOT DETECTED NOT DETECTED   Candida tropicalis NOT DETECTED NOT [place], PharmD,  BCPS Clinical Pharmacist 02/07/2017  8:45 AM

## 2017-02-07 NOTE — Progress Notes (Signed)
PHARMACY - PHYSICIAN COMMUNICATION CRITICAL VALUE ALERT - BLOOD CULTURE IDENTIFICATION (BCID)  Vanessa Oneill is an 67 y.o. female who presented to [facility] on 02/05/2017 with a chief complaint of UTI  Assessment:  GPC 1/4 anaerobic. No BCID. Also as below (include suspected source if known)  Name of physician (or Provider) Contacted: Pyreddy  Current antibiotics: meropenem  Changes to prescribed antibiotics recommended:  n/a  Results for orders placed or performed during the hospital encounter of 10/17/16  Blood Culture ID Panel (Reflexed) (Collected: 10/17/2016  9:21 PM)  Result Value Ref Range   Enterococcus species NOT DETECTED NOT DETECTED   Listeria monocytogenes NOT DETECTED NOT DETECTED   Staphylococcus species NOT DETECTED NOT DETECTED   Staphylococcus aureus NOT DETECTED NOT DETECTED   Streptococcus species NOT DETECTED NOT DETECTED   Streptococcus agalactiae NOT DETECTED NOT DETECTED   Streptococcus pneumoniae NOT DETECTED NOT DETECTED   Streptococcus pyogenes NOT DETECTED NOT DETECTED   Acinetobacter baumannii NOT DETECTED NOT DETECTED   Enterobacteriaceae species DETECTED (A) NOT DETECTED   Enterobacter cloacae complex NOT DETECTED NOT DETECTED   Escherichia coli DETECTED (A) NOT DETECTED   Klebsiella oxytoca NOT DETECTED NOT DETECTED   Klebsiella pneumoniae NOT DETECTED NOT DETECTED   Proteus species NOT DETECTED NOT DETECTED   Serratia marcescens NOT DETECTED NOT DETECTED   Carbapenem resistance NOT DETECTED NOT DETECTED   Haemophilus influenzae NOT DETECTED NOT DETECTED   Neisseria meningitidis NOT DETECTED NOT DETECTED   Pseudomonas aeruginosa NOT DETECTED NOT DETECTED   Candida albicans NOT DETECTED NOT DETECTED   Candida glabrata NOT DETECTED NOT DETECTED   Candida krusei NOT DETECTED NOT DETECTED   Candida parapsilosis NOT DETECTED NOT DETECTED   Candida tropicalis NOT DETECTED NOT DETECTED    Vanessa Oneill 02/07/2017  6:41 AM

## 2017-02-07 NOTE — Progress Notes (Signed)
Pt with low BG of 63 this am. Pt asymptomatic. 4 oz of orange juice given. Recheck BG of 83. Will continue to monitor. Ammie Dalton, RN

## 2017-02-07 NOTE — Progress Notes (Signed)
[facility] [place]    MR#:  [id number]  DATE OF BIRTH:  06/18/1950  SUBJECTIVE:  CHIEF COMPLAINT: Patient is reporting back pain, general body aches and feeling miserable REVIEW OF SYSTEMS:  CONSTITUTIONAL: No fever,   Reporting body aches EYES: No blurred or double vision.  EARS, NOSE, AND THROAT: No tinnitus or ear pain.  RESPIRATORY: No cough, shortness of breath, wheezing or hemoptysis.  CARDIOVASCULAR: No chest pain, orthopnea, edema.  GASTROINTESTINAL: No nausea, vomiting, diarrhea or abdominal pain.  GENITOURINARY: No dysuria, hematuria.  ENDOCRINE: No polyuria, nocturia,  HEMATOLOGY: No anemia, easy bruising or bleeding SKIN: No rash or lesion. MUSCULOSKELETAL: No joint pain or arthritis.   NEUROLOGIC: No tingling, numbness, weakness.  PSYCHIATRY: No anxiety or depression.   DRUG ALLERGIES:   Allergies  Allergen Reactions  . Codeine   . Lipitor [Atorvastatin]   . Lisinopril   . Penicillins   . Tylenol With Codeine #3 [Acetaminophen-Codeine]   . Amoxicillin Itching and Rash    VITALS:  Blood pressure (!) 120/49, pulse 73, temperature 100.3 F (37.9 C), temperature source Oral, resp. rate 18, height 5\' 7"  (1.702 m), weight 92.3 kg (203 lb 6.4 oz), SpO2 93 %.  PHYSICAL EXAMINATION:  GENERAL:  67 y.o.-year-old patient lying in the bed with no acute distress.  EYES: Pupils equal, round, reactive to light and accommodation. No scleral icterus. Extraocular muscles intact.  HEENT: Head atraumatic, normocephalic. Oropharynx and nasopharynx clear.  NECK:  Supple, no jugular venous distention. No thyroid enlargement, no tenderness.  LUNGS: Normal breath sounds bilaterally, no wheezing, rales,rhonchi or crepitation. No use of accessory muscles of respiration.  CARDIOVASCULAR: S1, S2 normal. No murmurs, rubs, or gallops.  ABDOMEN: Soft, nontender, nondistended. Bowel sounds present. No organomegaly or  mass.  EXTREMITIES: No pedal edema, cyanosis, or clubbing.  NEUROLOGIC: Cranial nerves II through XII are intact. Muscle strength generalized weakness in all extremities. Sensation intact. Gait not checked.  PSYCHIATRIC: The patient is alert and oriented x 3.  SKIN: No obvious rash, lesion, or ulcer.    LABORATORY PANEL:   CBC Recent Labs  Lab 02/07/17 0412  WBC 3.8  HGB 7.5*  HCT 24.0*  PLT 58*   ------------------------------------------------------------------------------------------------------------------  Chemistries  Recent Labs  Lab 02/05/17 2009  02/07/17 0412  NA 129*   < > 133*  K 3.9   < > 3.5  CL 99*   < > 104  CO2 22   < > 19*  GLUCOSE 173*   < > 117*  BUN 16   < > 15  CREATININE 0.93   < > 0.82  CALCIUM 8.9   < > 8.0*  AST 30  --   --   ALT 17  --   --   ALKPHOS 88  --   --   BILITOT 0.8  --   --    < > = values in this interval not displayed.   ------------------------------------------------------------------------------------------------------------------  Cardiac Enzymes No results for input(s): TROPONINI in the last 168 hours. ------------------------------------------------------------------------------------------------------------------  RADIOLOGY:  No results found.  EKG:   Orders placed or performed during the hospital encounter of 10/17/16  . ED EKG 12-Lead  . ED EKG 12-Lead    ASSESSMENT AND PLAN:    67 year old female patient with history of ESBL E. coli urinary tract infection, diabetes mellitus, anxiety disorder, emphysema, hyperlipidemia, hypertension presented to the emergency room with fever and low back pain, dysuria.  1.  Urinary tract infection Urine culture with less than 10,000 colonies of growth, acute UTI ruled out  2.    Generalized weakness with body aches probably acute viral syndrome Blood cultures 1 bottle with gram-positive cocci but second bottle is negative Discontinued IV antibiotics and patient is  started on p.o. Keflex while blood cultures are pending.  Flu test is negative  3.  Hyponatremia provide IV fluids and sodium at 133  4.  Thrombocytopenia 56,000 discontinued heparin-could be from acute viral syndrome repeat CBC in a.m.  5.  Hyperlipidemia check lipid panel  6.  Diabetes mellitus hemoglobin A1c 9.1 sliding scale insulin patient is hypoglycemic improved poor p.o. intake  Generalized weakness PT consult is pending    All the records are reviewed and case discussed with Care Management/Social Workerr. Management plans discussed with the patient, family and they are in agreement.  CODE STATUS: Full code  TOTAL TIME TAKING CARE OF THIS PATIENT: 36 minutes.   POSSIBLE D/C IN 2  DAYS, DEPENDING ON CLINICAL CONDITION.  Note: This dictation was prepared with Dragon dictation along with smaller phrase technology. Any transcriptional errors that result from this process are unintentional.   Nicholes Mango M.D on 02/07/2017 at 3:03 PM  Between 7am to 6pm - Pager - [phone] After 6pm go to [email or web address] - password [email or web address] [facility]  [place] [facility]  Office  [phone]  CC: Primary care physician; Adin Hector, MD

## 2017-02-07 NOTE — Evaluation (Signed)
Physical Therapy Evaluation Patient Details Name: Vanessa Oneill MRN: [id number] DOB: Jul 29, 1950 Today's Date: 02/07/2017   History of Present Illness  Pt is a 67 year old female with history of ESBL E. coli urinary tract infection, diabetes mellitus, anxiety disorder, emphysema, hyperlipidemia, hypertension presented to the emergency room with fever and low back pain, dysuria.  Assessment includes: UTI, generalized weakness with body aches probably acute viral syndrome, hyponatremia, thrombocytopenia, HLD, and DM.    Clinical Impression  Pt presents with deficits in strength, transfers, mobility, gait, balance, and activity tolerance.  Pt with general body pain but primarily in the back and BLEs causing most functional tasks to be performed slowly and cautiously with increased effort.  Pt generally steady during transfers and gait but with increased effort and decreased cadence during amb.  Pt able to amb 30' max before requiring to return to sitting.  Pt will benefit from HHPT services upon discharge to safely address above deficits for decreased caregiver assistance and eventual return to PLOF.      Follow Up Recommendations Home health PT    Equipment Recommendations  None recommended by PT    Recommendations for Other Services       Precautions / Restrictions Precautions Precautions: Fall Restrictions Weight Bearing Restrictions: No      Mobility  Bed Mobility Overal bed mobility: Modified Independent             General bed mobility comments: Extra time and effort during bed mobility tasks but no physical assistance required  Transfers Overall transfer level: Needs assistance Equipment used: None Transfers: Sit to/from Stand Sit to Stand: Supervision         General transfer comment: Good control and stabililty with transfers  Ambulation/Gait Ambulation/Gait assistance: Supervision Ambulation Distance (Feet): 30 Feet Assistive device: None Gait  Pattern/deviations: Step-through pattern;Decreased stride length   Gait velocity interpretation: Below normal speed for age/gender General Gait Details: Slow, effortful cadence with amb with short B step length but steady without LOB; poor activity tolerance with amb  Stairs            Wheelchair Mobility    Modified Rankin (Stroke Patients Only)       Balance Overall balance assessment: Needs assistance   Sitting balance-Leahy Scale: Normal     Standing balance support: No upper extremity supported Standing balance-Leahy Scale: Good                               Pertinent Vitals/Pain Pain Assessment: 0-10 Pain Score: 4  Pain Location: Back and BLEs Pain Descriptors / Indicators: Aching;Sore Pain Intervention(s): Premedicated before session;Limited activity within patient's tolerance;Monitored during session    [place] expects to be discharged to:: Private residence Living Arrangements: Spouse/significant other Available Help at Discharge: Family;Available 24 hours/day Type of Home: House Home Access: Ramped entrance     Home Layout: Two level;Able to live on main level with bedroom/bathroom Home Equipment: None      Prior Function Level of Independence: Independent         Comments: Pt reports being independent with amb without AD and with all ADLs, 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  One recent fall, unsure of cause, no other fall history.      Hand Dominance   Dominant Hand: Right    Extremity/Trunk Assessment   Upper Extremity Assessment Upper Extremity Assessment: Overall WFL for tasks assessed    Lower Extremity Assessment Lower  Extremity Assessment: Generalized weakness       Communication   Communication: No difficulties  Cognition Arousal/Alertness: Awake/alert Behavior During Therapy: WFL for tasks assessed/performed Overall Cognitive Status: Within Functional Limits for tasks assessed                                         General Comments      Exercises Total Joint Exercises Ankle Circles/Pumps: AROM;Both;10 reps Quad Sets: Strengthening;Both;10 reps Gluteal Sets: Strengthening;Both;10 reps Long Arc Quad: AROM;Both;10 reps Knee Flexion: AROM;Both;10 reps Other Exercises Other Exercises: Log roll training to minimize back pain during sup to/from sit   Assessment/Plan    PT Assessment Patient needs continued PT services  PT Problem List Decreased strength;Decreased activity tolerance;Decreased balance;Decreased mobility;Pain       PT Treatment Interventions Gait training;Functional mobility training;Balance training;Therapeutic exercise;Therapeutic activities;Patient/family education    PT Goals (Current goals can be found in the Care Plan section)  Acute Rehab PT Goals Patient Stated Goal: To walk better with decreased pain PT Goal Formulation: With patient Time For Goal Achievement: 02/20/17 Potential to Achieve Goals: Good    Frequency Min 2X/week   Barriers to discharge        Co-evaluation               AM-PAC PT "6 Clicks" Daily Activity  Outcome Measure Difficulty turning over in bed (including adjusting bedclothes, sheets and blankets)?: A Little Difficulty moving from lying on back to sitting on the side of the bed? : A Little Difficulty sitting down on and standing up from a chair with arms (e.g., wheelchair, bedside commode, etc,.)?: None Help needed moving to and from a bed to chair (including a wheelchair)?: None Help needed walking in hospital room?: A Little Help needed climbing 3-5 steps with a railing? : A Little 6 Click Score: 20    End of Session Equipment Utilized During Treatment: Gait belt Activity Tolerance: Patient limited by fatigue Patient left: in bed;with call bell/phone within reach;with bed alarm set;with family/visitor present Nurse Communication: Mobility status PT Visit Diagnosis:  Difficulty in walking, not elsewhere classified (R26.2);Muscle weakness (generalized) (M62.81)    Time: 9417-4081 PT Time Calculation (min) (ACUTE ONLY): 23 min   Charges:   PT Evaluation $PT Eval Low Complexity: 1 Low PT Treatments $Therapeutic Exercise: 8-22 mins   PT G Codes:        DRoyetta Asal PT, DPT 02/07/17, 4:00 PM

## 2017-02-08 LAB — CBC
HCT: 26.8 % — ABNORMAL LOW (ref 35.0–47.0)
Hemoglobin: 8.2 g/dL — ABNORMAL LOW (ref 12.0–16.0)
MCH: 21 pg — AB (ref 26.0–34.0)
MCHC: 30.5 g/dL — ABNORMAL LOW (ref 32.0–36.0)
MCV: 68.8 fL — ABNORMAL LOW (ref 80.0–100.0)
PLATELETS: 70 10*3/uL — AB (ref 150–440)
RBC: 3.9 MIL/uL (ref 3.80–5.20)
RDW: 18.6 % — ABNORMAL HIGH (ref 11.5–14.5)
WBC: 3 10*3/uL — AB (ref 3.6–11.0)

## 2017-02-08 LAB — BASIC METABOLIC PANEL
Anion gap: 8 (ref 5–15)
BUN: 11 mg/dL (ref 6–20)
CO2: 22 mmol/L (ref 22–32)
Calcium: 8.5 mg/dL — ABNORMAL LOW (ref 8.9–10.3)
Chloride: 103 mmol/L (ref 101–111)
Creatinine, Ser: 0.78 mg/dL (ref 0.44–1.00)
GFR calc Af Amer: >60 mL/min (ref >60)
Glucose, Bld: 167 mg/dL — ABNORMAL HIGH (ref 65–99)
POTASSIUM: 3.6 mmol/L (ref 3.5–5.1)
Sodium: 133 mmol/L — ABNORMAL LOW (ref 135–145)

## 2017-02-08 LAB — LIPID PANEL
CHOL/HDL RATIO: 7 ratio
CHOLESTEROL: 119 mg/dL (ref 0–200)
HDL: 17 mg/dL — ABNORMAL LOW (ref >40)
LDL CALC: 69 mg/dL (ref 0–99)
Triglycerides: 163 mg/dL — ABNORMAL HIGH (ref <150)
VLDL: 33 mg/dL (ref 0–40)

## 2017-02-08 LAB — GLUCOSE, CAPILLARY: GLUCOSE-CAPILLARY: 138 mg/dL — AB (ref 65–99)

## 2017-02-08 MED ORDER — INSULIN REGULAR HUMAN 100 UNIT/ML IJ SOLN: 5.0000 [IU] | Freq: Three times a day (TID) | INTRAMUSCULAR | 11 refills | Status: DC

## 2017-02-08 MED ORDER — FERROUS SULFATE 325 (65 FE) MG PO TABS: 325.0000 mg | ORAL_TABLET | Freq: Every day | ORAL | 0 refills | Status: DC

## 2017-02-08 MED ORDER — INSULIN NPH (HUMAN) (ISOPHANE) 100 UNIT/ML ~~LOC~~ SUSP: 10.0000 [IU] | Freq: Two times a day (BID) | SUBCUTANEOUS | 11 refills | Status: AC

## 2017-02-08 NOTE — Progress Notes (Signed)
Patient refusing bed alarm. Educated on safety.  

## 2017-02-08 NOTE — Care Management Important Message (Signed)
Important Message  Patient Details  Name: Vanessa Oneill MRN: [id number] Date of Birth: 31-Mar-1950   Medicare Important Message Given:  Yes    Shelbie Ammons, RN 02/08/2017, 6:35 AM

## 2017-02-08 NOTE — Plan of Care (Signed)
  Progressing Health Behavior/Discharge Planning: Ability to manage health-related needs will improve 02/08/2017 0406 - Progressing by Denice Bors, RN Clinical Measurements: Ability to maintain clinical measurements within normal limits will improve 02/08/2017 0406 - Progressing by Denice Bors, RN Will remain free from infection 02/08/2017 0406 - Progressing by Denice Bors, RN Diagnostic test results will improve 02/08/2017 0406 - Progressing by Denice Bors, RN Respiratory complications will improve 02/08/2017 0406 - Progressing by Denice Bors, RN Cardiovascular complication will be avoided 02/08/2017 0406 - Progressing by Denice Bors, RN Activity: Risk for activity intolerance will decrease 02/08/2017 0406 - Progressing by Denice Bors, RN Elimination: Will not experience complications related to bowel motility 02/08/2017 0406 - Progressing by Denice Bors, RN Will not experience complications related to urinary retention 02/08/2017 0406 - Progressing by Denice Bors, RN Pain Managment: General experience of comfort will improve 02/08/2017 0406 - Progressing by Denice Bors, RN Safety: Ability to remain free from injury will improve 02/08/2017 0406 - Progressing by Denice Bors, RN Skin Integrity: Risk for impaired skin integrity will decrease 02/08/2017 0406 - Progressing by Denice Bors, RN

## 2017-02-08 NOTE — Discharge Summary (Signed)
[place] [facility] [place] NAME: Vanessa Oneill    MR#:  [id number]  DATE OF BIRTH:  30-Jan-1950  DATE OF ADMISSION:  02/05/2017 ADMITTING PHYSICIAN: Saundra Shelling, MD  DATE OF DISCHARGE: 02/08/2017 10:58 AM  PRIMARY CARE PHYSICIAN: Adin Hector, MD    ADMISSION DIAGNOSIS:  Pyelonephritis [N12]  DISCHARGE DIAGNOSIS:  Viral infection  SECONDARY DIAGNOSIS:   Past Medical History:  Diagnosis Date  . Acid reflux   . Anxiety   . Arthritis   . Cerebral aneurysm   . COPD (chronic obstructive pulmonary disease) ([place])   . Depression   . Diabetes mellitus without complication ([place])   . Fibrocystic breast disease   . Hyperlipidemia   . Hypertension   . Liver disease   . Mitral valve regurgitation   . 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[place])   . Personal history of tobacco use, presenting hazards to health 11/12/2014  . Raynaud's disease   . Sleep apnea     HOSPITAL COURSE:   1.  Fever.  Unclear etiology.  Could be viral in nature.  Patient was brought in with suspected urinary tract infection but our urine culture was negative.  Patient did receive a dose of tobramycin and meropenem but then antibiotics were stopped.  Patient's urine analysis showed only small leukocyte esterase and negative nitrites.  One blood culture and gram-positive cocci which was a skin contamination.  I offered the patient empiric antibiotics just in case this was a bacterial infection.  She refused antibiotics.  I offered a dose of fosfomycin here in the hospital prior to disposition which she also refused.  Patient's husband at home with nausea vomiting and diarrhea. 2.  Generalized weakness with body aches.  Likely viral in nature. 3.  Hyponatremia.  Improved with IV fluids.  Stop hydrochlorothiazide. 4.  Pancytopenia with history of liver disease.  Start ferrous sulfate. 5.  Diabetes mellitus.  Sugars here on the lower side probably because of the patient's poor appetite.   Stop Amaryl.  Lower dose of NPH insulin 10 units twice a day. 6.  Weakness.  Physical therapy recommended home health but the patient refused   DISCHARGE CONDITIONS:   Satisfactory  CONSULTS OBTAINED:  None  DRUG ALLERGIES:   Allergies  Allergen Reactions  . Codeine   . Lipitor [Atorvastatin]   . Lisinopril   . Penicillins   . Tylenol With Codeine #3 [Acetaminophen-Codeine]   . Amoxicillin Itching and Rash    DISCHARGE MEDICATIONS:   Allergies as of 02/08/2017      Reactions   Codeine    Lipitor [atorvastatin]    Lisinopril    Penicillins    Tylenol With Codeine #3 [acetaminophen-codeine]    Amoxicillin Itching, Rash      Medication List    STOP taking these medications   buPROPion 150 MG 24 hr tablet Commonly known as:  WELLBUTRIN XL   ertapenem 1 g in sodium chloride 0.9 % 50 mL   estradiol 0.1 MG/GM vaginal cream Commonly known as:  ESTRACE   glimepiride 4 MG tablet Commonly known as:  AMARYL   hydrochlorothiazide 25 MG tablet Commonly known as:  HYDRODIURIL   ketorolac 10 MG tablet Commonly known as:  TORADOL   metFORMIN 500 MG 24 hr tablet Commonly known as:  GLUCOPHAGE-XR   nitrofurantoin (macrocrystal-monohydrate) 100 MG capsule Commonly known as:  MACROBID     TAKE these medications   ALPRAZolam 0.25 MG tablet Commonly known as:  XANAX Take  0.25 mg by mouth at bedtime as needed for sleep.   conjugated estrogens vaginal cream Commonly known as:  PREMARIN Apply 0.5mg  (pea-sized amount)  just inside the vaginal introitus with a finger-tip on  Monday, Wednesday and Friday nights.   cyclobenzaprine 10 MG tablet Commonly known as:  FLEXERIL Take 10 mg by mouth 3 (three) times daily as needed for muscle spasms.   DULoxetine 30 MG capsule Commonly known as:  CYMBALTA Take 30 mg by mouth daily.   escitalopram 10 MG tablet Commonly known as:  LEXAPRO Take 10 mg by mouth daily.   feeding supplement (ENSURE ENLIVE) Liqd Take 237 mLs by  mouth 2 (two) times daily between meals.   ferrous sulfate 325 (65 FE) MG tablet Take 1 tablet (325 mg total) by mouth daily.   gabapentin 100 MG capsule Commonly known as:  NEURONTIN Take 100 mg by mouth 2 (two) times daily.   HYDROcodone-acetaminophen 5-325 MG tablet Commonly known as:  NORCO/VICODIN Take 1 tablet by mouth every 6 (six) hours as needed for moderate pain.   insulin NPH Human 100 UNIT/ML injection Commonly known as:  HUMULIN N,NOVOLIN N Inject 0.1 mLs (10 Units total) into the skin 2 (two) times daily before a meal. Use 30 units in the morning, 20 units during the day and 30 units at night. What changed:  how much to take   insulin regular 100 units/mL injection Commonly known as:  HUMULIN R Inject 0.05 mLs (5 Units total) into the skin 3 (three) times daily. What changed:  how much to take   losartan 100 MG tablet Commonly known as:  COZAAR Take 100 mg daily by mouth.   nadolol 40 MG tablet Commonly known as:  CORGARD Take 1 tablet (40 mg total) by mouth daily.   pantoprazole 40 MG tablet Commonly known as:  PROTONIX Take 40 mg by mouth 2 (two) times daily.   rOPINIRole 1 MG tablet Commonly known as:  REQUIP Take 1 mg by mouth at bedtime.   traZODone 50 MG tablet Commonly known as:  DESYREL Take 50 mg by mouth at bedtime as needed for sleep.        DISCHARGE INSTRUCTIONS:   Follow-up PMD 1 week  If you experience worsening of your admission symptoms, develop shortness of breath, life threatening emergency, suicidal or homicidal thoughts you must seek medical attention immediately by calling 911 or calling your MD immediately  if symptoms less severe.  You Must read complete instructions/literature along with all the possible adverse reactions/side effects for all the Medicines you take and that have been prescribed to you. Take any new Medicines after you have completely understood and accept all the possible adverse reactions/side effects.    Please note  You were cared for by a hospitalist during your hospital stay. If you have any questions about your discharge medications or the care you received while you were in the hospital after you are discharged, you can call the unit and asked to speak with the hospitalist on call if the hospitalist that took care of you is not available. Once you are discharged, your primary care physician will handle any further medical issues. Please note that NO REFILLS for any discharge medications will be authorized once you are discharged, as it is imperative that you return to your primary care physician (or establish a relationship with a primary care physician if you do not have one) for your aftercare needs so that they can reassess your need for medications  and monitor your lab values.    Today   CHIEF COMPLAINT:   Chief Complaint  Patient presents with  . Back Pain  . Fever    HISTORY OF PRESENT ILLNESS:  Vanessa Oneill  is a 67 y.o. female presented with fever and body aches   VITAL SIGNS:  Blood pressure (!) 142/63, pulse 83, temperature 98.8 F (37.1 C), temperature source Oral, resp. rate 18, height 5\' 7"  (1.702 m), weight 92.3 kg (203 lb 6.4 oz), SpO2 91 %.   PHYSICAL EXAMINATION:  GENERAL:  67 y.o.-year-old patient lying in the bed with no acute distress.  EYES: Pupils equal, round, reactive to light and accommodation. No scleral icterus. Extraocular muscles intact.  HEENT: Head atraumatic, normocephalic. Oropharynx and nasopharynx clear.  NECK:  Supple, no jugular venous distention. No thyroid enlargement, no tenderness.  LUNGS: Normal breath sounds bilaterally, no wheezing, rales,rhonchi or crepitation. No use of accessory muscles of respiration.  CARDIOVASCULAR: S1, S2 normal. No murmurs, rubs, or gallops.  ABDOMEN: Soft, non-tender, non-distended. Bowel sounds present. No organomegaly or mass.  EXTREMITIES: No pedal edema, cyanosis, or clubbing.  NEUROLOGIC: Cranial  nerves II through XII are intact. Muscle strength 5/5 in all extremities. Sensation intact. Gait not checked.  PSYCHIATRIC: The patient is alert and oriented x 3.  SKIN: No obvious rash, lesion, or ulcer.   DATA REVIEW:   CBC Recent Labs  Lab 02/08/17 0616  WBC 3.0*  HGB 8.2*  HCT 26.8*  PLT 70*    Chemistries  Recent Labs  Lab 02/05/17 2009  02/08/17 0616  NA 129*   < > 133*  K 3.9   < > 3.6  CL 99*   < > 103  CO2 22   < > 22  GLUCOSE 173*   < > 167*  BUN 16   < > 11  CREATININE 0.93   < > 0.78  CALCIUM 8.9   < > 8.5*  AST 30  --   --   ALT 17  --   --   ALKPHOS 88  --   --   BILITOT 0.8  --   --    < > = values in this interval not displayed.     Microbiology Results  Results for orders placed or performed during the hospital encounter of 02/05/17  Urine Culture     Status: Abnormal   Collection Time: 02/05/17  8:09 PM  Result Value Ref Range Status   Specimen Description   Final    URINE, RANDOM Performed at [facility], [address] [place], [place] [address]    Special Requests   Final    NONE Performed at [facility], [address] [place], [place] [address]    Culture (A)  Final    <10,000 COLONIES/mL INSIGNIFICANT GROWTH Performed at [facility], [place] [address], [place], [place] [address]    Report Status 02/07/2017 FINAL  Final  Blood culture (routine x 2)     Status: None (Preliminary result)   Collection Time: 02/05/17 11:39 PM  Result Value Ref Range Status   Specimen Description BLOOD RIGHT FOREARM  Final   Special Requests   Final    BOTTLES DRAWN AEROBIC AND ANAEROBIC Blood Culture adequate volume Performed at [facility], [address] [place], [place] [address]    Culture  Setup Time   Final    GRAM POSITIVE COCCI ANAEROBIC BOTTLE ONLY CRITICAL RESULT CALLED TO, READ BACK BY AND  VERIFIED WITHSim Boast AT 4403 02/07/17 SDR Performed at [facility], [place] [address],  [place], [place] [address]    Culture GRAM POSITIVE COCCI  Final   Report Status PENDING  Incomplete  Blood culture (routine x 2)     Status: None (Preliminary result)   Collection Time: 02/05/17 11:39 PM  Result Value Ref Range Status   Specimen Description BLOOD RIGHT [facility]  Final   Special Requests   Final    BOTTLES DRAWN AEROBIC AND ANAEROBIC Blood Culture adequate volume   Culture   Final    NO GROWTH 3 DAYS Performed at [facility], [address], [place], [place] [address]    Report Status PENDING  Incomplete  Blood Culture ID Panel (Reflexed)     Status: None   Collection Time: 02/05/17 11:39 PM  Result Value Ref Range Status   Enterococcus species NOT DETECTED NOT DETECTED Final   Listeria monocytogenes NOT DETECTED NOT DETECTED Final   Staphylococcus species NOT DETECTED NOT DETECTED Final   Staphylococcus aureus NOT DETECTED NOT DETECTED Final   Streptococcus species NOT DETECTED NOT DETECTED Final   Streptococcus agalactiae NOT DETECTED NOT DETECTED Final   Streptococcus pneumoniae NOT DETECTED NOT DETECTED Final   Streptococcus pyogenes NOT DETECTED NOT DETECTED Final   Acinetobacter baumannii NOT DETECTED NOT DETECTED Final   Enterobacteriaceae species NOT DETECTED NOT DETECTED Final   Enterobacter cloacae complex NOT DETECTED NOT DETECTED Final   Escherichia coli NOT DETECTED NOT DETECTED Final   Klebsiella oxytoca NOT DETECTED NOT DETECTED Final   Klebsiella pneumoniae NOT DETECTED NOT DETECTED Final   Proteus species NOT DETECTED NOT DETECTED Final   Serratia marcescens NOT DETECTED NOT DETECTED Final   Haemophilus influenzae NOT DETECTED NOT DETECTED Final   Neisseria meningitidis NOT DETECTED NOT DETECTED Final   Pseudomonas aeruginosa NOT DETECTED NOT DETECTED Final   Candida albicans NOT DETECTED NOT DETECTED Final   Candida glabrata NOT DETECTED NOT DETECTED Final   Candida krusei NOT DETECTED NOT DETECTED Final   Candida parapsilosis NOT DETECTED NOT  DETECTED Final   Candida tropicalis NOT DETECTED NOT DETECTED Final    Comment: Performed at [facility], [address] [place], [place] [address]      Management plans discussed with the patient, and she told me she is going home today.  CODE STATUS:  Code Status History    Date Active Date Inactive Code Status Order ID Comments User Context   02/06/2017 02:36 02/08/2017 14:06 Full Code [id number]  Saundra Shelling, MD Inpatient   10/18/2016 01:12 10/24/2016 20:41 Full Code [id number]  Demetrios Loll, MD Inpatient   07/22/2016 10:50 07/25/2016 20:42 Full Code [id number]  Bettey Costa, MD ED    Advance Directive Documentation     Most Recent Value  Type of Advance Directive  Healthcare Power of Attorney, Living will  Pre-existing out of facility DNR order (yellow form or pink MOST form)  No data  "MOST" Form in Place?  No data      TOTAL TIME TAKING CARE OF THIS PATIENT: 35 minutes.    Loletha Grayer M.D on 02/08/2017 at 5:34 PM  Between 7am to 6pm - Pager - [phone]  After 6pm go to [email or web address] - password [email or web address] [facility]  [phone]  CC: Primary care physician; Adin Hector, MD

## 2017-02-08 NOTE — Progress Notes (Signed)
Vanessa Oneill to be D/C'd Home per MD order. Patient given discharge teaching and paperwork regarding medications, diet, follow-up appointments and activity. Patient understanding verbalized. No questions or complaints at this time. Skin condition as charted. IV removed prior to leaving.  No further needs by Care Management/Social Work. Prescription e-prescribed by MD.   An After Visit Summary was printed and given to the patient.  Patient escorted via wheelchair by volunteer to ride home with friend.   [name]

## 2017-02-08 NOTE — Care Management (Signed)
Admitted to [facility] with the diagnosis of urinary tract infection. Lives with husband. Sees Dr. Caryl Comes as primary care physician.  Takes care of all basic and instrumental activities of daily living herself. Physical therapy evaluation completed. Recommending home with home health and physical therapy. Declining servies at this time. Discharge to home today per Dr, Leslye Peer.   Shelbie Ammons RN MSN CCM Care Management [phone]

## 2017-02-09 LAB — CULTURE, BLOOD (ROUTINE X 2): Special Requests: ADEQUATE

## 2017-02-10 LAB — CULTURE, BLOOD (ROUTINE X 2)
CULTURE: NO GROWTH
Special Requests: ADEQUATE

## 2017-02-20 ENCOUNTER — Telehealth: Payer: Self-pay | Admitting: *Deleted

## 2017-02-20 DIAGNOSIS — Z87891 Personal history of nicotine dependence: Secondary | ICD-10-CM

## 2017-02-20 DIAGNOSIS — Z122 Encounter for screening for malignant neoplasm of respiratory organs: Secondary | ICD-10-CM

## 2017-02-20 NOTE — Telephone Encounter (Signed)
Notified patient that annual lung cancer screening low dose CT scan is due currently or will be in near future. Confirmed that patient is within the age range of 55-77, and asymptomatic, (no signs or symptoms of lung cancer). Patient denies illness that would prevent curative treatment for lung cancer if found. Verified smoking history, (current, 62 pack year). The shared decision making visit was done 11/13/14. Patient is agreeable for CT scan being scheduled.

## 2017-02-28 ENCOUNTER — Ambulatory Visit
Admission: RE | Admit: 2017-02-28 | Discharge: 2017-02-28 | Disposition: A | Discharge status: Home / Self Care | Payer: Medicare Other | Source: Ambulatory Visit | Attending: Nurse Practitioner | Admitting: Nurse Practitioner

## 2017-02-28 DIAGNOSIS — J439 Emphysema, unspecified: Secondary | ICD-10-CM | POA: Diagnosis not present

## 2017-02-28 DIAGNOSIS — Z87891 Personal history of nicotine dependence: Secondary | ICD-10-CM

## 2017-02-28 DIAGNOSIS — Z122 Encounter for screening for malignant neoplasm of respiratory organs: Secondary | ICD-10-CM | POA: Insufficient documentation

## 2017-02-28 DIAGNOSIS — I700 Atherosclerosis of aorta: Secondary | ICD-10-CM | POA: Insufficient documentation

## 2017-03-05 ENCOUNTER — Encounter: Payer: Self-pay | Admitting: *Deleted

## 2017-08-10 ENCOUNTER — Other Ambulatory Visit: Payer: Self-pay

## 2017-08-10 ENCOUNTER — Emergency Department: Payer: Medicare Other

## 2017-08-10 ENCOUNTER — Encounter: Payer: Self-pay | Admitting: Emergency Medicine

## 2017-08-10 ENCOUNTER — Inpatient Hospital Stay
Admission: EM | Admit: 2017-08-10 | Discharge: 2017-08-14 | DRG: 872 | Disposition: A | Discharge status: Home / Self Care | Payer: Medicare Other | Attending: Internal Medicine | Admitting: Internal Medicine

## 2017-08-10 DIAGNOSIS — G8929 Other chronic pain: Secondary | ICD-10-CM | POA: Diagnosis not present

## 2017-08-10 DIAGNOSIS — G4730 Sleep apnea, unspecified: Secondary | ICD-10-CM | POA: Diagnosis present

## 2017-08-10 DIAGNOSIS — F329 Major depressive disorder, single episode, unspecified: Secondary | ICD-10-CM | POA: Diagnosis present

## 2017-08-10 DIAGNOSIS — Z880 Allergy status to penicillin: Secondary | ICD-10-CM

## 2017-08-10 DIAGNOSIS — I340 Nonrheumatic mitral (valve) insufficiency: Secondary | ICD-10-CM | POA: Diagnosis present

## 2017-08-10 DIAGNOSIS — Z885 Allergy status to narcotic agent status: Secondary | ICD-10-CM | POA: Diagnosis not present

## 2017-08-10 DIAGNOSIS — Z794 Long term (current) use of insulin: Secondary | ICD-10-CM

## 2017-08-10 DIAGNOSIS — Z1612 Extended spectrum beta lactamase (ESBL) resistance: Secondary | ICD-10-CM | POA: Diagnosis present

## 2017-08-10 DIAGNOSIS — Z888 Allergy status to other drugs, medicaments and biological substances status: Secondary | ICD-10-CM | POA: Diagnosis not present

## 2017-08-10 DIAGNOSIS — K7460 Unspecified cirrhosis of liver: Secondary | ICD-10-CM | POA: Diagnosis present

## 2017-08-10 DIAGNOSIS — I7300 Raynaud's syndrome without gangrene: Secondary | ICD-10-CM | POA: Diagnosis present

## 2017-08-10 DIAGNOSIS — R011 Cardiac murmur, unspecified: Secondary | ICD-10-CM | POA: Diagnosis not present

## 2017-08-10 DIAGNOSIS — B9620 Unspecified Escherichia coli [E. coli] as the cause of diseases classified elsewhere: Secondary | ICD-10-CM | POA: Diagnosis not present

## 2017-08-10 DIAGNOSIS — I8510 Secondary esophageal varices without bleeding: Secondary | ICD-10-CM | POA: Diagnosis not present

## 2017-08-10 DIAGNOSIS — E119 Type 2 diabetes mellitus without complications: Secondary | ICD-10-CM | POA: Diagnosis present

## 2017-08-10 DIAGNOSIS — E785 Hyperlipidemia, unspecified: Secondary | ICD-10-CM | POA: Diagnosis present

## 2017-08-10 DIAGNOSIS — K766 Portal hypertension: Secondary | ICD-10-CM | POA: Diagnosis present

## 2017-08-10 DIAGNOSIS — A4151 Sepsis due to Escherichia coli [E. coli]: Secondary | ICD-10-CM | POA: Diagnosis present

## 2017-08-10 DIAGNOSIS — N10 Acute tubulo-interstitial nephritis: Secondary | ICD-10-CM | POA: Diagnosis present

## 2017-08-10 DIAGNOSIS — F419 Anxiety disorder, unspecified: Secondary | ICD-10-CM | POA: Diagnosis present

## 2017-08-10 DIAGNOSIS — Z79899 Other long term (current) drug therapy: Secondary | ICD-10-CM | POA: Diagnosis not present

## 2017-08-10 DIAGNOSIS — F17210 Nicotine dependence, cigarettes, uncomplicated: Secondary | ICD-10-CM | POA: Diagnosis present

## 2017-08-10 DIAGNOSIS — I10 Essential (primary) hypertension: Secondary | ICD-10-CM | POA: Diagnosis present

## 2017-08-10 DIAGNOSIS — Z886 Allergy status to analgesic agent status: Secondary | ICD-10-CM

## 2017-08-10 DIAGNOSIS — I959 Hypotension, unspecified: Secondary | ICD-10-CM | POA: Diagnosis present

## 2017-08-10 DIAGNOSIS — Z881 Allergy status to other antibiotic agents status: Secondary | ICD-10-CM

## 2017-08-10 DIAGNOSIS — K219 Gastro-esophageal reflux disease without esophagitis: Secondary | ICD-10-CM | POA: Diagnosis present

## 2017-08-10 DIAGNOSIS — L0291 Cutaneous abscess, unspecified: Secondary | ICD-10-CM

## 2017-08-10 DIAGNOSIS — J449 Chronic obstructive pulmonary disease, unspecified: Secondary | ICD-10-CM | POA: Diagnosis present

## 2017-08-10 DIAGNOSIS — N390 Urinary tract infection, site not specified: Secondary | ICD-10-CM | POA: Diagnosis not present

## 2017-08-10 DIAGNOSIS — M549 Dorsalgia, unspecified: Secondary | ICD-10-CM | POA: Diagnosis not present

## 2017-08-10 DIAGNOSIS — N3941 Urge incontinence: Secondary | ICD-10-CM | POA: Diagnosis not present

## 2017-08-10 DIAGNOSIS — N12 Tubulo-interstitial nephritis, not specified as acute or chronic: Secondary | ICD-10-CM | POA: Diagnosis present

## 2017-08-10 DIAGNOSIS — G2581 Restless legs syndrome: Secondary | ICD-10-CM | POA: Diagnosis present

## 2017-08-10 DIAGNOSIS — D6959 Other secondary thrombocytopenia: Secondary | ICD-10-CM | POA: Diagnosis present

## 2017-08-10 DIAGNOSIS — Z79890 Hormone replacement therapy (postmenopausal): Secondary | ICD-10-CM | POA: Diagnosis not present

## 2017-08-10 LAB — CBC WITH DIFFERENTIAL/PLATELET
Basophils Absolute: 0 10*3/uL (ref 0–0.1)
Basophils Relative: 0 %
EOS PCT: 1 %
Eosinophils Absolute: 0 10*3/uL (ref 0–0.7)
HCT: 31 % — ABNORMAL LOW (ref 35.0–47.0)
Hemoglobin: 10.1 g/dL — ABNORMAL LOW (ref 12.0–16.0)
LYMPHS ABS: 0.4 10*3/uL — AB (ref 1.0–3.6)
Lymphocytes Relative: 6 %
MCH: 24.8 pg — AB (ref 26.0–34.0)
MCHC: 32.6 g/dL (ref 32.0–36.0)
MCV: 76 fL — ABNORMAL LOW (ref 80.0–100.0)
MONO ABS: 0.6 10*3/uL (ref 0.2–0.9)
MONOS PCT: 7 %
Neutro Abs: 7 10*3/uL — ABNORMAL HIGH (ref 1.4–6.5)
Neutrophils Relative %: 86 %
Platelets: 99 10*3/uL — ABNORMAL LOW (ref 150–440)
RBC: 4.09 MIL/uL (ref 3.80–5.20)
RDW: 18 % — AB (ref 11.5–14.5)
WBC: 8 10*3/uL (ref 3.6–11.0)

## 2017-08-10 LAB — URINALYSIS, COMPLETE (UACMP) WITH MICROSCOPIC
Bilirubin Urine: NEGATIVE
GLUCOSE, UA: NEGATIVE mg/dL
Ketones, ur: NEGATIVE mg/dL
Nitrite: NEGATIVE
PROTEIN: NEGATIVE mg/dL
SPECIFIC GRAVITY, URINE: 1.006 (ref 1.005–1.030)
pH: 5 (ref 5.0–8.0)

## 2017-08-10 LAB — COMPREHENSIVE METABOLIC PANEL
ALK PHOS: 94 U/L (ref 38–126)
ALT: 20 U/L (ref 0–44)
AST: 38 U/L (ref 15–41)
Albumin: 3.8 g/dL (ref 3.5–5.0)
Anion gap: 9 (ref 5–15)
BUN: 20 mg/dL (ref 8–23)
CHLORIDE: 107 mmol/L (ref 98–111)
CO2: 21 mmol/L — ABNORMAL LOW (ref 22–32)
CREATININE: 1.03 mg/dL — AB (ref 0.44–1.00)
Calcium: 9 mg/dL (ref 8.9–10.3)
GFR calc non Af Amer: 55 mL/min — ABNORMAL LOW (ref >60)
GLUCOSE: 111 mg/dL — AB (ref 70–99)
Potassium: 4.3 mmol/L (ref 3.5–5.1)
SODIUM: 137 mmol/L (ref 135–145)
Total Bilirubin: 1.1 mg/dL (ref 0.3–1.2)
Total Protein: 7.1 g/dL (ref 6.5–8.1)

## 2017-08-10 LAB — LACTIC ACID, PLASMA
Lactic Acid, Venous: 2 mmol/L (ref 0.5–1.9)
Lactic Acid, Venous: 1.6 mmol/L (ref 0.5–1.9)

## 2017-08-10 LAB — AMMONIA: AMMONIA: 35 umol/L (ref 9–35)

## 2017-08-10 MED ORDER — SODIUM CHLORIDE 0.9 % IV SOLN
1.0000 g | Freq: Once | INTRAVENOUS | Status: AC
  Administered 2017-08-10: 1 g via INTRAVENOUS
  Filled 2017-08-10: qty 10

## 2017-08-10 MED ORDER — FENTANYL CITRATE (PF) 100 MCG/2ML IJ SOLN
50.0000 ug | Freq: Once | INTRAMUSCULAR | Status: AC
Start: 2017-08-10 — End: 2017-08-10
  Administered 2017-08-10: 50 ug via INTRAVENOUS
  Filled 2017-08-10: qty 2

## 2017-08-10 MED ORDER — SODIUM CHLORIDE 0.9 % IV BOLUS
1000.0000 mL | Freq: Once | INTRAVENOUS | Status: AC
  Administered 2017-08-10: 1000 mL via INTRAVENOUS

## 2017-08-10 MED ORDER — FENTANYL CITRATE (PF) 100 MCG/2ML IJ SOLN
75.0000 ug | Freq: Once | INTRAMUSCULAR | Status: AC
  Administered 2017-08-10: 75 ug via INTRAVENOUS
  Filled 2017-08-10: qty 2

## 2017-08-10 NOTE — ED Triage Notes (Addendum)
C/O back pain, headache, neck pain x 1 day.  Patient states she thinks she has a UTI.  Also running a fever today.  Ibuprofen given for temp of 102 at 1500.

## 2017-08-10 NOTE — ED Provider Notes (Addendum)
[facility] Emergency Department Provider Note  ____________________________________________   I have reviewed the triage vital signs and the nursing notes.   HISTORY  Chief Complaint Back Pain and Fever   History limited by: Not Limited   HPI Vanessa Oneill is a 67 y.o. female who presents to the emergency department today because of concern for left sided flank pain. Pain started yesterday. She states it has gotten worse. She has noticed increased frequency to urination. She has had UTIs in the past. Family states that given her history of cirrhosis she has gotten septic in the past from UTI.    Per medical record review patient has a history of COPD, depression.  Past Medical History:  Diagnosis Date  . Acid reflux   . Anxiety   . Arthritis   . Cerebral aneurysm   . COPD (chronic obstructive pulmonary disease) ([place])   . Depression   . Diabetes mellitus without complication ([place])   . Fibrocystic breast disease   . Hyperlipidemia   . Hypertension   . Liver disease   . Mitral valve regurgitation   . 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[place])   . Personal history of tobacco use, presenting hazards to health 11/12/2014  . Raynaud's disease   . Sleep apnea     Patient Active Problem List   Diagnosis Date Noted  . UTI (urinary tract infection) 02/06/2017  . Fever 02/06/2017  . Septic shock ([place]) 10/18/2016  . Urinary tract infection without hematuria   . Respiratory distress   . Sepsis ([place]) 10/17/2016  . GIB (gastrointestinal bleeding) 07/22/2016  . Personal history of tobacco use, presenting hazards to health 11/12/2014    Past Surgical History:  Procedure Laterality Date  . ABDOMINAL HYSTERECTOMY    . APPENDECTOMY    . BREAST BIOPSY Right    neg- core  . CHOLECYSTECTOMY    . ESOPHAGOGASTRODUODENOSCOPY (EGD) WITH PROPOFOL N/A 07/24/2016   Procedure: ESOPHAGOGASTRODUODENOSCOPY (EGD) WITH PROPOFOL;  Surgeon: Jonathon Bellows, MD;  Location: [facility]  ENDOSCOPY;  Service: Endoscopy;  Laterality: N/A;  . ESOPHAGOGASTRODUODENOSCOPY (EGD) WITH PROPOFOL N/A 11/21/2016   Procedure: ESOPHAGOGASTRODUODENOSCOPY (EGD) WITH PROPOFOL;  Surgeon: Toledo, Benay Pike, MD;  Location: [facility] ENDOSCOPY;  Service: Gastroenterology;  Laterality: N/A;  . TONSILLECTOMY      Prior to Admission medications   Medication Sig Start Date End Date Taking? Authorizing Provider  ALPRAZolam Duanne Moron) 0.25 MG tablet Take 0.25 mg by mouth at bedtime as needed for sleep. 07/06/16   [provider]  conjugated estrogens (PREMARIN) vaginal cream Apply 0.5mg  (pea-sized amount)  just inside the vaginal introitus with a finger-tip on  Monday, Wednesday and Friday nights. Patient not taking: Reported on 02/06/2017 12/14/16   Zara Council A, PA-C  cyclobenzaprine (FLEXERIL) 10 MG tablet Take 10 mg by mouth 3 (three) times daily as needed for muscle spasms. 09/20/15   [provider]  DULoxetine (CYMBALTA) 30 MG capsule Take 30 mg by mouth daily.    [provider]  escitalopram (LEXAPRO) 10 MG tablet Take 10 mg by mouth daily.    [provider]  feeding supplement, ENSURE ENLIVE, (ENSURE ENLIVE) LIQD Take 237 mLs by mouth 2 (two) times daily between meals. 10/24/16   Gladstone Lighter, MD  ferrous sulfate 325 (65 FE) MG tablet Take 1 tablet (325 mg total) by mouth daily. 02/08/17 02/08/18  Loletha Grayer, MD  gabapentin (NEURONTIN) 100 MG capsule Take 100 mg by mouth 2 (two) times daily.    [provider]  HYDROcodone-acetaminophen (  NORCO/VICODIN) 5-325 MG tablet Take 1 tablet by mouth every 6 (six) hours as needed for moderate pain.    [provider]  insulin NPH Human (HUMULIN N,NOVOLIN N) 100 UNIT/ML injection Inject 0.1 mLs (10 Units total) into the skin 2 (two) times daily before a meal. Use 30 units in the morning, 20 units during the day and 30 units at night. 02/08/17   Loletha Grayer, MD  insulin regular (HUMULIN R) 100  units/mL injection Inject 0.05 mLs (5 Units total) into the skin 3 (three) times daily. 02/08/17   Loletha Grayer, MD  losartan (COZAAR) 100 MG tablet Take 100 mg daily by mouth.    [provider]  nadolol (CORGARD) 40 MG tablet Take 1 tablet (40 mg total) by mouth daily. 10/24/16   Gladstone Lighter, MD  pantoprazole (PROTONIX) 40 MG tablet Take 40 mg by mouth 2 (two) times daily.     [provider]  rOPINIRole (REQUIP) 1 MG tablet Take 1 mg by mouth at bedtime.     [provider]  traZODone (DESYREL) 50 MG tablet Take 50 mg by mouth at bedtime as needed for sleep. 06/19/16   [provider]    Allergies Codeine; Lipitor [atorvastatin]; Lisinopril; Penicillins; Tylenol with codeine #3 [acetaminophen-codeine]; and Amoxicillin  Family History  Problem Relation Age of Onset  . Lung cancer Father   . Heart Problems Father   . Heart failure Mother   . Breast cancer Neg Hx     Social History Social History   Tobacco Use  . Smoking status: Current Every Day Smoker    Packs/day: 1.00    Years: 48.00    Pack years: 48.00    Types: Cigarettes  . Smokeless tobacco: Never Used  . Tobacco comment: patient refused  Substance Use Topics  . Alcohol use: No    Alcohol/week: 0.0 standard drinks  . Drug use: No    Review of Systems Constitutional: No fever/chills Eyes: No visual changes. ENT: No sore throat. Cardiovascular: Denies chest pain. Respiratory: Denies shortness of breath. Gastrointestinal: Positive for left flank pain. Genitourinary: Positive for increased urination. Musculoskeletal: Negative for back pain. Skin: Negative for rash. Neurological: Negative for headaches, focal weakness or numbness.  ____________________________________________   PHYSICAL EXAM:  VITAL SIGNS: ED Triage Vitals  Enc Vitals Group     BP 08/10/17 1636 (!) 107/56     Pulse Rate 08/10/17 1636 81     Resp 08/10/17 1636 16     Temp 08/10/17 1636 99.2 F  (37.3 C)     Temp Source 08/10/17 1636 Oral     SpO2 08/10/17 1636 94 %     Weight 08/10/17 1634 195 lb (88.5 kg)     Height 08/10/17 1634 5\' 7"  (1.702 m)     Head Circumference --      Peak Flow --      Pain Score 08/10/17 1634 7   Constitutional: Alert and oriented.  Eyes: Conjunctivae are normal.  ENT      Head: Normocephalic and atraumatic.      Nose: No congestion/rhinnorhea.      Mouth/Throat: Mucous membranes are moist.      Neck: No stridor. Hematological/Lymphatic/Immunilogical: No cervical lymphadenopathy. Cardiovascular: Normal rate, regular rhythm.  No murmurs, rubs, or gallops.  Respiratory: Normal respiratory effort without tachypnea nor retractions. Breath sounds are clear and equal bilaterally. No wheezes/rales/rhonchi. Gastrointestinal: Soft and non tender. No rebound. No guarding.  Genitourinary: Deferred Musculoskeletal: Normal range of motion in all  extremities. No lower extremity edema. Neurologic:  Normal speech and language. No gross focal neurologic deficits are appreciated.  Skin:  Skin is warm, dry and intact. No rash noted. Psychiatric: Mood and affect are normal. Speech and behavior are normal. Patient exhibits appropriate insight and judgment.  ____________________________________________    LABS (pertinent positives/negatives)  Lactic 2.0 CMP na 137, k 4.3, glu 111, cr 1.03 CBC wbc 8.0, hgb 10.1, plt 99 WBC clear, small leukocytes, wbc 21-50, many bacteria ____________________________________________   EKG  I, Nance Pear, attending physician, personally viewed and interpreted this EKG  EKG Time: 2018 Rate: 81 Rhythm: normal sinus rhythm Axis: normal Intervals: qtc 426 QRS: narrow, q waves III ST changes: no st elevation Impression: abnormal ekg  ____________________________________________    RADIOLOGY  CXR No acute  abnormality  ____________________________________________   PROCEDURES  Procedures  ____________________________________________   INITIAL IMPRESSION / ASSESSMENT AND PLAN / ED COURSE  Pertinent labs & imaging results that were available during my care of the patient were reviewed by me and considered in my medical decision making (see chart for details).   Patient presented to the emergency department today because of concerns for left flank pain.  Patient has history of sepsis secondary UTI.  Patient's urine is consistent with infection.  Given flank pain do have concern for pyelonephritis.  Initial lactic was elevated at 2.0.  Will plan on IV antibiotics and admission.  Discussed findings plan with patient  ____________________________________________   FINAL CLINICAL IMPRESSION(S) / ED DIAGNOSES  Final diagnoses:  Pyelonephritis     Note: This dictation was prepared with Dragon dictation. Any transcriptional errors that result from this process are unintentional     Nance Pear, MD 08/10/17 2127    Nance Pear, MD 08/10/17 2128

## 2017-08-10 NOTE — ED Notes (Signed)
Pt ambulatory to BR with standby assist. 

## 2017-08-10 NOTE — H&P (Signed)
Vanessa Oneill NAME: Vanessa Oneill    MR#:  [id number]  DATE OF BIRTH:  17-Jul-1950  DATE OF ADMISSION:  08/10/2017  PRIMARY CARE PHYSICIAN: Adin Hector, MD   REQUESTING/REFERRING PHYSICIAN: Archie Balboa, MD  CHIEF COMPLAINT:   Chief Complaint  Patient presents with  . Back Pain  . Fever    HISTORY OF PRESENT ILLNESS:  Vanessa Oneill  is a 67 y.o. female who presents with chief complaint as above.  She states her back pain started within the last day or 2.  She is also had some dysuria.  Here in the ED she is found of UTI, with clinical symptoms of pyelonephritis.  Other work-up is largely within normal limits and the patient did not meet sepsis criteria.  However, given her significant discomfort hospitalist were called for admission.  PAST MEDICAL HISTORY:   Past Medical History:  Diagnosis Date  . Acid reflux   . Anxiety   . Arthritis   . Cerebral aneurysm   . COPD (chronic obstructive pulmonary disease) ([place])   . Depression   . Diabetes mellitus without complication ([place])   . Fibrocystic breast disease   . Hyperlipidemia   . Hypertension   . Liver disease   . Mitral valve regurgitation   . 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[place])   . Personal history of tobacco use, presenting hazards to health 11/12/2014  . Raynaud's disease   . Sleep apnea      PAST SURGICAL HISTORY:   Past Surgical History:  Procedure Laterality Date  . ABDOMINAL HYSTERECTOMY    . APPENDECTOMY    . BREAST BIOPSY Right    neg- core  . CHOLECYSTECTOMY    . ESOPHAGOGASTRODUODENOSCOPY (EGD) WITH PROPOFOL N/A 07/24/2016   Procedure: ESOPHAGOGASTRODUODENOSCOPY (EGD) WITH PROPOFOL;  Surgeon: Jonathon Bellows, MD;  Location: [facility] ENDOSCOPY;  Service: Endoscopy;  Laterality: N/A;  . ESOPHAGOGASTRODUODENOSCOPY (EGD) WITH PROPOFOL N/A 11/21/2016   Procedure: ESOPHAGOGASTRODUODENOSCOPY (EGD) WITH PROPOFOL;  Surgeon: Toledo, Benay Pike, MD;  Location: [facility]  ENDOSCOPY;  Service: Gastroenterology;  Laterality: N/A;  . TONSILLECTOMY       SOCIAL HISTORY:   Social History   Tobacco Use  . Smoking status: Current Every Day Smoker    Packs/day: 1.00    Years: 48.00    Pack years: 48.00    Types: Cigarettes  . Smokeless tobacco: Never Used  . Tobacco comment: patient refused  Substance Use Topics  . Alcohol use: No    Alcohol/week: 0.0 standard drinks     FAMILY HISTORY:   Family History  Problem Relation Age of Onset  . Lung cancer Father   . Heart Problems Father   . Heart failure Mother   . Breast cancer Neg Hx      DRUG ALLERGIES:   Allergies  Allergen Reactions  . Codeine   . Lipitor [Atorvastatin]   . Lisinopril   . Penicillins   . Tylenol With Codeine #3 [Acetaminophen-Codeine]   . Amoxicillin Itching and Rash    MEDICATIONS AT HOME:   Prior to Admission medications   Medication Sig Start Date End Date Taking? Authorizing Provider  ALPRAZolam Duanne Moron) 0.25 MG tablet Take 0.25 mg by mouth at bedtime as needed for sleep. 07/06/16   [provider]  conjugated estrogens (PREMARIN) vaginal cream Apply 0.5mg  (pea-sized amount)  just inside the vaginal introitus with a finger-tip on  Monday, Wednesday and Friday nights. Patient not taking: Reported on 02/06/2017 12/14/16  Zara Council A, PA-C  cyclobenzaprine (FLEXERIL) 10 MG tablet Take 10 mg by mouth 3 (three) times daily as needed for muscle spasms. 09/20/15   [provider]  DULoxetine (CYMBALTA) 30 MG capsule Take 30 mg by mouth daily.    [provider]  escitalopram (LEXAPRO) 10 MG tablet Take 10 mg by mouth daily.    [provider]  feeding supplement, ENSURE ENLIVE, (ENSURE ENLIVE) LIQD Take 237 mLs by mouth 2 (two) times daily between meals. 10/24/16   Gladstone Lighter, MD  ferrous sulfate 325 (65 FE) MG tablet Take 1 tablet (325 mg total) by mouth daily. 02/08/17 02/08/18  Loletha Grayer, MD  gabapentin (NEURONTIN) 100  MG capsule Take 100 mg by mouth 2 (two) times daily.    [provider]  HYDROcodone-acetaminophen (NORCO/VICODIN) 5-325 MG tablet Take 1 tablet by mouth every 6 (six) hours as needed for moderate pain.    [provider]  insulin NPH Human (HUMULIN N,NOVOLIN N) 100 UNIT/ML injection Inject 0.1 mLs (10 Units total) into the skin 2 (two) times daily before a meal. Use 30 units in the morning, 20 units during the day and 30 units at night. 02/08/17   Loletha Grayer, MD  insulin regular (HUMULIN R) 100 units/mL injection Inject 0.05 mLs (5 Units total) into the skin 3 (three) times daily. 02/08/17   Loletha Grayer, MD  losartan (COZAAR) 100 MG tablet Take 100 mg daily by mouth.    [provider]  nadolol (CORGARD) 40 MG tablet Take 1 tablet (40 mg total) by mouth daily. 10/24/16   Gladstone Lighter, MD  pantoprazole (PROTONIX) 40 MG tablet Take 40 mg by mouth 2 (two) times daily.     [provider]  rOPINIRole (REQUIP) 1 MG tablet Take 1 mg by mouth at bedtime.     [provider]  traZODone (DESYREL) 50 MG tablet Take 50 mg by mouth at bedtime as needed for sleep. 06/19/16   [provider]    REVIEW OF SYSTEMS:  Review of Systems  Constitutional: Positive for fever. Negative for chills, malaise/fatigue and weight loss.  HENT: Negative for ear pain, hearing loss and tinnitus.   Eyes: Negative for blurred vision, double vision, pain and redness.  Respiratory: Negative for cough, hemoptysis and shortness of breath.   Cardiovascular: Negative for chest pain, palpitations, orthopnea and leg swelling.  Gastrointestinal: Negative for abdominal pain, constipation, diarrhea, nausea and vomiting.  Genitourinary: Positive for dysuria and flank pain. Negative for frequency and hematuria.  Musculoskeletal: Negative for back pain, joint pain and neck pain.  Skin:       No acne, rash, or lesions  Neurological: Negative for dizziness, tremors, focal  weakness and weakness.  Endo/Heme/Allergies: Negative for polydipsia. Does not bruise/bleed easily.  Psychiatric/Behavioral: Negative for depression. The patient is not nervous/anxious and does not have insomnia.      VITAL SIGNS:   Vitals:   08/10/17 1634 08/10/17 1636 08/10/17 2030  BP:  (!) 107/56 (!) 108/49  Pulse:  81 81  Resp:  16 20  Temp:  99.2 F (37.3 C)   TempSrc:  Oral   SpO2:  94% 93%  Weight: 88.5 kg    Height: 5\' 7"  (1.702 m)     Wt Readings from Last 3 Encounters:  08/10/17 88.5 kg  02/28/17 84.4 kg  02/06/17 92.3 kg    PHYSICAL EXAMINATION:  Physical Exam  Vitals reviewed. Constitutional: She is oriented to person, place, and time. She appears well-developed  and well-nourished. No distress.  HENT:  Head: Normocephalic and atraumatic.  Mouth/Throat: Oropharynx is clear and moist.  Eyes: Pupils are equal, round, and reactive to light. Conjunctivae and EOM are normal. No scleral icterus.  Neck: Normal range of motion. Neck supple. No JVD present. No thyromegaly present.  Cardiovascular: Normal rate, regular rhythm and intact distal pulses. Exam reveals no gallop and no friction rub.  No murmur heard. Respiratory: Effort normal and breath sounds normal. No respiratory distress. She has no wheezes. She has no rales.  GI: Soft. Bowel sounds are normal. She exhibits no distension. There is tenderness (Lateral abdomen and flank).  Musculoskeletal: Normal range of motion. She exhibits no edema.  No arthritis, no gout  Lymphadenopathy:    She has no cervical adenopathy.  Neurological: She is alert and oriented to person, place, and time. No cranial nerve deficit.  No dysarthria, no aphasia  Skin: Skin is warm and dry. No rash noted. No erythema.  Psychiatric: She has a normal mood and affect. Her behavior is normal. Judgment and thought content normal.    LABORATORY PANEL:   CBC Recent Labs  Lab 08/10/17 1643  WBC 8.0  HGB 10.1*  HCT 31.0*  PLT 99*    ------------------------------------------------------------------------------------------------------------------  Chemistries  Recent Labs  Lab 08/10/17 1643  NA 137  K 4.3  CL 107  CO2 21*  GLUCOSE 111*  BUN 20  CREATININE 1.03*  CALCIUM 9.0  AST 38  ALT 20  ALKPHOS 94  BILITOT 1.1   ------------------------------------------------------------------------------------------------------------------  Cardiac Enzymes No results for input(s): TROPONINI in the last 168 hours. ------------------------------------------------------------------------------------------------------------------  RADIOLOGY:  Dg Chest 2 View  Result Date: 08/10/2017 CLINICAL DATA:  Fevers EXAM: CHEST - 2 VIEW COMPARISON:  None. FINDINGS: Cardiac shadow is within normal limits. The lungs are well aerated with some chronic interstitial changes. No focal infiltrate or sizable effusion is noted. No bony abnormality is seen. IMPRESSION: Chronic changes without acute abnormality. Electronically Signed   By: Inez Catalina M.D.   On: 08/10/2017 17:14    EKG:   Orders placed or performed during the hospital encounter of 08/10/17  . ED EKG  . ED EKG    IMPRESSION AND PLAN:  Principal Problem:   Pyelonephritis -IV antibiotics initiated, PRN analgesia for discomfort Active Problems:   HTN (hypertension) -continue home dose antihypertensives   Diabetes (HCC) -sliding scale insulin with corresponding glucose checks   COPD (chronic obstructive pulmonary disease) (HCC) -home dose inhalers   HLD (hyperlipidemia) -Home dose antilipid   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[place]) -avoid hepatotoxins   RLS -continue home meds  Chart review performed and case discussed with ED provider. Labs, imaging and/or ECG reviewed by provider and discussed with patient/family. Management plans discussed with the patient and/or family.  DVT PROPHYLAXIS: SubQ lovenox   GI PROPHYLAXIS:  PPI   ADMISSION STATUS: Inpatient     CODE  STATUS: Full Code Status History    Date Active Date Inactive Code Status Order ID Comments User Context   02/06/2017 0236 02/08/2017 1406 Full Code [id number]  Saundra Shelling, MD Inpatient   10/18/2016 0112 10/24/2016 2041 Full Code [id number]  Demetrios Loll, MD Inpatient   07/22/2016 1050 07/25/2016 2042 Full Code [id number]  Bettey Costa, MD ED      TOTAL TIME TAKING CARE OF THIS PATIENT: 45 minutes.   Jannifer Franklin, Leahanna Buser FIELDING 08/10/2017, 10:23 PM  [facility]  Office  [phone]  CC: Primary care physician; Adin Hector, MD  Note:  This  document was prepared using Systems analyst and may include unintentional dictation errors.

## 2017-08-11 ENCOUNTER — Inpatient Hospital Stay: Payer: Medicare Other

## 2017-08-11 ENCOUNTER — Other Ambulatory Visit: Payer: Self-pay

## 2017-08-11 LAB — BASIC METABOLIC PANEL
Anion gap: 8 (ref 5–15)
BUN: 17 mg/dL (ref 8–23)
CALCIUM: 8.4 mg/dL — AB (ref 8.9–10.3)
CO2: 21 mmol/L — ABNORMAL LOW (ref 22–32)
Chloride: 106 mmol/L (ref 98–111)
Creatinine, Ser: 0.99 mg/dL (ref 0.44–1.00)
GFR calc Af Amer: >60 mL/min (ref >60)
GFR, EST NON AFRICAN AMERICAN: 58 mL/min — AB (ref >60)
GLUCOSE: 154 mg/dL — AB (ref 70–99)
POTASSIUM: 3.9 mmol/L (ref 3.5–5.1)
Sodium: 135 mmol/L (ref 135–145)

## 2017-08-11 LAB — GLUCOSE, CAPILLARY
Glucose-Capillary: 132 mg/dL — ABNORMAL HIGH (ref 70–99)
Glucose-Capillary: 142 mg/dL — ABNORMAL HIGH (ref 70–99)
Glucose-Capillary: 238 mg/dL — ABNORMAL HIGH (ref 70–99)
Glucose-Capillary: 258 mg/dL — ABNORMAL HIGH (ref 70–99)
Glucose-Capillary: 267 mg/dL — ABNORMAL HIGH (ref 70–99)

## 2017-08-11 LAB — CBC
HEMATOCRIT: 27.8 % — AB (ref 35.0–47.0)
Hemoglobin: 9.1 g/dL — ABNORMAL LOW (ref 12.0–16.0)
MCH: 25 pg — ABNORMAL LOW (ref 26.0–34.0)
MCHC: 32.9 g/dL (ref 32.0–36.0)
MCV: 76 fL — ABNORMAL LOW (ref 80.0–100.0)
Platelets: 81 10*3/uL — ABNORMAL LOW (ref 150–440)
RBC: 3.66 MIL/uL — ABNORMAL LOW (ref 3.80–5.20)
RDW: 18.4 % — AB (ref 11.5–14.5)
WBC: 8.3 10*3/uL (ref 3.6–11.0)

## 2017-08-11 MED ORDER — INSULIN ASPART 100 UNIT/ML ~~LOC~~ SOLN
0.0000 [IU] | Freq: Three times a day (TID) | SUBCUTANEOUS | Status: DC
  Administered 2017-08-11: 3 [IU] via SUBCUTANEOUS
  Administered 2017-08-11: 5 [IU] via SUBCUTANEOUS
  Administered 2017-08-11: 1 [IU] via SUBCUTANEOUS
  Administered 2017-08-12: 2 [IU] via SUBCUTANEOUS
  Administered 2017-08-12: 3 [IU] via SUBCUTANEOUS
  Administered 2017-08-12: 5 [IU] via SUBCUTANEOUS
  Administered 2017-08-13: 1 [IU] via SUBCUTANEOUS
  Administered 2017-08-13: 2 [IU] via SUBCUTANEOUS
  Administered 2017-08-13 – 2017-08-14 (×2): 3 [IU] via SUBCUTANEOUS
  Filled 2017-08-11 (×10): qty 1

## 2017-08-11 MED ORDER — OXYCODONE HCL 5 MG PO TABS
5.0000 mg | ORAL_TABLET | ORAL | Status: DC | PRN
  Administered 2017-08-11 – 2017-08-14 (×11): 5 mg via ORAL
  Filled 2017-08-11 (×11): qty 1

## 2017-08-11 MED ORDER — SODIUM CHLORIDE 0.9 % IV SOLN
1.0000 g | INTRAVENOUS | Status: DC
  Administered 2017-08-11 – 2017-08-12 (×2): 1 g via INTRAVENOUS
  Filled 2017-08-11: qty 1
  Filled 2017-08-11: qty 10
  Filled 2017-08-11: qty 1

## 2017-08-11 MED ORDER — ALPRAZOLAM 0.25 MG PO TABS: 0.2500 mg | ORAL_TABLET | Freq: Every evening | ORAL | Status: DC | PRN

## 2017-08-11 MED ORDER — ONDANSETRON HCL 4 MG/2ML IJ SOLN
4.0000 mg | Freq: Four times a day (QID) | INTRAMUSCULAR | Status: DC | PRN
  Administered 2017-08-13: 4 mg via INTRAVENOUS
  Filled 2017-08-11: qty 2

## 2017-08-11 MED ORDER — TRAZODONE HCL 50 MG PO TABS
50.0000 mg | ORAL_TABLET | Freq: Every evening | ORAL | Status: DC | PRN
Start: 2017-08-11 — End: 2017-08-14
  Administered 2017-08-11 – 2017-08-13 (×3): 50 mg via ORAL
  Filled 2017-08-11 (×3): qty 1

## 2017-08-11 MED ORDER — IBUPROFEN 400 MG PO TABS
400.0000 mg | ORAL_TABLET | Freq: Four times a day (QID) | ORAL | Status: DC | PRN
Start: 2017-08-11 — End: 2017-08-14
  Administered 2017-08-11 – 2017-08-14 (×6): 400 mg via ORAL
  Filled 2017-08-11 (×6): qty 1

## 2017-08-11 MED ORDER — SODIUM CHLORIDE 0.9 % IV SOLN
INTRAVENOUS | Status: DC
  Administered 2017-08-11 – 2017-08-13 (×6): via INTRAVENOUS

## 2017-08-11 MED ORDER — NADOLOL 40 MG PO TABS
40.0000 mg | ORAL_TABLET | Freq: Every day | ORAL | Status: DC
  Administered 2017-08-13: 40 mg via ORAL
  Filled 2017-08-11 (×4): qty 1

## 2017-08-11 MED ORDER — INSULIN ASPART 100 UNIT/ML ~~LOC~~ SOLN
0.0000 [IU] | Freq: Every day | SUBCUTANEOUS | Status: DC
  Administered 2017-08-11: 3 [IU] via SUBCUTANEOUS
  Filled 2017-08-11: qty 1

## 2017-08-11 MED ORDER — PANTOPRAZOLE SODIUM 40 MG PO TBEC
40.0000 mg | DELAYED_RELEASE_TABLET | Freq: Two times a day (BID) | ORAL | Status: DC
  Administered 2017-08-11 – 2017-08-14 (×8): 40 mg via ORAL
  Filled 2017-08-11 (×8): qty 1

## 2017-08-11 MED ORDER — GABAPENTIN 100 MG PO CAPS
100.0000 mg | ORAL_CAPSULE | Freq: Two times a day (BID) | ORAL | Status: DC
  Administered 2017-08-11 – 2017-08-14 (×8): 100 mg via ORAL
  Filled 2017-08-11 (×8): qty 1

## 2017-08-11 MED ORDER — ONDANSETRON HCL 4 MG PO TABS: 4.0000 mg | ORAL_TABLET | Freq: Four times a day (QID) | ORAL | Status: DC | PRN

## 2017-08-11 MED ORDER — ENOXAPARIN SODIUM 40 MG/0.4ML ~~LOC~~ SOLN: 40.0000 mg | SUBCUTANEOUS | Status: DC

## 2017-08-11 MED ORDER — ROPINIROLE HCL 1 MG PO TABS
1.0000 mg | ORAL_TABLET | Freq: Every day | ORAL | Status: DC
  Administered 2017-08-11 – 2017-08-13 (×4): 1 mg via ORAL
  Filled 2017-08-11 (×4): qty 1

## 2017-08-11 MED ORDER — ESCITALOPRAM OXALATE 10 MG PO TABS
10.0000 mg | ORAL_TABLET | Freq: Every day | ORAL | Status: DC
  Administered 2017-08-13 – 2017-08-14 (×2): 10 mg via ORAL
  Filled 2017-08-11 (×4): qty 1

## 2017-08-11 MED ORDER — DULOXETINE HCL 30 MG PO CPEP
30.0000 mg | ORAL_CAPSULE | Freq: Every day | ORAL | Status: DC
  Administered 2017-08-11 – 2017-08-14 (×3): 30 mg via ORAL
  Filled 2017-08-11 (×4): qty 1

## 2017-08-11 NOTE — Progress Notes (Signed)
Pts BP 96/50. MD Pyreddy notified. No new orders at this time.

## 2017-08-11 NOTE — Progress Notes (Signed)
[place] at [place]    MR#:  [id number]  DATE OF BIRTH:  03/22/1950  SUBJECTIVE:  CHIEF COMPLAINT:   Chief Complaint  Patient presents with  . Back Pain  . Fever  Patient seen and evaluated today Has low-grade fever Blood pressure on the softer side Has right flank pain  REVIEW OF SYSTEMS:    ROS  CONSTITUTIONAL: Has fever. Has fatigue, weakness. No weight gain, no weight loss.  EYES: No blurry or double vision.  ENT: No tinnitus. No postnasal drip. No redness of the oropharynx.  RESPIRATORY: No cough, no wheeze, no hemoptysis. No dyspnea.  CARDIOVASCULAR: No chest pain. No orthopnea. No palpitations. No syncope.  GASTROINTESTINAL: No nausea, no vomiting or diarrhea. No abdominal pain. No melena or hematochezia.  GENITOURINARY: No dysuria or hematuria.  ENDOCRINE: No polyuria or nocturia. No heat or cold intolerance.  HEMATOLOGY: No anemia. No bruising. No bleeding.  INTEGUMENTARY: No rashes. No lesions.  MUSCULOSKELETAL: No arthritis. No swelling. No gout. Right flank pain NEUROLOGIC: No numbness, tingling, or ataxia. No seizure-type activity.  PSYCHIATRIC: No anxiety. No insomnia. No ADD.   DRUG ALLERGIES:   Allergies  Allergen Reactions  . Codeine   . Lipitor [Atorvastatin]   . Lisinopril   . Penicillins   . Tylenol With Codeine #3 [Acetaminophen-Codeine]   . Amoxicillin Itching and Rash    VITALS:  Blood pressure (!) 96/50, pulse 78, temperature (!) 100.5 F (38.1 C), temperature source Oral, resp. rate 18, height 5\' 7"  (1.702 m), weight 86.9 kg, SpO2 100 %.  PHYSICAL EXAMINATION:   Physical Exam  GENERAL:  67 y.o.-year-old patient lying in the bed with no acute distress.  EYES: Pupils equal, round, reactive to light and accommodation. No scleral icterus. Extraocular muscles intact.  HEENT: Head atraumatic, normocephalic. Oropharynx dry and nasopharynx clear.  NECK:  Supple, no jugular venous  distention. No thyroid enlargement, no tenderness.  LUNGS: Normal breath sounds bilaterally, no wheezing, rales, rhonchi. No use of accessory muscles of respiration.  CARDIOVASCULAR: S1, S2 normal. No murmurs, rubs, or gallops.  ABDOMEN: Soft, nontender, nondistended. Bowel sounds present. No organomegaly or mass.  Right costovertebral angle tenderness noted EXTREMITIES: No cyanosis, clubbing or edema b/l.    NEUROLOGIC: Cranial nerves II through XII are intact. No focal Motor or sensory deficits b/l.   PSYCHIATRIC: The patient is alert and oriented x 3.  SKIN: No obvious rash, lesion, or ulcer.   LABORATORY PANEL:   CBC Recent Labs  Lab 08/11/17 0306  WBC 8.3  HGB 9.1*  HCT 27.8*  PLT 81*   ------------------------------------------------------------------------------------------------------------------ Chemistries  Recent Labs  Lab 08/10/17 1643 08/11/17 0306  NA 137 135  K 4.3 3.9  CL 107 106  CO2 21* 21*  GLUCOSE 111* 154*  BUN 20 17  CREATININE 1.03* 0.99  CALCIUM 9.0 8.4*  AST 38  --   ALT 20  --   ALKPHOS 94  --   BILITOT 1.1  --    ------------------------------------------------------------------------------------------------------------------  Cardiac Enzymes No results for input(s): TROPONINI in the last 168 hours. ------------------------------------------------------------------------------------------------------------------  RADIOLOGY:  Dg Chest 2 View  Result Date: 08/10/2017 CLINICAL DATA:  Fevers EXAM: CHEST - 2 VIEW COMPARISON:  None. FINDINGS: Cardiac shadow is within normal limits. The lungs are well aerated with some chronic interstitial changes. No focal infiltrate or sizable effusion is noted. No bony abnormality is seen. IMPRESSION: Chronic changes without acute abnormality. Electronically Signed   By:  Inez Catalina M.D.   On: 08/10/2017 17:14   US Renal  Result Date: 08/11/2017 CLINICAL DATA:  Abscess EXAM: RENAL / URINARY TRACT ULTRASOUND  COMPLETE COMPARISON:  10/18/2016 FINDINGS: Right Kidney: Length: 11.5 cm. Echogenicity within normal limits. No mass or hydronephrosis visualized. Left Kidney: Length: 12.8 cm. Echogenicity within normal limits. No mass or hydronephrosis visualized. Bladder: Nondistended. Splenomegaly up to 18.4 cm craniocaudal length incidentally noted. IMPRESSION: 1. Negative renal ultrasound.  No hydronephrosis. 2. Splenomegaly Electronically Signed   By: Lucrezia Europe M.D.   On: 08/11/2017 10:08     ASSESSMENT AND PLAN:  67 year old female patient with history of GERD, COPD, diabetes mellitus type 2, hyperlipidemia, hypertension, nonalcoholic cirrhosis of liver currently under hospitalist service for flank pain and UTI  -Acute pyelonephritis Continue IV Rocephin antibiotic Follow-up cultures Renal ultrasound did not reveal any abscess, hydronephrosis or any stones  -Sepsis secondary to urinary tract infection Continue IV antibiotics and follow-up cultures Follow-up lactic acid level  -Chronic thrombocytopenia secondary to liver disease Monitor platelet counts  -Type 2 diabetes mellitus Diabetic diet with sliding scale coverage with insulin  -Hypotension IV fluids  -DVT prophylaxis with sequential compression device to lower extremities   All the records are reviewed and case discussed with Care Management/Social Worker. Management plans discussed with the patient, family and they are in agreement.  CODE STATUS: Full code  DVT Prophylaxis: SCDs  TOTAL TIME TAKING CARE OF THIS PATIENT: 35 minutes.   POSSIBLE D/C IN 2 to 3 DAYS, DEPENDING ON CLINICAL CONDITION.  Saundra Shelling M.D on 08/11/2017 at 11:56 AM  Between 7am to 6pm - Pager - [phone]  After 6pm go to [email or web address] - password [email or web address] [facility]  [facility] [place] [facility]  Office  [phone]  CC: Primary care physician; Adin Hector, MD  Note: This dictation was prepared with Dragon dictation along with smaller phrase  technology. Any transcriptional errors that result from this process are unintentional.

## 2017-08-11 NOTE — Progress Notes (Signed)
Advanced care plan.  Purpose of the Encounter: CODE STATUS  Parties in Attendance: Patient  Patient's Decision Capacity: Good  Subjective/Patient's story: Presented for flank pain and fever   Objective/Medical story Has acute pyelonephritis Needs IV antibiotics   Goals of care determination:  Advance care directives and goals of care discussed Patient wants everything done which includes CPR, intubation the need arises   CODE STATUS: Full code   Time spent discussing advanced care planning: 16 minutes

## 2017-08-11 NOTE — Progress Notes (Signed)
Pt has temp 101.1 on arrival to floor. MD paged

## 2017-08-12 LAB — CBC
HCT: 28.7 % — ABNORMAL LOW (ref 35.0–47.0)
Hemoglobin: 9.3 g/dL — ABNORMAL LOW (ref 12.0–16.0)
MCH: 24.9 pg — ABNORMAL LOW (ref 26.0–34.0)
MCHC: 32.6 g/dL (ref 32.0–36.0)
MCV: 76.4 fL — ABNORMAL LOW (ref 80.0–100.0)
PLATELETS: 66 10*3/uL — AB (ref 150–440)
RBC: 3.75 MIL/uL — AB (ref 3.80–5.20)
RDW: 19 % — AB (ref 11.5–14.5)
WBC: 4.3 10*3/uL (ref 3.6–11.0)

## 2017-08-12 LAB — GLUCOSE, CAPILLARY
GLUCOSE-CAPILLARY: 176 mg/dL — AB (ref 70–99)
GLUCOSE-CAPILLARY: 255 mg/dL — AB (ref 70–99)
Glucose-Capillary: 223 mg/dL — ABNORMAL HIGH (ref 70–99)
Glucose-Capillary: 175 mg/dL — ABNORMAL HIGH (ref 70–99)

## 2017-08-12 LAB — BASIC METABOLIC PANEL
ANION GAP: 8 (ref 5–15)
BUN: 19 mg/dL (ref 8–23)
CO2: 22 mmol/L (ref 22–32)
Calcium: 8.3 mg/dL — ABNORMAL LOW (ref 8.9–10.3)
Chloride: 108 mmol/L (ref 98–111)
Creatinine, Ser: 1.15 mg/dL — ABNORMAL HIGH (ref 0.44–1.00)
GFR, EST AFRICAN AMERICAN: 56 mL/min — AB (ref >60)
GFR, EST NON AFRICAN AMERICAN: 48 mL/min — AB (ref >60)
Glucose, Bld: 163 mg/dL — ABNORMAL HIGH (ref 70–99)
Potassium: 3.6 mmol/L (ref 3.5–5.1)
Sodium: 138 mmol/L (ref 135–145)

## 2017-08-12 LAB — HIV ANTIBODY (ROUTINE TESTING W REFLEX): HIV Screen 4th Generation wRfx: NONREACTIVE

## 2017-08-12 NOTE — Progress Notes (Signed)
Pt alert and oriented. Medicated for pain with good results. Iv infusing without difficulty. Remaining afebrile this shift. Pt able to sleep in between care.

## 2017-08-12 NOTE — Progress Notes (Signed)
[place] at [place]    MR#:  [id number]  DATE OF BIRTH:  03/22/1950  SUBJECTIVE:  CHIEF COMPLAINT:   Chief Complaint  Patient presents with  . Back Pain  . Fever  Patient seen and evaluated today Has no fever Blood pressure proved with IV fluids Has low back pain  REVIEW OF SYSTEMS:    ROS  CONSTITUTIONAL: No fever. Has fatigue, weakness. No weight gain, no weight loss.  EYES: No blurry or double vision.  ENT: No tinnitus. No postnasal drip. No redness of the oropharynx.  RESPIRATORY: No cough, no wheeze, no hemoptysis. No dyspnea.  CARDIOVASCULAR: No chest pain. No orthopnea. No palpitations. No syncope.  GASTROINTESTINAL: No nausea, no vomiting or diarrhea. No abdominal pain. No melena or hematochezia.  GENITOURINARY: No dysuria or hematuria.  ENDOCRINE: No polyuria or nocturia. No heat or cold intolerance.  HEMATOLOGY: No anemia. No bruising. No bleeding.  INTEGUMENTARY: No rashes. No lesions.  MUSCULOSKELETAL: No arthritis. No swelling. No gout. Back pain on and off NEUROLOGIC: No numbness, tingling, or ataxia. No seizure-type activity.  PSYCHIATRIC: No anxiety. No insomnia. No ADD.   DRUG ALLERGIES:   Allergies  Allergen Reactions  . Codeine   . Lipitor [Atorvastatin]   . Lisinopril   . Penicillins   . Tylenol With Codeine #3 [Acetaminophen-Codeine]   . Amoxicillin Itching and Rash    VITALS:  Blood pressure 110/61, pulse 66, temperature 98.1 F (36.7 C), temperature source Oral, resp. rate 19, height 5\' 7"  (1.702 m), weight 86.9 kg, SpO2 99 %.  PHYSICAL EXAMINATION:   Physical Exam  GENERAL:  67 y.o.-year-old patient lying in the bed with no acute distress.  EYES: Pupils equal, round, reactive to light and accommodation. No scleral icterus. Extraocular muscles intact.  HEENT: Head atraumatic, normocephalic. Oropharynx dry and nasopharynx clear.  NECK:  Supple, no jugular venous distention.  No thyroid enlargement, no tenderness.  LUNGS: Normal breath sounds bilaterally, no wheezing, rales, rhonchi. No use of accessory muscles of respiration.  CARDIOVASCULAR: S1, S2 normal. No murmurs, rubs, or gallops.  ABDOMEN: Soft, nontender, nondistended. Bowel sounds present. No organomegaly or mass.  EXTREMITIES: No cyanosis, clubbing or edema b/l.    NEUROLOGIC: Cranial nerves II through XII are intact. No focal Motor or sensory deficits b/l.   PSYCHIATRIC: The patient is alert and oriented x 3.  SKIN: No obvious rash, lesion, or ulcer.   LABORATORY PANEL:   CBC Recent Labs  Lab 08/12/17 0432  WBC 4.3  HGB 9.3*  HCT 28.7*  PLT 66*   ------------------------------------------------------------------------------------------------------------------ Chemistries  Recent Labs  Lab 08/10/17 1643  08/12/17 0432  NA 137   < > 138  K 4.3   < > 3.6  CL 107   < > 108  CO2 21*   < > 22  GLUCOSE 111*   < > 163*  BUN 20   < > 19  CREATININE 1.03*   < > 1.15*  CALCIUM 9.0   < > 8.3*  AST 38  --   --   ALT 20  --   --   ALKPHOS 94  --   --   BILITOT 1.1  --   --    < > = values in this interval not displayed.   ------------------------------------------------------------------------------------------------------------------  Cardiac Enzymes No results for input(s): TROPONINI in the last 168 hours. ------------------------------------------------------------------------------------------------------------------  RADIOLOGY:  Dg Chest 2 View  Result Date: 08/10/2017 CLINICAL DATA:  Fevers EXAM: CHEST - 2 VIEW COMPARISON:  None. FINDINGS: Cardiac shadow is within normal limits. The lungs are well aerated with some chronic interstitial changes. No focal infiltrate or sizable effusion is noted. No bony abnormality is seen. IMPRESSION: Chronic changes without acute abnormality. Electronically Signed   By: Inez Catalina M.D.   On: 08/10/2017 17:14   US Renal  Result Date:  08/11/2017 CLINICAL DATA:  Abscess EXAM: RENAL / URINARY TRACT ULTRASOUND COMPLETE COMPARISON:  10/18/2016 FINDINGS: Right Kidney: Length: 11.5 cm. Echogenicity within normal limits. No mass or hydronephrosis visualized. Left Kidney: Length: 12.8 cm. Echogenicity within normal limits. No mass or hydronephrosis visualized. Bladder: Nondistended. Splenomegaly up to 18.4 cm craniocaudal length incidentally noted. IMPRESSION: 1. Negative renal ultrasound.  No hydronephrosis. 2. Splenomegaly Electronically Signed   By: Lucrezia Europe M.D.   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 Management plans discussed with the patient, family and they are in agreement.  CODE STATUS: Full code  DVT Prophylaxis: SCDs  TOTAL TIME TAKING CARE OF THIS PATIENT: 35 minutes.   POSSIBLE D/C IN 2 to 3 DAYS, DEPENDING ON CLINICAL CONDITION.  Saundra Shelling M.D on 08/12/2017 at 10:58 AM  Between 7am to 6pm - Pager - [phone]  After 6pm go to [email or web address] - password [email or web address] [facility]  [facility] [place] [facility]  Office  [phone]  CC: Primary care physician; Adin Hector,  MD  Note: This dictation was prepared with Dragon dictation along with smaller phrase technology. Any transcriptional errors that result from this process are unintentional.

## 2017-08-13 DIAGNOSIS — D696 Thrombocytopenia, unspecified: Secondary | ICD-10-CM

## 2017-08-13 DIAGNOSIS — F419 Anxiety disorder, unspecified: Secondary | ICD-10-CM

## 2017-08-13 DIAGNOSIS — N3941 Urge incontinence: Secondary | ICD-10-CM

## 2017-08-13 DIAGNOSIS — B9620 Unspecified Escherichia coli [E. coli] as the cause of diseases classified elsewhere: Secondary | ICD-10-CM

## 2017-08-13 DIAGNOSIS — G8929 Other chronic pain: Secondary | ICD-10-CM

## 2017-08-13 DIAGNOSIS — F17210 Nicotine dependence, cigarettes, uncomplicated: Secondary | ICD-10-CM

## 2017-08-13 DIAGNOSIS — Z888 Allergy status to other drugs, medicaments and biological substances status: Secondary | ICD-10-CM

## 2017-08-13 DIAGNOSIS — E119 Type 2 diabetes mellitus without complications: Secondary | ICD-10-CM

## 2017-08-13 DIAGNOSIS — Z8619 Personal history of other infectious and parasitic diseases: Secondary | ICD-10-CM

## 2017-08-13 DIAGNOSIS — Z881 Allergy status to other antibiotic agents status: Secondary | ICD-10-CM

## 2017-08-13 DIAGNOSIS — Z90710 Acquired absence of both cervix and uterus: Secondary | ICD-10-CM

## 2017-08-13 DIAGNOSIS — N390 Urinary tract infection, site not specified: Secondary | ICD-10-CM

## 2017-08-13 DIAGNOSIS — K7460 Unspecified cirrhosis of liver: Secondary | ICD-10-CM

## 2017-08-13 DIAGNOSIS — Z9049 Acquired absence of other specified parts of digestive tract: Secondary | ICD-10-CM

## 2017-08-13 DIAGNOSIS — M549 Dorsalgia, unspecified: Secondary | ICD-10-CM

## 2017-08-13 DIAGNOSIS — Z794 Long term (current) use of insulin: Secondary | ICD-10-CM

## 2017-08-13 DIAGNOSIS — Z9089 Acquired absence of other organs: Secondary | ICD-10-CM

## 2017-08-13 DIAGNOSIS — R011 Cardiac murmur, unspecified: Secondary | ICD-10-CM

## 2017-08-13 DIAGNOSIS — Z79899 Other long term (current) drug therapy: Secondary | ICD-10-CM

## 2017-08-13 DIAGNOSIS — K766 Portal hypertension: Secondary | ICD-10-CM

## 2017-08-13 DIAGNOSIS — I10 Essential (primary) hypertension: Secondary | ICD-10-CM

## 2017-08-13 DIAGNOSIS — R161 Splenomegaly, not elsewhere classified: Secondary | ICD-10-CM

## 2017-08-13 DIAGNOSIS — J449 Chronic obstructive pulmonary disease, unspecified: Secondary | ICD-10-CM

## 2017-08-13 DIAGNOSIS — Z1612 Extended spectrum beta lactamase (ESBL) resistance: Secondary | ICD-10-CM

## 2017-08-13 DIAGNOSIS — F339 Major depressive disorder, recurrent, unspecified: Secondary | ICD-10-CM

## 2017-08-13 DIAGNOSIS — I8510 Secondary esophageal varices without bleeding: Secondary | ICD-10-CM

## 2017-08-13 DIAGNOSIS — Z880 Allergy status to penicillin: Secondary | ICD-10-CM

## 2017-08-13 LAB — GLUCOSE, CAPILLARY
GLUCOSE-CAPILLARY: 165 mg/dL — AB (ref 70–99)
GLUCOSE-CAPILLARY: 221 mg/dL — AB (ref 70–99)
Glucose-Capillary: 130 mg/dL — ABNORMAL HIGH (ref 70–99)
Glucose-Capillary: 141 mg/dL — ABNORMAL HIGH (ref 70–99)

## 2017-08-13 LAB — URINE CULTURE: Culture: >=100000 — AB

## 2017-08-13 MED ORDER — INSULIN DETEMIR 100 UNIT/ML ~~LOC~~ SOLN
10.0000 [IU] | Freq: Two times a day (BID) | SUBCUTANEOUS | Status: DC
  Administered 2017-08-13 – 2017-08-14 (×2): 10 [IU] via SUBCUTANEOUS
  Filled 2017-08-13 (×3): qty 0.1

## 2017-08-13 MED ORDER — SODIUM CHLORIDE 0.9 % IV SOLN
1.0000 g | Freq: Three times a day (TID) | INTRAVENOUS | Status: DC
  Administered 2017-08-13 – 2017-08-14 (×4): 1 g via INTRAVENOUS
  Filled 2017-08-13 (×6): qty 1

## 2017-08-13 NOTE — Progress Notes (Signed)
[facility] [place]    MR#:  [id number]  DATE OF BIRTH:  [birth year]  SUBJECTIVE:  CHIEF COMPLAINT:   Chief Complaint  Patient presents with  . Back Pain  . Fever  Patient seen and evaluated today Has no fever Has low back pain on and off No nausea and vomiting  REVIEW OF SYSTEMS:    ROS  CONSTITUTIONAL: No fever. Has fatigue, weakness. No weight gain, no weight loss.  EYES: No blurry or double vision.  ENT: No tinnitus. No postnasal drip. No redness of the oropharynx.  RESPIRATORY: No cough, no wheeze, no hemoptysis. No dyspnea.  CARDIOVASCULAR: No chest pain. No orthopnea. No palpitations. No syncope.  GASTROINTESTINAL: No nausea, no vomiting or diarrhea. No abdominal pain. No melena or hematochezia.  GENITOURINARY: No dysuria or hematuria.  ENDOCRINE: No polyuria or nocturia. No heat or cold intolerance.  HEMATOLOGY: No anemia. No bruising. No bleeding.  INTEGUMENTARY: No rashes. No lesions.  MUSCULOSKELETAL: No arthritis. No swelling. No gout. Back pain on and off NEUROLOGIC: No numbness, tingling, or ataxia. No seizure-type activity.  PSYCHIATRIC: No anxiety. No insomnia. No ADD.   DRUG ALLERGIES:   Allergies  Allergen Reactions  . Codeine   . Lipitor [Atorvastatin]   . Lisinopril   . Penicillins   . Tylenol With Codeine #3 [Acetaminophen-Codeine]   . Amoxicillin Itching and Rash    VITALS:  Blood pressure 118/77, pulse 78, temperature 98.2 F (36.8 C), temperature source Oral, resp. rate 18, height 5\' 7"  (1.702 m), weight 86.9 kg, SpO2 97 %.  PHYSICAL EXAMINATION:   Physical Exam  GENERAL:  67 y.o.-year-old patient lying in the bed with no acute distress.  EYES: Pupils equal, round, reactive to light and accommodation. No scleral icterus. Extraocular muscles intact.  HEENT: Head atraumatic, normocephalic. Oropharynx dry and nasopharynx clear.  NECK:  Supple, no jugular venous distention. No  thyroid enlargement, no tenderness.  LUNGS: Normal breath sounds bilaterally, no wheezing, rales, rhonchi. No use of accessory muscles of respiration.  CARDIOVASCULAR: S1, S2 normal. No murmurs, rubs, or gallops.  ABDOMEN: Soft, nontender, nondistended. Bowel sounds present. No organomegaly or mass.  EXTREMITIES: No cyanosis, clubbing or edema b/l.    NEUROLOGIC: Cranial nerves II through XII are intact. No focal Motor or sensory deficits b/l.   PSYCHIATRIC: The patient is alert and oriented x 3.  SKIN: No obvious rash, lesion, or ulcer.   LABORATORY PANEL:   CBC Recent Labs  Lab 08/12/17 0432  WBC 4.3  HGB 9.3*  HCT 28.7*  PLT 66*   ------------------------------------------------------------------------------------------------------------------ Chemistries  Recent Labs  Lab 08/10/17 1643  08/12/17 0432  NA 137   < > 138  K 4.3   < > 3.6  CL 107   < > 108  CO2 21*   < > 22  GLUCOSE 111*   < > 163*  BUN 20   < > 19  CREATININE 1.03*   < > 1.15*  CALCIUM 9.0   < > 8.3*  AST 38  --   --   ALT 20  --   --   ALKPHOS 94  --   --   BILITOT 1.1  --   --    < > = values in this interval not displayed.   ------------------------------------------------------------------------------------------------------------------  Cardiac Enzymes No results for input(s): TROPONINI in the last 168 hours. ------------------------------------------------------------------------------------------------------------------  RADIOLOGY:  No results found.   ASSESSMENT AND PLAN:  67 year old female patient with history of GERD, COPD, diabetes mellitus type 2, hyperlipidemia, hypertension, nonalcoholic cirrhosis of liver currently under hospitalist service for flank pain and UTI  -E. 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 Management plans discussed with the patient, family and they are in agreement.  CODE STATUS: Full code  DVT Prophylaxis: SCDs  TOTAL TIME TAKING CARE OF THIS PATIENT: 35 minutes.   POSSIBLE D/C IN 2 to 3 DAYS, DEPENDING ON CLINICAL CONDITION.  Saundra Shelling M.D on 08/13/2017 at 1:25 PM  Between 7am to 6pm - Pager - [phone]  After 6pm go to [email or web address] - password [email or web address] [facility]  [employer]  Office  [phone]  CC: Primary care physician; Adin Hector, MD  Note: This dictation was prepared with Dragon dictation along with smaller phrase technology. Any transcriptional errors that result from this process are unintentional.

## 2017-08-13 NOTE — Progress Notes (Signed)
Inpatient Diabetes Program Recommendations  AACE/ADA: New Consensus Statement on Inpatient Glycemic Control (2015)  Target Ranges:  Prepandial:   less than 140 mg/dL      Peak postprandial:   less than 180 mg/dL (1-2 hours)      Critically ill patients:  140 - 180 mg/dL   Results for Vanessa Oneill, Vanessa Oneill (MRN [id number]) as of 08/13/2017 12:36  Ref. Range 08/12/2017 07:43 08/12/2017 11:40 08/12/2017 16:44 08/12/2017 21:14  Glucose-Capillary Latest Ref Range: 70 - 99 mg/dL 176 (H)  2 units NOVOLOG  255 (H)  5 units NOVOLOG  223 (H)  3 units NOVOLOG  175 (H)   Results for Vanessa Oneill, Vanessa Oneill (MRN [id number]) as of 08/13/2017 12:36  Ref. Range 08/13/2017 08:08 08/13/2017 11:49  Glucose-Capillary Latest Ref Range: 70 - 99 mg/dL 165 (H)  2 units NOVOLOG  221 (H)    Home DM Meds: NPH Insulin 30 units AM/ 20 units Lunch/ 30 units PM        Regular Insulin 5 units TID        Metformin 1000 mg QPM        Amaryl 4 mg daily   Current Orders: Novolog Sensitive Correction Scale/ SSI (0-9 units) TID AC + HS      MD- Please consider the following in-hospital insulin adjustments:   1. Start Levemir 8 units QHS (0.1 units/kg dosing)  2. Start Novolog Meal Coverage: Novolog 3 units TID with meals (Please add the following Hold Parameters: Hold if pt eats <50% of meal, Hold if pt NPO)      --Will follow patient during hospitalization--  Wyn Quaker RN, MSN, CDE Diabetes Coordinator Inpatient Glycemic Control Team Team Pager: [phone] (8a-5p)

## 2017-08-13 NOTE — Progress Notes (Signed)
Pt remaining alert and oriented. Still with complaints of right flank pain. Pt able to sleep during the night without difficulty. Iv infusing without difficulty.

## 2017-08-13 NOTE — Progress Notes (Addendum)
ID Full consult note to follow 67 yr female with cirrhosis, thrombocytopenia, DM, HTN, h/o ESBL e.coli bacteremia and urosepsis in Oct 2018 was admitted with fever and chills and back pain, Has been on ceftriaxone since admission and it was switched to meropenem today after urine culture - esbl e.coli  Check post void bladder scan. Continue meropenem - ideally would need a carbapenem for 5 days as her clinical picture is  not simple cystitis . Nitrofurantoin does not have good renal penetration . Pt is not keen to go home on IV  As she improved on ceftriaxone(  that was resistant )and fever resolved, and she does not have bacteremia   we may be able to switch her to PO tomorrow after evaluation  She will need uro gyn follow up as OP

## 2017-08-13 NOTE — Consult Note (Addendum)
67 year old female with a history of diabetes mellitus, cirrhosis of the liver with esophageal varices, COPD, history of ESBL E. coli bacteremia and urosepsis presented to the emergency department on 08/10/2017 with a history of chills and back pain.  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 On Thursday she had some chills and noted pain in the back especially on the left flank as well as the lower back.   She came to the ED on 08/10/2017 as she was not feeling any better.  She was also concerned that previously she had E. coli bacteremia and wondered whether it was a similar problem. She did not have any hematuria, dysuria, abdominal pain.  She had nausea and vomiting one episode while in the ED.  Her last antibiotic use was 4 weeks ago when she took 3 to 4 days of nitrofurantoin.   In the ED her temperature was 99.2 and blood pressure was 107/56 and later the temperature went up to 101.8.  UA showed 21-50 WBCs.  WBC was 8, hemoglobin 10.1 and platelet was 99.  Creatinine was 1.03.  Urine culture and blood culture was sent and she was started on ceftriaxone principal diagnosis of pyelonephritis.  She was urine culture came positive for ESBL E. coli and the ceftriaxone was switched to meropenem today.  But patient is already started feeling better with no fever and the pain on the back is much better at over the left flank is much improved even before meropenem was started. I am asked to see the patient for antibiotic recommendation. Patient was admitted to the hospital in December October 2018 with similar complaints and was found to have ESBL E. coli bacteremia and urosepsis.  She was treated with intravenous ertapenem for 2 weeks.  Following which she saw a urologist and says that everything was okay.  Patient wears a pad continuously because of urge incontinence.     Past Medical History:   Anemia Thrombocytopenia Cerebral aneurysm which was clipped  many years ago Cirrhosis which is nonalcoholic Colon polyp which has been removed COPD Depression with anxiety Diabetes mellitus Esophageal varices with GI bleed necessitating blood transfusion. Fibrocystic breast disease Hyperlipidemia Mitral valve regurgitation Osteoarthritis Restless leg syndrome Tubular pregnancy     Past Surgical History:   Appendectomy Cholecystectomy Adenoma of the colon removed Hysterectomy Tonsillectomy  Social history Lives with husband Has 3 dogs at home Smoker 1 pack/day Stop drinking alcohol 10 years ago No illicit drug use  Family History Heart failure Mother  . Myocardial Infarction (Heart attack) Mother  . Lung cancer Father  . Myocardial Infarction (Heart attack) Father  . Stroke Father  . Cancer Sister  gliobalstoma    Current outpatient medications Xanax Gabapentin Glimepiride Hydrochlorothiazide Insulin Metformin Losartan Ropinirole Trazodone  Allergies Amoxicillin causes a rash Lipitor caused muscle pain Lisinopril causes cough Penicillin    ROS Constitutional: Fever, no weight loss Eyes: No blurring of vision or discharge HEENT: No nasal congestion, sore throat Respiratory system: No cough or shortness of breath Cardiovascular system: No chest pain, palpitations, pedal edema, cold extremities Gastrointestinal system: No pain abdomen, constipation, diarrhea. Genitourinary system: No dysuria or hematuria.  Has urge incontinence and has to go to the bathroom frequently. Central nervous system: No dizziness, headache, seizures, weakness on one side. Endocrine: Says that sugars around 200 Skin: No rash or bruising Musculoskeletal system: Has chronic back pain and uses a heating pad. Allergies as indicated above.   Physical  examination Vital signs:  Patient Vitals for the past 24 hrs:  BP Temp Temp src Pulse Resp SpO2  08/13/17 1654 (!) 148/70 98 F (36.7 C) - 76 18 97 %  08/13/17 0808 118/77 98.2 F  (36.8 C) Oral 78 18 97 %  08/12/17 2345 124/66 99.8 F (37.7 C) Oral 82 17 93 %    General: Awake, tired, pale, oriented x5, no distress HEENT: Tongue coated, no thrush, pupils equal reacting to light, scalp normal Cardiovascular: I7-P8, 2 x 6 systolic murmur, no ankle edema Respiratory: Lungs clear to auscultate, no crepitations or rhonchi Gastrointestinal: Abdomen soft, obese, spleen palpable, no tenderness, bowel sounds normal Neurological: Moves all limbs, nonfocal examination. Skin: No rash or bruising Musculoskeletal system: No joint swelling  Immunological: No lymph nodes palpable,   Labs WBC was 8 on admission today it is 4.3 Hb 9.3 PLT 66 CR 1.03->1.15 LFT AST 38 ALT 20 total bili 1.1, alkaline phosphatase 94 Microbiology Blood culture 08/10/2017 no growth Urine culture 08/10/2017 ESBL E. coli sensitive to Zosyn, carbapems, nitrofurantoin  Radiology Chest x-ray reviewed personally normal Ultrasound reviewed.  No hydronephrosis.  Splenomegaly present  Assessment and plan 67 year old female with a history of diabetes mellitus, cirrhosis of the liver with esophageal varices, COPD, history of ESBL E. coli bacteremia and urosepsis presented to the emergency department on 08/10/2017 with a history of chills and back pain.   ESBL E. coli urinary tract infection.  The clinical picture is not of simple cystitis.  It is uncomplicated pyelonephritis.  Because of recurrence we need to make sure that she does not have any incomplete bladder emptying. Ultrasound does not show any hydronephrosis or renal stone. Recommend post void bladder scan to look for any residual volume. Patient was on ceftriaxone since admission until today when it was changed to meropenem after the cultures were obtained.  But patient improved clinically on ceftriaxone which is resistant to E. coli . She is currently on meropenem.  Ideally she will need at least 5 days of Carbapenem to treat like uncomplicated  pyelonephritis.  The patient is not keen on getting IV antibiotics at home.  Because of clinical improvement without the right antibiotic and no bacteremia we may be able to switch her to oral nitrofurantoin or oral fosfomycin after 24 hours of meropenem.  She will not need more than 5 days of antibiotics . As she has had a ESBL E. coli bacteremia last year she will benefit from a urogynecology follow-up to look for any sphincter abnormality or other pathology.  Diabetes mellitus: As per patient not pelvis under control.  She says her last hemoglobin A1c was 8.6.  She is on insulin metformin and glimepiride.  Cirrhosis with esophageal varices.  Patient on nadolol for portal hypertension  Anxiety and depression.  On Lexapro, Cymbalta and trazodone with as needed Xanax  Nonalcoholic cirrhosis with thrombocytopenia.  Compensated.  Discussed the management with patient and her husband at bedside.Answered her questions. Discussed with the hospitalist

## 2017-08-14 LAB — GLUCOSE, CAPILLARY: GLUCOSE-CAPILLARY: 201 mg/dL — AB (ref 70–99)

## 2017-08-14 MED ORDER — NITROFURANTOIN MACROCRYSTAL 100 MG PO CAPS: 100.0000 mg | ORAL_CAPSULE | Freq: Four times a day (QID) | ORAL | 0 refills | Status: AC

## 2017-08-14 NOTE — Discharge Summary (Signed)
[facility] NAME: Vanessa Oneill    MR#:  [id number]  DATE OF BIRTH:  [birth year]  DATE OF ADMISSION:  08/10/2017 ADMITTING PHYSICIAN: Lance Coon, MD  DATE OF DISCHARGE: 08/14/2017  PRIMARY CARE PHYSICIAN: Adin Hector, MD   ADMISSION DIAGNOSIS:  Pyelonephritis [N12] Hypertension Hyperlipidemia Type 2 diabetes mellitus DISCHARGE DIAGNOSIS:  Principal Problem:   Pyelonephritis Active Problems:   HTN (hypertension)   HLD (hyperlipidemia)   Diabetes (HCC)   COPD (chronic obstructive pulmonary disease) (HCC)   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HCC) ESBL E. coli urinary tract infection Sepsis secondary to E. coli urinary tract infection  SECONDARY DIAGNOSIS:   Past Medical History:  Diagnosis Date  . Acid reflux   . Anxiety   . Arthritis   . Cerebral aneurysm   . COPD (chronic obstructive pulmonary disease) ([place])   . Depression   . Diabetes mellitus without complication ([place])   . Fibrocystic breast disease   . Hyperlipidemia   . Hypertension   . Liver disease   . Mitral valve regurgitation   . 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[place])   . Personal history of tobacco use, presenting hazards to health 11/12/2014  . Raynaud's disease   . Sleep apnea      ADMITTING HISTORY Vanessa Oneill  is a 67 y.o. female who presents with chief complaint as above.  She states her back pain started within the last day or 2.  She is also had some dysuria.  Here in the ED she is found of UTI, with clinical symptoms of pyelonephritis.  Other work-up is largely within normal limits and the patient did not meet sepsis criteria.  However, given her significant discomfort hospitalist were called for admission  HOSPITAL COURSE:  Patient was admitted to medical floor.  She received IV fluids and IV ceftriaxone antibiotic.  Urine cultures grew ESBL E. coli and she was switched to IV meropenem antibiotic.  Patient had fever and low blood pressure during the  hospitalization which improved with IV fluids and antibiotics.  She was worked up with renal ultrasound which showed no abscess and no hydronephrosis.  Patient was also worked up with chest x-ray which showed no acute abnormality. Infectious disease consultation was done in the hospitalization.  Patient tolerated antibiotics well her fever resolved blood pressure improved.  She will be discharged home on oral antibiotics and will follow-up with primary care physician in the clinic.  CONSULTS OBTAINED:  Treatment Team:  Tsosie Billing, MD  DRUG ALLERGIES:   Allergies  Allergen Reactions  . Codeine   . Lipitor [Atorvastatin]   . Lisinopril   . Penicillins   . Tylenol With Codeine #3 [Acetaminophen-Codeine]   . Amoxicillin Itching and Rash    DISCHARGE MEDICATIONS:   Allergies as of 08/14/2017      Reactions   Codeine    Lipitor [atorvastatin]    Lisinopril    Penicillins    Tylenol With Codeine #3 [acetaminophen-codeine]    Amoxicillin Itching, Rash      Medication List    TAKE these medications   ALPRAZolam 0.25 MG tablet Commonly known as:  XANAX Take 0.25 mg by mouth at bedtime as needed for sleep.   fluticasone 50 MCG/ACT nasal spray Commonly known as:  FLONASE Place 2 sprays into both nostrils 2 (two) times daily.   gabapentin 100 MG capsule Commonly known as:  NEURONTIN Take 100 mg by mouth 2 (two) times daily.   glimepiride 4 MG tablet Commonly  known as:  AMARYL Take 4 mg by mouth daily with breakfast.   hydrochlorothiazide 25 MG tablet Commonly known as:  HYDRODIURIL Take 25 mg by mouth daily.   HYDROcodone-acetaminophen 5-325 MG tablet Commonly known as:  NORCO/VICODIN Take 1 tablet by mouth every 8 (eight) hours as needed for moderate pain.   insulin NPH Human 100 UNIT/ML injection Commonly known as:  HUMULIN N,NOVOLIN N Inject 0.1 mLs (10 Units total) into the skin 2 (two) times daily before a meal. Use 30 units in the morning, 20 units  during the day and 30 units at night.   insulin regular 100 units/mL injection Commonly known as:  NOVOLIN R,HUMULIN R Inject 0.05 mLs (5 Units total) into the skin 3 (three) times daily.   meloxicam 15 MG tablet Commonly known as:  MOBIC Take 15 mg by mouth daily as needed for pain.   metFORMIN 500 MG 24 hr tablet Commonly known as:  GLUCOPHAGE-XR Take 1,000 mg by mouth every evening.   nitrofurantoin 100 MG capsule Commonly known as:  MACRODANTIN Take 1 capsule (100 mg total) by mouth 4 (four) times daily for 5 days.   pantoprazole 40 MG tablet Commonly known as:  PROTONIX Take 40 mg by mouth 2 (two) times daily.   rOPINIRole 1 MG tablet Commonly known as:  REQUIP Take 1 mg by mouth at bedtime.   traZODone 50 MG tablet Commonly known as:  DESYREL Take 50 mg by mouth at bedtime.       Today  Patient seen and evaluated on the day of discharge No fever Tolerating diet well Blood Pressure stable VITAL SIGNS:  Blood pressure (!) 92/55, pulse 63, temperature 97.7 F (36.5 C), temperature source Oral, resp. rate 18, height 5\' 7"  (1.702 m), weight 86.9 kg, SpO2 94 %.  I/O:    Intake/Output Summary (Last 24 hours) at 08/14/2017 1027 Last data filed at 08/13/2017 1804 Gross per 24 hour  Intake 1296 ml  Output -  Net 1296 ml    PHYSICAL EXAMINATION:  Physical Exam  GENERAL:  67 y.o.-year-old patient lying in the bed with no acute distress.  LUNGS: Normal breath sounds bilaterally, no wheezing, rales,rhonchi or crepitation. No use of accessory muscles of respiration.  CARDIOVASCULAR: S1, S2 normal. No murmurs, rubs, or gallops.  ABDOMEN: Soft, non-tender, non-distended. Bowel sounds present. No organomegaly or mass.  NEUROLOGIC: Moves all 4 extremities. PSYCHIATRIC: The patient is alert and oriented x 3.  SKIN: No obvious rash, lesion, or ulcer.   DATA REVIEW:   CBC Recent Labs  Lab 08/12/17 0432  WBC 4.3  HGB 9.3*  HCT 28.7*  PLT 66*    Chemistries   Recent Labs  Lab 08/10/17 1643  08/12/17 0432  NA 137   < > 138  K 4.3   < > 3.6  CL 107   < > 108  CO2 21*   < > 22  GLUCOSE 111*   < > 163*  BUN 20   < > 19  CREATININE 1.03*   < > 1.15*  CALCIUM 9.0   < > 8.3*  AST 38  --   --   ALT 20  --   --   ALKPHOS 94  --   --   BILITOT 1.1  --   --    < > = values in this interval not displayed.    Cardiac Enzymes No results for input(s): TROPONINI in the last 168 hours.  Microbiology Results  Results for orders  placed or performed during the hospital encounter of 08/10/17  Urine Culture     Status: Abnormal   Collection Time: 08/10/17  4:43 PM  Result Value Ref Range Status   Specimen Description   Final    URINE, RANDOM Performed at [facility], [address], [place], [place] [address]    Special Requests   Final    NONE Performed at [facility], [address] [place], [place] [address]    Culture (A)  Final    >=100,000 COLONIES/mL ESCHERICHIA COLI Confirmed Extended Spectrum Beta-Lactamase Producer (ESBL).  In bloodstream infections from ESBL organisms, carbapenems are preferred over piperacillin/tazobactam. They are shown to have a lower risk of mortality.    Report Status 08/13/2017 FINAL  Final   Organism ID, Bacteria ESCHERICHIA COLI (A)  Final      Susceptibility   Escherichia coli - MIC*    AMPICILLIN >=32 RESISTANT Resistant     CEFAZOLIN >=64 RESISTANT Resistant     CEFTRIAXONE >=64 RESISTANT Resistant     CIPROFLOXACIN >=4 RESISTANT Resistant     GENTAMICIN <=1 SENSITIVE Sensitive     IMIPENEM <=0.25 SENSITIVE Sensitive     NITROFURANTOIN <=16 SENSITIVE Sensitive     TRIMETH/SULFA >=320 RESISTANT Resistant     AMPICILLIN/SULBACTAM 16 INTERMEDIATE Intermediate     PIP/TAZO <=4 SENSITIVE Sensitive     Extended ESBL POSITIVE Resistant     * >=100,000 COLONIES/mL ESCHERICHIA COLI  Blood culture (routine x 2)     Status: None (Preliminary result)   Collection Time: 08/10/17  8:29 PM   Result Value Ref Range Status   Specimen Description BLOOD RIGHT HAND  Final   Special Requests   Final    BOTTLES DRAWN AEROBIC AND ANAEROBIC Blood Culture adequate volume   Culture   Final    NO GROWTH 4 DAYS Performed at [facility], [address], [place], [place] [address]    Report Status PENDING  Incomplete  Blood culture (routine x 2)     Status: None (Preliminary result)   Collection Time: 08/10/17  8:29 PM  Result Value Ref Range Status   Specimen Description BLOOD RIGHT FOREARM  Final   Special Requests   Final    BOTTLES DRAWN AEROBIC AND ANAEROBIC Blood Culture results may not be optimal due to an excessive volume of blood received in culture bottles   Culture   Final    NO GROWTH 4 DAYS Performed at [facility], [address], [place],  [address]    Report Status PENDING  Incomplete    RADIOLOGY:  No results found.  Follow up with PCP in 1 week.  Management plans discussed with the patient, family and they are in agreement.  CODE STATUS: Full code    Code Status Orders  (From admission, onward)         Start     Ordered   08/11/17 0032  Full code  Continuous     08/11/17 0032        Code Status History    Date Active Date Inactive Code Status Order ID Comments User Context   02/06/2017 0236 02/08/2017 1406 Full Code [id number]  Saundra Shelling, MD Inpatient   10/18/2016 0112 10/24/2016 2041 Full Code [id number]  Demetrios Loll, MD Inpatient   07/22/2016 1050 07/25/2016 2042 Full Code [id number]  Bettey Costa, MD ED    Advance Directive Documentation     Most Recent Value  Type of Advance Directive  Living will, Healthcare Power of Attorney  Pre-existing out of facility DNR order (yellow form or pink MOST form)  -  "MOST" Form in Place?  -      TOTAL TIME TAKING CARE OF THIS PATIENT ON DAY OF DISCHARGE: more than 34 minutes.   Saundra Shelling M.D on 08/14/2017 at 10:27 AM  Between 7am to 6pm - Pager - [phone]  After 6pm  go to [email or web address] - password [email or web address] [facility]  [facility] [place]  Office  [phone]  CC: Primary care physician; Adin Hector, MD  Note: This dictation was prepared with Dragon dictation along with smaller phrase technology. Any transcriptional errors that result from this process are unintentional.

## 2017-08-14 NOTE — Progress Notes (Signed)
Patient is being discharged home with family. 2 IVs removed with caths intact. Reviewed meds, scripts, and last dose given.  Allowed time for questions. Scripts was sent with patient.

## 2017-08-15 LAB — CULTURE, BLOOD (ROUTINE X 2)
Culture: NO GROWTH
Culture: NO GROWTH
Special Requests: ADEQUATE

## 2017-11-05 ENCOUNTER — Other Ambulatory Visit: Payer: Self-pay | Admitting: Internal Medicine

## 2017-11-05 DIAGNOSIS — Z1231 Encounter for screening mammogram for malignant neoplasm of breast: Secondary | ICD-10-CM

## 2017-12-09 ENCOUNTER — Other Ambulatory Visit
Admission: RE | Admit: 2017-12-09 | Discharge: 2017-12-09 | Disposition: A | Discharge status: Home / Self Care | Payer: Medicare Other | Source: Ambulatory Visit | Attending: Student | Admitting: Student

## 2017-12-09 DIAGNOSIS — K529 Noninfective gastroenteritis and colitis, unspecified: Secondary | ICD-10-CM | POA: Insufficient documentation

## 2017-12-09 LAB — GASTROINTESTINAL PANEL BY PCR, STOOL (REPLACES STOOL CULTURE)
ADENOVIRUS F40/41: NOT DETECTED
ASTROVIRUS: NOT DETECTED
CAMPYLOBACTER SPECIES: NOT DETECTED
CYCLOSPORA CAYETANENSIS: NOT DETECTED
Cryptosporidium: NOT DETECTED
ENTEROPATHOGENIC E COLI (EPEC): NOT DETECTED
ENTEROTOXIGENIC E COLI (ETEC): NOT DETECTED
Entamoeba histolytica: NOT DETECTED
Enteroaggregative E coli (EAEC): NOT DETECTED
Giardia lamblia: NOT DETECTED
NOROVIRUS GI/GII: NOT DETECTED
PLESIMONAS SHIGELLOIDES: NOT DETECTED
ROTAVIRUS A: NOT DETECTED
Salmonella species: NOT DETECTED
Sapovirus (I, II, IV, and V): NOT DETECTED
Shiga like toxin producing E coli (STEC): NOT DETECTED
Shigella/Enteroinvasive E coli (EIEC): NOT DETECTED
VIBRIO SPECIES: NOT DETECTED
Vibrio cholerae: NOT DETECTED
Yersinia enterocolitica: NOT DETECTED

## 2017-12-09 LAB — C DIFFICILE QUICK SCREEN W PCR REFLEX
C DIFFICILE (CDIFF) INTERP: NOT DETECTED
C Diff antigen: NEGATIVE
C Diff toxin: NEGATIVE

## 2017-12-12 LAB — CALPROTECTIN, FECAL: Calprotectin, Fecal: <16 ug/g (ref 0–120)

## 2017-12-13 ENCOUNTER — Ambulatory Visit
Admission: RE | Admit: 2017-12-13 | Discharge: 2017-12-13 | Disposition: A | Discharge status: Home / Self Care | Payer: Medicare Other | Source: Ambulatory Visit | Attending: Internal Medicine | Admitting: Internal Medicine

## 2017-12-13 DIAGNOSIS — Z1231 Encounter for screening mammogram for malignant neoplasm of breast: Secondary | ICD-10-CM | POA: Diagnosis not present

## 2017-12-15 LAB — PANCREATIC ELASTASE, FECAL: Pancreatic Elastase-1, Stool: >500 ug Elast./g (ref >200)

## 2017-12-18 ENCOUNTER — Other Ambulatory Visit: Payer: Self-pay | Admitting: Internal Medicine

## 2017-12-18 DIAGNOSIS — K7469 Other cirrhosis of liver: Secondary | ICD-10-CM

## 2017-12-24 ENCOUNTER — Ambulatory Visit
Admission: RE | Admit: 2017-12-24 | Discharge: 2017-12-24 | Disposition: A | Discharge status: Home / Self Care | Payer: Medicare Other | Source: Ambulatory Visit | Attending: Internal Medicine | Admitting: Internal Medicine

## 2017-12-24 DIAGNOSIS — K7469 Other cirrhosis of liver: Secondary | ICD-10-CM | POA: Diagnosis not present

## 2017-12-24 DIAGNOSIS — K7689 Other specified diseases of liver: Secondary | ICD-10-CM | POA: Diagnosis not present

## 2017-12-24 DIAGNOSIS — Z9049 Acquired absence of other specified parts of digestive tract: Secondary | ICD-10-CM | POA: Insufficient documentation

## 2018-02-04 ENCOUNTER — Encounter: Payer: Self-pay | Admitting: *Deleted

## 2018-02-05 ENCOUNTER — Ambulatory Visit: Payer: Medicare Other | Admitting: Anesthesiology

## 2018-02-05 ENCOUNTER — Encounter
Admission: RE | Disposition: A | Discharge status: Home / Self Care | Payer: Self-pay | Source: Home / Self Care | Attending: Internal Medicine

## 2018-02-05 ENCOUNTER — Encounter: Payer: Self-pay | Admitting: *Deleted

## 2018-02-05 ENCOUNTER — Ambulatory Visit
Admission: RE | Admit: 2018-02-05 | Discharge: 2018-02-05 | Disposition: A | Discharge status: Home / Self Care | Payer: Medicare Other | Attending: Internal Medicine | Admitting: Internal Medicine

## 2018-02-05 DIAGNOSIS — K766 Portal hypertension: Secondary | ICD-10-CM | POA: Diagnosis not present

## 2018-02-05 DIAGNOSIS — F419 Anxiety disorder, unspecified: Secondary | ICD-10-CM | POA: Diagnosis not present

## 2018-02-05 DIAGNOSIS — Z888 Allergy status to other drugs, medicaments and biological substances status: Secondary | ICD-10-CM | POA: Diagnosis not present

## 2018-02-05 DIAGNOSIS — E119 Type 2 diabetes mellitus without complications: Secondary | ICD-10-CM | POA: Insufficient documentation

## 2018-02-05 DIAGNOSIS — F329 Major depressive disorder, single episode, unspecified: Secondary | ICD-10-CM | POA: Diagnosis not present

## 2018-02-05 DIAGNOSIS — Z87891 Personal history of nicotine dependence: Secondary | ICD-10-CM | POA: Insufficient documentation

## 2018-02-05 DIAGNOSIS — I8510 Secondary esophageal varices without bleeding: Secondary | ICD-10-CM | POA: Diagnosis not present

## 2018-02-05 DIAGNOSIS — Z794 Long term (current) use of insulin: Secondary | ICD-10-CM | POA: Insufficient documentation

## 2018-02-05 DIAGNOSIS — R112 Nausea with vomiting, unspecified: Secondary | ICD-10-CM | POA: Diagnosis present

## 2018-02-05 DIAGNOSIS — Z880 Allergy status to penicillin: Secondary | ICD-10-CM | POA: Diagnosis not present

## 2018-02-05 DIAGNOSIS — K7460 Unspecified cirrhosis of liver: Secondary | ICD-10-CM | POA: Insufficient documentation

## 2018-02-05 DIAGNOSIS — I10 Essential (primary) hypertension: Secondary | ICD-10-CM | POA: Insufficient documentation

## 2018-02-05 DIAGNOSIS — Z885 Allergy status to narcotic agent status: Secondary | ICD-10-CM | POA: Insufficient documentation

## 2018-02-05 DIAGNOSIS — J449 Chronic obstructive pulmonary disease, unspecified: Secondary | ICD-10-CM | POA: Insufficient documentation

## 2018-02-05 DIAGNOSIS — K3189 Other diseases of stomach and duodenum: Secondary | ICD-10-CM | POA: Diagnosis not present

## 2018-02-05 DIAGNOSIS — G4730 Sleep apnea, unspecified: Secondary | ICD-10-CM | POA: Insufficient documentation

## 2018-02-05 DIAGNOSIS — Z79899 Other long term (current) drug therapy: Secondary | ICD-10-CM | POA: Diagnosis not present

## 2018-02-05 DIAGNOSIS — K219 Gastro-esophageal reflux disease without esophagitis: Secondary | ICD-10-CM | POA: Insufficient documentation

## 2018-02-05 HISTORY — PX: ESOPHAGOGASTRODUODENOSCOPY (EGD) WITH PROPOFOL: SHX5813

## 2018-02-05 LAB — GLUCOSE, CAPILLARY: Glucose-Capillary: 165 mg/dL — ABNORMAL HIGH (ref 70–99)

## 2018-02-05 SURGERY — ESOPHAGOGASTRODUODENOSCOPY (EGD) WITH PROPOFOL
Anesthesia: General

## 2018-02-05 MED ORDER — LIDOCAINE 2% (20 MG/ML) 5 ML SYRINGE
INTRAMUSCULAR | Status: DC | PRN
  Administered 2018-02-05: 30 mg via INTRAVENOUS

## 2018-02-05 MED ORDER — PHENYLEPHRINE HCL 10 MG/ML IJ SOLN
INTRAMUSCULAR | Status: AC
  Filled 2018-02-05: qty 1

## 2018-02-05 MED ORDER — PROPOFOL 10 MG/ML IV BOLUS
INTRAVENOUS | Status: AC
  Filled 2018-02-05: qty 20

## 2018-02-05 MED ORDER — PROPOFOL 10 MG/ML IV BOLUS
INTRAVENOUS | Status: DC | PRN
  Administered 2018-02-05: 100 mg via INTRAVENOUS

## 2018-02-05 MED ORDER — GLYCOPYRROLATE 0.2 MG/ML IJ SOLN
INTRAMUSCULAR | Status: AC
  Filled 2018-02-05: qty 1

## 2018-02-05 MED ORDER — PROPOFOL 500 MG/50ML IV EMUL
INTRAVENOUS | Status: DC | PRN
  Administered 2018-02-05: 170 ug/kg/min via INTRAVENOUS

## 2018-02-05 MED ORDER — PROPOFOL 500 MG/50ML IV EMUL
INTRAVENOUS | Status: AC
  Filled 2018-02-05: qty 50

## 2018-02-05 MED ORDER — LIDOCAINE HCL (PF) 2 % IJ SOLN
INTRAMUSCULAR | Status: AC
  Filled 2018-02-05: qty 10

## 2018-02-05 MED ORDER — SODIUM CHLORIDE 0.9 % IV SOLN
INTRAVENOUS | Status: DC
  Administered 2018-02-05: 08:00:00 via INTRAVENOUS

## 2018-02-05 MED ORDER — FENTANYL CITRATE (PF) 100 MCG/2ML IJ SOLN
INTRAMUSCULAR | Status: AC
  Filled 2018-02-05: qty 2

## 2018-02-05 MED ORDER — EPHEDRINE SULFATE 50 MG/ML IJ SOLN
INTRAMUSCULAR | Status: AC
  Filled 2018-02-05: qty 1

## 2018-02-05 MED ORDER — FENTANYL CITRATE (PF) 100 MCG/2ML IJ SOLN
INTRAMUSCULAR | Status: DC | PRN
  Administered 2018-02-05: 100 ug via INTRAVENOUS

## 2018-02-05 NOTE — Anesthesia Postprocedure Evaluation (Signed)
Anesthesia Post Note  Patient: Vanessa Oneill  Procedure(s) Performed: ESOPHAGOGASTRODUODENOSCOPY (EGD) WITH PROPOFOL (N/A )  Patient location during evaluation: Endoscopy Anesthesia Type: General Level of consciousness: awake and alert Pain management: pain level controlled Vital Signs Assessment: post-procedure vital signs reviewed and stable Respiratory status: spontaneous breathing, nonlabored ventilation, respiratory function stable and patient connected to nasal cannula oxygen Cardiovascular status: blood pressure returned to baseline and stable Postop Assessment: no apparent nausea or vomiting Anesthetic complications: no     Last Vitals:  Vitals:   02/05/18 0840 02/05/18 0847  BP:  125/74  Pulse: (!) 52 (!) 56  Resp: 17 15  Temp:    SpO2: 99% 100%    Last Pain:  Vitals:   02/05/18 0815  TempSrc: Tympanic  PainSc:                  Kamen Hanken S

## 2018-02-05 NOTE — Op Note (Signed)
[facility] Gastroenterology Patient Name: Vanessa Oneill Procedure Date: 02/05/2018 7:35 AM MRN: [id number] Account #: [id number] Date of Birth: Jul 16, 1950 Admit Type: Outpatient Age: 68 Room: [facility] ENDO ROOM 3 Gender: Female Note Status: Finalized Procedure:            Upper GI endoscopy Indications:          2nd degree variceal surveillance (following bleed and                        completed eradication) Providers:            Benay Pike. Alice Reichert MD, MD Referring MD:         Ernst Spell Medicines:            Propofol per Anesthesia Complications:        No immediate complications. Procedure:            Pre-Anesthesia Assessment:                       - The risks and benefits of the procedure and the                        sedation options and risks were discussed with the                        patient. All questions were answered and informed                        consent was obtained.                       - Patient identification and proposed procedure were                        verified prior to the procedure by the nurse. The                        procedure was verified in the procedure room.                       - ASA Grade Assessment: II - A patient with mild                        systemic disease.                       - After reviewing the risks and benefits, the patient                        was deemed in satisfactory condition to undergo the                        procedure.                       After obtaining informed consent, the endoscope was                        passed under direct vision. Throughout the procedure,                        the  patient's blood pressure, pulse, and oxygen                        saturations were monitored continuously. The Endoscope                        was introduced through the mouth, and advanced to the                        third part of duodenum. The upper GI endoscopy was   accomplished without difficulty. The patient tolerated                        the procedure well. Findings:      Two columns of non-bleeding grade I varices were found in the lower       third of the esophagus,. No stigmata of recent bleeding were evident and       no red wale signs were present. Stigmata of prior treatment were       evident. The varices appeared smaller than they were at prior exam.      Moderate portal hypertensive gastropathy was found in the entire       examined stomach.      The examined duodenum was normal.      The exam was otherwise without abnormality. Impression:           - Non-bleeding grade I esophageal varices.                       - Portal hypertensive gastropathy.                       - Normal examined duodenum.                       - The examination was otherwise normal.                       - No specimens collected. Recommendation:       - Patient has a contact number available for                        emergencies. The signs and symptoms of potential                        delayed complications were discussed with the patient.                        Return to normal activities tomorrow. Written discharge                        instructions were provided to the patient.                       - Resume previous diet.                       - Continue present medications.                       - Repeat upper endoscopy in 1 year for surveillance.                       -  Return to my office in 1 month.                       - Perform a colonoscopy at the next available                        appointment.                       - The findings and recommendations were discussed with                        the patient and their spouse. Procedure Code(s):    --- Professional ---                       [id number], Esophagogastroduodenoscopy, flexible, transoral;                        diagnostic, including collection of specimen(s) by                         brushing or washing, when performed (separate procedure) Diagnosis Code(s):    --- Professional ---                       K31.89, Other diseases of stomach and duodenum                       K76.6, Portal hypertension                       I85.00, Esophageal varices without bleeding CPT copyright 2018 American Medical Association. All rights reserved. The codes documented in this report are preliminary and upon coder review may  be revised to meet current compliance requirements. Efrain Sella MD, MD 02/05/2018 8:17:00 AM This report has been signed electronically. Number of Addenda: 0 Note Initiated On: 02/05/2018 7:35 AM      [facility]

## 2018-02-05 NOTE — Anesthesia Preprocedure Evaluation (Signed)
Anesthesia Evaluation  Patient identified by MRN, date of birth, ID band Patient awake    Reviewed: Allergy & Precautions, NPO status , Patient's Chart, lab work & pertinent test results, reviewed documented beta blocker date and time   Airway Mallampati: III  TM Distance: >3 FB     Dental  (+) Chipped   Pulmonary sleep apnea , COPD, Current Smoker,           Cardiovascular hypertension, Pt. on medications and Pt. on home beta blockers      Neuro/Psych PSYCHIATRIC DISORDERS Anxiety Depression    GI/Hepatic GERD  ,  Endo/Other  diabetes, Type 2  Renal/GU      Musculoskeletal  (+) Arthritis ,   Abdominal   Peds  Hematology   Anesthesia Other Findings Obese. Raynaud's. Smokes. EF 55-65 2y ago.  Reproductive/Obstetrics                             Anesthesia Physical Anesthesia Plan  ASA: III  Anesthesia Plan: General   Post-op Pain Management:    Induction: Intravenous  PONV Risk Score and Plan:   Airway Management Planned:   Additional Equipment:   Intra-op Plan:   Post-operative Plan:   Informed Consent: I have reviewed the patients History and Physical, chart, labs and discussed the procedure including the risks, benefits and alternatives for the proposed anesthesia with the patient or authorized representative who has indicated his/her understanding and acceptance.       Plan Discussed with: CRNA  Anesthesia Plan Comments:         Anesthesia Quick Evaluation

## 2018-02-05 NOTE — H&P (Signed)
Outpatient short stay form Pre-procedure 02/05/2018 7:59 AM Vanessa Haley K. Alice Oneill, M.D.  Primary Physician: Ramonita Lab III, M.D.  Reason for visit:  Cirrhosis, esophageal varices, portal hypertension  History of present illness: 68 year old female presents for surveillance endoscopy for history of bleeding esophageal varices last banded in November 2018.  Patient has had some intermittent nausea and had one episode of nonbloody vomiting last week.  She had one episode of dysphagia while eating a boiled egg.  Besides that, patient denies any abdominal pain, melena, syncope or presyncopal episodes.    Current Facility-Administered Medications:  .  0.9 %  sodium chloride infusion, , Intravenous, Continuous, [place], Benay Pike, MD, Last Rate: 20 mL/hr at 02/05/18 0741  Medications Prior to Admission  Medication Sig Dispense Refill Last Dose  . ALPRAZolam (XANAX) 0.25 MG tablet Take 0.25 mg by mouth at bedtime as needed for sleep.  5 Past Month at Unknown time  . amLODipine (NORVASC) 2.5 MG tablet Take 2.5 mg by mouth daily.   02/04/2018 at Unknown time  . fluticasone (FLONASE) 50 MCG/ACT nasal spray Place 2 sprays into both nostrils 2 (two) times daily.  11 Past Week at Unknown time  . HYDROcodone-acetaminophen (NORCO/VICODIN) 5-325 MG tablet Take 1 tablet by mouth every 8 (eight) hours as needed for moderate pain.    02/04/2018 at Unknown time  . insulin NPH Human (HUMULIN N,NOVOLIN N) 100 UNIT/ML injection Inject 0.1 mLs (10 Units total) into the skin 2 (two) times daily before a meal. Use 30 units in the morning, 20 units during the day and 30 units at night. 10 mL 11 Past Week at Unknown time  . insulin regular (HUMULIN R) 100 units/mL injection Inject 0.05 mLs (5 Units total) into the skin 3 (three) times daily. (Patient taking differently: Inject 30 Units into the skin 3 (three) times daily. ) 10 mL 11 Past Week at Unknown time  . metFORMIN (GLUCOPHAGE-XR) 500 MG 24 hr tablet Take 1,000 mg by mouth  every evening.  4 02/04/2018 at Unknown time  . nadolol (CORGARD) 80 MG tablet Take 80 mg by mouth daily.   02/04/2018 at Unknown time  . pantoprazole (PROTONIX) 40 MG tablet Take 40 mg by mouth 2 (two) times daily.    02/04/2018 at Unknown time  . pregabalin (LYRICA) 50 MG capsule Take 50 mg by mouth 2 (two) times daily.   02/04/2018 at Unknown time  . rOPINIRole (REQUIP) 1 MG tablet Take 1 mg by mouth at bedtime.    02/04/2018 at Unknown time  . traZODone (DESYREL) 50 MG tablet Take 50 mg by mouth at bedtime.   0 02/04/2018 at Unknown time  . azelastine (ASTELIN) 0.1 % nasal spray Place into both nostrils 2 (two) times daily. Use in each nostril as directed   Not Taking at Unknown time  . cyclobenzaprine (FLEXERIL) 10 MG tablet Take 10 mg by mouth 3 (three) times daily as needed for muscle spasms.   Not Taking at Unknown time  . gabapentin (NEURONTIN) 100 MG capsule Take 100 mg by mouth 2 (two) times daily.   Not Taking at Unknown time  . glimepiride (AMARYL) 4 MG tablet Take 4 mg by mouth daily with breakfast.   Not Taking at Unknown time  . hydrochlorothiazide (HYDRODIURIL) 25 MG tablet Take 25 mg by mouth daily.  0 Not Taking at Unknown time  . meloxicam (MOBIC) 15 MG tablet Take 15 mg by mouth daily as needed for pain.   Not Taking at Unknown time  Allergies  Allergen Reactions  . Codeine   . Penicillins   . Tylenol With Codeine #3 [Acetaminophen-Codeine]   . Amoxicillin Itching and Rash  . Lipitor [Atorvastatin] Other (See Comments)    Muscle Pain  . Lisinopril Cough     Past Medical History:  Diagnosis Date  . Acid reflux   . Anxiety   . Arthritis   . Cerebral aneurysm   . COPD (chronic obstructive pulmonary disease) ([place])   . Depression   . Diabetes mellitus without complication ([place])   . Fibrocystic breast disease   . Hyperlipidemia   . Hypertension   . Liver disease   . Mitral valve regurgitation   . 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 Personal history of tobacco use,  presenting hazards to health 11/12/2014  . Raynaud's disease   . Sleep apnea     Review of systems:  Otherwise negative.    Physical Exam  Gen: Alert, oriented. Appears stated age.  HEENT: [place]/AT. PERRLA. Lungs: CTA, no wheezes. CV: RR nl S1, S2. Abd: soft, benign, no masses. BS+ Ext: No edema. Pulses 2+    Planned procedures: Proceed with EGD. The patient understands the nature of the planned procedure, indications, risks, alternatives and potential complications including but not limited to bleeding, infection, perforation, damage to internal organs and possible oversedation/side effects from anesthesia. The patient agrees and gives consent to proceed.  Please refer to procedure notes for findings, recommendations and patient disposition/instructions.     Vanessa Oneill, M.D. Gastroenterology 02/05/2018  7:59 AM

## 2018-02-05 NOTE — Anesthesia Post-op Follow-up Note (Signed)
Anesthesia QCDR form completed.        

## 2018-02-05 NOTE — Interval H&P Note (Signed)
History and Physical Interval Note:  02/05/2018 8:01 AM  Vanessa Oneill  has presented today for surgery, with the diagnosis of CIRRHOSIS IDA  The various methods of treatment have been discussed with the patient and family. After consideration of risks, benefits and other options for treatment, the patient has consented to  Procedure(s): ESOPHAGOGASTRODUODENOSCOPY (EGD) WITH PROPOFOL (N/A) as a surgical intervention .  The patient's history has been reviewed, patient examined, no change in status, stable for surgery.  I have reviewed the patient's chart and labs.  Questions were answered to the patient's satisfaction.     [place], Yaak

## 2018-02-05 NOTE — Transfer of Care (Signed)
Immediate Anesthesia Transfer of Care Note  Patient: Vanessa Oneill  Procedure(s) Performed: ESOPHAGOGASTRODUODENOSCOPY (EGD) WITH PROPOFOL (N/A )  Patient Location: PACU and Endoscopy Unit  Anesthesia Type:General  Level of Consciousness: awake and patient cooperative  Airway & Oxygen Therapy: Patient Spontanous Breathing and Patient connected to nasal cannula oxygen  Post-op Assessment: Report given to RN and Post -op Vital signs reviewed and stable  Post vital signs: Reviewed and stable  Last Vitals:  Vitals Value Taken Time  BP    Temp 36.2 C 02/05/2018  8:15 AM  Pulse 53 02/05/2018  8:15 AM  Resp 13 02/05/2018  8:15 AM  SpO2 96 % 02/05/2018  8:15 AM    Last Pain:  Vitals:   02/05/18 0815  TempSrc: Tympanic  PainSc:          Complications: No apparent anesthesia complications

## 2018-02-06 ENCOUNTER — Encounter: Payer: Self-pay | Admitting: Internal Medicine

## 2018-02-20 ENCOUNTER — Telehealth: Payer: Self-pay | Admitting: *Deleted

## 2018-02-20 DIAGNOSIS — Z122 Encounter for screening for malignant neoplasm of respiratory organs: Secondary | ICD-10-CM

## 2018-02-20 DIAGNOSIS — Z87891 Personal history of nicotine dependence: Secondary | ICD-10-CM

## 2018-02-20 NOTE — Telephone Encounter (Signed)
Patient has been notified that annual lung cancer screening low dose CT scan is due currently or will be in near future. Confirmed that patient is within the age range of 55-77, and asymptomatic, (no signs or symptoms of lung cancer). Patient denies illness that would prevent curative treatment for lung cancer if found. Verified smoking history, (current, 63 pack year). The shared decision making visit was done 11/13/14. Patient is agreeable for CT scan being scheduled.

## 2018-02-27 ENCOUNTER — Encounter: Payer: Self-pay | Admitting: *Deleted

## 2018-03-04 ENCOUNTER — Ambulatory Visit
Admission: RE | Admit: 2018-03-04 | Discharge: 2018-03-04 | Disposition: A | Discharge status: Home / Self Care | Payer: Medicare Other | Source: Ambulatory Visit | Attending: Nurse Practitioner | Admitting: Nurse Practitioner

## 2018-03-04 DIAGNOSIS — Z87891 Personal history of nicotine dependence: Secondary | ICD-10-CM | POA: Diagnosis present

## 2018-03-04 DIAGNOSIS — Z122 Encounter for screening for malignant neoplasm of respiratory organs: Secondary | ICD-10-CM | POA: Diagnosis present

## 2018-03-05 ENCOUNTER — Encounter: Payer: Self-pay | Admitting: *Deleted

## 2018-03-20 ENCOUNTER — Encounter: Payer: Self-pay | Admitting: *Deleted

## 2018-05-07 ENCOUNTER — Ambulatory Visit: Admit: 2018-05-07 | Payer: Medicare Other | Admitting: Internal Medicine

## 2018-05-07 SURGERY — COLONOSCOPY WITH PROPOFOL
Anesthesia: General

## 2018-06-24 ENCOUNTER — Other Ambulatory Visit: Payer: Self-pay | Admitting: Internal Medicine

## 2018-06-24 DIAGNOSIS — K7460 Unspecified cirrhosis of liver: Secondary | ICD-10-CM

## 2018-06-28 ENCOUNTER — Other Ambulatory Visit: Payer: Medicare Other

## 2018-07-05 ENCOUNTER — Other Ambulatory Visit: Payer: Medicare Other

## 2018-07-05 ENCOUNTER — Other Ambulatory Visit: Admission: RE | Admit: 2018-07-05 | Payer: Medicare Other | Source: Ambulatory Visit

## 2018-07-10 ENCOUNTER — Ambulatory Visit: Admit: 2018-07-10 | Payer: Medicare Other | Admitting: Internal Medicine

## 2018-07-10 SURGERY — COLONOSCOPY WITH PROPOFOL
Anesthesia: General

## 2018-08-31 ENCOUNTER — Encounter: Payer: Self-pay | Admitting: Emergency Medicine

## 2018-08-31 ENCOUNTER — Other Ambulatory Visit: Payer: Self-pay

## 2018-08-31 ENCOUNTER — Ambulatory Visit
Admission: EM | Admit: 2018-08-31 | Discharge: 2018-08-31 | Disposition: A | Discharge status: Home / Self Care | Payer: Medicare Other | Attending: Urgent Care | Admitting: Urgent Care

## 2018-08-31 DIAGNOSIS — Z20822 Contact with and (suspected) exposure to covid-19: Secondary | ICD-10-CM

## 2018-08-31 DIAGNOSIS — Z20828 Contact with and (suspected) exposure to other viral communicable diseases: Secondary | ICD-10-CM | POA: Insufficient documentation

## 2018-08-31 NOTE — ED Provider Notes (Signed)
[place], [place]   Name: Vanessa Oneill DOB: 01/17/1950 MRN: [id number] CSN: [id number] PCP: Adin Hector, MD  Arrival date and time:  08/31/18 0802  Chief Complaint:  COVID Test   NOTE: Prior to seeing the patient today, I have reviewed the triage nursing documentation and vital signs. Clinical staff has updated patient's PMH/PSHx, current medication list, and drug allergies/intolerances to ensure comprehensive history available to assist in medical decision making.   History:   HPI: Vanessa Oneill is a 68 y.o. female who presents today with complaints of recent exposure to SARS-CoV-2 (novel coronavirus). Known exposure reported to have occurred sometime over the course of the last week. Patient advises that the person she was exposed to was a co-worker at [employer], which is a Environmental consultant in [place], [place]. Co-worker tested (+) for the virus yesterday, which prompted management to require everyone in the store to be tested out of precaution. Patient presents today with no symptoms; no cough, fevers, or other symptoms commonly associated with SARS-CoV-2. She advises that she feels generally well. Patient presents for testing out of concern for her personal health. She adds that she is is being required to provide documentation of negative test results before she will be allowed to return to work.   Past Medical History:  Diagnosis Date  . Acid reflux   . Anxiety   . Arthritis   . Cerebral aneurysm   . COPD (chronic obstructive pulmonary disease) ([place])   . Depression   . Diabetes mellitus without complication ([place])   . Fibrocystic breast disease   . Hyperlipidemia   . Hypertension   . Liver disease   . Mitral valve regurgitation   . 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 Personal history of tobacco use, presenting hazards to health 11/12/2014  . Raynaud's disease   . Sleep apnea     Past Surgical History:  Procedure Laterality Date  . ABDOMINAL HYSTERECTOMY     . APPENDECTOMY    . BREAST BIOPSY Right    neg- core  . CHOLECYSTECTOMY    . ESOPHAGOGASTRODUODENOSCOPY (EGD) WITH PROPOFOL N/A 07/24/2016   Procedure: ESOPHAGOGASTRODUODENOSCOPY (EGD) WITH PROPOFOL;  Surgeon: Jonathon Bellows, MD;  Location: [facility] ENDOSCOPY;  Service: Endoscopy;  Laterality: N/A;  . ESOPHAGOGASTRODUODENOSCOPY (EGD) WITH PROPOFOL N/A 11/21/2016   Procedure: ESOPHAGOGASTRODUODENOSCOPY (EGD) WITH PROPOFOL;  Surgeon: Tol[place], Benay Pike, MD;  Location: [facility] ENDOSCOPY;  Service: Gastroenterology;  Laterality: N/A;  . ESOPHAGOGASTRODUODENOSCOPY (EGD) WITH PROPOFOL N/A 02/05/2018   Procedure: ESOPHAGOGASTRODUODENOSCOPY (EGD) WITH PROPOFOL;  Surgeon: Toledo, Benay Pike, MD;  Location: [facility] ENDOSCOPY;  Service: Endoscopy;  Laterality: N/A;  . TONSILLECTOMY      Family History  Problem Relation Age of Onset  . Lung cancer Father   . Heart Problems Father   . Heart failure Mother   . Breast cancer Neg Hx     Social History   Tobacco Use  . Smoking status: Current Every Day Smoker    Packs/day: 1.00    Years: 48.00    Pack years: 48.00    Types: Cigarettes  . Smokeless tobacco: Never Used  . Tobacco comment: patient refused  Substance Use Topics  . Alcohol use: No    Alcohol/week: 0.0 standard drinks  . Drug use: No    Patient Active Problem List   Diagnosis Date Noted  . HTN (hypertension) 08/10/2017  . HLD (hyperlipidemia) 08/10/2017  . Diabetes ([place]) 08/10/2017  . COPD (chronic obstructive pulmonary disease) ([place]) 08/10/2017  .  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 UTI (urinary tract infection) 02/06/2017  . Fever 02/06/2017  . Septic shock ([place]) 10/18/2016  . Pyelonephritis   . Respiratory distress   . Sepsis ([place]) 10/17/2016  . GIB (gastrointestinal bleeding) 07/22/2016  . Personal history of tobacco use, presenting hazards to health 11/12/2014    Home Medications:    No outpatient medications have been marked as taking for the 08/31/18 encounter  [facility] Encounter).    Allergies:   Codeine, Penicillins, Tylenol with codeine #3 [acetaminophen-codeine], Amoxicillin, Lipitor [atorvastatin], and Lisinopril  Review of Systems (ROS): Review of Systems  Constitutional: Negative for fatigue and fever.  HENT: Negative for congestion, ear pain, postnasal drip, rhinorrhea, sinus pressure, sinus pain, sneezing and sore throat.   Eyes: Negative for pain, discharge and redness.  Respiratory: Negative for cough, chest tightness and shortness of breath.   Cardiovascular: Negative for chest pain and palpitations.  Gastrointestinal: Negative for abdominal pain, diarrhea, nausea and vomiting.  Musculoskeletal: Negative for arthralgias, back pain, myalgias and neck pain.  Skin: Negative for color change, pallor and rash.  Neurological: Negative for dizziness, syncope, weakness and headaches.  Hematological: Negative for adenopathy.     Vital Signs: Today's Vitals   08/31/18 0812 08/31/18 0816  BP:  118/73  Pulse:  (!) 55  Resp:  16  Temp:  97.8 F (36.6 C)  TempSrc:  Oral  SpO2:  97%  Weight: 182 lb (82.6 kg)   Height: 5\' 7"  (1.702 m)   PainSc: 0-No pain     Physical Exam: Physical Exam  Constitutional: She is oriented to person, place, and time and well-developed, well-nourished, and in no distress. No distress.  HENT:  Head: Normocephalic and atraumatic.  Nose: Nose normal.  Mouth/Throat: Oropharynx is clear and moist.  Eyes: Pupils are equal, round, and reactive to light. Conjunctivae and EOM are normal.  Neck: Normal range of motion. Neck supple. No tracheal deviation present.  Cardiovascular: Normal rate, regular rhythm, normal heart sounds and intact distal pulses. Exam reveals no gallop and no friction rub.  No murmur heard. Pulmonary/Chest: Effort normal and breath sounds normal. No respiratory distress. She has no wheezes. She has no rales.  Abdominal: Soft. Bowel sounds are normal. She exhibits no distension. There is  no abdominal tenderness.  Musculoskeletal: Normal range of motion.  Neurological: She is alert and oriented to person, place, and time. Gait normal.  Skin: Skin is warm and dry. No rash noted. She is not diaphoretic.  Psychiatric: Mood, memory, affect and judgment normal.  Nursing note and vitals reviewed.   Urgent Care Treatments / Results:   LABS: PLEASE NOTE: all labs that were ordered this encounter are listed, however only abnormal results are displayed. Labs Reviewed  NOVEL CORONAVIRUS, NAA (HOSP ORDER, SEND-OUT TO REF LAB; TAT 18-24 HRS)    EKG: -None  RADIOLOGY: No results found.  PROCEDURES: Procedures  MEDICATIONS RECEIVED THIS VISIT: Medications - No data to display  PERTINENT CLINICAL COURSE NOTES/UPDATES:   Initial Impression / Assessment and Plan / Urgent Care Course:  Pertinent labs & imaging results that were available during my care of the patient were personally reviewed by me and considered in my medical decision making (see lab/imaging section of note for values and interpretations).  Vanessa Oneill is a 68 y.o. female who presents to [facility] today with complaints of COVID Test   Patient overall well appearing and in no acute distress today in clinic. Presenting symptoms (see HPI) and exam  as documented above. She presents following a direct exposure to SARS-CoV-2 (novel coronavirus). Discussed typical symptom constellation. Reviewed potential for infection with recent close contact. Given exposure and potential for infection, testing is reasonable. Patient collected SARS-CoV-2 swab via facility approved self collection process today under the supervision of certified clinical staff. Discussed variable turn around times associated with testing, as swabs are being processed at [facility], and have been taking as long as 7 days. She was advised to self quarantine, per [facility] DHHS guidelines, until negative results received.   Current clinical  condition warrants patient being out of work in order to recover from her current injury/illness. She was provided with the appropriate documentation to provide to her place of employment that will allow for her to RTW on 09/02/2018 with no restrictions. RTW is contingent on her SARS-CoV-2 test results being reviewed as negative.    Discussed follow up with primary care physician should she develop any concerning symptoms. I have reviewed the follow up and strict return precautions for any new or worsening symptoms. Patient is aware of symptoms that would be deemed urgent/emergent, and would thus require further evaluation either here or in the emergency department. At the time of discharge, he verbalized understanding and consent with the discharge plan as it was reviewed with him. All questions were fielded by provider and/or clinic staff prior to patient discharge.     Final Clinical Impressions / Urgent Care Diagnoses:   Final diagnoses:  Exposure to Covid-19 Virus    New Prescriptions:  [place] Controlled Substance Registry consulted? Not Applicable  No orders of the defined types were placed in this encounter.   Recommended Follow up Care:  Patient encouraged to follow up with the following provider within the specified time frame, or sooner as dictated by the severity of her symptoms. As always, she was instructed that for any urgent/emergent care needs, she should seek care either here or in the emergency department for more immediate evaluation.  Follow-up Information    Adin Hector, MD.   Specialty: Internal Medicine Why: As needed Contact information: [place] [id number] [phone]         NOTE: This note was prepared using Dragon dictation software along with smaller phrase technology. Despite my best ability to proofread, there is the potential that transcriptional errors may still occur from this process, and are completely  unintentional.    Karen Kitchens, NP 08/31/18 [phone]

## 2018-08-31 NOTE — Discharge Instructions (Signed)
It was very nice seeing you today in clinic. Thank you for entrusting me with your care.  ° °You have been tested for SARS-CoV-2 (novel coronavirus) today. Testing results have been taking between 3 and 7 days. Please self quarantine, per [place] DHHS guidelines, until you have received negative test results.  ° °If you develop any symptoms or concerns, make arrangements to follow up with your regular doctor. If your symptoms are severe, please seek follow up care in the ER. Please remember, our [place] providers are "right here with you" when you need us.  ° °Again, it was my pleasure to take care of you today. Thank you for choosing our clinic. I hope that you start to feel better quickly.  ° °Hartman Minahan, MSN, APRN, FNP-C, CEN °Advanced Practice Provider °[facility] °

## 2018-08-31 NOTE — ED Triage Notes (Signed)
Patient states that her employer is requiring her to be tested for the COVID-19.  Patient states that an employee who she has not been around tested positive yesterday.  Patient denies any symptoms. Patient denies fevers.

## 2018-09-01 LAB — NOVEL CORONAVIRUS, NAA (HOSP ORDER, SEND-OUT TO REF LAB; TAT 18-24 HRS): SARS-CoV-2, NAA: NOT DETECTED

## 2018-09-01 IMAGING — CT CT CHEST LUNG CANCER SCREENING LOW DOSE W/O CM
2 of 5 series · 15 of 40 positions shown, 18 images · non-contrast
Comparison: Low-dose lung cancer screening CT chest dated
02/22/2016

CLINICAL DATA: 66-year-old female current smoker, with 62 pack-year
history of smoking, for follow-up lung cancer screening

EXAM:
CT CHEST WITHOUT CONTRAST LOW-DOSE FOR LUNG CANCER SCREENING
TECHNIQUE: Multidetector CT imaging of the chest was performed following the
standard protocol without IV contrast.

[Series 3: lung · axial · 0.61mm/px · z∈[-1470,-1241]mm · 12 of 255 slices shown, 15 images (1 of 2)]
[im 13/255  mediastinal]
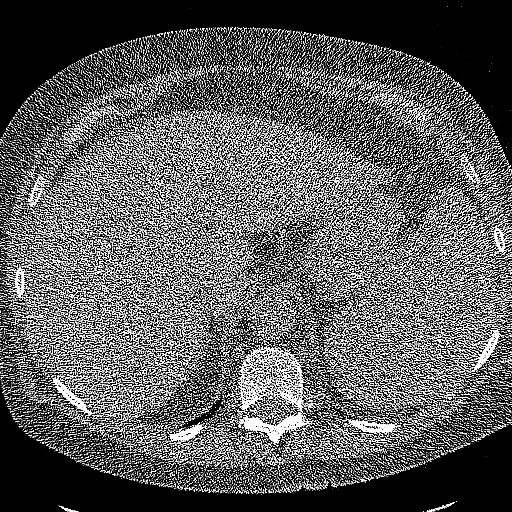
[im 13/255  lung]
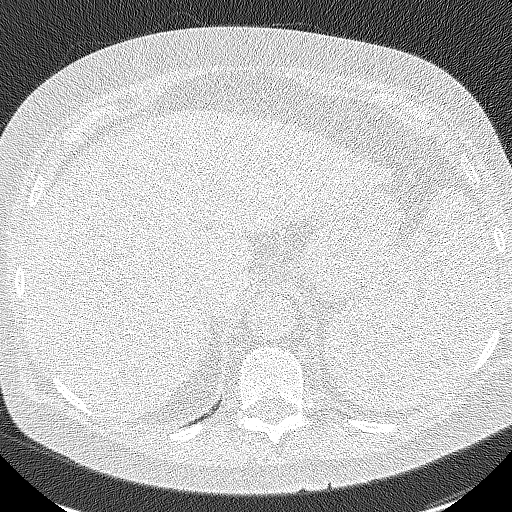
[im 39/255  lung]
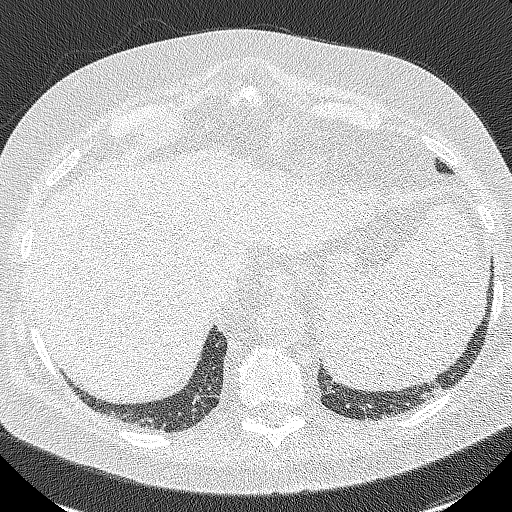
[im 51/255  lung]
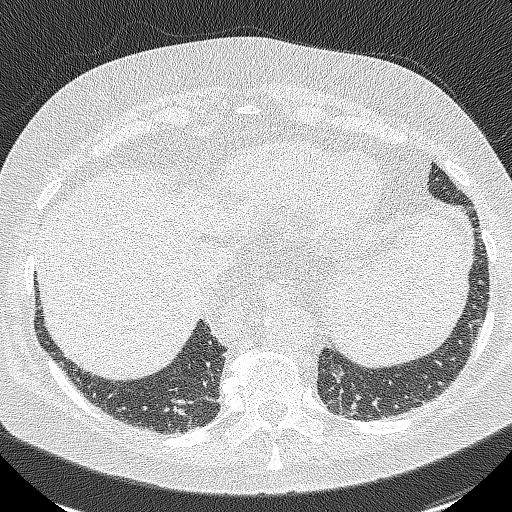
[im 77/255  lung]
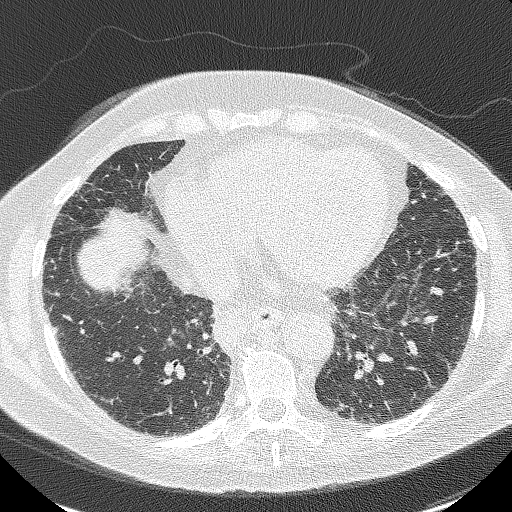
[im 102/255  mediastinal]
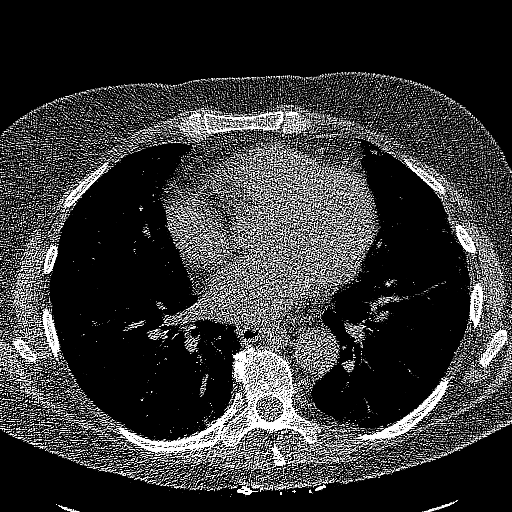
[im 102/255  lung]
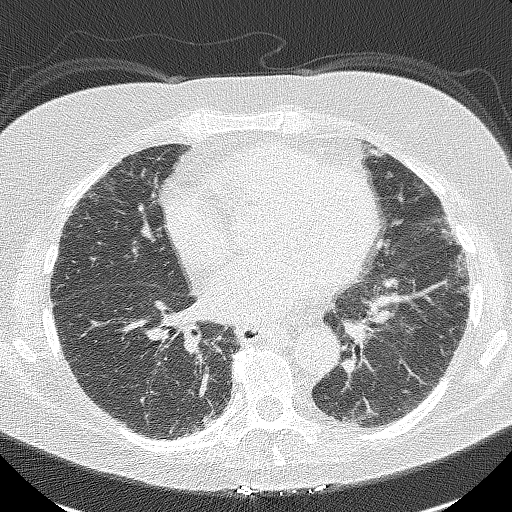
[im 115/255  lung]
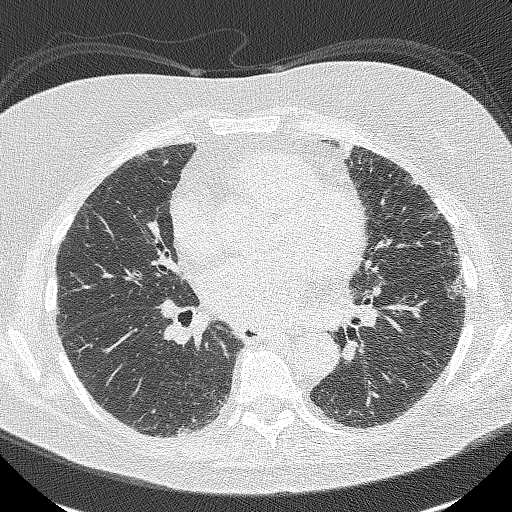
[im 140/255  lung]
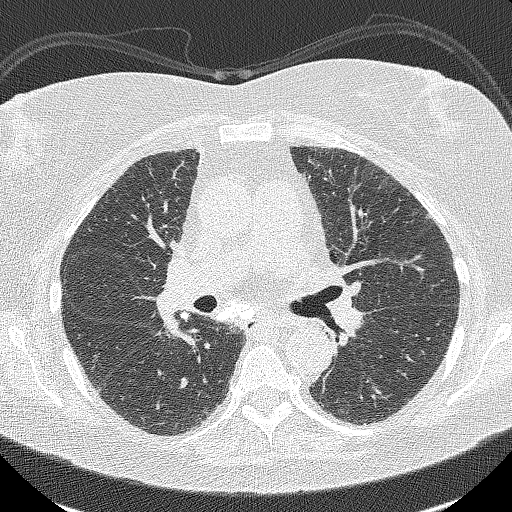
[im 153/255  lung]
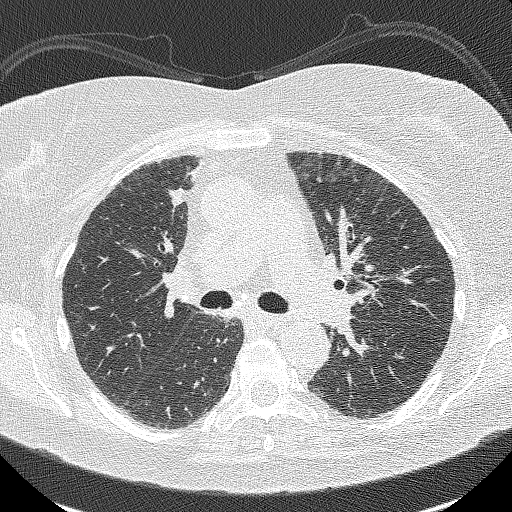
[im 178/255  mediastinal]
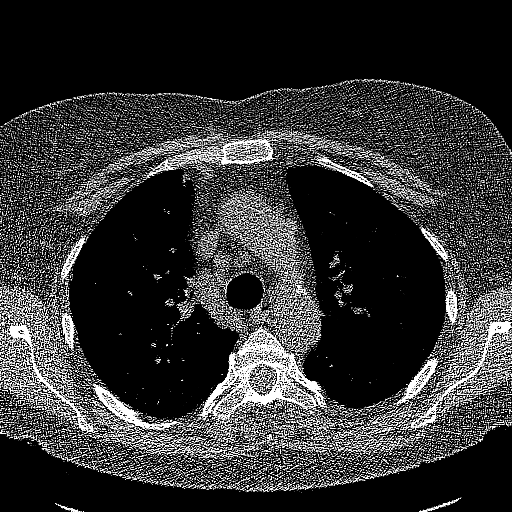
[im 178/255  lung]
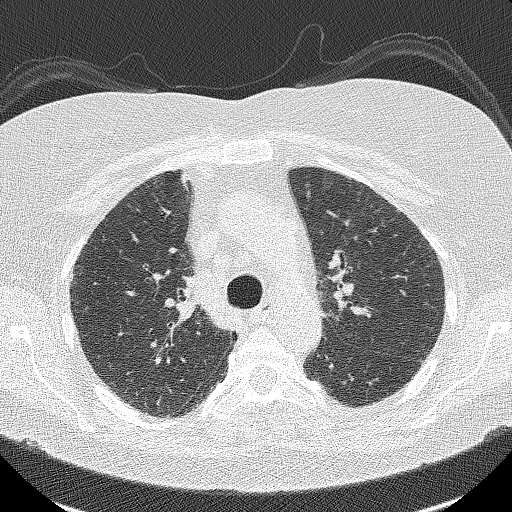
[im 204/255  lung]
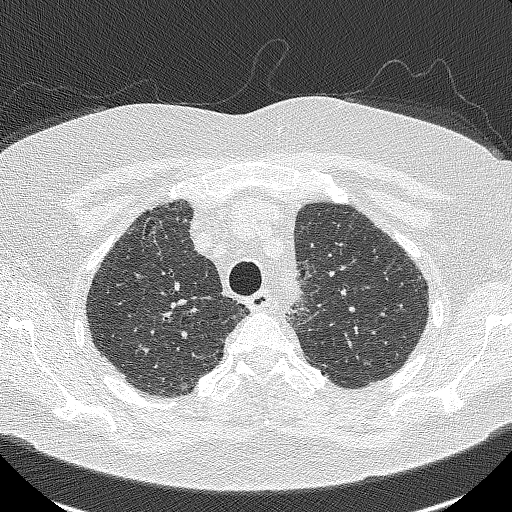
[im 216/255  lung]
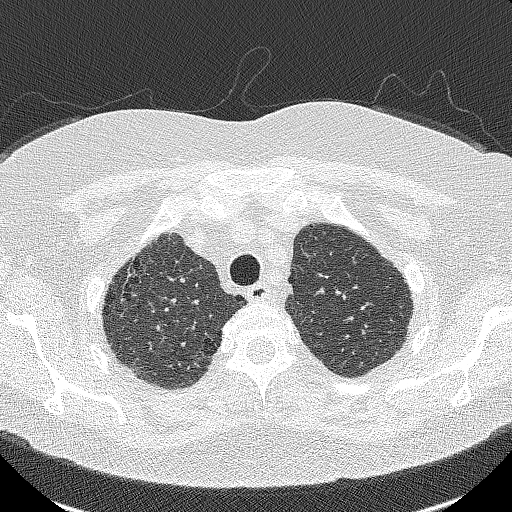
[im 242/255  lung]
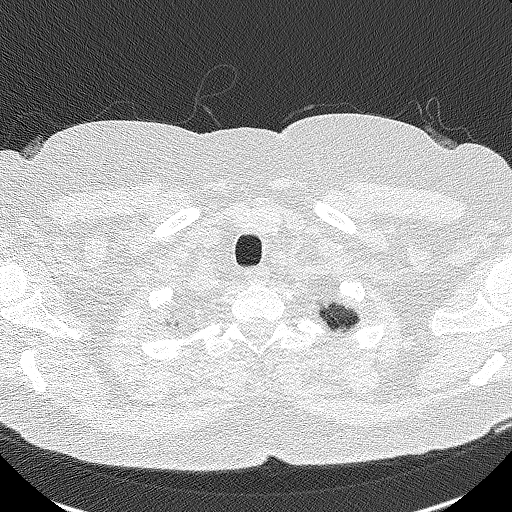

[Series 4: lung · coronal · 0.50mm/px · 3 of 245 slices shown (2 of 2)]
[im 49/245  lung]
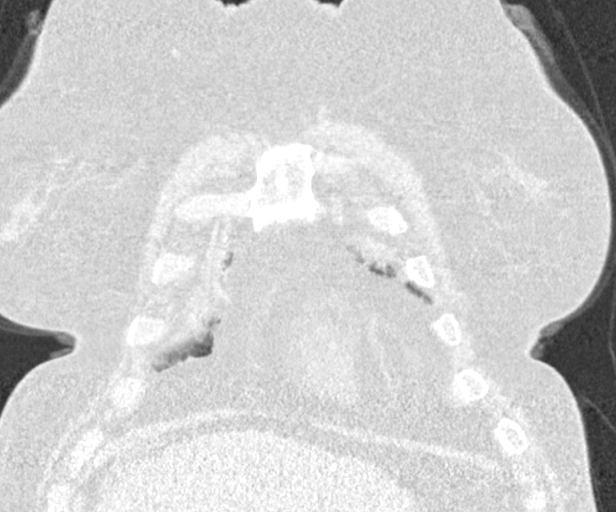
[im 98/245  lung]
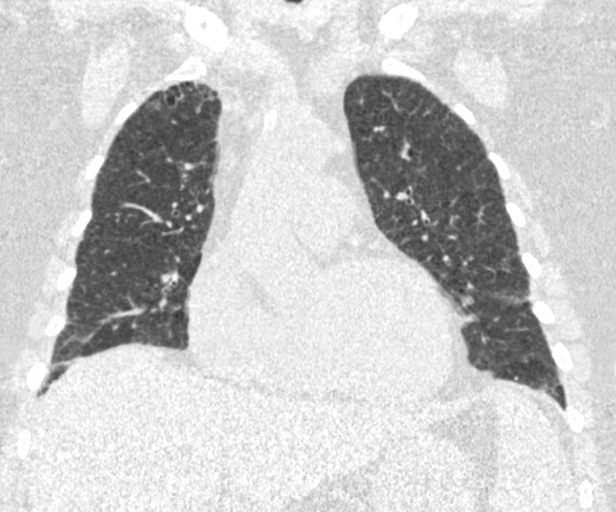
[im 147/245  lung]
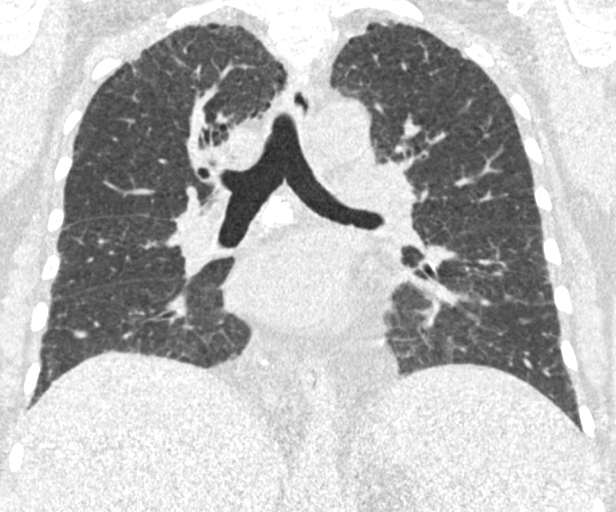

[15 of 40 positions shown; findings below may reference images not displayed]

FINDINGS: Cardiovascular: The heart is top-normal in size. No pericardial
effusion.

No evidence of thoracic aortic aneurysm. Mild atherosclerotic
calcifications of the aortic arch.

Mild coronary atherosclerosis of the LAD and right coronary artery.

Mediastinum/Nodes: Calcified mediastinal and right perihilar nodes.
Additional small right lower paratracheal and AP window nodes
measuring up to 13 mm short axis, likely reactive.

Visualized thyroid is grossly unremarkable.

Lungs/Pleura: Biapical pleural-parenchymal scarring.

Mild centrilobular and paraseptal emphysematous changes, upper lobe
predominant.

Superimposed subpleural reticulation/fibrosis, lower lobe
predominant, suggesting superimposed chronic initial lung disease.

No focal consolidation. Mild scarring in the medial right upper
lobe.

Scattered small bilateral pulmonary nodules measuring up to 3.8 mm.

No pleural effusion or pneumothorax.

Upper Abdomen: Visualized upper abdomen is notable for a mildly
nodular hepatic contour and possible splenomegaly, although
incompletely visualized.

Musculoskeletal: Mild degenerative changes of the visualized
thoracolumbar spine.
IMPRESSION: Lung-RADS 2, benign appearance or behavior. Continue annual
screening with low-dose chest CT without contrast in 12 months.

Aortic Atherosclerosis (QC7VG-BBM.M) and Emphysema (QC7VG-R10.O).

## 2018-10-22 ENCOUNTER — Ambulatory Visit: Payer: Medicare Other

## 2018-10-23 ENCOUNTER — Ambulatory Visit
Admission: RE | Admit: 2018-10-23 | Discharge: 2018-10-23 | Disposition: A | Discharge status: Home / Self Care | Payer: Medicare Other | Source: Ambulatory Visit | Attending: Internal Medicine | Admitting: Internal Medicine

## 2018-10-23 ENCOUNTER — Other Ambulatory Visit: Payer: Self-pay

## 2018-10-23 DIAGNOSIS — K7460 Unspecified cirrhosis of liver: Secondary | ICD-10-CM | POA: Diagnosis not present

## 2018-11-14 ENCOUNTER — Other Ambulatory Visit
Admission: RE | Admit: 2018-11-14 | Discharge: 2018-11-14 | Disposition: A | Discharge status: Home / Self Care | Payer: Medicare Other | Source: Ambulatory Visit | Attending: Internal Medicine | Admitting: Internal Medicine

## 2018-11-14 ENCOUNTER — Other Ambulatory Visit: Payer: Self-pay

## 2018-11-14 ENCOUNTER — Other Ambulatory Visit: Payer: Self-pay | Admitting: Internal Medicine

## 2018-11-14 DIAGNOSIS — Z01812 Encounter for preprocedural laboratory examination: Secondary | ICD-10-CM | POA: Diagnosis present

## 2018-11-14 DIAGNOSIS — Z20828 Contact with and (suspected) exposure to other viral communicable diseases: Secondary | ICD-10-CM | POA: Insufficient documentation

## 2018-11-14 DIAGNOSIS — Z1231 Encounter for screening mammogram for malignant neoplasm of breast: Secondary | ICD-10-CM

## 2018-11-14 LAB — SARS CORONAVIRUS 2 (TAT 6-24 HRS): SARS Coronavirus 2: NEGATIVE

## 2018-11-18 ENCOUNTER — Encounter
Admission: RE | Disposition: A | Discharge status: Home / Self Care | Payer: Self-pay | Source: Home / Self Care | Attending: Internal Medicine

## 2018-11-18 ENCOUNTER — Ambulatory Visit
Admission: RE | Admit: 2018-11-18 | Discharge: 2018-11-18 | Disposition: A | Discharge status: Home / Self Care | Payer: Medicare Other | Attending: Internal Medicine | Admitting: Internal Medicine

## 2018-11-18 ENCOUNTER — Ambulatory Visit: Payer: Medicare Other | Admitting: Certified Registered Nurse Anesthetist

## 2018-11-18 ENCOUNTER — Other Ambulatory Visit: Payer: Self-pay

## 2018-11-18 ENCOUNTER — Encounter: Payer: Self-pay | Admitting: *Deleted

## 2018-11-18 DIAGNOSIS — I10 Essential (primary) hypertension: Secondary | ICD-10-CM | POA: Insufficient documentation

## 2018-11-18 DIAGNOSIS — E785 Hyperlipidemia, unspecified: Secondary | ICD-10-CM | POA: Diagnosis not present

## 2018-11-18 DIAGNOSIS — F419 Anxiety disorder, unspecified: Secondary | ICD-10-CM | POA: Insufficient documentation

## 2018-11-18 DIAGNOSIS — M1990 Unspecified osteoarthritis, unspecified site: Secondary | ICD-10-CM | POA: Diagnosis not present

## 2018-11-18 DIAGNOSIS — K591 Functional diarrhea: Secondary | ICD-10-CM | POA: Diagnosis present

## 2018-11-18 DIAGNOSIS — K219 Gastro-esophageal reflux disease without esophagitis: Secondary | ICD-10-CM | POA: Insufficient documentation

## 2018-11-18 DIAGNOSIS — K621 Rectal polyp: Secondary | ICD-10-CM | POA: Insufficient documentation

## 2018-11-18 DIAGNOSIS — Z79899 Other long term (current) drug therapy: Secondary | ICD-10-CM | POA: Insufficient documentation

## 2018-11-18 DIAGNOSIS — J449 Chronic obstructive pulmonary disease, unspecified: Secondary | ICD-10-CM | POA: Diagnosis not present

## 2018-11-18 DIAGNOSIS — K640 First degree hemorrhoids: Secondary | ICD-10-CM | POA: Diagnosis not present

## 2018-11-18 DIAGNOSIS — Z794 Long term (current) use of insulin: Secondary | ICD-10-CM | POA: Insufficient documentation

## 2018-11-18 DIAGNOSIS — K635 Polyp of colon: Secondary | ICD-10-CM | POA: Insufficient documentation

## 2018-11-18 DIAGNOSIS — I2510 Atherosclerotic heart disease of native coronary artery without angina pectoris: Secondary | ICD-10-CM | POA: Diagnosis not present

## 2018-11-18 DIAGNOSIS — G4730 Sleep apnea, unspecified: Secondary | ICD-10-CM | POA: Diagnosis not present

## 2018-11-18 DIAGNOSIS — F329 Major depressive disorder, single episode, unspecified: Secondary | ICD-10-CM | POA: Diagnosis not present

## 2018-11-18 DIAGNOSIS — E119 Type 2 diabetes mellitus without complications: Secondary | ICD-10-CM | POA: Diagnosis not present

## 2018-11-18 HISTORY — DX: Atherosclerosis of aorta: I70.0

## 2018-11-18 HISTORY — DX: Atherosclerotic heart disease of native coronary artery without angina pectoris: I25.10

## 2018-11-18 HISTORY — DX: Benign neoplasm of colon, unspecified: D12.6

## 2018-11-18 HISTORY — DX: Bacterial infection, unspecified: A49.9

## 2018-11-18 HISTORY — PX: COLONOSCOPY WITH PROPOFOL: SHX5780

## 2018-11-18 HISTORY — DX: Headache, unspecified: R51.9

## 2018-11-18 HISTORY — DX: Low back pain, unspecified: M54.50

## 2018-11-18 HISTORY — DX: Bacterial infection, unspecified: Z16.12

## 2018-11-18 LAB — GLUCOSE, CAPILLARY: Glucose-Capillary: 204 mg/dL — ABNORMAL HIGH (ref 70–99)

## 2018-11-18 SURGERY — COLONOSCOPY WITH PROPOFOL
Anesthesia: General

## 2018-11-18 MED ORDER — PROPOFOL 10 MG/ML IV BOLUS
INTRAVENOUS | Status: AC
  Filled 2018-11-18: qty 20

## 2018-11-18 MED ORDER — GLYCOPYRROLATE 0.2 MG/ML IJ SOLN
INTRAMUSCULAR | Status: DC | PRN
  Administered 2018-11-18: 0.2 mg via INTRAVENOUS

## 2018-11-18 MED ORDER — PROPOFOL 10 MG/ML IV BOLUS
INTRAVENOUS | Status: DC | PRN
  Administered 2018-11-18: 70 mg via INTRAVENOUS

## 2018-11-18 MED ORDER — PROPOFOL 500 MG/50ML IV EMUL
INTRAVENOUS | Status: DC | PRN
  Administered 2018-11-18: 130 ug/kg/min via INTRAVENOUS

## 2018-11-18 MED ORDER — LIDOCAINE HCL (CARDIAC) PF 100 MG/5ML IV SOSY
PREFILLED_SYRINGE | INTRAVENOUS | Status: DC | PRN
  Administered 2018-11-18: 50 mg via INTRAVENOUS

## 2018-11-18 MED ORDER — GLYCOPYRROLATE 0.2 MG/ML IJ SOLN
INTRAMUSCULAR | Status: AC
  Filled 2018-11-18: qty 1

## 2018-11-18 MED ORDER — LIDOCAINE HCL (PF) 2 % IJ SOLN
INTRAMUSCULAR | Status: AC
  Filled 2018-11-18: qty 10

## 2018-11-18 MED ORDER — SODIUM CHLORIDE 0.9 % IV SOLN
INTRAVENOUS | Status: DC
  Administered 2018-11-18: 08:00:00 via INTRAVENOUS

## 2018-11-18 MED ORDER — PROPOFOL 500 MG/50ML IV EMUL
INTRAVENOUS | Status: AC
  Filled 2018-11-18: qty 50

## 2018-11-18 NOTE — Interval H&P Note (Signed)
History and Physical Interval Note:  11/18/2018 8:25 AM  Vanessa Oneill  has presented today for surgery, with the diagnosis of DIARRHEA,PERSONAL HX.OF COLON POLYPS.  The various methods of treatment have been discussed with the patient and family. After consideration of risks, benefits and other options for treatment, the patient has consented to  Procedure(s): COLONOSCOPY WITH PROPOFOL (N/A) as a surgical intervention.  The patient's history has been reviewed, patient examined, no change in status, stable for surgery.  I have reviewed the patient's chart and labs.  Questions were answered to the patient's satisfaction.     [place], [place]

## 2018-11-18 NOTE — Anesthesia Postprocedure Evaluation (Signed)
Anesthesia Post Note  Patient: Vanessa Oneill  Procedure(s) Performed: COLONOSCOPY WITH PROPOFOL (N/A )  Patient location during evaluation: Endoscopy Anesthesia Type: General Level of consciousness: awake and alert Pain management: pain level controlled Vital Signs Assessment: post-procedure vital signs reviewed and stable Respiratory status: spontaneous breathing, nonlabored ventilation, respiratory function stable and patient connected to nasal cannula oxygen Cardiovascular status: blood pressure returned to baseline and stable Postop Assessment: no apparent nausea or vomiting Anesthetic complications: no     Last Vitals:  Vitals:   11/18/18 0927 11/18/18 0937  BP: (!) 99/58 116/66  Pulse: 61 60  Resp: 13 (!) 29  Temp:    SpO2: 98% 98%    Last Pain:  Vitals:   11/18/18 0937  TempSrc:   PainSc: 0-No pain                 Martha Clan

## 2018-11-18 NOTE — H&P (Signed)
Outpatient short stay form Pre-procedure 11/18/2018 8:23 AM Andrue Dini K. Alice Reichert, M.D.  Primary Physician: Ramonita Lab, M.D.  Reason for visit: Personal hx of colon polyps, Functional diarrhea  History of present illness: 68 y/o female presents for functional diarrhea for several months without gross blood. Has hx of adenomatous colon polyps in 2016.     Current Facility-Administered Medications:  .  0.9 %  sodium chloride infusion, , Intravenous, Continuous, [place], Benay Pike, MD, Last Rate: 20 mL/hr at 11/18/18 0745  Medications Prior to Admission  Medication Sig Dispense Refill Last Dose  . albuterol (VENTOLIN HFA) 108 (90 Base) MCG/ACT inhaler Inhale into the lungs every 6 (six) hours as needed for wheezing or shortness of breath.     . ALPRAZolam (XANAX) 0.25 MG tablet Take 0.25 mg by mouth at bedtime as needed for sleep.  5 Past Month at Unknown time  . amLODipine (NORVASC) 2.5 MG tablet Take 2.5 mg by mouth daily.   11/18/2018 at Unknown time  . colestipol (COLESTID) 1 g tablet Take 1 g by mouth 2 (two) times daily.     Marland Kitchen glimepiride (AMARYL) 4 MG tablet Take 4 mg by mouth daily with breakfast.   11/17/2018 at Unknown time  . insulin NPH Human (HUMULIN N,NOVOLIN N) 100 UNIT/ML injection Inject 0.1 mLs (10 Units total) into the skin 2 (two) times daily before a meal. Use 30 units in the morning, 20 units during the day and 30 units at night. 10 mL 11 11/17/2018 at Unknown time  . pantoprazole (PROTONIX) 40 MG tablet Take 40 mg by mouth 2 (two) times daily.    11/17/2018 at Unknown time  . traZODone (DESYREL) 50 MG tablet Take 50 mg by mouth at bedtime.   0 11/17/2018 at Unknown time  . azelastine (ASTELIN) 0.1 % nasal spray Place into both nostrils 2 (two) times daily. Use in each nostril as directed     . cyclobenzaprine (FLEXERIL) 10 MG tablet Take 10 mg by mouth 3 (three) times daily as needed for muscle spasms.     . fluticasone (FLONASE) 50 MCG/ACT nasal spray Place 2 sprays into  both nostrils 2 (two) times daily.  11   . gabapentin (NEURONTIN) 100 MG capsule Take 100 mg by mouth 2 (two) times daily.     . hydrochlorothiazide (HYDRODIURIL) 25 MG tablet Take 25 mg by mouth daily.  0 Not Taking at Unknown time  . HYDROcodone-acetaminophen (NORCO/VICODIN) 5-325 MG tablet Take 1 tablet by mouth every 8 (eight) hours as needed for moderate pain.      Marland Kitchen insulin regular (HUMULIN R) 100 units/mL injection Inject 0.05 mLs (5 Units total) into the skin 3 (three) times daily. (Patient taking differently: Inject 30 Units into the skin 3 (three) times daily. ) 10 mL 11   . meloxicam (MOBIC) 15 MG tablet Take 15 mg by mouth daily as needed for pain.   Not Taking at Unknown time  . metFORMIN (GLUCOPHAGE-XR) 500 MG 24 hr tablet Take 1,000 mg by mouth every evening.  4 Completed Course at Unknown time  . nadolol (CORGARD) 80 MG tablet Take 80 mg by mouth daily.     . pregabalin (LYRICA) 50 MG capsule Take 50 mg by mouth 2 (two) times daily.     Marland Kitchen rOPINIRole (REQUIP) 1 MG tablet Take 1 mg by mouth at bedtime.         Allergies  Allergen Reactions  . Codeine   . Penicillins   . Tylenol With Codeine #  3 [Acetaminophen-Codeine]   . Amoxicillin Itching and Rash  . Lipitor [Atorvastatin] Other (See Comments)    Muscle Pain  . Lisinopril Cough     Past Medical History:  Diagnosis Date  . Acid reflux   . Anxiety   . Aortic atherosclerosis ([place])   . Arthritis   . Cerebral aneurysm   . Colon adenomas   . COPD (chronic obstructive pulmonary disease) ([place])   . Coronary artery disease   . Depression   . Diabetes mellitus without complication ([place])   . ESBL (extended spectrum beta-lactamase) producing bacteria infection   . Fibrocystic breast disease   . Headache   . Hyperlipidemia   . Hypertension   . Liver disease   . Lumbago   . Mitral valve regurgitation   . 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 Personal history of tobacco use, presenting hazards to health 11/12/2014  .  Raynaud's disease   . Sleep apnea     Review of systems:  Otherwise negative.    Physical Exam  Gen: Alert, oriented. Appears stated age.  HEENT: [place]/AT. PERRLA. Lungs: CTA, no wheezes. CV: RR nl S1, S2. Abd: soft, benign, no masses. BS+ Ext: No edema. Pulses 2+    Planned procedures: Proceed with colonoscopy. The patient understands the nature of the planned procedure, indications, risks, alternatives and potential complications including but not limited to bleeding, infection, perforation, damage to internal organs and possible oversedation/side effects from anesthesia. The patient agrees and gives consent to proceed.  Please refer to procedure notes for findings, recommendations and patient disposition/instructions.     Laiya Wisby K. Alice Reichert, M.D. Gastroenterology 11/18/2018  8:23 AM

## 2018-11-18 NOTE — Op Note (Signed)
[facility] Gastroenterology Patient Name: Vanessa Oneill Procedure Date: 11/18/2018 8:13 AM MRN: [id number] Account #: [id number] Date of Birth: 1950-07-15 Admit Type: Outpatient Age: 68 Room: [facility] ENDO ROOM 1 Gender: Female Note Status: Finalized Procedure:             Colonoscopy Indications:           Functional diarrhea Providers:             Benay Pike. Toledo MD, MD Medicines:             Propofol per Anesthesia Complications:         No immediate complications. Procedure:             Pre-Anesthesia Assessment:                        - The risks and benefits of the procedure and the                         sedation options and risks were discussed with the                         patient. All questions were answered and informed                         consent was obtained.                        - Patient identification and proposed procedure were                         verified prior to the procedure by the nurse. The                         procedure was verified in the procedure room.                        - After reviewing the risks and benefits, the patient                         was deemed in satisfactory condition to undergo the                         procedure.                        - ASA Grade Assessment: III - A patient with severe                         systemic disease.                        After obtaining informed consent, the colonoscope was                         passed under direct vision. Throughout the procedure,                         the patient's blood pressure, pulse, and oxygen  saturations were monitored continuously. The                         Colonoscope was introduced through the anus and                         advanced to the the terminal ileum, with                         identification of the appendiceal orifice and IC                         valve. The colonoscopy was somewhat difficult due  to                         restricted mobility of the colon. Successful                         completion of the procedure was aided by applying                         abdominal pressure. The patient tolerated the                         procedure well. The quality of the bowel preparation                         was good. The terminal ileum, ileocecal valve,                         appendiceal orifice, and rectum were photographed. Findings:      The perianal and digital rectal examinations were normal. Pertinent       negatives include normal sphincter tone and no palpable rectal lesions.      Normal mucosa was found in the entire colon. Biopsies for histology were       taken with a cold forceps from the random colon for evaluation of       microscopic colitis.      Three sessile polyps were found in the recto-sigmoid colon. The polyps       were 3 to 4 mm in size. These polyps were removed with a jumbo cold       forceps. Resection and retrieval were complete.      Three sessile polyps were found in the sigmoid colon. The polyps were 5       to 8 mm in size. These polyps were removed with a hot snare. Resection       and retrieval were complete.      Eight sessile polyps were found in the rectum. The polyps were 4 to 6 mm       in size. These polyps were removed with a hot snare. Resection and       retrieval were complete.      A 6 mm polyp was found in the recto-sigmoid colon. The polyp was       sessile. The polyp was removed with a hot snare. Resection and retrieval       were complete.      Non-bleeding internal hemorrhoids were found during retroflexion. The       hemorrhoids were Grade  I (internal hemorrhoids that do not prolapse).      The exam was otherwise without abnormality. Impression:            - Normal mucosa in the entire examined colon. Biopsied.                        - Three 3 to 4 mm polyps at the recto-sigmoid colon,                         removed with a  jumbo cold forceps. Resected and                         retrieved.                        - Three 5 to 8 mm polyps in the sigmoid colon, removed                         with a hot snare. Resected and retrieved.                        - Eight 4 to 6 mm polyps in the rectum, removed with a                         hot snare. Resected and retrieved.                        - One 6 mm polyp at the recto-sigmoid colon, removed                         with a hot snare. Resected and retrieved.                        - Non-bleeding internal hemorrhoids.                        - The examination was otherwise normal. Recommendation:        - Patient has a contact number available for                         emergencies. The signs and symptoms of potential                         delayed complications were discussed with the patient.                         Return to normal activities tomorrow. Written                         discharge instructions were provided to the patient.                        - Resume previous diet.                        - Continue present medications.                        - Repeat  colonoscopy is recommended for surveillance.                         The colonoscopy date will be determined after                         pathology results from today's exam become available                         for review.                        - Return to GI office PRN. Procedure Code(s):     --- Professional ---                        580 120 3129, Colonoscopy, flexible; with removal of                         tumor(s), polyp(s), or other lesion(s) by snare                         technique                        45380, 59, Colonoscopy, flexible; with biopsy, single                         or multiple Diagnosis Code(s):     --- Professional ---                        K59.1, Functional diarrhea                        K63.5, Polyp of colon                        K62.1, Rectal polyp                         K64.0, First degree hemorrhoids CPT copyright 2019 American Medical Association. All rights reserved. The codes documented in this report are preliminary and upon coder review may  be revised to meet current compliance requirements. Efrain Sella MD, MD 11/18/2018 9:18:59 AM This report has been signed electronically. Number of Addenda: 0 Note Initiated On: 11/18/2018 8:13 AM Scope Withdrawal Time: 0 hours 34 minutes 47 seconds  Total Procedure Duration: 0 hours 40 minutes 46 seconds  Estimated Blood Loss:  Estimated blood loss: none.      [facility]

## 2018-11-18 NOTE — Anesthesia Preprocedure Evaluation (Signed)
Anesthesia Evaluation  Patient identified by MRN, date of birth, ID band Patient awake    Reviewed: Allergy & Precautions, NPO status , Patient's Chart, lab work & pertinent test results, reviewed documented beta blocker date and time   History of Anesthesia Complications Negative for: history of anesthetic complications  Airway Mallampati: III  TM Distance: >3 FB     Dental  (+) Chipped, Dental Advidsory Given   Pulmonary neg shortness of breath, sleep apnea , COPD, Current Smoker and Patient abstained from smoking.,           Cardiovascular hypertension, Pt. on medications and Pt. on home beta blockers (-) angina+ CAD  (-) Past MI (-) dysrhythmias + Valvular Problems/Murmurs      Neuro/Psych PSYCHIATRIC DISORDERS Anxiety Depression negative neurological ROS     GI/Hepatic Neg liver ROS, GERD  ,  Endo/Other  diabetes, Type 2  Renal/GU negative Renal ROS     Musculoskeletal  (+) Arthritis ,   Abdominal   Peds  Hematology   Anesthesia Other Findings Past Medical History: No date: Acid reflux No date: Anxiety No date: Aortic atherosclerosis (HCC) No date: Arthritis No date: Cerebral aneurysm No date: Colon adenomas No date: COPD (chronic obstructive pulmonary disease) (HCC) No date: Coronary artery disease No date: Depression No date: Diabetes mellitus without complication (HCC) No date: ESBL (extended spectrum beta-lactamase) producing bacteria  infection No date: Fibrocystic breast disease No date: Headache No date: Hyperlipidemia No date: Hypertension No date: Liver disease No date: Lumbago No date: Mitral valve regurgitation No date: 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[place]) [id number]: Personal history of tobacco use, presenting hazards to  health No date: Raynaud's disease No date: Sleep apnea   Reproductive/Obstetrics                             Anesthesia Physical  Anesthesia  Plan  ASA: III  Anesthesia Plan: General   Post-op Pain Management:    Induction: Intravenous  PONV Risk Score and Plan: 2 and Propofol infusion and TIVA  Airway Management Planned: Natural Airway and Nasal Cannula  Additional Equipment:   Intra-op Plan:   Post-operative Plan:   Informed Consent: I have reviewed the patients History and Physical, chart, labs and discussed the procedure including the risks, benefits and alternatives for the proposed anesthesia with the patient or authorized representative who has indicated his/her understanding and acceptance.       Plan Discussed with: CRNA  Anesthesia Plan Comments:         Anesthesia Quick Evaluation

## 2018-11-18 NOTE — Transfer of Care (Signed)
Immediate Anesthesia Transfer of Care Note  Patient: Vanessa Oneill  Procedure(s) Performed: COLONOSCOPY WITH PROPOFOL (N/A )  Patient Location: PACU and Endoscopy Unit  Anesthesia Type:General  Level of Consciousness: awake, alert  and patient cooperative  Airway & Oxygen Therapy: Patient Spontanous Breathing  Post-op Assessment: Report given to RN and Post -op Vital signs reviewed and stable  Post vital signs: Reviewed and stable  Last Vitals:  Vitals Value Taken Time  BP 145/54 11/18/18 0917  Temp    Pulse 60 11/18/18 0918  Resp 22 11/18/18 0918  SpO2 96 % 11/18/18 0918  Vitals shown include unvalidated device data.  Last Pain:  Vitals:   11/18/18 0703  TempSrc: Temporal         Complications: No apparent anesthesia complications

## 2018-11-18 NOTE — Anesthesia Post-op Follow-up Note (Signed)
Anesthesia QCDR form completed.        

## 2018-11-19 LAB — SURGICAL PATHOLOGY

## 2018-12-17 ENCOUNTER — Ambulatory Visit
Admission: RE | Admit: 2018-12-17 | Discharge: 2018-12-17 | Disposition: A | Discharge status: Home / Self Care | Payer: Medicare Other | Source: Ambulatory Visit | Attending: Internal Medicine | Admitting: Internal Medicine

## 2018-12-17 DIAGNOSIS — Z1231 Encounter for screening mammogram for malignant neoplasm of breast: Secondary | ICD-10-CM | POA: Diagnosis not present

## 2019-03-06 ENCOUNTER — Telehealth: Payer: Self-pay | Admitting: *Deleted

## 2019-03-06 DIAGNOSIS — Z87891 Personal history of nicotine dependence: Secondary | ICD-10-CM

## 2019-03-06 NOTE — Telephone Encounter (Addendum)
(  03/06/19) Pt called back, and has been notified that lung cancer screening CT scan is due currently or will be in near future. Confirmed pt is within appropriate age range, and asymptomatic. Pt denies illness that would prevent curative treatment for lung cancer if found. Verified smoking history (Current Smoker,1 ppd ). Pt is agreeable for CT scan being scheduled, prefers an appt after 9 am on Mon./Tues./Wed.  SRW

## 2019-03-12 NOTE — Telephone Encounter (Signed)
Smoking history: current, 64 pack year

## 2019-03-12 NOTE — Addendum Note (Signed)
Addended by: Lieutenant Diego on: 03/12/2019 11:47 AM   Modules accepted: Orders

## 2019-03-18 ENCOUNTER — Telehealth: Payer: Self-pay | Admitting: *Deleted

## 2019-03-18 NOTE — Telephone Encounter (Signed)
Patient reports that she is getting 1st covid vaccine tomorrow. We will reschedule her lung screening scan approximately 4 weeks after her 2nd vaccine. She is in agreement with this plan.

## 2019-03-24 ENCOUNTER — Ambulatory Visit: Payer: Medicare Other

## 2019-05-01 ENCOUNTER — Telehealth: Payer: Self-pay

## 2019-05-01 DIAGNOSIS — Z122 Encounter for screening for malignant neoplasm of respiratory organs: Secondary | ICD-10-CM

## 2019-05-01 DIAGNOSIS — Z87891 Personal history of nicotine dependence: Secondary | ICD-10-CM

## 2019-05-01 NOTE — Telephone Encounter (Signed)
Patient has been notified that the low dose lung cancer screening CT scan is due currently or will be in near future.  Confirmed that patient is within the appropriate age range and asymptomatic, (no signs or symptoms of lung cancer).  Patient denies illness that would prevent curative treatment for lung cancer if found.  Patient is agreeable for CT scan being scheduled.    Verified smoking history current smoker, with 49 year 1 ppd history).   Received 2nd COVID vaccine on 04/09/19.  CT scan scheduled for 05/12/2019 @ 11:00.

## 2019-05-02 NOTE — Addendum Note (Signed)
Addended by: Lieutenant Diego on: 05/02/2019 03:29 PM   Modules accepted: Orders

## 2019-05-02 NOTE — Telephone Encounter (Signed)
Smoking history: current, 64 pack year

## 2019-05-09 NOTE — Telephone Encounter (Signed)
Message left informing patient of low dose lung cancer screening CT scan appointment on 05/12/19 @ 11:00.

## 2019-05-12 ENCOUNTER — Ambulatory Visit: Admission: RE | Admit: 2019-05-12 | Payer: Medicare Other | Source: Ambulatory Visit

## 2019-05-13 ENCOUNTER — Other Ambulatory Visit: Payer: Self-pay

## 2019-05-13 ENCOUNTER — Ambulatory Visit
Admission: RE | Admit: 2019-05-13 | Discharge: 2019-05-13 | Disposition: A | Discharge status: Home / Self Care | Payer: Medicare Other | Source: Ambulatory Visit | Attending: Oncology | Admitting: Oncology

## 2019-05-13 DIAGNOSIS — Z87891 Personal history of nicotine dependence: Secondary | ICD-10-CM | POA: Diagnosis present

## 2019-05-13 DIAGNOSIS — Z122 Encounter for screening for malignant neoplasm of respiratory organs: Secondary | ICD-10-CM | POA: Diagnosis present

## 2019-05-15 ENCOUNTER — Encounter: Payer: Self-pay | Admitting: *Deleted

## 2019-08-22 ENCOUNTER — Other Ambulatory Visit: Payer: Self-pay

## 2019-08-22 ENCOUNTER — Ambulatory Visit
Admission: RE | Admit: 2019-08-22 | Discharge: 2019-08-22 | Disposition: A | Discharge status: Home / Self Care | Payer: Medicare Other | Source: Ambulatory Visit | Attending: Physician Assistant | Admitting: Physician Assistant

## 2019-08-22 ENCOUNTER — Other Ambulatory Visit: Payer: Self-pay | Admitting: Physician Assistant

## 2019-08-22 DIAGNOSIS — N63 Unspecified lump in unspecified breast: Secondary | ICD-10-CM

## 2019-08-22 DIAGNOSIS — N6321 Unspecified lump in the left breast, upper outer quadrant: Secondary | ICD-10-CM | POA: Diagnosis not present

## 2019-08-22 DIAGNOSIS — N6322 Unspecified lump in the left breast, upper inner quadrant: Secondary | ICD-10-CM | POA: Insufficient documentation

## 2019-08-29 ENCOUNTER — Other Ambulatory Visit: Payer: Medicare Other

## 2019-12-18 ENCOUNTER — Other Ambulatory Visit: Payer: Self-pay | Admitting: Internal Medicine

## 2019-12-18 DIAGNOSIS — K7460 Unspecified cirrhosis of liver: Secondary | ICD-10-CM

## 2020-01-01 ENCOUNTER — Other Ambulatory Visit: Payer: Self-pay

## 2020-01-01 ENCOUNTER — Ambulatory Visit
Admission: RE | Admit: 2020-01-01 | Discharge: 2020-01-01 | Disposition: A | Discharge status: Home / Self Care | Payer: Medicare Other | Source: Ambulatory Visit | Attending: Internal Medicine | Admitting: Internal Medicine

## 2020-01-01 DIAGNOSIS — K7460 Unspecified cirrhosis of liver: Secondary | ICD-10-CM | POA: Insufficient documentation

## 2020-01-23 ENCOUNTER — Ambulatory Visit (INDEPENDENT_AMBULATORY_CARE_PROVIDER_SITE_OTHER): Payer: Medicare Other

## 2020-01-23 ENCOUNTER — Other Ambulatory Visit: Payer: Self-pay

## 2020-01-23 ENCOUNTER — Encounter: Payer: Self-pay | Admitting: Emergency Medicine

## 2020-01-23 ENCOUNTER — Ambulatory Visit
Admission: EM | Admit: 2020-01-23 | Discharge: 2020-01-23 | Disposition: A | Discharge status: Home / Self Care | Payer: Medicare Other | Attending: Family Medicine | Admitting: Family Medicine

## 2020-01-23 DIAGNOSIS — Z880 Allergy status to penicillin: Secondary | ICD-10-CM | POA: Insufficient documentation

## 2020-01-23 DIAGNOSIS — R059 Cough, unspecified: Secondary | ICD-10-CM | POA: Insufficient documentation

## 2020-01-23 DIAGNOSIS — Z794 Long term (current) use of insulin: Secondary | ICD-10-CM | POA: Diagnosis not present

## 2020-01-23 DIAGNOSIS — Z886 Allergy status to analgesic agent status: Secondary | ICD-10-CM | POA: Diagnosis not present

## 2020-01-23 DIAGNOSIS — Z7984 Long term (current) use of oral hypoglycemic drugs: Secondary | ICD-10-CM | POA: Insufficient documentation

## 2020-01-23 DIAGNOSIS — R0602 Shortness of breath: Secondary | ICD-10-CM

## 2020-01-23 DIAGNOSIS — Z79899 Other long term (current) drug therapy: Secondary | ICD-10-CM | POA: Diagnosis not present

## 2020-01-23 DIAGNOSIS — Z885 Allergy status to narcotic agent status: Secondary | ICD-10-CM | POA: Diagnosis not present

## 2020-01-23 DIAGNOSIS — Z20822 Contact with and (suspected) exposure to covid-19: Secondary | ICD-10-CM | POA: Diagnosis not present

## 2020-01-23 DIAGNOSIS — R531 Weakness: Secondary | ICD-10-CM | POA: Diagnosis not present

## 2020-01-23 DIAGNOSIS — J189 Pneumonia, unspecified organism: Secondary | ICD-10-CM | POA: Insufficient documentation

## 2020-01-23 DIAGNOSIS — Z791 Long term (current) use of non-steroidal anti-inflammatories (NSAID): Secondary | ICD-10-CM | POA: Diagnosis not present

## 2020-01-23 DIAGNOSIS — Z888 Allergy status to other drugs, medicaments and biological substances status: Secondary | ICD-10-CM | POA: Insufficient documentation

## 2020-01-23 DIAGNOSIS — F17210 Nicotine dependence, cigarettes, uncomplicated: Secondary | ICD-10-CM | POA: Insufficient documentation

## 2020-01-23 LAB — CBC WITH DIFFERENTIAL/PLATELET
Abs Immature Granulocytes: 0.03 10*3/uL (ref 0.00–0.07)
Basophils Absolute: 0.1 10*3/uL (ref 0.0–0.1)
Basophils Relative: 1 %
Eosinophils Absolute: 0.1 10*3/uL (ref 0.0–0.5)
Eosinophils Relative: 1 %
HCT: 36.9 % (ref 36.0–46.0)
Hemoglobin: 12.6 g/dL (ref 12.0–15.0)
Immature Granulocytes: 0 %
Lymphocytes Relative: 14 %
Lymphs Abs: 1.2 10*3/uL (ref 0.7–4.0)
MCH: 30.4 pg (ref 26.0–34.0)
MCHC: 34.1 g/dL (ref 30.0–36.0)
MCV: 89.1 fL (ref 80.0–100.0)
Monocytes Absolute: 0.8 10*3/uL (ref 0.1–1.0)
Monocytes Relative: 10 %
Neutro Abs: 5.9 10*3/uL (ref 1.7–7.7)
Neutrophils Relative %: 74 %
Platelets: 93 10*3/uL — ABNORMAL LOW (ref 150–400)
RBC: 4.14 MIL/uL (ref 3.87–5.11)
RDW: 15 % (ref 11.5–15.5)
WBC: 8 10*3/uL (ref 4.0–10.5)
nRBC: 0 % (ref 0.0–0.2)

## 2020-01-23 LAB — COMPREHENSIVE METABOLIC PANEL
ALT: 32 U/L (ref 0–44)
AST: 25 U/L (ref 15–41)
Albumin: 3.8 g/dL (ref 3.5–5.0)
Alkaline Phosphatase: 99 U/L (ref 38–126)
Anion gap: 10 (ref 5–15)
BUN: 17 mg/dL (ref 8–23)
CO2: 24 mmol/L (ref 22–32)
Calcium: 9 mg/dL (ref 8.9–10.3)
Chloride: 96 mmol/L — ABNORMAL LOW (ref 98–111)
Creatinine, Ser: 0.76 mg/dL (ref 0.44–1.00)
GFR, Estimated: >60 mL/min (ref >60)
Glucose, Bld: 309 mg/dL — ABNORMAL HIGH (ref 70–99)
Potassium: 4.3 mmol/L (ref 3.5–5.1)
Sodium: 130 mmol/L — ABNORMAL LOW (ref 135–145)
Total Bilirubin: 1.4 mg/dL — ABNORMAL HIGH (ref 0.3–1.2)
Total Protein: 7.6 g/dL (ref 6.5–8.1)

## 2020-01-23 LAB — RESP PANEL BY RT-PCR (FLU A&B, COVID) ARPGX2
Influenza A by PCR: NEGATIVE
Influenza B by PCR: NEGATIVE
SARS Coronavirus 2 by RT PCR: NEGATIVE

## 2020-01-23 MED ORDER — PREDNISONE 50 MG PO TABS: ORAL_TABLET | ORAL | 0 refills | Status: DC

## 2020-01-23 MED ORDER — LEVOFLOXACIN 750 MG PO TABS: 750.0000 mg | ORAL_TABLET | Freq: Every day | ORAL | 0 refills | Status: DC

## 2020-01-23 NOTE — ED Provider Notes (Signed)
[facility]    CSN: [id number] Arrival date & time: 01/23/20  8841      History   Chief Complaint Chief Complaint  Patient presents with   Cough   HPI  70 year old female presents with multiple complaints.  Patient reports that her symptoms started on Tuesday.  She reports cough, shortness of breath, subjective fever, body aches.  She reports decrease in appetite.  She feels very poorly.  She states that she feels weak and fatigued.  She states that she was tested for COVID-19 on 1/19.  Testing was negative.  She continues to feel very poorly.  Pain 4/10 in severity.  No relieving factors.  No other reported symptoms.  No other complaints.  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[place])    Personal history of tobacco use, presenting hazards to health 11/12/2014   Raynaud's disease    Sleep apnea     Patient Active Problem List   Diagnosis Date Noted   HTN (hypertension) 08/10/2017   HLD (hyperlipidemia) 08/10/2017   Diabetes ([place]) 08/10/2017   COPD (chronic obstructive pulmonary disease) ([place]) 66/06/3014   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[place]) 01/10/3233   UTI (urinary tract infection) 02/06/2017   Fever 02/06/2017   Septic shock ([place]) 10/18/2016   Pyelonephritis    Respiratory distress    Sepsis ([place]) 10/17/2016   GIB (gastrointestinal bleeding) 07/22/2016   Personal history of tobacco use, presenting hazards to health 11/12/2014    Past  Surgical History:  Procedure Laterality Date   ABDOMINAL HYSTERECTOMY     APPENDECTOMY     CHOLECYSTECTOMY     COLONOSCOPY WITH PROPOFOL N/A 11/18/2018   Procedure: COLONOSCOPY WITH PROPOFOL;  Surgeon: Toledo, Benay Pike, MD;  Location: [facility] ENDOSCOPY;  Service: Gastroenterology;  Laterality: N/A;   ESOPHAGOGASTRODUODENOSCOPY (EGD) WITH PROPOFOL N/A 07/24/2016   Procedure: ESOPHAGOGASTRODUODENOSCOPY (EGD) WITH PROPOFOL;  Surgeon: Jonathon Bellows, MD;  Location: [facility] ENDOSCOPY;  Service: Endoscopy;  Laterality: N/A;   ESOPHAGOGASTRODUODENOSCOPY (EGD) WITH PROPOFOL N/A 11/21/2016   Procedure: ESOPHAGOGASTRODUODENOSCOPY (EGD) WITH PROPOFOL;  Surgeon: Toledo, Benay Pike, MD;  Location: [facility] ENDOSCOPY;  Service: Gastroenterology;  Laterality: N/A;   ESOPHAGOGASTRODUODENOSCOPY (EGD) WITH PROPOFOL N/A 02/05/2018   Procedure: ESOPHAGOGASTRODUODENOSCOPY (EGD) WITH PROPOFOL;  Surgeon: Toledo, Benay Pike, MD;  Location: [facility] ENDOSCOPY;  Service: Endoscopy;  Laterality: N/A;   TONSILLECTOMY      OB History   No obstetric history on file.      Home Medications    Prior to Admission medications   Medication Sig Start Date End Date Taking? Authorizing Provider  albuterol (VENTOLIN HFA) 108 (90 Base) MCG/ACT inhaler Inhale into the lungs every 6 (six) hours as needed for wheezing or shortness of breath.   Yes [provider]  ALPRAZolam (XANAX) 0.25 MG tablet Take 0.25 mg by mouth at bedtime as needed for sleep. 07/06/16  Yes [provider]  amLODipine (NORVASC) 2.5 MG tablet Take  2.5 mg by mouth daily.   Yes [provider]  azelastine (ASTELIN) 0.1 % nasal spray Place into both nostrils 2 (two) times daily. Use in each nostril as directed   Yes [provider]  colestipol (COLESTID) 1 g tablet Take 1 g by mouth 2 (two) times daily.   Yes [provider]  cyclobenzaprine (FLEXERIL) 10 MG tablet Take 10 mg by mouth 3 (three) times daily as needed for muscle  spasms.   Yes [provider]  fluticasone (FLONASE) 50 MCG/ACT nasal spray Place 2 sprays into both nostrils 2 (two) times daily. 06/26/17  Yes [provider]  gabapentin (NEURONTIN) 100 MG capsule Take 100 mg by mouth 2 (two) times daily.   Yes [provider]  glimepiride (AMARYL) 4 MG tablet Take 4 mg by mouth daily with breakfast.   Yes [provider]  hydrochlorothiazide (HYDRODIURIL) 25 MG tablet Take 25 mg by mouth daily. 05/11/17  Yes [provider]  HYDROcodone-acetaminophen (NORCO/VICODIN) 5-325 MG tablet Take 1 tablet by mouth every 8 (eight) hours as needed for moderate pain.    Yes [provider]  insulin NPH Human (HUMULIN N,NOVOLIN N) 100 UNIT/ML injection Inject 0.1 mLs (10 Units total) into the skin 2 (two) times daily before a meal. Use 30 units in the morning, 20 units during the day and 30 units at night. 02/08/17  Yes Wieting, Richard, MD  insulin regular (HUMULIN R) 100 units/mL injection Inject 0.05 mLs (5 Units total) into the skin 3 (three) times daily. Patient taking differently: Inject 30 Units into the skin 3 (three) times daily. 02/08/17  Yes Wieting, Richard, MD  levofloxacin (LEVAQUIN) 750 MG tablet Take 1 tablet (750 mg total) by mouth daily. 01/23/20  Yes Kemari Mares G, DO  meloxicam (MOBIC) 15 MG tablet Take 15 mg by mouth daily as needed for pain.   Yes [provider]  metFORMIN (GLUCOPHAGE-XR) 500 MG 24 hr tablet Take 1,000 mg by mouth every evening. 05/25/17  Yes [provider]  nadolol (CORGARD) 80 MG tablet Take 80 mg by mouth daily.   Yes [provider]  pantoprazole (PROTONIX) 40 MG tablet Take 40 mg by mouth 2 (two) times daily.    Yes [provider]  predniSONE (DELTASONE) 50 MG tablet 1 tablet daily x 5 days 01/23/20  Yes Malgorzata Albert G, DO  pregabalin (LYRICA) 50 MG capsule Take 50 mg by mouth 2 (two) times daily.   Yes [provider]  rOPINIRole (REQUIP) 1  MG tablet Take 1 mg by mouth at bedtime.    Yes [provider]  traZODone (DESYREL) 50 MG tablet Take 50 mg by mouth at bedtime. 06/19/16  Yes [provider]    Family History Family History  Problem Relation Age of Onset   Lung cancer Father    Heart Problems Father    Heart failure Mother    Breast cancer Neg Hx     Social History Social History   Tobacco Use   Smoking status: Current Every Day Smoker    Packs/day: 1.00    Years: 48.00    Pack years: 48.00    Types: Cigarettes   Smokeless tobacco: Never Used   Tobacco comment: patient refused  Vaping Use   Vaping Use: Never used  Substance Use Topics   Alcohol use: No    Alcohol/week: 0.0 standard drinks   Drug use: No     Allergies   Codeine, Penicillins, Tylenol with codeine #  3 [acetaminophen-codeine], Amoxicillin, Lipitor [atorvastatin], and Lisinopril   Review of Systems Review of Systems Per HPI  Physical Exam Triage Vital Signs ED Triage Vitals [01/23/20 0849]  Enc Vitals Group     BP (!) 146/77     Pulse Rate 64     Resp 20     Temp 99 F (37.2 C)     Temp Source Oral     SpO2 94 %     Weight 182 lb 15.7 oz (83 kg)     Height 5\' 6"  (1.676 m)     Head Circumference      Peak Flow      Pain Score 4     Pain Loc      Pain Edu?      Excl. in [place]?    Updated Vital Signs BP (!) 146/77 (BP Location: Left Arm)    Pulse 64    Temp 99 F (37.2 C) (Oral)    Resp 20    Ht 5\' 6"  (1.676 m)    Wt 83 kg    SpO2 94%    BMI 29.53 kg/m   Visual Acuity Right Eye Distance:   Left Eye Distance:   Bilateral Distance:    Right Eye Near:   Left Eye Near:    Bilateral Near:     Physical Exam Vitals and nursing note reviewed.  Constitutional:      General: She is not in acute distress.    Appearance: She is ill-appearing.  HENT:     Head: Normocephalic and atraumatic.  Eyes:     General:        Right eye: No discharge.        Left eye: No discharge.      Conjunctiva/sclera: Conjunctivae normal.  Cardiovascular:     Rate and Rhythm: Normal rate and regular rhythm.  Pulmonary:     Effort: Pulmonary effort is normal.     Comments: Diffuse expiratory wheezing. Abdominal:     General: There is no distension.     Palpations: Abdomen is soft.     Tenderness: There is no abdominal tenderness.  Neurological:     Mental Status: She is alert.    UC Treatments / Results  Labs (all labs ordered are listed, but only abnormal results are displayed) Labs Reviewed  CBC WITH DIFFERENTIAL/PLATELET - Abnormal; Notable for the following components:      Result Value   Platelets 93 (*)    All other components within normal limits  COMPREHENSIVE METABOLIC PANEL - Abnormal; Notable for the following components:   Sodium 130 (*)    Chloride 96 (*)    Glucose, Bld 309 (*)    Total Bilirubin 1.4 (*)    All other components within normal limits  RESP PANEL BY RT-PCR (FLU A&B, COVID) [id number]    EKG   Radiology No results found.  Procedures Procedures (including critical care time)  Medications Ordered in UC Medications - No data to display  Initial Impression / Assessment and Plan / UC Course  I have reviewed the triage vital signs and the nursing notes.  Pertinent labs & imaging results that were available during my care of the patient were reviewed by me and considered in my medical decision making (see chart for details).    70 year old female presents with the above complaints.  Laboratory studies notable for hyperglycemia.  No leukocytosis.  Wheezing on exam.  Chest x-ray obtained and intimately reviewed by me.  Bilateral basilar  infiltrates.  Placing on Levaquin.  Prednisone as directed.  Advised to monitor blood sugars regularly.  Will need more insulin at home given illness.  I advised the patient that she may be better off going to the hospital and she refuses at this time.  I have advised her that if she worsens, she needs to go directly  to the ER.  Final Clinical Impressions(s) / UC Diagnoses   Final diagnoses:  Community acquired pneumonia, unspecified laterality     Discharge Instructions     Medications as prescribed.  If you worsen, go to the ER.  Check sugar frequently. You will need more insulin due to illness.  Take care  Dr. Lacinda Axon     ED Prescriptions    Medication Sig Dispense Auth. Provider   levofloxacin (LEVAQUIN) 750 MG tablet Take 1 tablet (750 mg total) by mouth daily. 7 tablet Keithon Mccoin G, DO   predniSONE (DELTASONE) 50 MG tablet 1 tablet daily x 5 days 5 tablet Thersa Salt G, DO     PDMP not reviewed this encounter.   Coral Spikes, DO 01/23/20 1046

## 2020-01-23 NOTE — ED Triage Notes (Signed)
Pt c/o cough, chest congestion, shortness of breath, fever, body aches. Started about 4 days ago. She states she was tested for covid 2 days ago and was negative.

## 2020-01-23 NOTE — Discharge Instructions (Signed)
Medications as prescribed.  If you worsen, go to the ER.  Check sugar frequently. You will need more insulin due to illness.  Take care  Dr. Lacinda Axon

## 2020-03-01 ENCOUNTER — Other Ambulatory Visit: Payer: Self-pay | Admitting: Internal Medicine

## 2020-03-01 DIAGNOSIS — R928 Other abnormal and inconclusive findings on diagnostic imaging of breast: Secondary | ICD-10-CM

## 2020-03-18 ENCOUNTER — Other Ambulatory Visit: Payer: Self-pay | Admitting: Internal Medicine

## 2020-03-18 DIAGNOSIS — R928 Other abnormal and inconclusive findings on diagnostic imaging of breast: Secondary | ICD-10-CM

## 2020-04-06 ENCOUNTER — Ambulatory Visit
Admission: RE | Admit: 2020-04-06 | Discharge: 2020-04-06 | Disposition: A | Discharge status: Home / Self Care | Payer: Medicare Other | Source: Ambulatory Visit | Attending: Internal Medicine | Admitting: Internal Medicine

## 2020-04-06 ENCOUNTER — Other Ambulatory Visit: Payer: Self-pay

## 2020-04-06 DIAGNOSIS — R928 Other abnormal and inconclusive findings on diagnostic imaging of breast: Secondary | ICD-10-CM | POA: Diagnosis not present

## 2020-04-11 ENCOUNTER — Ambulatory Visit
Admission: EM | Admit: 2020-04-11 | Discharge: 2020-04-11 | Disposition: A | Discharge status: Home / Self Care | Payer: Medicare Other | Attending: Emergency Medicine | Admitting: Emergency Medicine

## 2020-04-11 ENCOUNTER — Ambulatory Visit (INDEPENDENT_AMBULATORY_CARE_PROVIDER_SITE_OTHER): Payer: Medicare Other

## 2020-04-11 ENCOUNTER — Other Ambulatory Visit: Payer: Self-pay

## 2020-04-11 DIAGNOSIS — R062 Wheezing: Secondary | ICD-10-CM

## 2020-04-11 DIAGNOSIS — R109 Unspecified abdominal pain: Secondary | ICD-10-CM | POA: Diagnosis present

## 2020-04-11 LAB — COMPREHENSIVE METABOLIC PANEL
ALT: 22 U/L (ref 0–44)
AST: 30 U/L (ref 15–41)
Albumin: 4.1 g/dL (ref 3.5–5.0)
Alkaline Phosphatase: 101 U/L (ref 38–126)
Anion gap: 6 (ref 5–15)
BUN: 13 mg/dL (ref 8–23)
CO2: 26 mmol/L (ref 22–32)
Calcium: 9 mg/dL (ref 8.9–10.3)
Chloride: 99 mmol/L (ref 98–111)
Creatinine, Ser: 0.73 mg/dL (ref 0.44–1.00)
GFR, Estimated: >60 mL/min (ref >60)
Glucose, Bld: 215 mg/dL — ABNORMAL HIGH (ref 70–99)
Potassium: 3.8 mmol/L (ref 3.5–5.1)
Sodium: 131 mmol/L — ABNORMAL LOW (ref 135–145)
Total Bilirubin: 0.5 mg/dL (ref 0.3–1.2)
Total Protein: 7.3 g/dL (ref 6.5–8.1)

## 2020-04-11 LAB — URINALYSIS, COMPLETE (UACMP) WITH MICROSCOPIC
Bilirubin Urine: NEGATIVE
Glucose, UA: NEGATIVE mg/dL
Hgb urine dipstick: NEGATIVE
Ketones, ur: NEGATIVE mg/dL
Leukocytes,Ua: NEGATIVE
Nitrite: NEGATIVE
Protein, ur: NEGATIVE mg/dL
Specific Gravity, Urine: 1.01 (ref 1.005–1.030)
pH: 6.5 (ref 5.0–8.0)

## 2020-04-11 LAB — CBC WITH DIFFERENTIAL/PLATELET
Abs Immature Granulocytes: 0.01 10*3/uL (ref 0.00–0.07)
Basophils Absolute: 0 10*3/uL (ref 0.0–0.1)
Basophils Relative: 1 %
Eosinophils Absolute: 0 10*3/uL (ref 0.0–0.5)
Eosinophils Relative: 1 %
HCT: 35.6 % — ABNORMAL LOW (ref 36.0–46.0)
Hemoglobin: 12 g/dL (ref 12.0–15.0)
Immature Granulocytes: 0 %
Lymphocytes Relative: 21 %
Lymphs Abs: 0.8 10*3/uL (ref 0.7–4.0)
MCH: 29.5 pg (ref 26.0–34.0)
MCHC: 33.7 g/dL (ref 30.0–36.0)
MCV: 87.5 fL (ref 80.0–100.0)
Monocytes Absolute: 0.2 10*3/uL (ref 0.1–1.0)
Monocytes Relative: 6 %
Neutro Abs: 2.6 10*3/uL (ref 1.7–7.7)
Neutrophils Relative %: 71 %
Platelets: 74 10*3/uL — ABNORMAL LOW (ref 150–400)
RBC: 4.07 MIL/uL (ref 3.87–5.11)
RDW: 14.6 % (ref 11.5–15.5)
Smear Review: NORMAL
WBC: 3.7 10*3/uL — ABNORMAL LOW (ref 4.0–10.5)
nRBC: 0 % (ref 0.0–0.2)

## 2020-04-11 NOTE — Discharge Instructions (Addendum)
Use your hydrocodone and your Flexeril that already prescribed for you to help treat your pain and any potential muscle spasm that may be causing the pain in your back.  Your blood work and urinalysis today did not reveal the presence of any infection and neither did your chest x-ray.  Keep your appointment with coming Tuesday with your primary care provider and follow-up with them if your pain continues and is not being helped by the hydrocodone and Flexeril.

## 2020-04-11 NOTE — ED Provider Notes (Signed)
[facility]    CSN: [id number] Arrival date & time: 04/11/20  1429      History   Chief Complaint Chief Complaint  Patient presents with  . Back Pain    HPI Vanessa Oneill is a 70 y.o. female.   HPI   70 year old female here for evaluation of right upper back pain.  Patient reports that her pain started yesterday suddenly and she does not remember any injuries or falls.  Denies heavy lifting.  Patient states that her pain is an 8 out of 10 and occasionally goes up to 10 out of 10.  She has had associated nausea, increasing abdominal swelling, urinary urgency and frequency.  Patient denies fever, vomiting or diarrhea, pain with urination, cloudy urine or blood in her urine.  Patient states that she has had a cough but not continuous.  Past Medical History:  Diagnosis Date  . Acid reflux   . Anxiety   . Aortic atherosclerosis ([place])   . Arthritis   . Cerebral aneurysm   . Colon adenomas   . COPD (chronic obstructive pulmonary disease) ([place])   . Coronary artery disease   . Depression   . Diabetes mellitus without complication ([place])   . ESBL (extended spectrum beta-lactamase) producing bacteria infection   . Headache   . Hyperlipidemia   . Hypertension   . Liver disease   . Lumbago   . Mitral valve regurgitation   . 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 Personal history of tobacco use, presenting hazards to health 11/12/2014  . Raynaud's disease   . Sleep apnea     Patient Active Problem List   Diagnosis Date Noted  . HTN (hypertension) 08/10/2017  . HLD (hyperlipidemia) 08/10/2017  . Diabetes ([place]) 08/10/2017  . COPD (chronic obstructive pulmonary disease) ([place]) 08/10/2017  . 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 UTI (urinary tract infection) 02/06/2017  . Fever 02/06/2017  . Septic shock ([place]) 10/18/2016  . Pyelonephritis   . Respiratory distress   . Sepsis ([place]) 10/17/2016  . GIB (gastrointestinal bleeding) 07/22/2016  . Personal  history of tobacco use, presenting hazards to health 11/12/2014    Past Surgical History:  Procedure Laterality Date  . ABDOMINAL HYSTERECTOMY    . APPENDECTOMY    . CHOLECYSTECTOMY    . COLONOSCOPY WITH PROPOFOL N/A 11/18/2018   Procedure: COLONOSCOPY WITH PROPOFOL;  Surgeon: Toledo, Benay Pike, MD;  Location: [facility] ENDOSCOPY;  Service: Gastroenterology;  Laterality: N/A;  . ESOPHAGOGASTRODUODENOSCOPY (EGD) WITH PROPOFOL N/A 07/24/2016   Procedure: ESOPHAGOGASTRODUODENOSCOPY (EGD) WITH PROPOFOL;  Surgeon: Jonathon Bellows, MD;  Location: [facility] ENDOSCOPY;  Service: Endoscopy;  Laterality: N/A;  . ESOPHAGOGASTRODUODENOSCOPY (EGD) WITH PROPOFOL N/A 11/21/2016   Procedure: ESOPHAGOGASTRODUODENOSCOPY (EGD) WITH PROPOFOL;  Surgeon: Toledo, Benay Pike, MD;  Location: [facility] ENDOSCOPY;  Service: Gastroenterology;  Laterality: N/A;  . ESOPHAGOGASTRODUODENOSCOPY (EGD) WITH PROPOFOL N/A 02/05/2018   Procedure: ESOPHAGOGASTRODUODENOSCOPY (EGD) WITH PROPOFOL;  Surgeon: Toledo, Benay Pike, MD;  Location: [facility] ENDOSCOPY;  Service: Endoscopy;  Laterality: N/A;  . TONSILLECTOMY      OB History   No obstetric history on file.      Home Medications    Prior to Admission medications   Medication Sig Start Date End Date Taking? Authorizing Provider  albuterol (VENTOLIN HFA) 108 (90 Base) MCG/ACT inhaler Inhale into the lungs every 6 (six) hours as needed for wheezing or shortness of breath.   Yes [provider]  ALPRAZolam (XANAX) 0.25 MG tablet Take 0.25 mg by mouth at bedtime  as needed for sleep. 07/06/16  Yes [provider]  amLODipine (NORVASC) 2.5 MG tablet Take 2.5 mg by mouth daily.   Yes [provider]  azelastine (ASTELIN) 0.1 % nasal spray Place into both nostrils 2 (two) times daily. Use in each nostril as directed   Yes [provider]  colestipol (COLESTID) 1 g tablet Take 1 g by mouth 2 (two) times daily.   Yes [provider]  cyclobenzaprine (FLEXERIL) 10  MG tablet Take 10 mg by mouth 3 (three) times daily as needed for muscle spasms.   Yes [provider]  glimepiride (AMARYL) 4 MG tablet Take 4 mg by mouth daily with breakfast.   Yes [provider]  HYDROcodone-acetaminophen (NORCO/VICODIN) 5-325 MG tablet Take 1 tablet by mouth every 8 (eight) hours as needed for moderate pain.    Yes [provider]  insulin NPH Human (HUMULIN N,NOVOLIN N) 100 UNIT/ML injection Inject 0.1 mLs (10 Units total) into the skin 2 (two) times daily before a meal. Use 30 units in the morning, 20 units during the day and 30 units at night. 02/08/17  Yes Wieting, Richard, MD  insulin regular (HUMULIN R) 100 units/mL injection Inject 0.05 mLs (5 Units total) into the skin 3 (three) times daily. Patient taking differently: Inject 30 Units into the skin 3 (three) times daily. 02/08/17  Yes Wieting, Richard, MD  meloxicam (MOBIC) 15 MG tablet Take 15 mg by mouth daily as needed for pain.   Yes [provider]  metFORMIN (GLUCOPHAGE-XR) 500 MG 24 hr tablet Take 1,000 mg by mouth every evening. 05/25/17  Yes [provider]  nadolol (CORGARD) 80 MG tablet Take 80 mg by mouth daily.   Yes [provider]  pantoprazole (PROTONIX) 40 MG tablet Take 40 mg by mouth 2 (two) times daily.    Yes [provider]  pregabalin (LYRICA) 50 MG capsule Take 50 mg by mouth 2 (two) times daily.   Yes [provider]  traZODone (DESYREL) 50 MG tablet Take 50 mg by mouth at bedtime. 06/19/16  Yes [provider]  fluticasone (FLONASE) 50 MCG/ACT nasal spray Place 2 sprays into both nostrils 2 (two) times daily. 06/26/17 04/11/20  [provider]  gabapentin (NEURONTIN) 100 MG capsule Take 100 mg by mouth 2 (two) times daily.  04/11/20  [provider]  rOPINIRole (REQUIP) 1 MG tablet Take 1 mg by mouth at bedtime.   04/11/20  [provider]    Family History Family History  Problem Relation Age  of Onset  . Lung cancer Father   . Heart Problems Father   . Heart failure Mother   . Breast cancer Neg Hx     Social History Social History   Tobacco Use  . Smoking status: Current Every Day Smoker    Packs/day: 1.00    Years: 48.00    Pack years: 48.00    Types: Cigarettes  . Smokeless tobacco: Never Used  . Tobacco comment: patient refused  Vaping Use  . Vaping Use: Never used  Substance Use Topics  . Alcohol use: No    Alcohol/week: 0.0 standard drinks  . Drug use: No     Allergies   Codeine, Penicillins, Tylenol with codeine #3 [acetaminophen-codeine], Amoxicillin, Lipitor [atorvastatin], and Lisinopril   Review of Systems Review of Systems  Constitutional: Negative for activity change, appetite change and fever.  Respiratory: Positive for cough. Negative for shortness of breath and wheezing.   Gastrointestinal: Positive for  abdominal distention, abdominal pain and nausea. Negative for diarrhea and vomiting.  Genitourinary: Positive for frequency and urgency. Negative for dysuria and hematuria.  Musculoskeletal: Positive for back pain.  Skin: Negative for rash.  Hematological: Negative.   Psychiatric/Behavioral: Negative.      Physical Exam Triage Vital Signs ED Triage Vitals  Enc Vitals Group     BP 04/11/20 1446 (!) 158/76     Pulse Rate 04/11/20 1446 63     Resp 04/11/20 1446 17     Temp 04/11/20 1446 97.7 F (36.5 C)     Temp Source 04/11/20 1446 Oral     SpO2 04/11/20 1446 92 %     Weight 04/11/20 1443 190 lb (86.2 kg)     Height 04/11/20 1443 5\' 7"  (1.702 m)     Head Circumference --      Peak Flow --      Pain Score 04/11/20 1443 8     Pain Loc --      Pain Edu? --      Excl. in [place]? --    No data found.  Updated Vital Signs BP (!) 158/76 (BP Location: Left Arm)   Pulse 63   Temp 97.7 F (36.5 C) (Oral)   Resp 17   Ht 5\' 7"  (1.702 m)   Wt 190 lb (86.2 kg)   SpO2 92%   BMI 29.76 kg/m   Visual Acuity Right Eye Distance:   Left  Eye Distance:   Bilateral Distance:    Right Eye Near:   Left Eye Near:    Bilateral Near:     Physical Exam Vitals and nursing note reviewed.  Constitutional:      General: She is not in acute distress.    Appearance: Normal appearance. She is obese. She is ill-appearing.  HENT:     Head: Normocephalic and atraumatic.  Cardiovascular:     Rate and Rhythm: Normal rate and regular rhythm.     Pulses: Normal pulses.     Heart sounds: Normal heart sounds. No murmur heard. No gallop.   Pulmonary:     Effort: Pulmonary effort is normal.     Breath sounds: Normal breath sounds. No wheezing, rhonchi or rales.  Abdominal:     General: There is distension.     Palpations: Abdomen is soft.     Tenderness: There is abdominal tenderness. There is no right CVA tenderness, left CVA tenderness, guarding or rebound.  Skin:    General: Skin is warm and dry.     Capillary Refill: Capillary refill takes less than 2 seconds.     Findings: No erythema or rash.  Neurological:     General: No focal deficit present.     Mental Status: She is alert and oriented to person, place, and time.  Psychiatric:        Mood and Affect: Mood normal.        Behavior: Behavior normal.        Thought Content: Thought content normal.        Judgment: Judgment normal.      UC Treatments / Results  Labs (all labs ordered are listed, but only abnormal results are displayed) Labs Reviewed  CBC WITH DIFFERENTIAL/PLATELET - Abnormal; Notable for the following components:      Result Value   WBC 3.7 (*)    HCT 35.6 (*)    Platelets 74 (*)    All other components within normal limits  COMPREHENSIVE METABOLIC PANEL - Abnormal;  Notable for the following components:   Sodium 131 (*)    Glucose, Bld 215 (*)    All other components within normal limits  URINALYSIS, COMPLETE (UACMP) WITH MICROSCOPIC - Abnormal; Notable for the following components:   Bacteria, UA RARE (*)    All other components within normal  limits    EKG   Radiology DG Chest 2 View  Result Date: 04/11/2020 CLINICAL DATA:  70 year old female with wheezing. EXAM: CHEST - 2 VIEW COMPARISON:  Chest radiograph dated 01/23/2020. FINDINGS: There is diffuse chronic interstitial coarsening and bronchitic changes. No focal consolidation, pleural effusion, or pneumothorax. The cardiac silhouette is within limits. Atherosclerotic calcification of the aorta. Degenerative changes of the spine. No acute osseous pathology. IMPRESSION: No active cardiopulmonary disease. Electronically Signed   By: Anner Crete M.D.   On: 04/11/2020 15:52    Procedures Procedures (including critical care time)  Medications Ordered in UC Medications - No data to display  Initial Impression / Assessment and Plan / UC Course  I have reviewed the triage vital signs and the nursing notes.  Pertinent labs & imaging results that were available during my care of the patient were reviewed by me and considered in my medical decision making (see chart for details).   Patient is a ill-appearing 70 year old female here for evaluation of right flank pain that started abruptly yesterday and has been constant at a rate of 8-10/10.  She has had associated nausea, states that she thinks her abdomen is more swollen, and has had urinary urgency and frequency.  Patient has a history of nonalcoholic cirrhosis, pyelonephritis, sepsis and septic shock, diabetes, and COPD.  Patient ports that her sugars have been elevated but is noncommittal when asking for ranges.  Patient's physical exam reveals expiratory wheezes on the left side.  Heart sounds are S1-S2 without ectopy, rub, or murmur.  Patient's abdomen is distended but soft with tenderness in the right upper quadrant.  Patient does not have CVA tenderness on exam.  Patient denies any exacerbating or relieving factors.  Will check CBC, CMP, UA, and chest x-ray.  Chest x-ray independently evaluated by me.  Interpretation: Patient  has patchy haziness in bilateral lower lobes and along the right mediastinal margin.  This appears to be an interval improvement from her chest x-ray from January 23, 2020.  Awaiting radiology overread.  Urology interpretation of chest x-ray as there is no active cardiopulmonary disease.  Urinalysis shows rare bacteria otherwise negative.  Comprehensive metabolic shows mild hyponatremia with a sodium of 131 and elevated glucose of 215.  Transaminases and renal function are unremarkable.  CBC shows thrombocytopenia with a platelet count of 73.  This is an improvement from 5 days ago when her platelet count was 69 drawn by her PCP at [facility].  We will treat patient for right-sided back pain.  Patient states that she has Flexeril at home and I will have her use the Flexeril for potential muscle spasm and she has hydrocodone at home that she can use for pain.  Patient has an appointment to see her primary care provider on Tuesday of this week and she can follow-up at that time if the muscle relaxer is not helping with her pain.   Final Clinical Impressions(s) / UC Diagnoses   Final diagnoses:  Right flank pain     Discharge Instructions     Use your hydrocodone and your Flexeril that already prescribed for you to help treat your pain and any potential muscle spasm that  may be causing the pain in your back.  Your blood work and urinalysis today did not reveal the presence of any infection and neither did your chest x-ray.  Keep your appointment with coming Tuesday with your primary care provider and follow-up with them if your pain continues and is not being helped by the hydrocodone and Flexeril.    ED Prescriptions    None     PDMP not reviewed this encounter.   Margarette Canada, NP 04/11/20 1610

## 2020-04-11 NOTE — ED Triage Notes (Signed)
Patient complains of right upper back pain that started yesterday without injury. Patient states that pain has been constant. 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

## 2020-04-27 ENCOUNTER — Telehealth: Payer: Self-pay | Admitting: *Deleted

## 2020-04-27 DIAGNOSIS — Z87891 Personal history of nicotine dependence: Secondary | ICD-10-CM

## 2020-04-27 DIAGNOSIS — F17200 Nicotine dependence, unspecified, uncomplicated: Secondary | ICD-10-CM

## 2020-04-27 DIAGNOSIS — Z122 Encounter for screening for malignant neoplasm of respiratory organs: Secondary | ICD-10-CM

## 2020-04-27 NOTE — Telephone Encounter (Signed)
Spoke to patient via telephone re :scheduling her annual lung screening scan. Current smoker, 1 ppd x 48 years. Scan scheduled for 05/13/20 @ 12:30pm.

## 2020-05-13 ENCOUNTER — Other Ambulatory Visit: Payer: Self-pay

## 2020-05-13 ENCOUNTER — Ambulatory Visit
Admission: RE | Admit: 2020-05-13 | Discharge: 2020-05-13 | Disposition: A | Discharge status: Home / Self Care | Payer: Medicare Other | Source: Ambulatory Visit | Attending: Oncology | Admitting: Oncology

## 2020-05-13 DIAGNOSIS — Z122 Encounter for screening for malignant neoplasm of respiratory organs: Secondary | ICD-10-CM | POA: Insufficient documentation

## 2020-05-13 DIAGNOSIS — Z87891 Personal history of nicotine dependence: Secondary | ICD-10-CM | POA: Insufficient documentation

## 2020-05-13 DIAGNOSIS — F17200 Nicotine dependence, unspecified, uncomplicated: Secondary | ICD-10-CM | POA: Insufficient documentation

## 2020-05-20 ENCOUNTER — Encounter: Payer: Self-pay | Admitting: *Deleted

## 2020-10-22 ENCOUNTER — Other Ambulatory Visit: Payer: Self-pay | Admitting: Internal Medicine

## 2020-10-22 DIAGNOSIS — K7460 Unspecified cirrhosis of liver: Secondary | ICD-10-CM

## 2020-11-11 ENCOUNTER — Ambulatory Visit: Payer: Medicare Other | Attending: Internal Medicine

## 2021-01-25 ENCOUNTER — Other Ambulatory Visit: Payer: Self-pay | Admitting: Internal Medicine

## 2021-01-25 DIAGNOSIS — K7460 Unspecified cirrhosis of liver: Secondary | ICD-10-CM

## 2021-02-03 ENCOUNTER — Ambulatory Visit: Payer: Medicare Other

## 2021-06-29 ENCOUNTER — Telehealth: Payer: Self-pay | Admitting: Acute Care

## 2021-06-29 ENCOUNTER — Telehealth: Payer: Self-pay

## 2021-06-29 NOTE — Telephone Encounter (Signed)
Attempted to reach pt to schedule annual LDCT-LVMM

## 2021-07-12 ENCOUNTER — Other Ambulatory Visit: Payer: Self-pay

## 2021-07-12 DIAGNOSIS — Z87891 Personal history of nicotine dependence: Secondary | ICD-10-CM

## 2021-07-12 DIAGNOSIS — Z122 Encounter for screening for malignant neoplasm of respiratory organs: Secondary | ICD-10-CM

## 2021-07-12 DIAGNOSIS — F17210 Nicotine dependence, cigarettes, uncomplicated: Secondary | ICD-10-CM

## 2021-07-20 ENCOUNTER — Ambulatory Visit: Admission: RE | Admit: 2021-07-20 | Payer: Medicare Other | Source: Ambulatory Visit

## 2021-07-22 ENCOUNTER — Other Ambulatory Visit: Payer: Self-pay | Admitting: Acute Care

## 2021-07-22 DIAGNOSIS — Z122 Encounter for screening for malignant neoplasm of respiratory organs: Secondary | ICD-10-CM

## 2021-07-22 DIAGNOSIS — Z87891 Personal history of nicotine dependence: Secondary | ICD-10-CM

## 2021-07-22 DIAGNOSIS — F17210 Nicotine dependence, cigarettes, uncomplicated: Secondary | ICD-10-CM

## 2021-07-25 ENCOUNTER — Ambulatory Visit
Admission: RE | Admit: 2021-07-25 | Discharge: 2021-07-25 | Disposition: A | Discharge status: Home / Self Care | Payer: Medicare Other | Source: Ambulatory Visit | Attending: Acute Care | Admitting: Acute Care

## 2021-07-25 ENCOUNTER — Telehealth: Payer: Self-pay | Admitting: Acute Care

## 2021-07-25 DIAGNOSIS — Z122 Encounter for screening for malignant neoplasm of respiratory organs: Secondary | ICD-10-CM | POA: Diagnosis present

## 2021-07-25 DIAGNOSIS — F17210 Nicotine dependence, cigarettes, uncomplicated: Secondary | ICD-10-CM | POA: Diagnosis present

## 2021-07-25 DIAGNOSIS — Z87891 Personal history of nicotine dependence: Secondary | ICD-10-CM | POA: Insufficient documentation

## 2021-07-25 NOTE — Telephone Encounter (Signed)
Call report from [place]: 1. Lung-RADS 3, probably benign findings. Short-term follow-up in 6 months is recommended with repeat low-dose chest CT without contrast (please use the following order, "CT CHEST LCS NODULE FOLLOW-UP W/O CM").   New 5.6 mm right lower lobe nodule.   These results will be called to the ordering clinician or representative by the Radiologist Assistant, and communication documented in the PACS or [employer]. 2. Cirrhosis.  Spleen appears enlarged but is incompletely imaged. 3. Aortic atherosclerosis (ICD10-I70.0). Coronary artery calcification. 4.  Emphysema (ICD10-J43.9).

## 2021-07-27 ENCOUNTER — Telehealth: Payer: Self-pay | Admitting: Acute Care

## 2021-07-27 DIAGNOSIS — Z87891 Personal history of nicotine dependence: Secondary | ICD-10-CM

## 2021-07-27 DIAGNOSIS — R911 Solitary pulmonary nodule: Secondary | ICD-10-CM

## 2021-07-27 DIAGNOSIS — F17210 Nicotine dependence, cigarettes, uncomplicated: Secondary | ICD-10-CM

## 2021-07-27 NOTE — Telephone Encounter (Signed)
I have called the patient with the results of her low dose CT Chest. I explained that she does have a new nodule that is 5.6 mm in her right lower lobe that we need to re-evaluate in 6 months to make sure it is stable. She is in agreement with this plan.   I have also explained that her spleen appears enlarged. She does have some abdominal tenderness. She has an appointment with her PCP 08/05/2021. I have asked her to follow up with him to see if he feels any additional imaging is clinically indicated. She verbalized understanding and said she would. I have included her PCP on this phone message as an Juluis Rainier so he knows the plan.   Additionally there is notation of CAD. She is unable to take statin medications due to non alcoholic cirrhosis. She is aware of the finding.   Langley Gauss, please fax results to PCP and order 6 month follow up low dose CT chest. Thanks so much.

## 2021-07-28 NOTE — Telephone Encounter (Signed)
CT results faxed to PCP with follow up plans included. Order placed for 6 mth nodule CT f/u.

## 2021-08-05 ENCOUNTER — Other Ambulatory Visit: Payer: Self-pay | Admitting: Internal Medicine

## 2021-08-05 DIAGNOSIS — Z1231 Encounter for screening mammogram for malignant neoplasm of breast: Secondary | ICD-10-CM

## 2021-08-10 ENCOUNTER — Other Ambulatory Visit: Payer: Self-pay | Admitting: Internal Medicine

## 2021-08-10 DIAGNOSIS — J438 Other emphysema: Secondary | ICD-10-CM

## 2021-08-10 DIAGNOSIS — K7460 Unspecified cirrhosis of liver: Secondary | ICD-10-CM

## 2021-08-18 ENCOUNTER — Other Ambulatory Visit: Payer: Medicare Other

## 2021-08-23 ENCOUNTER — Other Ambulatory Visit: Payer: Medicare Other

## 2021-09-10 ENCOUNTER — Ambulatory Visit
Admission: EM | Admit: 2021-09-10 | Discharge: 2021-09-10 | Disposition: A | Discharge status: Home / Self Care | Payer: Medicare Other | Attending: Emergency Medicine | Admitting: Emergency Medicine

## 2021-09-10 ENCOUNTER — Encounter: Payer: Self-pay | Admitting: Emergency Medicine

## 2021-09-10 DIAGNOSIS — Z20822 Contact with and (suspected) exposure to covid-19: Secondary | ICD-10-CM | POA: Insufficient documentation

## 2021-09-10 DIAGNOSIS — H00025 Hordeolum internum left lower eyelid: Secondary | ICD-10-CM | POA: Diagnosis present

## 2021-09-10 DIAGNOSIS — B9789 Other viral agents as the cause of diseases classified elsewhere: Secondary | ICD-10-CM | POA: Insufficient documentation

## 2021-09-10 DIAGNOSIS — H00015 Hordeolum externum left lower eyelid: Secondary | ICD-10-CM

## 2021-09-10 DIAGNOSIS — J029 Acute pharyngitis, unspecified: Secondary | ICD-10-CM | POA: Diagnosis present

## 2021-09-10 LAB — SARS CORONAVIRUS 2 BY RT PCR: SARS Coronavirus 2 by RT PCR: NEGATIVE

## 2021-09-10 LAB — GROUP A STREP BY PCR: Group A Strep by PCR: NOT DETECTED

## 2021-09-10 MED ORDER — ERYTHROMYCIN 5 MG/GM OP OINT: TOPICAL_OINTMENT | OPHTHALMIC | 0 refills | Status: DC

## 2021-09-10 NOTE — ED Provider Notes (Signed)
[facility]    CSN: [id number] Arrival date & time: 09/10/21  4627      History   Chief Complaint Chief Complaint  Patient presents with   Sore Throat    HPI Vanessa Oneill is a 71 y.o. female.   HPI  71 year old female here for evaluation of sore throat.  Patient reports that she developed sore throat, most predominantly on the right side, 2 days ago.  She states that it feels very similar to when she acquired strep throat from her grandson many years ago.  She does endorse a runny nose but states that she has this all the time and it is not a new symptom.  She is also been experiencing a nonproductive cough.  She denies any measured fever, nasal congestion, ear pain, shortness breath, wheezing, GI complaints, body ache, or headaches.  Additionally, she noticed a red bump on her left lower eyelid yesterday.  She denies any pain, change in vision, or pain when she moves her eye or blinks.  No drainage.  Past Medical History:  Diagnosis Date   Acid reflux    Anxiety    Aortic atherosclerosis (HCC)    Arthritis    Cerebral aneurysm    Colon adenomas    COPD (chronic obstructive pulmonary disease) (HCC)    Coronary artery disease    Depression    Diabetes mellitus without complication (HCC)    ESBL (extended spectrum beta-lactamase) producing bacteria infection    Headache    Hyperlipidemia    Hypertension    Liver disease    Lumbago    Mitral valve regurgitation    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[place])    Personal history of tobacco use, presenting hazards to health 11/12/2014   Raynaud's disease    Sleep apnea     Patient Active Problem List   Diagnosis Date Noted   HTN (hypertension) 08/10/2017   HLD (hyperlipidemia) 08/10/2017   Diabetes ([place]) 08/10/2017   COPD (chronic obstructive pulmonary disease) ([place]) 03/50/0938   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[place]) 18/29/9371   UTI (urinary tract infection) 02/06/2017   Fever 02/06/2017   Septic shock ([place])  10/18/2016   Pyelonephritis    Respiratory distress    Sepsis ([place]) 10/17/2016   GIB (gastrointestinal bleeding) 07/22/2016   Personal history of tobacco use, presenting hazards to health 11/12/2014    Past Surgical History:  Procedure Laterality Date   ABDOMINAL HYSTERECTOMY     APPENDECTOMY     CHOLECYSTECTOMY     COLONOSCOPY WITH PROPOFOL N/A 11/18/2018   Procedure: COLONOSCOPY WITH PROPOFOL;  Surgeon: Toledo, Benay Pike, MD;  Location: [facility] ENDOSCOPY;  Service: Gastroenterology;  Laterality: N/A;   ESOPHAGOGASTRODUODENOSCOPY (EGD) WITH PROPOFOL N/A 07/24/2016   Procedure: ESOPHAGOGASTRODUODENOSCOPY (EGD) WITH PROPOFOL;  Surgeon: Jonathon Bellows, MD;  Location: [facility] ENDOSCOPY;  Service: Endoscopy;  Laterality: N/A;   ESOPHAGOGASTRODUODENOSCOPY (EGD) WITH PROPOFOL N/A 11/21/2016   Procedure: ESOPHAGOGASTRODUODENOSCOPY (EGD) WITH PROPOFOL;  Surgeon: Toledo, Benay Pike, MD;  Location: [facility] ENDOSCOPY;  Service: Gastroenterology;  Laterality: N/A;   ESOPHAGOGASTRODUODENOSCOPY (EGD) WITH PROPOFOL N/A 02/05/2018   Procedure: ESOPHAGOGASTRODUODENOSCOPY (EGD) WITH PROPOFOL;  Surgeon: Toledo, Benay Pike, MD;  Location: [facility] ENDOSCOPY;  Service: Endoscopy;  Laterality: N/A;   TONSILLECTOMY      OB History   No obstetric history on file.      Home Medications    Prior to Admission medications   Medication Sig Start Date End Date Taking? Authorizing Provider  amLODipine (NORVASC) 2.5 MG tablet Take 2.5 mg by mouth  daily.   Yes [provider]  colestipol (COLESTID) 1 g tablet Take 1 g by mouth 2 (two) times daily.   Yes [provider]  erythromycin ophthalmic ointment Place a 1/2 inch ribbon of ointment into the lower eyelid. 09/10/21  Yes Margarette Canada, NP  gabapentin (NEURONTIN) 100 MG capsule Take 1 capsule by mouth 2 (two) times daily. 02/18/21  Yes [provider]  glimepiride (AMARYL) 4 MG tablet Take 4 mg by mouth daily with breakfast.   Yes [provider]   nadolol (CORGARD) 80 MG tablet Take 80 mg by mouth daily.   Yes [provider]  pantoprazole (PROTONIX) 40 MG tablet Take 40 mg by mouth 2 (two) times daily.    Yes [provider]  traZODone (DESYREL) 50 MG tablet Take 50 mg by mouth at bedtime. 06/19/16  Yes [provider]  albuterol (VENTOLIN HFA) 108 (90 Base) MCG/ACT inhaler Inhale into the lungs every 6 (six) hours as needed for wheezing or shortness of breath.    [provider]  ALPRAZolam Duanne Moron) 0.25 MG tablet Take 0.25 mg by mouth at bedtime as needed for sleep. 07/06/16   [provider]  azelastine (ASTELIN) 0.1 % nasal spray Place into both nostrils 2 (two) times daily. Use in each nostril as directed    [provider]  busPIRone (BUSPAR) 7.5 MG tablet Take 7.5 mg by mouth 2 (two) times daily. 09/04/21   [provider]  cyclobenzaprine (FLEXERIL) 10 MG tablet Take 10 mg by mouth 3 (three) times daily as needed for muscle spasms.    [provider]  HYDROcodone-acetaminophen (NORCO/VICODIN) 5-325 MG tablet Take 1 tablet by mouth every 8 (eight) hours as needed for moderate pain.     [provider]  insulin NPH Human (HUMULIN N,NOVOLIN N) 100 UNIT/ML injection Inject 0.1 mLs (10 Units total) into the skin 2 (two) times daily before a meal. Use 30 units in the morning, 20 units during the day and 30 units at night. 02/08/17   Loletha Grayer, MD  meloxicam (MOBIC) 15 MG tablet Take 15 mg by mouth daily as needed for pain.    [provider]  fluticasone (FLONASE) 50 MCG/ACT nasal spray Place 2 sprays into both nostrils 2 (two) times daily. 06/26/17 04/11/20  [provider]  rOPINIRole (REQUIP) 1 MG tablet Take 1 mg by mouth at bedtime.   04/11/20  [provider]    Family History Family History  Problem Relation Age of Onset   Lung cancer Father    Heart Problems Father    Heart failure Mother    Breast cancer Neg Hx      Social History Social History   Tobacco Use   Smoking status: Every Day    Packs/day: 1.00    Years: 48.00    Total pack years: 48.00    Types: Cigarettes   Smokeless tobacco: Never   Tobacco comments:    patient refused  Vaping Use   Vaping Use: Never used  Substance Use Topics   Alcohol use: No    Alcohol/week: 0.0 standard drinks of alcohol   Drug use: No     Allergies   Codeine, Penicillins, Tylenol with codeine #3 [acetaminophen-codeine], Amoxicillin, Lipitor [atorvastatin], and Lisinopril   Review of Systems Review of Systems  Constitutional:  Negative for fever.  HENT:  Positive for rhinorrhea and sore throat. Negative for congestion, ear discharge and ear pain.   Eyes:  Negative for photophobia, pain,  discharge, redness, itching and visual disturbance.       Red bump on left lower eyelid.  Respiratory:  Positive for cough. Negative for shortness of breath and wheezing.   Gastrointestinal:  Negative for diarrhea, nausea and vomiting.  Musculoskeletal:  Negative for arthralgias and myalgias.  Neurological:  Negative for headaches.  Hematological: Negative.   Psychiatric/Behavioral: Negative.       Physical Exam Triage Vital Signs ED Triage Vitals  Enc Vitals Group     BP      Pulse      Resp      Temp      Temp src      SpO2      Weight      Height      Head Circumference      Peak Flow      Pain Score      Pain Loc      Pain Edu?      Excl. in [place]?    No data found.  Updated Vital Signs BP 106/66 (BP Location: Left Arm)   Pulse (!) 56   Temp 98.1 F (36.7 C) (Oral)   Resp 14   Ht '5\' 7"'$  (1.702 m)   Wt 168 lb (76.2 kg)   SpO2 95%   BMI 26.31 kg/m   Visual Acuity Right Eye Distance:   Left Eye Distance:   Bilateral Distance:    Right Eye Near:   Left Eye Near:    Bilateral Near:     Physical Exam Vitals and nursing note reviewed.  Constitutional:      Appearance: Normal appearance. She is not ill-appearing.  HENT:      Head: Normocephalic and atraumatic.     Right Ear: External ear normal. There is impacted cerumen.     Left Ear: External ear normal. There is impacted cerumen.     Nose: Congestion and rhinorrhea present.     Mouth/Throat:     Mouth: Mucous membranes are moist.     Pharynx: Oropharynx is clear. Posterior oropharyngeal erythema present. No oropharyngeal exudate.  Eyes:     General: No scleral icterus.    Extraocular Movements: Extraocular movements intact.     Pupils: Pupils are equal, round, and reactive to light.  Cardiovascular:     Rate and Rhythm: Normal rate and regular rhythm.     Pulses: Normal pulses.     Heart sounds: Normal heart sounds. No murmur heard.    No friction rub. No gallop.  Pulmonary:     Effort: Pulmonary effort is normal.     Breath sounds: Normal breath sounds. No wheezing, rhonchi or rales.  Musculoskeletal:     Cervical back: Normal range of motion and neck supple.  Lymphadenopathy:     Cervical: No cervical adenopathy.  Skin:    General: Skin is warm and dry.     Capillary Refill: Capillary refill takes less than 2 seconds.     Findings: No erythema or rash.  Neurological:     General: No focal deficit present.     Mental Status: She is alert and oriented to person, place, and time.  Psychiatric:        Mood and Affect: Mood normal.        Behavior: Behavior normal.        Thought Content: Thought content normal.        Judgment: Judgment normal.      UC Treatments / Results  Labs (all labs  ordered are listed, but only abnormal results are displayed) Labs Reviewed  GROUP A STREP BY PCR  SARS CORONAVIRUS 2 BY RT PCR    EKG   Radiology No results found.  Procedures Procedures (including critical care time)  Medications Ordered in UC Medications - No data to display  Initial Impression / Assessment and Plan / UC Course  I have reviewed the triage vital signs and the nursing notes.  Pertinent labs & imaging results that were  available during my care of the patient were reviewed by me and considered in my medical decision making (see chart for details).   Patient is a nontoxic-appearing 71 year old female here for evaluation of respiratory and eye complaints.  The respiratory complaints began 2 days ago and the eye complaint began yesterday.  On exam, patient has very narrow external auditory canals with cerumen impaction bilaterally.  Her external ears appear normal.  Nasal mucosa is mildly edematous and pink in color with slight clear rhinorrhea.  Oropharyngeal exam reveals erythema of the soft palate and posterior oropharynx.  Tonsils are surgically absent.  No anterior cervical lymphadenopathy appreciated on exam.  Cardiopulmonary exam reveals S1-S2 heart sounds with regular rate and rhythm and lung sounds are clear to auscultation all fields.  Patient's ophthalmoscopic exam reveals a normal red light reflex and normal optic disc.  Her pupils are equal round and reactive and her EOM is intact.  There is no erythema or injection of her labral or bulbar conjunctiva bilaterally.  When visualizing the lower conjunctiva there is a developing central hordeolum internum as well as a hordeolum externum which is in line with the red lump on the lower eyelid.  Patient has 2 developing styes in that eye that I will treat with erythromycin eye ointment and eye hygiene regimen.  I will also order strep PCR and COVID PCR.  Patient did take a home COVID test which was negative.  COVID PCR is negative.  Strep PCR is negative.  I am going to diagnose the patient with viral pharyngitis and discharged home with supportive care.  I will also diagnosed her with both a hordeolum internum and hordeolum externum and treat her with erythromycin eye ointment and eye hygiene.  If her symptoms do not improve with her eye she should follow-up with ophthalmology.  Patient is free to return here for reevaluation of any new or worsening symptoms with  regards to her respiratory complaints.   Final Clinical Impressions(s) / UC Diagnoses   Final diagnoses:  Viral pharyngitis  Hordeolum externum of left lower eyelid  Hordeolum internum of left lower eyelid     Discharge Instructions      Your strep and COVID test were both negative but I do believe you have a viral infection in your throat.  Gargle with warm salt water 2-3 times a day to soothe your throat, aid in pain relief, and aid in healing.  Take over-the-counter ibuprofen according to the package instructions as needed for pain.  You can also use Chloraseptic or Sucrets lozenges, 1 lozenge every 2 hours as needed for throat pain.  Continue to apply warm compresses.  Perform eyelid hygiene with baby shampoo in a rich lather and vigorous scrubbing of your eyelids twice daily. Rinse thoroughly with water.  Apply Erythromycin ointment to the eyelid margin with a clean Q-tip twice daily, and a 1/2 inch ribbon to the inside of the lower lid. Blink several times to liquify the ointment and spread it ober the inside of  your eyelids.  If your symptoms do not improve, or you develop changes in your vision follow up with your eye doctor.    If you develop any new or worsening symptoms return for reevaluation.      ED Prescriptions     Medication Sig Dispense Auth. Provider   erythromycin ophthalmic ointment Place a 1/2 inch ribbon of ointment into the lower eyelid. 3.5 g Margarette Canada, NP      PDMP not reviewed this encounter.   Margarette Canada, NP 09/10/21 [phone]

## 2021-09-10 NOTE — Discharge Instructions (Addendum)
Your strep and COVID test were both negative but I do believe you have a viral infection in your throat.  Gargle with warm salt water 2-3 times a day to soothe your throat, aid in pain relief, and aid in healing.  Take over-the-counter ibuprofen according to the package instructions as needed for pain.  You can also use Chloraseptic or Sucrets lozenges, 1 lozenge every 2 hours as needed for throat pain.  Continue to apply warm compresses.  Perform eyelid hygiene with baby shampoo in a rich lather and vigorous scrubbing of your eyelids twice daily. Rinse thoroughly with water.  Apply Erythromycin ointment to the eyelid margin with a clean Q-tip twice daily, and a 1/2 inch ribbon to the inside of the lower lid. Blink several times to liquify the ointment and spread it ober the inside of your eyelids.  If your symptoms do not improve, or you develop changes in your vision follow up with your eye doctor.    If you develop any new or worsening symptoms return for reevaluation.

## 2021-09-10 NOTE — ED Triage Notes (Signed)
Patient c/o sore throat that started 2 days ago.  Patient unsure of fevers.  Patient took covid test yesterday at home and was negative.  Patient reports she noticed a small red bump under her left eye yesterday.  Patient denies any pain in her eyes.

## 2021-10-19 ENCOUNTER — Other Ambulatory Visit: Payer: Self-pay | Admitting: Acute Care

## 2021-10-19 DIAGNOSIS — Z87891 Personal history of nicotine dependence: Secondary | ICD-10-CM

## 2021-10-19 DIAGNOSIS — F17210 Nicotine dependence, cigarettes, uncomplicated: Secondary | ICD-10-CM

## 2021-10-19 DIAGNOSIS — R911 Solitary pulmonary nodule: Secondary | ICD-10-CM

## 2021-11-07 ENCOUNTER — Other Ambulatory Visit: Payer: Self-pay | Admitting: Internal Medicine

## 2021-11-07 DIAGNOSIS — K7460 Unspecified cirrhosis of liver: Secondary | ICD-10-CM

## 2021-11-11 ENCOUNTER — Ambulatory Visit: Payer: Medicare Other

## 2021-11-20 ENCOUNTER — Ambulatory Visit
Admission: EM | Admit: 2021-11-20 | Discharge: 2021-11-20 | Disposition: A | Discharge status: Home / Self Care | Payer: Medicare Other | Attending: Emergency Medicine | Admitting: Emergency Medicine

## 2021-11-20 DIAGNOSIS — L089 Local infection of the skin and subcutaneous tissue, unspecified: Secondary | ICD-10-CM | POA: Diagnosis not present

## 2021-11-20 DIAGNOSIS — S61219A Laceration without foreign body of unspecified finger without damage to nail, initial encounter: Secondary | ICD-10-CM

## 2021-11-20 MED ORDER — DOXYCYCLINE HYCLATE 100 MG PO CAPS: 100.0000 mg | ORAL_CAPSULE | Freq: Two times a day (BID) | ORAL | 0 refills | Status: AC

## 2021-11-20 MED ORDER — CHLORHEXIDINE GLUCONATE 4 % EX LIQD: Freq: Every day | CUTANEOUS | 0 refills | Status: DC | PRN

## 2021-11-20 NOTE — ED Provider Notes (Signed)
HPI  SUBJECTIVE:  Vanessa Oneill is a right-handed 71 y.o. female who presents with right index finger erythema, swelling, pain described as constant soreness after a nail technician nicked her finger with a sander 3 weeks ago.  She reports yellow drainage.  No numbness or tingling, fevers,  limitation of motion, isolated fingerpad swelling.  No antipyretic in the past 6 hours.  She been applying Neosporin, Neosporin with lidocaine and taking Tylenol without improvement of symptoms.  Symptoms are worse with hitting it against things, palpation and use.  She has a past medical history of cerebral aneurysm, COPD, coronary disease, diabetes, hyperlipidemia, hypertension.  No history of MRSA.  [facility].  Past Medical History:  Diagnosis Date   Acid reflux    Anxiety    Aortic atherosclerosis (HCC)    Arthritis    Cerebral aneurysm    Colon adenomas    COPD (chronic obstructive pulmonary disease) ([place])    Coronary artery disease    Depression    Diabetes mellitus without complication (HCC)    ESBL (extended spectrum beta-lactamase) producing bacteria infection    Headache    Hyperlipidemia    Hypertension    Liver disease    Lumbago    Mitral valve regurgitation    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[place])    Personal history of tobacco use, presenting hazards to health 11/12/2014   Raynaud's disease    Sleep apnea     Past Surgical History:  Procedure Laterality Date   ABDOMINAL HYSTERECTOMY     APPENDECTOMY     CHOLECYSTECTOMY     COLONOSCOPY WITH PROPOFOL N/A 11/18/2018   Procedure: COLONOSCOPY WITH PROPOFOL;  Surgeon: Toledo, Benay Pike, MD;  Location: [facility] ENDOSCOPY;  Service: Gastroenterology;  Laterality: N/A;   ESOPHAGOGASTRODUODENOSCOPY (EGD) WITH PROPOFOL N/A 07/24/2016   Procedure: ESOPHAGOGASTRODUODENOSCOPY (EGD) WITH PROPOFOL;  Surgeon: Jonathon Bellows, MD;  Location: [facility] ENDOSCOPY;  Service: Endoscopy;  Laterality: N/A;   ESOPHAGOGASTRODUODENOSCOPY (EGD) WITH  PROPOFOL N/A 11/21/2016   Procedure: ESOPHAGOGASTRODUODENOSCOPY (EGD) WITH PROPOFOL;  Surgeon: Toledo, Benay Pike, MD;  Location: [facility] ENDOSCOPY;  Service: Gastroenterology;  Laterality: N/A;   ESOPHAGOGASTRODUODENOSCOPY (EGD) WITH PROPOFOL N/A 02/05/2018   Procedure: ESOPHAGOGASTRODUODENOSCOPY (EGD) WITH PROPOFOL;  Surgeon: Toledo, Benay Pike, MD;  Location: [facility] ENDOSCOPY;  Service: Endoscopy;  Laterality: N/A;   TONSILLECTOMY      Family History  Problem Relation Age of Onset   Lung cancer Father    Heart Problems Father    Heart failure Mother    Breast cancer Neg Hx     Social History   Tobacco Use   Smoking status: Every Day    Packs/day: 1.00    Years: 48.00    Total pack years: 48.00    Types: Cigarettes   Smokeless tobacco: Never   Tobacco comments:    patient refused  Vaping Use   Vaping Use: Never used  Substance Use Topics   Alcohol use: No    Alcohol/week: 0.0 standard drinks of alcohol   Drug use: No    No current facility-administered medications for this encounter.  Current Outpatient Medications:    chlorhexidine (HIBICLENS) 4 % external liquid, Apply topically daily as needed. Dilute 10-15 mL in water, Use daily when bathing for 1-2 weeks, Disp: 120 mL, Rfl: 0   doxycycline (VIBRAMYCIN) 100 MG capsule, Take 1 capsule (100 mg total) by mouth 2 (two) times daily for 7 days., Disp: 14 capsule, Rfl: 0   albuterol (VENTOLIN HFA) 108 (90 Base) MCG/ACT inhaler, Inhale  into the lungs every 6 (six) hours as needed for wheezing or shortness of breath., Disp: , Rfl:    ALPRAZolam (XANAX) 0.25 MG tablet, Take 0.25 mg by mouth at bedtime as needed for sleep., Disp: , Rfl: 5   amLODipine (NORVASC) 2.5 MG tablet, Take 2.5 mg by mouth daily., Disp: , Rfl:    azelastine (ASTELIN) 0.1 % nasal spray, Place into both nostrils 2 (two) times daily. Use in each nostril as directed, Disp: , Rfl:    busPIRone (BUSPAR) 7.5 MG tablet, Take 7.5 mg by mouth 2 (two) times daily., Disp: ,  Rfl:    colestipol (COLESTID) 1 g tablet, Take 1 g by mouth 2 (two) times daily., Disp: , Rfl:    cyclobenzaprine (FLEXERIL) 10 MG tablet, Take 10 mg by mouth 3 (three) times daily as needed for muscle spasms., Disp: , Rfl:    erythromycin ophthalmic ointment, Place a 1/2 inch ribbon of ointment into the lower eyelid., Disp: 3.5 g, Rfl: 0   gabapentin (NEURONTIN) 100 MG capsule, Take 1 capsule by mouth 2 (two) times daily., Disp: , Rfl:    glimepiride (AMARYL) 4 MG tablet, Take 4 mg by mouth daily with breakfast., Disp: , Rfl:    HYDROcodone-acetaminophen (NORCO/VICODIN) 5-325 MG tablet, Take 1 tablet by mouth every 8 (eight) hours as needed for moderate pain. , Disp: , Rfl:    insulin NPH Human (HUMULIN N,NOVOLIN N) 100 UNIT/ML injection, Inject 0.1 mLs (10 Units total) into the skin 2 (two) times daily before a meal. Use 30 units in the morning, 20 units during the day and 30 units at night., Disp: 10 mL, Rfl: 11   meloxicam (MOBIC) 15 MG tablet, Take 15 mg by mouth daily as needed for pain., Disp: , Rfl:    nadolol (CORGARD) 80 MG tablet, Take 80 mg by mouth daily., Disp: , Rfl:    pantoprazole (PROTONIX) 40 MG tablet, Take 40 mg by mouth 2 (two) times daily. , Disp: , Rfl:    traZODone (DESYREL) 50 MG tablet, Take 50 mg by mouth at bedtime., Disp: , Rfl: 0  Allergies  Allergen Reactions   Codeine    Penicillins    Tylenol With Codeine #3 [Acetaminophen-Codeine]    Amoxicillin Itching and Rash   Lipitor [Atorvastatin] Other (See Comments)    Muscle Pain   Lisinopril Cough     ROS  As noted in HPI.   Physical Exam  BP 123/78 (BP Location: Left Arm)   Pulse 90   Temp 98.1 F (36.7 C) (Oral)   Ht '5\' 7"'$  (1.702 m)   Wt 78 kg   SpO2 94%   BMI 26.94 kg/m   Constitutional: Well developed, well nourished, no acute distress Eyes:  EOMI, conjunctiva normal bilaterally HENT: Normocephalic, atraumatic,mucus membranes moist Respiratory: Normal inspiratory effort Cardiovascular:  Normal rate GI: nondistended skin: Hyperemic tender laceration radial nail fold distal phalanx right index finger.  No expressible purulent drainage.  No evidence of felon.  No tenderness along the flexor tendon.    Musculoskeletal: no deformities Neurologic: Alert & oriented x 3, no focal neuro deficits Psychiatric: Speech and behavior appropriate   ED Course   Medications - No data to display  No orders of the defined types were placed in this encounter.   No results found for this or any previous visit (from the past 24 hour(s)). No results found.  ED Clinical Impression  1. Laceration of finger with infection, initial encounter  ED Assessment/Plan     Patient with an infected finger laceration.  Home with doxycycline for 7 days, Hibiclens, Tylenol 1000 mg 3 times daily.  Follow-up with PCP if not better after finishing the antibiotics.  ER return precautions given.  Discussed MDM, treatment plan, and plan for follow-up with patient. Discussed sn/sx that should prompt return to the ED. patient agrees with plan.   Meds ordered this encounter  Medications   chlorhexidine (HIBICLENS) 4 % external liquid    Sig: Apply topically daily as needed. Dilute 10-15 mL in water, Use daily when bathing for 1-2 weeks    Dispense:  120 mL    Refill:  0   doxycycline (VIBRAMYCIN) 100 MG capsule    Sig: Take 1 capsule (100 mg total) by mouth 2 (two) times daily for 7 days.    Dispense:  14 capsule    Refill:  0      *This clinic note was created using [employer]. Therefore, there may be occasional mistakes despite careful proofreading.  ?    Melynda Ripple, MD 11/20/21 0830

## 2021-11-20 NOTE — ED Triage Notes (Signed)
Patient reports that she was getting her nails done and the nail tech nicked her right pointer finger. Patient reports p[ain and swelling to the site -- started about 1 week ago.

## 2021-11-20 NOTE — Discharge Instructions (Signed)
Keep this clean with a Hibiclens soap and water.  Finish the doxycycline, even if you feel better.  May take 1000 mg of Tylenol 3 times a day as needed for pain.  You can use Neosporin, but give it some dry time so that it can heal.

## 2021-11-23 ENCOUNTER — Ambulatory Visit: Admission: RE | Admit: 2021-11-23 | Payer: Medicare Other | Source: Ambulatory Visit

## 2022-01-25 ENCOUNTER — Ambulatory Visit
Admission: RE | Admit: 2022-01-25 | Discharge: 2022-01-25 | Disposition: A | Discharge status: Home / Self Care | Payer: Medicare Other | Source: Ambulatory Visit | Attending: Acute Care | Admitting: Acute Care

## 2022-01-25 DIAGNOSIS — F17210 Nicotine dependence, cigarettes, uncomplicated: Secondary | ICD-10-CM | POA: Diagnosis present

## 2022-01-25 DIAGNOSIS — R911 Solitary pulmonary nodule: Secondary | ICD-10-CM | POA: Insufficient documentation

## 2022-01-25 DIAGNOSIS — Z87891 Personal history of nicotine dependence: Secondary | ICD-10-CM | POA: Insufficient documentation

## 2022-01-27 ENCOUNTER — Telehealth: Payer: Self-pay | Admitting: Acute Care

## 2022-01-27 DIAGNOSIS — R911 Solitary pulmonary nodule: Secondary | ICD-10-CM

## 2022-01-27 NOTE — Telephone Encounter (Signed)
Received call report from [facility] with [facility] on patient's CT Chest LCS Nodule scan done on 01/25/22. Sarah, please review the result/impression copied below:  IMPRESSION: 1. Growing solid nodule of the right lower lobe measuring 11.6 mm. Lung-RADS 4B, suspicious. Additional imaging evaluation or consultation with Pulmonology or Thoracic Surgery recommended. 2. Lower lung predominant subpleural reticular opacities, findings can be seen in the setting of interstitial lung disease. Correlate with pulmonary function tests. 3. Mildly patulous esophagus with air-fluid level, findings can be seen in the setting of gastroesophageal reflux or esophageal dysmotility. 4. Cirrhotic liver morphology with splenomegaly. 5. Aortic Atherosclerosis (ICD10-I70.0) and Emphysema (ICD10-J43.9).    Please advise, thank you.     **Also routing to lung nodule pool as well**

## 2022-01-27 NOTE — Telephone Encounter (Signed)
I have called the patient with the results of their CT scan.  This was a 42-monthfollow-up screening scan.  Initial scan was done in July 2023.  That scan showed a new 5.6 mm right lower lobe nodule and recommendation was for a 662-monthollow-up. Patient had a 6-69-monthllow-up January 25, 2022, which showed the right lower lobe nodule had increased in size from 5.6 mm to 11.6 mm.  As this is a growing nodule plan will be for PET scan after which she will follow-up with Dr. FleRaul Delo [facility] DenLangley Gausshave ordered the PET scan,, please fax results to PCP and let them know plan for care. Thanks so much

## 2022-01-30 NOTE — Telephone Encounter (Signed)
Results/plan faxed to PCP

## 2022-02-13 ENCOUNTER — Ambulatory Visit
Admission: RE | Admit: 2022-02-13 | Discharge: 2022-02-13 | Disposition: A | Discharge status: Home / Self Care | Payer: Medicare Other | Source: Ambulatory Visit | Attending: Acute Care | Admitting: Acute Care

## 2022-02-13 DIAGNOSIS — I2510 Atherosclerotic heart disease of native coronary artery without angina pectoris: Secondary | ICD-10-CM | POA: Insufficient documentation

## 2022-02-13 DIAGNOSIS — Z794 Long term (current) use of insulin: Secondary | ICD-10-CM | POA: Diagnosis not present

## 2022-02-13 DIAGNOSIS — I700 Atherosclerosis of aorta: Secondary | ICD-10-CM | POA: Diagnosis not present

## 2022-02-13 DIAGNOSIS — R911 Solitary pulmonary nodule: Secondary | ICD-10-CM | POA: Insufficient documentation

## 2022-02-13 DIAGNOSIS — E119 Type 2 diabetes mellitus without complications: Secondary | ICD-10-CM | POA: Insufficient documentation

## 2022-02-13 DIAGNOSIS — I517 Cardiomegaly: Secondary | ICD-10-CM | POA: Diagnosis not present

## 2022-02-13 DIAGNOSIS — R161 Splenomegaly, not elsewhere classified: Secondary | ICD-10-CM | POA: Insufficient documentation

## 2022-02-13 DIAGNOSIS — R918 Other nonspecific abnormal finding of lung field: Secondary | ICD-10-CM | POA: Insufficient documentation

## 2022-02-13 DIAGNOSIS — R590 Localized enlarged lymph nodes: Secondary | ICD-10-CM | POA: Diagnosis not present

## 2022-02-13 DIAGNOSIS — R188 Other ascites: Secondary | ICD-10-CM | POA: Insufficient documentation

## 2022-02-13 LAB — GLUCOSE, CAPILLARY: Glucose-Capillary: 136 mg/dL — ABNORMAL HIGH (ref 70–99)

## 2022-02-13 MED ORDER — FLUDEOXYGLUCOSE F - 18 (FDG) INJECTION
8.9000 | Freq: Once | INTRAVENOUS | Status: AC
  Administered 2022-02-13: 9.29 via INTRAVENOUS

## 2022-02-14 ENCOUNTER — Telehealth: Payer: Self-pay | Admitting: Acute Care

## 2022-02-14 NOTE — Telephone Encounter (Signed)
Vanessa Oneill,  Have you had a chance to go over the results for the PET scan yesterday  Please advise

## 2022-02-14 NOTE — Telephone Encounter (Signed)
pt called to get result of PET scan from 02/13/22. Advise pt that providers has up to 3 days to review results and they may not have them yet so thats why no one has called to deliver the results. She has appt scheduled for 2/16 to review results however she wanted to get someone to call her sooner.  [phone]

## 2022-02-15 ENCOUNTER — Telehealth: Payer: Self-pay | Admitting: Acute Care

## 2022-02-15 DIAGNOSIS — Z87891 Personal history of nicotine dependence: Secondary | ICD-10-CM

## 2022-02-15 DIAGNOSIS — R911 Solitary pulmonary nodule: Secondary | ICD-10-CM

## 2022-02-15 NOTE — Telephone Encounter (Signed)
87morecall with Dr. GPatsey Bertholdplaced, as her schedule is not out for May.

## 2022-02-15 NOTE — Telephone Encounter (Signed)
Order placed for 3 months CT chest wo contrast.  OV to be scheduled to review with Dr. Patsey Berthold

## 2022-02-15 NOTE — Telephone Encounter (Signed)
I have called the patient with the results of her PET scan. I explained that her scan  Was read as having some low level uptake in the 11.6 mm  LLL nodule and the 2.5 cm peripheral subsolid lesion with multiple air cysts in the left lower lobe. Additionally there was notation of  a Small focus of hypermetabolism in the proximal rectum with SUV max = 5.9.  I have reviewed the scan with Dr. Patsey Berthold. Plan is for a 3 month follow up to re-evaluate nodules short term. There is recommendation that she has a colonoscopy to better evaluate the Small focus of hypermetabolism in the proximal rectum . She states she is followed by [facility]un[facility] Cory for GI, and that she is most likely due for a colonoscopy. I have asked her to follow up with her GI provider to have this better assessed. She verbalized understanding of the above, and had no further questions upon completion of the call. I explained that she will get a call to schedule her 3 month follow up Ct chest without. Langley Gauss, please place on tickle list as this 3 month follow up will not fall onto the dashboard. She will need an appointment with Dr. Darnell Level. In the [place].  3 month follow up CT Chest without Follow up with GI provider.   Dr. Caryl Comes sent to you for results report and also for follow up with GI. Thanks so much.  Thanks so much     11.6 mm left lower lobe pulmonary nodule of concern on recent lung cancer screening chest CT shows low level FDG accumulation. Low-grade neoplasm not excluded. 2. 2.5 cm peripheral subsolid lesion with multiple air cysts in the left lower lobe shows low level hypermetabolism. This may be infectious/inflammatory although low-grade neoplasm not excluded. Continued close follow-up warranted. 3. Small focus of hypermetabolism in the proximal rectum with SUV max = 5.9. No other similar FDG accumulation in the colon. CT imaging shows no discernible soft tissue mass at this location. Correlation with colorectal Cancer  screening history recommended. 4. Hepatic cirrhosis with splenomegaly suggesting associated portal venous hypertension. 5. Trace ascites adjacent to the liver. 6.  Aortic Atherosclerosis (ICD10-I70.0).

## 2022-02-17 ENCOUNTER — Ambulatory Visit: Payer: No Typology Code available for payment source | Admitting: Acute Care

## 2022-02-22 NOTE — Telephone Encounter (Signed)
Vanessa Oneill, please see Dr Gust Brooms ov note from 02/17/22  with the following impression:  Impression: Right lower lung 11. 6 mm nodule, rapidly growing, almost doubled in size in 6-7 months. In a smoker concern about a malignancy. We discussed bx and go from there vs going straight to resection. She has chosen the later. Hence will refer to t-surg. Emperic radiation was also discussed. Her fev 1 today 1.66 liters ( she is a surgical candidate) -will also continue albuterol -trial of trelegy 100 one puff q day -stop smoking -follow up in 4 weeks

## 2022-02-22 NOTE — Telephone Encounter (Signed)
Noted  

## 2022-03-08 ENCOUNTER — Institutional Professional Consult (permissible substitution): Payer: Medicare Other | Admitting: Thoracic Surgery (Cardiothoracic Vascular Surgery)

## 2022-03-08 VITALS — BP 149/69 | HR 65 | Resp 20 | Ht 67.0 in | Wt 175.0 lb

## 2022-03-08 DIAGNOSIS — R911 Solitary pulmonary nodule: Secondary | ICD-10-CM

## 2022-03-08 NOTE — H&P (View-Only) (Signed)
PCP is Klein, Bert J III, MD Referring Provider is Fleming, Herbon E, MD  Chief Complaint  Patient presents with   Lung Lesion    Surgical consult, Chest CT 01/25/22/ PET Scan 02/13/22/ PFT's 02/17/22    HPI: Vanessa Oneill is sent for consultation regarding a right lower lobe lung nodule.  Vanessa Oneill is a 71-year-old woman with a history of ongoing tobacco abuse, COPD, pulmonary fibrosis, hypertension, hyperlipidemia, mitral regurgitation, aortic and coronary atherosclerosis, cerebral aneurysm, sleep apnea, Raynaud's, reflux, type 2 diabetes without complication, colon adenomas, anxiety and depression.  She has smoked about a pack a day for 55 years.  She had a low-dose CT for lung cancer screening back in July which showed a cystic abnormality in the left lower lobe and a 6 mm right lower lobe lung nodule.  A follow-up scan in 6 months was done in January.  It showed no change in the left lower lobe cystic lesion.  However the right lower lobe nodule was dramatically larger at 11.6 mm in diameter.  She had some calcified hilar mediastinal adenopathy but no pathologically enlarged lymph nodes.  PET/CT showed the right lower lobe nodule was hypermetabolic with an SUV of 2.5.  There also was hypermetabolic activity in the cystic lesion on the left.  There is no mediastinal or hilar activity.  There was an area of increased activity in the rectum.  She did call with GI re: the finding in the rectum on CT, but was given an appointment in June.  She gets short of breath with exertion.  She feels like she can walk up a flight of stairs but would be short of breath.  She thinks she could do it without stopping but has not tried recently.  She has a frequent productive cough and wheezing.  She uses her albuterol inhaler about twice a day.  She had to stop Trelegy because it made her cough worse.  She has frequent heartburn but no exertional chest pain, pressure, or tightness.  She does complain of  decreased energy.  She has had a poor appetite and has lost about 5 pounds over the past 3 months.  Zubrod Score: At the time of surgery this patient's most appropriate activity status/level should be described as: []    0    Normal activity, no symptoms [x]    1    Restricted in physical strenuous activity but ambulatory, able to do out light work []    2    Ambulatory and capable of self care, unable to do work activities, up and about >50 % of waking hours                              []    3    Only limited self care, in bed greater than 50% of waking hours []    4    Completely disabled, no self care, confined to bed or chair []    5    Moribund   Past Medical History:  Diagnosis Date   Acid reflux    Anxiety    Aortic atherosclerosis (HCC)    Arthritis    Cerebral aneurysm    Colon adenomas    COPD (chronic obstructive pulmonary disease) (HCC)    Coronary artery disease    Depression    Diabetes mellitus without complication (HCC)    ESBL (extended spectrum beta-lactamase) producing bacteria infection      Headache    Hyperlipidemia    Hypertension    Liver disease    Lumbago    Mitral valve regurgitation    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HCC)    Personal history of tobacco use, presenting hazards to health 11/12/2014   Raynaud's disease    Sleep apnea     Past Surgical History:  Procedure Laterality Date   ABDOMINAL HYSTERECTOMY     APPENDECTOMY     CHOLECYSTECTOMY     COLONOSCOPY WITH PROPOFOL N/A 11/18/2018   Procedure: COLONOSCOPY WITH PROPOFOL;  Surgeon: Toledo, Teodoro K, MD;  Location: [facility];  Service: Gastroenterology;  Laterality: N/A;   ESOPHAGOGASTRODUODENOSCOPY (EGD) WITH PROPOFOL N/A 07/24/2016   Procedure: ESOPHAGOGASTRODUODENOSCOPY (EGD) WITH PROPOFOL;  Surgeon: Anna, Kiran, MD;  Location: [facility];  Service: Endoscopy;  Laterality: N/A;   ESOPHAGOGASTRODUODENOSCOPY (EGD) WITH PROPOFOL N/A 11/21/2016   Procedure: ESOPHAGOGASTRODUODENOSCOPY  (EGD) WITH PROPOFOL;  Surgeon: Toledo, Teodoro K, MD;  Location: [facility];  Service: Gastroenterology;  Laterality: N/A;   ESOPHAGOGASTRODUODENOSCOPY (EGD) WITH PROPOFOL N/A 02/05/2018   Procedure: ESOPHAGOGASTRODUODENOSCOPY (EGD) WITH PROPOFOL;  Surgeon: Toledo, Teodoro K, MD;  Location: [facility];  Service: Endoscopy;  Laterality: N/A;   TONSILLECTOMY      Family History  Problem Relation Age of Onset   Lung cancer Father    Heart Problems Father    Heart failure Mother    Breast cancer Neg Hx     Social History Social History   Tobacco Use   Smoking status: Every Day    Packs/day: 1.00    Years: 48.00    Total pack years: 48.00    Types: Cigarettes   Smokeless tobacco: Never   Tobacco comments:    patient refused  Vaping Use   Vaping Use: Never used  Substance Use Topics   Alcohol use: No    Alcohol/week: 0.0 standard drinks of alcohol   Drug use: No    Current Outpatient Medications  Medication Sig Dispense Refill   albuterol (VENTOLIN HFA) 108 (90 Base) MCG/ACT inhaler Inhale into the lungs every 6 (six) hours as needed for wheezing or shortness of breath.     ALPRAZolam (XANAX) 0.25 MG tablet Take 0.25 mg by mouth at bedtime as needed for sleep.  5   amLODipine (NORVASC) 2.5 MG tablet Take 2.5 mg by mouth daily.     azelastine (ASTELIN) 0.1 % nasal spray Place into both nostrils 2 (two) times daily. Use in each nostril as directed     busPIRone (BUSPAR) 7.5 MG tablet Take 7.5 mg by mouth 2 (two) times daily.     chlorhexidine (HIBICLENS) 4 % external liquid Apply topically daily as needed. Dilute 10-15 mL in water, Use daily when bathing for 1-2 weeks 120 mL 0   colestipol (COLESTID) 1 g tablet Take 1 g by mouth 2 (two) times daily.     cyclobenzaprine (FLEXERIL) 10 MG tablet Take 10 mg by mouth 3 (three) times daily as needed for muscle spasms.     erythromycin ophthalmic ointment Place a 1/2 inch ribbon of ointment into the lower eyelid. 3.5 g 0    gabapentin (NEURONTIN) 100 MG capsule Take 1 capsule by mouth 2 (two) times daily.     glimepiride (AMARYL) 4 MG tablet Take 4 mg by mouth daily with breakfast.     HYDROcodone-acetaminophen (NORCO/VICODIN) 5-325 MG tablet Take 1 tablet by mouth every 8 (eight) hours as needed for moderate pain.      insulin NPH Human (HUMULIN N,NOVOLIN N) 100   UNIT/ML injection Inject 0.1 mLs (10 Units total) into the skin 2 (two) times daily before a meal. Use 30 units in the morning, 20 units during the day and 30 units at night. 10 mL 11   meloxicam (MOBIC) 15 MG tablet Take 15 mg by mouth daily as needed for pain.     nadolol (CORGARD) 80 MG tablet Take 80 mg by mouth daily.     pantoprazole (PROTONIX) 40 MG tablet Take 40 mg by mouth 2 (two) times daily.      traZODone (DESYREL) 50 MG tablet Take 50 mg by mouth at bedtime.  0   No current facility-administered medications for this visit.    Allergies  Allergen Reactions   Codeine    Penicillins    Tylenol With Codeine #3 [Acetaminophen-Codeine]    Amoxicillin Itching and Rash   Lipitor [Atorvastatin] Other (See Comments)    Muscle Pain   Lisinopril Cough    Review of Systems  Constitutional:  Positive for activity change, appetite change, fatigue and unexpected weight change.  HENT:  Positive for dental problem (Dentures). Negative for trouble swallowing and voice change.   Respiratory:  Positive for cough, shortness of breath and wheezing.   Cardiovascular:  Negative for chest pain.  Gastrointestinal:  Positive for abdominal pain (Reflux).  Musculoskeletal:  Positive for arthralgias (Right hip and back) and back pain.  Hematological:  Negative for adenopathy. Does not bruise/bleed easily.  Psychiatric/Behavioral:  Positive for dysphoric mood. The patient is nervous/anxious.   All other systems reviewed and are negative.   BP (!) 149/69   Pulse 65   Resp 20   Ht 5' 7" (1.702 m)   Wt 175 lb (79.4 kg)   SpO2 92% Comment: RA  BMI 27.41  kg/m  Physical Exam Vitals reviewed.  Constitutional:      General: She is not in acute distress.    Appearance: Normal appearance.  HENT:     Head: Normocephalic and atraumatic.  Eyes:     General: No scleral icterus.    Extraocular Movements: Extraocular movements intact.  Neck:     Vascular: No carotid bruit.  Cardiovascular:     Rate and Rhythm: Normal rate and regular rhythm.     Heart sounds: Murmur (2/6 systolic) heard.  Pulmonary:     Effort: Pulmonary effort is normal. No respiratory distress.     Breath sounds: No wheezing.     Comments: Diminished breath sounds bilaterally, faint crackles at bases Abdominal:     General: There is no distension.     Palpations: Abdomen is soft.  Musculoskeletal:     Cervical back: Neck supple.  Lymphadenopathy:     Cervical: No cervical adenopathy.  Skin:    General: Skin is warm and dry.  Neurological:     General: No focal deficit present.     Mental Status: She is alert and oriented to person, place, and time.     Cranial Nerves: No cranial nerve deficit.     Motor: No weakness.    Diagnostic Tests: NUCLEAR MEDICINE PET SKULL BASE TO THIGH   TECHNIQUE: 9.3 mCi F-18 FDG was injected intravenously. Full-ring PET imaging was performed from the skull base to thigh after the radiotracer. CT data was obtained and used for attenuation correction and anatomic localization.   Fasting blood glucose: 136 mg/dl   COMPARISON:  lung cancer screening chest CT 01/25/2022   FINDINGS: Mediastinal blood pool activity: SUV max 1.7   Liver activity: SUV max   NA   NECK: No hypermetabolic lymph nodes in the neck.   Incidental CT findings: None.   CHEST: No hypermetabolic mediastinal or hilar nodes.   11.6 mm nodule of concern on recent lung cancer screening chest CT is identified on image 107/2 today. Low level FDG accumulation is identified within the lesion with SUV max = 2.5.   Low level hypermetabolism associated with 2.5 cm  peripheral subsolid lesion with multiple air cysts (left lower lobe image 98/2). SUV max = 3.2   Incidental CT findings: Heart is enlarged. No substantial pericardial effusion. Coronary artery calcification is evident. Mild atherosclerotic calcification is noted in the wall of the thoracic aorta. Calcified lymph nodes are seen in the mediastinum and right hilum compatible with old granulomatous disease.   ABDOMEN/PELVIS: No abnormal hypermetabolic activity within the liver, pancreas, adrenal glands, or spleen. No hypermetabolic lymph nodes in the abdomen or pelvis.   Small focus of mild hypermetabolism identified in the proximal rectum with SUV max = 5.9. No other similar FDG accumulation in the colon. CT imaging shows no discernible soft tissue mass at this location (image 212/2).   Incidental CT findings: Nodular hepatic contour is consistent with cirrhosis. Splenomegaly suggests associated portal venous hypertension. Trace ascites noted adjacent to the liver. There is moderate atherosclerotic calcification of the abdominal aorta without aneurysm. Cortical scarring noted left kidney. Hysterectomy.   SKELETON: No focal hypermetabolic activity to suggest skeletal metastasis.   Incidental CT findings: No worrisome lytic or sclerotic osseous abnormality.   IMPRESSION: 1. 11.6 mm left lower lobe pulmonary nodule of concern on recent lung cancer screening chest CT shows low level FDG accumulation. Low-grade neoplasm not excluded. 2. 2.5 cm peripheral subsolid lesion with multiple air cysts in the left lower lobe shows low level hypermetabolism. This may be infectious/inflammatory although low-grade neoplasm not excluded. Continued close follow-up warranted. 3. Small focus of hypermetabolism in the proximal rectum with SUV max = 5.9. No other similar FDG accumulation in the colon. CT imaging shows no discernible soft tissue mass at this location. Correlation with colorectal  Cancer screening history recommended. 4. Hepatic cirrhosis with splenomegaly suggesting associated portal venous hypertension. 5. Trace ascites adjacent to the liver. 6.  Aortic Atherosclerosis (ICD10-I70.0).   Hypermetabolism     Electronically Signed   By: Eric  Mansell M.D.   On: 02/14/2022 08:43  I personally reviewed the PET/CT images.  Marked and enlargement of right lower lobe nodule with metabolic activity highly suspicious for new primary bronchogenic carcinoma.  Calcified hilar and mediastinal nodes.  No hypermetabolism.  Hypermetabolic activity and cystic lesion in the lateral left lower lobe.  Pulmonary function testing (from Dr. Fleming's notes) FVC 2.02 (64% of predicted) FEV1 1.66 (68% of predicted) FEV1 decreased dramatically with bronchodilators, likely due to coughing. DLCO 40% of predicted  Impression: Vanessa Oneill is a 71-year-old woman with a history of ongoing tobacco abuse, COPD, pulmonary fibrosis, hypertension, hyperlipidemia, mitral regurgitation, aortic and coronary atherosclerosis, cerebral aneurysm, sleep apnea, Raynaud's, reflux, type 2 diabetes without complication, colon adenomas, anxiety and depression.   Right lower lobe lung nodule-dramatic increase in size from July until present.  From 6 mm diameter to 11.6 mm in diameter (roughly an 8 times increase in size volumetrically).  Hypermetabolic on PET.  Given her age, smoking history, appearance of the nodule, and progression, this almost certainly is a new primary bronchogenic carcinoma.  Infectious and inflammatory nodules are in the differential diagnosis.  She does have some evidence of some old granulomatous   disease.  However, for more likely to be a lung cancer than a granuloma.  We discussed treatment options for the lung nodule.  Based on her scan she has clinical stage Ia disease (T1, N0).  This nodule would be amenable to either surgical resection or stereotactic radiation.  We discussed the  relative advantages and disadvantages of each of those approaches.  She understands that surgery is the gold standard treatment.  Although her FEV1 is 1.66, she would be a marginal candidate for a lobectomy with a diffusion capacity of only 40%.  She did not desaturate on her 6-minute walk test but did only go 960 feet.  Fortunately this nodule is peripheral and is amenable to a basilar segmentectomy, which should give equivalent outcomes.    I described the postoperative procedure to her.  The plan would be to do a robotic assisted right lower lobe basilar segmentectomy.  The general nature of the procedure including the need for general anesthesia, the incisions to be used, surgical robot, the use of a drainage tube postoperatively, the expected hospital stay, and the overall recovery were discussed in detail with the patient and her son.  I informed her of the indications, risks, benefits, and alternatives.  She understands the risks include, but are not limited to death, MI, DVT, PE, bleeding, possible need for transfusion, infection, prolonged air leak, cardiac arrhythmias, conversion to open procedure, as well as possibility of other unforeseeable complications.  She is leaning to surgery but wants some time to think it over before she makes a final decision.  Tobacco abuse-longstanding smoker.  Does not want to quit.  Emphasized the importance of tobacco cessation for outcome from surgery, long-term outcome from lung cancer, development of additional lung cancers, progression of pulmonary fibrosis, progression of coronary and aortic atherosclerosis.  Cystic lesion left lower lobe-no significant change in size or appearance on sequential CTs but does have metabolic activity so needs to be observed closely.  Hypermetabolic activity in rectum-unfortunately she does not have an appointment with GI for several months.  Definitely needs to be investigated but will proceed with treatment of her lung  nodule in the meantime.  Plan: She will call to let us know if she would like to proceed with robotic right lower lobe basilar segmentectomy.  Buna Cuppett C Tonatiuh Mallon, MD [facility] [phone]  

## 2022-03-08 NOTE — Progress Notes (Signed)
PCP is Adin Hector, MD Referring Provider is Erby Pian, MD  Chief Complaint  Patient presents with   Lung Lesion    Surgical consult, Chest CT 01/25/22/ PET Scan 02/13/22/ PFT's 02/17/22    HPI: Mrs. Lehl is sent for consultation regarding a right lower lobe lung nodule.  Vanessa Oneill is a 72 year old woman with a history of ongoing tobacco abuse, COPD, pulmonary fibrosis, hypertension, hyperlipidemia, mitral regurgitation, aortic and coronary atherosclerosis, cerebral aneurysm, sleep apnea, Raynaud's, reflux, type 2 diabetes without complication, colon adenomas, anxiety and depression.  She has smoked about a pack a day for 55 years.  She had a low-dose CT for lung cancer screening back in July which showed a cystic abnormality in the left lower lobe and a 6 mm right lower lobe lung nodule.  A follow-up scan in 6 months was done in January.  It showed no change in the left lower lobe cystic lesion.  However the right lower lobe nodule was dramatically larger at 11.6 mm in diameter.  She had some calcified hilar mediastinal adenopathy but no pathologically enlarged lymph nodes.  PET/CT showed the right lower lobe nodule was hypermetabolic with an SUV of 2.5.  There also was hypermetabolic activity in the cystic lesion on the left.  There is no mediastinal or hilar activity.  There was an area of increased activity in the rectum.  She did call with GI re: the finding in the rectum on CT, but was given an appointment in June.  She gets short of breath with exertion.  She feels like she can walk up a flight of stairs but would be short of breath.  She thinks she could do it without stopping but has not tried recently.  She has a frequent productive cough and wheezing.  She uses her albuterol inhaler about twice a day.  She had to stop Trelegy because it made her cough worse.  She has frequent heartburn but no exertional chest pain, pressure, or tightness.  She does complain of  decreased energy.  She has had a poor appetite and has lost about 5 pounds over the past 3 months.  Zubrod Score: At the time of surgery this patient's most appropriate activity status/level should be described as: '[]'$     0    Normal activity, no symptoms '[x]'$     1    Restricted in physical strenuous activity but ambulatory, able to do out light work '[]'$     2    Ambulatory and capable of self care, unable to do work activities, up and about >50 % of waking hours                              '[]'$     3    Only limited self care, in bed greater than 50% of waking hours '[]'$     4    Completely disabled, no self care, confined to bed or chair '[]'$     5    Moribund   Past Medical History:  Diagnosis Date   Acid reflux    Anxiety    Aortic atherosclerosis (HCC)    Arthritis    Cerebral aneurysm    Colon adenomas    COPD (chronic obstructive pulmonary disease) ([place])    Coronary artery disease    Depression    Diabetes mellitus without complication ([place])    ESBL (extended spectrum beta-lactamase) producing bacteria infection  Headache    Hyperlipidemia    Hypertension    Liver disease    Lumbago    Mitral valve regurgitation    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[place])    Personal history of tobacco use, presenting hazards to health 11/12/2014   Raynaud's disease    Sleep apnea     Past Surgical History:  Procedure Laterality Date   ABDOMINAL HYSTERECTOMY     APPENDECTOMY     CHOLECYSTECTOMY     COLONOSCOPY WITH PROPOFOL N/A 11/18/2018   Procedure: COLONOSCOPY WITH PROPOFOL;  Surgeon: Toledo, Benay Pike, MD;  Location: [facility] ENDOSCOPY;  Service: Gastroenterology;  Laterality: N/A;   ESOPHAGOGASTRODUODENOSCOPY (EGD) WITH PROPOFOL N/A 07/24/2016   Procedure: ESOPHAGOGASTRODUODENOSCOPY (EGD) WITH PROPOFOL;  Surgeon: Jonathon Bellows, MD;  Location: [facility] ENDOSCOPY;  Service: Endoscopy;  Laterality: N/A;   ESOPHAGOGASTRODUODENOSCOPY (EGD) WITH PROPOFOL N/A 11/21/2016   Procedure: ESOPHAGOGASTRODUODENOSCOPY  (EGD) WITH PROPOFOL;  Surgeon: Toledo, Benay Pike, MD;  Location: [facility] ENDOSCOPY;  Service: Gastroenterology;  Laterality: N/A;   ESOPHAGOGASTRODUODENOSCOPY (EGD) WITH PROPOFOL N/A 02/05/2018   Procedure: ESOPHAGOGASTRODUODENOSCOPY (EGD) WITH PROPOFOL;  Surgeon: Toledo, Benay Pike, MD;  Location: [facility] ENDOSCOPY;  Service: Endoscopy;  Laterality: N/A;   TONSILLECTOMY      Family History  Problem Relation Age of Onset   Lung cancer Father    Heart Problems Father    Heart failure Mother    Breast cancer Neg Hx     Social History Social History   Tobacco Use   Smoking status: Every Day    Packs/day: 1.00    Years: 48.00    Total pack years: 48.00    Types: Cigarettes   Smokeless tobacco: Never   Tobacco comments:    patient refused  Vaping Use   Vaping Use: Never used  Substance Use Topics   Alcohol use: No    Alcohol/week: 0.0 standard drinks of alcohol   Drug use: No    Current Outpatient Medications  Medication Sig Dispense Refill   albuterol (VENTOLIN HFA) 108 (90 Base) MCG/ACT inhaler Inhale into the lungs every 6 (six) hours as needed for wheezing or shortness of breath.     ALPRAZolam (XANAX) 0.25 MG tablet Take 0.25 mg by mouth at bedtime as needed for sleep.  5   amLODipine (NORVASC) 2.5 MG tablet Take 2.5 mg by mouth daily.     azelastine (ASTELIN) 0.1 % nasal spray Place into both nostrils 2 (two) times daily. Use in each nostril as directed     busPIRone (BUSPAR) 7.5 MG tablet Take 7.5 mg by mouth 2 (two) times daily.     chlorhexidine (HIBICLENS) 4 % external liquid Apply topically daily as needed. Dilute 10-15 mL in water, Use daily when bathing for 1-2 weeks 120 mL 0   colestipol (COLESTID) 1 g tablet Take 1 g by mouth 2 (two) times daily.     cyclobenzaprine (FLEXERIL) 10 MG tablet Take 10 mg by mouth 3 (three) times daily as needed for muscle spasms.     erythromycin ophthalmic ointment Place a 1/2 inch ribbon of ointment into the lower eyelid. 3.5 g 0    gabapentin (NEURONTIN) 100 MG capsule Take 1 capsule by mouth 2 (two) times daily.     glimepiride (AMARYL) 4 MG tablet Take 4 mg by mouth daily with breakfast.     HYDROcodone-acetaminophen (NORCO/VICODIN) 5-325 MG tablet Take 1 tablet by mouth every 8 (eight) hours as needed for moderate pain.      insulin NPH Human (HUMULIN N,NOVOLIN N) 100  UNIT/ML injection Inject 0.1 mLs (10 Units total) into the skin 2 (two) times daily before a meal. Use 30 units in the morning, 20 units during the day and 30 units at night. 10 mL 11   meloxicam (MOBIC) 15 MG tablet Take 15 mg by mouth daily as needed for pain.     nadolol (CORGARD) 80 MG tablet Take 80 mg by mouth daily.     pantoprazole (PROTONIX) 40 MG tablet Take 40 mg by mouth 2 (two) times daily.      traZODone (DESYREL) 50 MG tablet Take 50 mg by mouth at bedtime.  0   No current facility-administered medications for this visit.    Allergies  Allergen Reactions   Codeine    Penicillins    Tylenol With Codeine #3 [Acetaminophen-Codeine]    Amoxicillin Itching and Rash   Lipitor [Atorvastatin] Other (See Comments)    Muscle Pain   Lisinopril Cough    Review of Systems  Constitutional:  Positive for activity change, appetite change, fatigue and unexpected weight change.  HENT:  Positive for dental problem (Dentures). Negative for trouble swallowing and voice change.   Respiratory:  Positive for cough, shortness of breath and wheezing.   Cardiovascular:  Negative for chest pain.  Gastrointestinal:  Positive for abdominal pain (Reflux).  Musculoskeletal:  Positive for arthralgias (Right hip and back) and back pain.  Hematological:  Negative for adenopathy. Does not bruise/bleed easily.  Psychiatric/Behavioral:  Positive for dysphoric mood. The patient is nervous/anxious.   All other systems reviewed and are negative.   BP (!) 149/69   Pulse 65   Resp 20   Ht '5\' 7"'$  (1.702 m)   Wt 175 lb (79.4 kg)   SpO2 92% Comment: RA  BMI 27.41  kg/m  Physical Exam Vitals reviewed.  Constitutional:      General: She is not in acute distress.    Appearance: Normal appearance.  HENT:     Head: Normocephalic and atraumatic.  Eyes:     General: No scleral icterus.    Extraocular Movements: Extraocular movements intact.  Neck:     Vascular: No carotid bruit.  Cardiovascular:     Rate and Rhythm: Normal rate and regular rhythm.     Heart sounds: Murmur (2/6 systolic) heard.  Pulmonary:     Effort: Pulmonary effort is normal. No respiratory distress.     Breath sounds: No wheezing.     Comments: Diminished breath sounds bilaterally, faint crackles at bases Abdominal:     General: There is no distension.     Palpations: Abdomen is soft.  Musculoskeletal:     Cervical back: Neck supple.  Lymphadenopathy:     Cervical: No cervical adenopathy.  Skin:    General: Skin is warm and dry.  Neurological:     General: No focal deficit present.     Mental Status: She is alert and oriented to person, place, and time.     Cranial Nerves: No cranial nerve deficit.     Motor: No weakness.    Diagnostic Tests: NUCLEAR MEDICINE PET SKULL BASE TO THIGH   TECHNIQUE: 9.3 mCi F-18 FDG was injected intravenously. Full-ring PET imaging was performed from the skull base to thigh after the radiotracer. CT data was obtained and used for attenuation correction and anatomic localization.   Fasting blood glucose: 136 mg/dl   COMPARISON:  lung cancer screening chest CT 01/25/2022   FINDINGS: Mediastinal blood pool activity: SUV max 1.7   Liver activity: SUV max  NA   NECK: No hypermetabolic lymph nodes in the neck.   Incidental CT findings: None.   CHEST: No hypermetabolic mediastinal or hilar nodes.   11.6 mm nodule of concern on recent lung cancer screening chest CT is identified on image 107/2 today. Low level FDG accumulation is identified within the lesion with SUV max = 2.5.   Low level hypermetabolism associated with 2.5 cm  peripheral subsolid lesion with multiple air cysts (left lower lobe image 98/2). SUV max = 3.2   Incidental CT findings: Heart is enlarged. No substantial pericardial effusion. Coronary artery calcification is evident. Mild atherosclerotic calcification is noted in the wall of the thoracic aorta. Calcified lymph nodes are seen in the mediastinum and right hilum compatible with old granulomatous disease.   ABDOMEN/PELVIS: No abnormal hypermetabolic activity within the liver, pancreas, adrenal glands, or spleen. No hypermetabolic lymph nodes in the abdomen or pelvis.   Small focus of mild hypermetabolism identified in the proximal rectum with SUV max = 5.9. No other similar FDG accumulation in the colon. CT imaging shows no discernible soft tissue mass at this location (image 212/2).   Incidental CT findings: Nodular hepatic contour is consistent with cirrhosis. Splenomegaly suggests associated portal venous hypertension. Trace ascites noted adjacent to the liver. There is moderate atherosclerotic calcification of the abdominal aorta without aneurysm. Cortical scarring noted left kidney. Hysterectomy.   SKELETON: No focal hypermetabolic activity to suggest skeletal metastasis.   Incidental CT findings: No worrisome lytic or sclerotic osseous abnormality.   IMPRESSION: 1. 11.6 mm left lower lobe pulmonary nodule of concern on recent lung cancer screening chest CT shows low level FDG accumulation. Low-grade neoplasm not excluded. 2. 2.5 cm peripheral subsolid lesion with multiple air cysts in the left lower lobe shows low level hypermetabolism. This may be infectious/inflammatory although low-grade neoplasm not excluded. Continued close follow-up warranted. 3. Small focus of hypermetabolism in the proximal rectum with SUV max = 5.9. No other similar FDG accumulation in the colon. CT imaging shows no discernible soft tissue mass at this location. Correlation with colorectal  Cancer screening history recommended. 4. Hepatic cirrhosis with splenomegaly suggesting associated portal venous hypertension. 5. Trace ascites adjacent to the liver. 6.  Aortic Atherosclerosis (ICD10-I70.0).   Hypermetabolism     Electronically Signed   By: Misty Stanley M.D.   On: 02/14/2022 08:43  I personally reviewed the PET/CT images.  Marked and enlargement of right lower lobe nodule with metabolic activity highly suspicious for new primary bronchogenic carcinoma.  Calcified hilar and mediastinal nodes.  No hypermetabolism.  Hypermetabolic activity and cystic lesion in the lateral left lower lobe.  Pulmonary function testing (from Dr. Gust Brooms notes) FVC 2.02 (64% of predicted) FEV1 1.66 (68% of predicted) FEV1 decreased dramatically with bronchodilators, likely due to coughing. DLCO 40% of predicted  Impression: Vanessa Oneill is a 72 year old woman with a history of ongoing tobacco abuse, COPD, pulmonary fibrosis, hypertension, hyperlipidemia, mitral regurgitation, aortic and coronary atherosclerosis, cerebral aneurysm, sleep apnea, Raynaud's, reflux, type 2 diabetes without complication, colon adenomas, anxiety and depression.   Right lower lobe lung nodule-dramatic increase in size from July until present.  From 6 mm diameter to 11.6 mm in diameter (roughly an 8 times increase in size volumetrically).  Hypermetabolic on PET.  Given her age, smoking history, appearance of the nodule, and progression, this almost certainly is a new primary bronchogenic carcinoma.  Infectious and inflammatory nodules are in the differential diagnosis.  She does have some evidence of some old granulomatous  disease.  However, for more likely to be a lung cancer than a granuloma.  We discussed treatment options for the lung nodule.  Based on her scan she has clinical stage Ia disease (T1, N0).  This nodule would be amenable to either surgical resection or stereotactic radiation.  We discussed the  relative advantages and disadvantages of each of those approaches.  She understands that surgery is the gold standard treatment.  Although her FEV1 is 1.66, she would be a marginal candidate for a lobectomy with a diffusion capacity of only 40%.  She did not desaturate on her 6-minute walk test but did only go 960 feet.  Fortunately this nodule is peripheral and is amenable to a basilar segmentectomy, which should give equivalent outcomes.    I described the postoperative procedure to her.  The plan would be to do a robotic assisted right lower lobe basilar segmentectomy.  The general nature of the procedure including the need for general anesthesia, the incisions to be used, surgical robot, the use of a drainage tube postoperatively, the expected hospital stay, and the overall recovery were discussed in detail with the patient and her son.  I informed her of the indications, risks, benefits, and alternatives.  She understands the risks include, but are not limited to death, MI, DVT, PE, bleeding, possible need for transfusion, infection, prolonged air leak, cardiac arrhythmias, conversion to open procedure, as well as possibility of other unforeseeable complications.  She is leaning to surgery but wants some time to think it over before she makes a final decision.  Tobacco abuse-longstanding smoker.  Does not want to quit.  Emphasized the importance of tobacco cessation for outcome from surgery, long-term outcome from lung cancer, development of additional lung cancers, progression of pulmonary fibrosis, progression of coronary and aortic atherosclerosis.  Cystic lesion left lower lobe-no significant change in size or appearance on sequential CTs but does have metabolic activity so needs to be observed closely.  Hypermetabolic activity in rectum-unfortunately she does not have an appointment with GI for several months.  Definitely needs to be investigated but will proceed with treatment of her lung  nodule in the meantime.  Plan: She will call to let us know if she would like to proceed with robotic right lower lobe basilar segmentectomy.  Melrose Nakayama, MD [facility] [phone]

## 2022-03-08 NOTE — Progress Notes (Signed)
6 Minute Walk Test Results  Patient: Vanessa Oneill Date:  03/08/2022   Supplemental O2 during test? no      Baseline   End  Time   1655    1701 Heartrate  65    71 Dyspnea  no    no Fatigue  no    no O2 sat   92%    90% Blood pressure 149/69    169/75   Patient ambulated at a steady pace for a total distance of 960 feet with 0 stops.  Ambulation was limited primarily due to n/a  Overall the test was tolerated well

## 2022-03-09 ENCOUNTER — Other Ambulatory Visit: Payer: Self-pay | Admitting: *Deleted

## 2022-03-09 ENCOUNTER — Encounter: Payer: Self-pay | Admitting: *Deleted

## 2022-03-09 DIAGNOSIS — R911 Solitary pulmonary nodule: Secondary | ICD-10-CM

## 2022-03-20 NOTE — Pre-Procedure Instructions (Signed)
Surgical Instructions    Your procedure is scheduled on March 23, 2022.  Report to [facility] Main Entrance "A" at 06:00 A.M., then check in with the Admitting office.  Call this number if you have problems the morning of surgery:  [phone]   If you have any questions prior to your surgery date call [phone]: Open Monday-Friday 8am-4pm If you experience any cold or flu symptoms such as cough, fever, chills, shortness of breath, etc. between now and your scheduled surgery, please notify us at the above number     Remember:  Do not eat or drink after midnight the night before your surgery    Take these medicines the morning of surgery with A SIP OF WATER:  Colestipol (Colestid) Gabapentin (Neurontin) Ipratropium (Atrovent) nasal spray Omeprazole (Prilosec)  If needed: Albuterol (Ventolin HFA) Alprazolam (Xanax) Cyclobenzaprine (Flexeril) Hydrocodone-Acetaminophen (NORCO)   As of today, STOP taking any Aspirin (unless otherwise instructed by your surgeon) Aleve, Naproxen, Ibuprofen, Motrin, Advil, Goody's, BC's, all herbal medications, fish oil, and all vitamins.  WHAT DO I DO ABOUT MY DIABETES MEDICATION?   Do not take oral diabetes medicines (pills) the morning of surgery DO NOT TAKE YOUR GLIMEPIRIDE (AMARYL) OR METFORMIN (GLUCOPHAGE-XR) the morning of surgery.  THE NIGHT BEFORE SURGERY, take a HALF DOSE (15 units) of your Insulin NPH Human (Humulin N, Novolin N) if your CBG is 160 or above.   THE MORNING OF SURGERY, take a HALF DOSE (15 units) of your Insulin NPH Human (Humulin N, Novolin N) if your CBG is 160 or above.   HOW TO MANAGE YOUR DIABETES BEFORE AND AFTER SURGERY  Why is it important to control my blood sugar before and after surgery? Improving blood sugar levels before and after surgery helps healing and can limit problems. A way of improving blood sugar control is eating a healthy diet by:  Eating less sugar and carbohydrates  Increasing  activity/exercise  Talking with your doctor about reaching your blood sugar goals High blood sugars (greater than 180 mg/dL) can raise your risk of infections and slow your recovery, so you will need to focus on controlling your diabetes during the weeks before surgery. Make sure that the doctor who takes care of your diabetes knows about your planned surgery including the date and location.  How do I manage my blood sugar before surgery? Check your blood sugar at least 4 times a day, starting 2 days before surgery, to make sure that the level is not too high or low.  Check your blood sugar the morning of your surgery when you wake up and every 2 hours until you get to the Short Stay unit.  If your blood sugar is less than 70 mg/dL, you will need to treat for low blood sugar: Do not take insulin. Treat a low blood sugar (less than 70 mg/dL) with  cup of clear juice (cranberry or apple), 4 glucose tablets, OR glucose gel. Recheck blood sugar in 15 minutes after treatment (to make sure it is greater than 70 mg/dL). If your blood sugar is not greater than 70 mg/dL on recheck, call [phone] for further instructions. Report your blood sugar to the short stay nurse when you get to Short Stay.  If you are admitted to the hospital after surgery: Your blood sugar will be checked by the staff and you will probably be given insulin after surgery (instead of oral diabetes medicines) to make sure you have good blood sugar levels. The goal for blood sugar  control after surgery is 80-180 mg/dL.           Do not wear jewelry or makeup. Do not wear lotions, powders, perfumes/cologne or deodorant. Do not shave 48 hours prior to surgery.   Do not bring valuables to the hospital. Do not wear nail polish, gel polish, artificial nails, or any other type of covering on natural nails (fingers and toes) If you have artificial nails or gel coating that need to be removed by a nail salon, please have this  removed prior to surgery. Artificial nails or gel coating may interfere with anesthesia's ability to adequately monitor your vital signs.  [place] is not responsible for any belongings or valuables.    Do NOT Smoke (Tobacco/Vaping)  24 hours prior to your procedure  If you use a CPAP at night, you may bring your mask for your overnight stay.   Contacts, glasses, hearing aids, dentures or partials may not be worn into surgery, please bring cases for these belongings   For patients admitted to the hospital, discharge time will be determined by your treatment team.   Patients discharged the day of surgery will not be allowed to drive home, and someone needs to stay with them for 24 hours.   SURGICAL WAITING ROOM VISITATION Patients having surgery or a procedure may have no more than 2 support people in the waiting area - these visitors may rotate.   Children under the age of 44 must have an adult with them who is not the patient. If the patient needs to stay at the hospital during part of their recovery, the visitor guidelines for inpatient rooms apply. Pre-op nurse will coordinate an appropriate time for 1 support person to accompany patient in pre-op.  This support person may not rotate.   Please refer to [email or web address].hu for the visitor guidelines for Inpatients (after your surgery is over and you are in a regular room).    Special instructions:    Oral Hygiene is also important to reduce your risk of infection.  Remember - BRUSH YOUR TEETH THE MORNING OF SURGERY WITH YOUR REGULAR TOOTHPASTE   [place]- Preparing For Surgery  Before surgery, you can play an important role. Because skin is not sterile, your skin needs to be as free of germs as possible. You can reduce the number of germs on your skin by washing with CHG (chlorahexidine gluconate) Soap before surgery.  CHG is an antiseptic cleaner which kills germs and bonds  with the skin to continue killing germs even after washing.     Please do not use if you have an allergy to CHG or antibacterial soaps. If your skin becomes reddened/irritated stop using the CHG.  Do not shave (including legs and underarms) for at least 48 hours prior to first CHG shower. It is OK to shave your face.  Please follow these instructions carefully.     Shower the NIGHT BEFORE SURGERY and the MORNING OF SURGERY with CHG Soap.   If you chose to wash your hair, wash your hair first as usual with your normal shampoo. After you shampoo, rinse your hair and body thoroughly to remove the shampoo.  Then [employer] and genitals (private parts) with your normal soap and rinse thoroughly to remove soap.  After that Use CHG Soap as you would any other liquid soap. You can apply CHG directly to the skin and wash gently with a scrungie or a clean washcloth.   Apply the CHG Soap to your  body ONLY FROM THE NECK DOWN.  Do not use on open wounds or open sores. Avoid contact with your eyes, ears, mouth and genitals (private parts). Wash Face and genitals (private parts)  with your normal soap.   Wash thoroughly, paying special attention to the area where your surgery will be performed.  Thoroughly rinse your body with warm water from the neck down.  DO NOT shower/wash with your normal soap after using and rinsing off the CHG Soap.  Pat yourself dry with a CLEAN TOWEL.  Wear CLEAN PAJAMAS to bed the night before surgery  Place CLEAN SHEETS on your bed the night before your surgery  DO NOT SLEEP WITH PETS.   Day of Surgery:  Take a shower with CHG soap. Wear Clean/Comfortable clothing the morning of surgery Do not apply any deodorants/lotions.   Remember to brush your teeth WITH YOUR REGULAR TOOTHPASTE.    If you received a COVID test during your pre-op visit, it is requested that you wear a mask when out in public, stay away from anyone that may not be feeling well, and notify your  surgeon if you develop symptoms. If you have been in contact with anyone that has tested positive in the last 10 days, please notify your surgeon.    Please read over the following fact sheets that you were given.

## 2022-03-21 ENCOUNTER — Other Ambulatory Visit: Payer: Self-pay

## 2022-03-21 ENCOUNTER — Encounter (HOSPITAL_COMMUNITY): Payer: Self-pay

## 2022-03-21 ENCOUNTER — Encounter: Payer: Self-pay | Admitting: Vascular Surgery

## 2022-03-21 ENCOUNTER — Ambulatory Visit (HOSPITAL_COMMUNITY)
Admission: RE | Admit: 2022-03-21 | Discharge: 2022-03-21 | Disposition: A | Discharge status: Home / Self Care | Payer: Medicare Other | Source: Ambulatory Visit | Attending: Thoracic Surgery (Cardiothoracic Vascular Surgery) | Admitting: Thoracic Surgery (Cardiothoracic Vascular Surgery)

## 2022-03-21 ENCOUNTER — Encounter (HOSPITAL_COMMUNITY)
Admission: RE | Admit: 2022-03-21 | Discharge: 2022-03-21 | Disposition: A | Discharge status: Home / Self Care | Payer: Medicare Other | Source: Ambulatory Visit | Attending: Thoracic Surgery (Cardiothoracic Vascular Surgery) | Admitting: Thoracic Surgery (Cardiothoracic Vascular Surgery)

## 2022-03-21 VITALS — BP 124/87 | HR 63 | Temp 97.6°F | Resp 18 | Ht 67.0 in | Wt 181.9 lb

## 2022-03-21 DIAGNOSIS — Z1152 Encounter for screening for COVID-19: Secondary | ICD-10-CM | POA: Insufficient documentation

## 2022-03-21 DIAGNOSIS — D696 Thrombocytopenia, unspecified: Secondary | ICD-10-CM | POA: Insufficient documentation

## 2022-03-21 DIAGNOSIS — C3490 Malignant neoplasm of unspecified part of unspecified bronchus or lung: Secondary | ICD-10-CM | POA: Insufficient documentation

## 2022-03-21 DIAGNOSIS — R7989 Other specified abnormal findings of blood chemistry: Secondary | ICD-10-CM | POA: Insufficient documentation

## 2022-03-21 DIAGNOSIS — R911 Solitary pulmonary nodule: Secondary | ICD-10-CM | POA: Insufficient documentation

## 2022-03-21 DIAGNOSIS — I340 Nonrheumatic mitral (valve) insufficiency: Secondary | ICD-10-CM | POA: Insufficient documentation

## 2022-03-21 DIAGNOSIS — E119 Type 2 diabetes mellitus without complications: Secondary | ICD-10-CM | POA: Insufficient documentation

## 2022-03-21 DIAGNOSIS — Z794 Long term (current) use of insulin: Secondary | ICD-10-CM | POA: Insufficient documentation

## 2022-03-21 DIAGNOSIS — Z01818 Encounter for other preprocedural examination: Secondary | ICD-10-CM | POA: Insufficient documentation

## 2022-03-21 LAB — COMPREHENSIVE METABOLIC PANEL
ALT: 16 U/L (ref 0–44)
AST: 29 U/L (ref 15–41)
Albumin: 3.5 g/dL (ref 3.5–5.0)
Alkaline Phosphatase: 118 U/L (ref 38–126)
Anion gap: 9 (ref 5–15)
BUN: 12 mg/dL (ref 8–23)
CO2: 18 mmol/L — ABNORMAL LOW (ref 22–32)
Calcium: 8.7 mg/dL — ABNORMAL LOW (ref 8.9–10.3)
Chloride: 104 mmol/L (ref 98–111)
Creatinine, Ser: 0.97 mg/dL (ref 0.44–1.00)
GFR, Estimated: >60 mL/min (ref >60)
Glucose, Bld: 285 mg/dL — ABNORMAL HIGH (ref 70–99)
Potassium: 4.3 mmol/L (ref 3.5–5.1)
Sodium: 131 mmol/L — ABNORMAL LOW (ref 135–145)
Total Bilirubin: 0.9 mg/dL (ref 0.3–1.2)
Total Protein: 6.7 g/dL (ref 6.5–8.1)

## 2022-03-21 LAB — PROTIME-INR
INR: 1.1 (ref 0.8–1.2)
Prothrombin Time: 13.9 seconds (ref 11.4–15.2)

## 2022-03-21 LAB — CBC
HCT: 37.1 % (ref 36.0–46.0)
Hemoglobin: 12.5 g/dL (ref 12.0–15.0)
MCH: 31.4 pg (ref 26.0–34.0)
MCHC: 33.7 g/dL (ref 30.0–36.0)
MCV: 93.2 fL (ref 80.0–100.0)
Platelets: 68 10*3/uL — ABNORMAL LOW (ref 150–400)
RBC: 3.98 MIL/uL (ref 3.87–5.11)
RDW: 14.8 % (ref 11.5–15.5)
WBC: 4.1 10*3/uL (ref 4.0–10.5)
nRBC: 0 % (ref 0.0–0.2)

## 2022-03-21 LAB — URINALYSIS, ROUTINE W REFLEX MICROSCOPIC
Bilirubin Urine: NEGATIVE
Glucose, UA: 150 mg/dL — AB
Hgb urine dipstick: NEGATIVE
Ketones, ur: NEGATIVE mg/dL
Leukocytes,Ua: NEGATIVE
Nitrite: NEGATIVE
Protein, ur: NEGATIVE mg/dL
Specific Gravity, Urine: 1.008 (ref 1.005–1.030)
pH: 6 (ref 5.0–8.0)

## 2022-03-21 LAB — TYPE AND SCREEN
ABO/RH(D): O POS
Antibody Screen: NEGATIVE

## 2022-03-21 LAB — BLOOD GAS, ARTERIAL
Acid-base deficit: 3.6 mmol/L — ABNORMAL HIGH (ref 0.0–2.0)
Bicarbonate: 20.4 mmol/L (ref 20.0–28.0)
O2 Saturation: 98.5 %
Patient temperature: 37
pCO2 arterial: 33 mmHg (ref 32–48)
pH, Arterial: 7.4 (ref 7.35–7.45)
pO2, Arterial: 121 mmHg — ABNORMAL HIGH (ref 83–108)

## 2022-03-21 LAB — APTT: aPTT: 27 seconds (ref 24–36)

## 2022-03-21 LAB — SURGICAL PCR SCREEN
MRSA, PCR: NEGATIVE
Staphylococcus aureus: NEGATIVE

## 2022-03-21 LAB — GLUCOSE, CAPILLARY: Glucose-Capillary: 319 mg/dL — ABNORMAL HIGH (ref 70–99)

## 2022-03-21 NOTE — Progress Notes (Signed)
PCP - Dr. Ramonita Lab Cardiologist - denies  PPM/ICD - n/a Device Orders - n/a Rep Notified - n/a  Chest x-ray - 03/21/22 EKG - ./19/24 Stress Test - denies ECHO - 07/23/16 Cardiac Cath - denies  Sleep Study - +OSA. Patient does not use or have CPAP CPAP - n/a  Fasting Blood Sugar - CBG today 319 Checks Blood Sugar- once a day.  Fasting BS- 80-120 per patient  Last dose of GLP1 agonist-   GLP1 instructions:   Blood Thinner Instructions: n/a Aspirin Instructions: n/a  ERAS Protcol - NPO  COVID TEST- 03/21/22. Pending.    Anesthesia review: Yes. EKG Review. CBG 319 upon arrival to PAT. Patient states she ate 2 tacos and and drank a regular Pepsi for lunch. Normal fasting CBG per patient is 80-120. Sigmund Hazel, PA made aware.   Patient denies shortness of breath, fever, cough and chest pain at PAT appointment   All instructions explained to the patient, with a verbal understanding of the material. Patient agrees to go over the instructions while at home for a better understanding. Patient also instructed to self quarantine after being tested for COVID-19. The opportunity to ask questions was provided.

## 2022-03-21 NOTE — Progress Notes (Signed)
Levonne Spiller, RN with Dr. Leonarda Salon office made aware of today's CBC showed a platelet count of 68. No new orders received at this time.

## 2022-03-22 ENCOUNTER — Other Ambulatory Visit: Payer: Self-pay | Admitting: *Deleted

## 2022-03-22 LAB — SARS CORONAVIRUS 2 (TAT 6-24 HRS): SARS Coronavirus 2: NEGATIVE

## 2022-03-23 ENCOUNTER — Encounter (HOSPITAL_COMMUNITY)
Admission: RE | Disposition: A | Discharge status: Home Health | Payer: Self-pay | Source: Home / Self Care | Attending: Thoracic Surgery (Cardiothoracic Vascular Surgery)

## 2022-03-23 ENCOUNTER — Inpatient Hospital Stay (HOSPITAL_COMMUNITY): Payer: Medicare Other | Admitting: Physician Assistant

## 2022-03-23 ENCOUNTER — Inpatient Hospital Stay (HOSPITAL_COMMUNITY)
Admission: RE | Admit: 2022-03-23 | Discharge: 2022-03-28 | DRG: 163 | Disposition: A | Discharge status: Home / Self Care | Payer: Medicare Other | Attending: Thoracic Surgery (Cardiothoracic Vascular Surgery) | Admitting: Thoracic Surgery (Cardiothoracic Vascular Surgery)

## 2022-03-23 ENCOUNTER — Other Ambulatory Visit: Payer: Self-pay

## 2022-03-23 ENCOUNTER — Encounter (HOSPITAL_COMMUNITY): Payer: Self-pay | Admitting: Thoracic Surgery (Cardiothoracic Vascular Surgery)

## 2022-03-23 ENCOUNTER — Inpatient Hospital Stay (HOSPITAL_COMMUNITY): Payer: Medicare Other

## 2022-03-23 DIAGNOSIS — E1165 Type 2 diabetes mellitus with hyperglycemia: Secondary | ICD-10-CM | POA: Diagnosis not present

## 2022-03-23 DIAGNOSIS — I10 Essential (primary) hypertension: Secondary | ICD-10-CM | POA: Diagnosis present

## 2022-03-23 DIAGNOSIS — G4730 Sleep apnea, unspecified: Secondary | ICD-10-CM | POA: Diagnosis present

## 2022-03-23 DIAGNOSIS — E871 Hypo-osmolality and hyponatremia: Secondary | ICD-10-CM | POA: Diagnosis present

## 2022-03-23 DIAGNOSIS — Z6827 Body mass index (BMI) 27.0-27.9, adult: Secondary | ICD-10-CM

## 2022-03-23 DIAGNOSIS — M1990 Unspecified osteoarthritis, unspecified site: Secondary | ICD-10-CM | POA: Diagnosis present

## 2022-03-23 DIAGNOSIS — Z7984 Long term (current) use of oral hypoglycemic drugs: Secondary | ICD-10-CM

## 2022-03-23 DIAGNOSIS — I7300 Raynaud's syndrome without gangrene: Secondary | ICD-10-CM | POA: Diagnosis present

## 2022-03-23 DIAGNOSIS — E785 Hyperlipidemia, unspecified: Secondary | ICD-10-CM | POA: Diagnosis present

## 2022-03-23 DIAGNOSIS — J9811 Atelectasis: Secondary | ICD-10-CM | POA: Diagnosis not present

## 2022-03-23 DIAGNOSIS — E11649 Type 2 diabetes mellitus with hypoglycemia without coma: Secondary | ICD-10-CM | POA: Diagnosis not present

## 2022-03-23 DIAGNOSIS — Z888 Allergy status to other drugs, medicaments and biological substances status: Secondary | ICD-10-CM

## 2022-03-23 DIAGNOSIS — F32A Depression, unspecified: Secondary | ICD-10-CM | POA: Diagnosis present

## 2022-03-23 DIAGNOSIS — R911 Solitary pulmonary nodule: Principal | ICD-10-CM | POA: Diagnosis present

## 2022-03-23 DIAGNOSIS — Z794 Long term (current) use of insulin: Secondary | ICD-10-CM

## 2022-03-23 DIAGNOSIS — Z880 Allergy status to penicillin: Secondary | ICD-10-CM

## 2022-03-23 DIAGNOSIS — D62 Acute posthemorrhagic anemia: Secondary | ICD-10-CM | POA: Diagnosis not present

## 2022-03-23 DIAGNOSIS — J8410 Pulmonary fibrosis, unspecified: Secondary | ICD-10-CM | POA: Diagnosis present

## 2022-03-23 DIAGNOSIS — E119 Type 2 diabetes mellitus without complications: Secondary | ICD-10-CM

## 2022-03-23 DIAGNOSIS — I898 Other specified noninfective disorders of lymphatic vessels and lymph nodes: Secondary | ICD-10-CM | POA: Diagnosis present

## 2022-03-23 DIAGNOSIS — I671 Cerebral aneurysm, nonruptured: Secondary | ICD-10-CM | POA: Diagnosis present

## 2022-03-23 DIAGNOSIS — I340 Nonrheumatic mitral (valve) insufficiency: Secondary | ICD-10-CM | POA: Diagnosis present

## 2022-03-23 DIAGNOSIS — E441 Mild protein-calorie malnutrition: Secondary | ICD-10-CM | POA: Diagnosis present

## 2022-03-23 DIAGNOSIS — J90 Pleural effusion, not elsewhere classified: Secondary | ICD-10-CM | POA: Diagnosis present

## 2022-03-23 DIAGNOSIS — J810 Acute pulmonary edema: Secondary | ICD-10-CM | POA: Diagnosis not present

## 2022-03-23 DIAGNOSIS — K7460 Unspecified cirrhosis of liver: Secondary | ICD-10-CM | POA: Diagnosis present

## 2022-03-23 DIAGNOSIS — J9383 Other pneumothorax: Secondary | ICD-10-CM | POA: Diagnosis not present

## 2022-03-23 DIAGNOSIS — F17210 Nicotine dependence, cigarettes, uncomplicated: Secondary | ICD-10-CM

## 2022-03-23 DIAGNOSIS — M25511 Pain in right shoulder: Secondary | ICD-10-CM | POA: Diagnosis not present

## 2022-03-23 DIAGNOSIS — C3431 Malignant neoplasm of lower lobe, right bronchus or lung: Secondary | ICD-10-CM | POA: Diagnosis present

## 2022-03-23 DIAGNOSIS — D696 Thrombocytopenia, unspecified: Secondary | ICD-10-CM | POA: Diagnosis present

## 2022-03-23 DIAGNOSIS — Z885 Allergy status to narcotic agent status: Secondary | ICD-10-CM

## 2022-03-23 DIAGNOSIS — I700 Atherosclerosis of aorta: Secondary | ICD-10-CM | POA: Diagnosis present

## 2022-03-23 DIAGNOSIS — J449 Chronic obstructive pulmonary disease, unspecified: Secondary | ICD-10-CM

## 2022-03-23 DIAGNOSIS — Z01818 Encounter for other preprocedural examination: Secondary | ICD-10-CM | POA: Diagnosis not present

## 2022-03-23 DIAGNOSIS — Z1152 Encounter for screening for COVID-19: Secondary | ICD-10-CM

## 2022-03-23 DIAGNOSIS — K219 Gastro-esophageal reflux disease without esophagitis: Secondary | ICD-10-CM | POA: Diagnosis present

## 2022-03-23 DIAGNOSIS — Z791 Long term (current) use of non-steroidal anti-inflammatories (NSAID): Secondary | ICD-10-CM

## 2022-03-23 DIAGNOSIS — M545 Low back pain, unspecified: Secondary | ICD-10-CM | POA: Diagnosis present

## 2022-03-23 DIAGNOSIS — Z90710 Acquired absence of both cervix and uterus: Secondary | ICD-10-CM

## 2022-03-23 DIAGNOSIS — Z9049 Acquired absence of other specified parts of digestive tract: Secondary | ICD-10-CM

## 2022-03-23 DIAGNOSIS — I2510 Atherosclerotic heart disease of native coronary artery without angina pectoris: Secondary | ICD-10-CM | POA: Diagnosis present

## 2022-03-23 DIAGNOSIS — Z801 Family history of malignant neoplasm of trachea, bronchus and lung: Secondary | ICD-10-CM

## 2022-03-23 DIAGNOSIS — F419 Anxiety disorder, unspecified: Secondary | ICD-10-CM | POA: Diagnosis present

## 2022-03-23 DIAGNOSIS — Z8249 Family history of ischemic heart disease and other diseases of the circulatory system: Secondary | ICD-10-CM

## 2022-03-23 DIAGNOSIS — Z79899 Other long term (current) drug therapy: Secondary | ICD-10-CM

## 2022-03-23 HISTORY — PX: XI ROBOTIC ASSISTED THORACOSCOPY- SEGMENTECTOMY: SHX6881

## 2022-03-23 HISTORY — PX: INTERCOSTAL NERVE BLOCK: SHX5021

## 2022-03-23 HISTORY — PX: LYMPH NODE BIOPSY: SHX201

## 2022-03-23 LAB — GLUCOSE, CAPILLARY
Glucose-Capillary: 216 mg/dL — ABNORMAL HIGH (ref 70–99)
Glucose-Capillary: 188 mg/dL — ABNORMAL HIGH (ref 70–99)
Glucose-Capillary: 210 mg/dL — ABNORMAL HIGH (ref 70–99)
Glucose-Capillary: 446 mg/dL — ABNORMAL HIGH (ref 70–99)

## 2022-03-23 SURGERY — RESECTION, LUNG, SEGMENTAL, ROBOT-ASSISTED
Anesthesia: General | Site: Chest | Laterality: Right

## 2022-03-23 MED ORDER — FENTANYL CITRATE (PF) 250 MCG/5ML IJ SOLN
INTRAMUSCULAR | Status: DC | PRN
  Administered 2022-03-23: 50 ug via INTRAVENOUS
  Administered 2022-03-23: 100 ug via INTRAVENOUS

## 2022-03-23 MED ORDER — ORAL CARE MOUTH RINSE: 15.0000 mL | Freq: Once | OROMUCOSAL | Status: AC

## 2022-03-23 MED ORDER — ALPRAZOLAM 0.25 MG PO TABS: 0.2500 mg | ORAL_TABLET | Freq: Every day | ORAL | Status: DC | PRN

## 2022-03-23 MED ORDER — LACTATED RINGERS IV SOLN: INTRAVENOUS | Status: DC

## 2022-03-23 MED ORDER — PROPOFOL 10 MG/ML IV BOLUS
INTRAVENOUS | Status: DC | PRN
  Administered 2022-03-23: 100 mg via INTRAVENOUS

## 2022-03-23 MED ORDER — INSULIN ASPART 100 UNIT/ML IJ SOLN
0.0000 [IU] | Freq: Three times a day (TID) | INTRAMUSCULAR | Status: DC
  Administered 2022-03-23: 5 [IU] via SUBCUTANEOUS
  Administered 2022-03-24: 11 [IU] via SUBCUTANEOUS
  Administered 2022-03-24: 8 [IU] via SUBCUTANEOUS
  Administered 2022-03-24: 11 [IU] via SUBCUTANEOUS
  Administered 2022-03-25: 2 [IU] via SUBCUTANEOUS
  Administered 2022-03-26: 3 [IU] via SUBCUTANEOUS
  Administered 2022-03-26: 2 [IU] via SUBCUTANEOUS
  Administered 2022-03-27: 3 [IU] via SUBCUTANEOUS
  Administered 2022-03-27: 5 [IU] via SUBCUTANEOUS
  Administered 2022-03-28: 8 [IU] via SUBCUTANEOUS

## 2022-03-23 MED ORDER — CYCLOBENZAPRINE HCL 10 MG PO TABS: 10.0000 mg | ORAL_TABLET | Freq: Three times a day (TID) | ORAL | Status: DC | PRN

## 2022-03-23 MED ORDER — BUPIVACAINE LIPOSOME 1.3 % IJ SUSP
INTRAMUSCULAR | Status: AC
  Filled 2022-03-23: qty 20

## 2022-03-23 MED ORDER — DEXAMETHASONE SODIUM PHOSPHATE 10 MG/ML IJ SOLN
INTRAMUSCULAR | Status: AC
  Filled 2022-03-23: qty 1

## 2022-03-23 MED ORDER — KETOROLAC TROMETHAMINE 15 MG/ML IJ SOLN
15.0000 mg | Freq: Four times a day (QID) | INTRAMUSCULAR | Status: AC
  Administered 2022-03-23 – 2022-03-25 (×8): 15 mg via INTRAVENOUS
  Filled 2022-03-23 (×8): qty 1

## 2022-03-23 MED ORDER — SPIRONOLACTONE 25 MG PO TABS
25.0000 mg | ORAL_TABLET | Freq: Every day | ORAL | Status: DC
  Administered 2022-03-24 – 2022-03-28 (×5): 25 mg via ORAL
  Filled 2022-03-23 (×5): qty 1

## 2022-03-23 MED ORDER — 0.9 % SODIUM CHLORIDE (POUR BTL) OPTIME
TOPICAL | Status: DC | PRN
  Administered 2022-03-23: 2000 mL

## 2022-03-23 MED ORDER — NADOLOL 40 MG PO TABS
80.0000 mg | ORAL_TABLET | Freq: Every day | ORAL | Status: DC
  Filled 2022-03-23: qty 2

## 2022-03-23 MED ORDER — PHENYLEPHRINE 80 MCG/ML (10ML) SYRINGE FOR IV PUSH (FOR BLOOD PRESSURE SUPPORT)
PREFILLED_SYRINGE | INTRAVENOUS | Status: DC | PRN
  Administered 2022-03-23: 80 ug via INTRAVENOUS

## 2022-03-23 MED ORDER — INSULIN ASPART 100 UNIT/ML IJ SOLN
20.0000 [IU] | Freq: Once | INTRAMUSCULAR | Status: AC
  Administered 2022-03-23: 20 [IU] via SUBCUTANEOUS

## 2022-03-23 MED ORDER — SODIUM CHLORIDE FLUSH 0.9 % IV SOLN
INTRAVENOUS | Status: DC | PRN
  Administered 2022-03-23: 100 mL

## 2022-03-23 MED ORDER — PHENYLEPHRINE HCL-NACL 20-0.9 MG/250ML-% IV SOLN
INTRAVENOUS | Status: DC | PRN
  Administered 2022-03-23: 25 ug/min via INTRAVENOUS

## 2022-03-23 MED ORDER — GABAPENTIN 300 MG PO CAPS
300.0000 mg | ORAL_CAPSULE | ORAL | Status: DC
  Administered 2022-03-23 – 2022-03-27 (×5): 300 mg via ORAL
  Filled 2022-03-23 (×5): qty 1

## 2022-03-23 MED ORDER — CHLORHEXIDINE GLUCONATE 0.12 % MT SOLN
15.0000 mL | Freq: Once | OROMUCOSAL | Status: AC
  Administered 2022-03-23: 15 mL via OROMUCOSAL
  Filled 2022-03-23: qty 15

## 2022-03-23 MED ORDER — ACETAMINOPHEN 500 MG PO TABS
1000.0000 mg | ORAL_TABLET | Freq: Four times a day (QID) | ORAL | Status: AC
  Administered 2022-03-23 – 2022-03-28 (×17): 1000 mg via ORAL
  Filled 2022-03-23 (×18): qty 2

## 2022-03-23 MED ORDER — GABAPENTIN 100 MG PO CAPS
100.0000 mg | ORAL_CAPSULE | Freq: Two times a day (BID) | ORAL | Status: DC
  Administered 2022-03-23 – 2022-03-28 (×10): 100 mg via ORAL
  Filled 2022-03-23 (×10): qty 1

## 2022-03-23 MED ORDER — COLESTIPOL HCL 1 G PO TABS
1.0000 g | ORAL_TABLET | Freq: Two times a day (BID) | ORAL | Status: DC
  Administered 2022-03-24 – 2022-03-28 (×9): 1 g via ORAL
  Filled 2022-03-23 (×10): qty 1

## 2022-03-23 MED ORDER — BISACODYL 5 MG PO TBEC
10.0000 mg | DELAYED_RELEASE_TABLET | Freq: Every day | ORAL | Status: DC
  Administered 2022-03-24 – 2022-03-28 (×5): 10 mg via ORAL
  Filled 2022-03-23 (×5): qty 2

## 2022-03-23 MED ORDER — KETOROLAC TROMETHAMINE 0.5 % OP SOLN
OPHTHALMIC | Status: AC
  Administered 2022-03-24: 1 [drp] via OPHTHALMIC
  Filled 2022-03-23: qty 5

## 2022-03-23 MED ORDER — LIDOCAINE 2% (20 MG/ML) 5 ML SYRINGE
INTRAMUSCULAR | Status: DC | PRN
  Administered 2022-03-23: 60 mg via INTRAVENOUS

## 2022-03-23 MED ORDER — INSULIN ASPART 100 UNIT/ML IJ SOLN
0.0000 [IU] | Freq: Every day | INTRAMUSCULAR | Status: DC
  Administered 2022-03-24: 2 [IU] via SUBCUTANEOUS

## 2022-03-23 MED ORDER — INDOCYANINE GREEN 25 MG IV SOLR
INTRAVENOUS | Status: DC | PRN
  Administered 2022-03-23: 25 mg via INTRAVENOUS

## 2022-03-23 MED ORDER — FENTANYL CITRATE (PF) 100 MCG/2ML IJ SOLN
25.0000 ug | INTRAMUSCULAR | Status: DC | PRN
  Administered 2022-03-23 (×2): 50 ug via INTRAVENOUS

## 2022-03-23 MED ORDER — OXYCODONE HCL 5 MG/5ML PO SOLN: 5.0000 mg | Freq: Once | ORAL | Status: DC | PRN

## 2022-03-23 MED ORDER — BSS IO SOLN
INTRAOCULAR | Status: AC
  Filled 2022-03-23: qty 15

## 2022-03-23 MED ORDER — ORAL CARE MOUTH RINSE: 15.0000 mL | OROMUCOSAL | Status: DC | PRN

## 2022-03-23 MED ORDER — PROPOFOL 10 MG/ML IV BOLUS
INTRAVENOUS | Status: AC
  Filled 2022-03-23: qty 20

## 2022-03-23 MED ORDER — SUGAMMADEX SODIUM 200 MG/2ML IV SOLN
INTRAVENOUS | Status: DC | PRN
  Administered 2022-03-23: 180 mg via INTRAVENOUS

## 2022-03-23 MED ORDER — VANCOMYCIN HCL IN DEXTROSE 1-5 GM/200ML-% IV SOLN
1000.0000 mg | Freq: Two times a day (BID) | INTRAVENOUS | Status: AC
  Administered 2022-03-23: 1000 mg via INTRAVENOUS
  Filled 2022-03-23: qty 200

## 2022-03-23 MED ORDER — ONDANSETRON HCL 4 MG/2ML IJ SOLN: 4.0000 mg | Freq: Four times a day (QID) | INTRAMUSCULAR | Status: DC | PRN

## 2022-03-23 MED ORDER — EPHEDRINE 5 MG/ML INJ
INTRAVENOUS | Status: AC
  Filled 2022-03-23: qty 5

## 2022-03-23 MED ORDER — FENTANYL CITRATE (PF) 250 MCG/5ML IJ SOLN
INTRAMUSCULAR | Status: AC
  Filled 2022-03-23: qty 5

## 2022-03-23 MED ORDER — BUPIVACAINE HCL (PF) 0.5 % IJ SOLN
INTRAMUSCULAR | Status: AC
  Filled 2022-03-23: qty 30

## 2022-03-23 MED ORDER — IPRATROPIUM BROMIDE 0.06 % NA SOLN
2.0000 | Freq: Two times a day (BID) | NASAL | Status: DC
  Administered 2022-03-23 – 2022-03-28 (×8): 2 via NASAL
  Filled 2022-03-23: qty 15

## 2022-03-23 MED ORDER — ONDANSETRON HCL 4 MG/2ML IJ SOLN
INTRAMUSCULAR | Status: DC | PRN
  Administered 2022-03-23: 4 mg via INTRAVENOUS

## 2022-03-23 MED ORDER — BSS IO SOLN
15.0000 mL | Freq: Once | INTRAOCULAR | Status: AC
  Administered 2022-03-23: 15 mL

## 2022-03-23 MED ORDER — OXYCODONE HCL 5 MG PO TABS: 5.0000 mg | ORAL_TABLET | Freq: Once | ORAL | Status: DC | PRN

## 2022-03-23 MED ORDER — INDOCYANINE GREEN 25 MG IV SOLR
INTRAVENOUS | Status: AC
  Filled 2022-03-23: qty 10

## 2022-03-23 MED ORDER — ALBUMIN HUMAN 5 % IV SOLN: INTRAVENOUS | Status: DC | PRN

## 2022-03-23 MED ORDER — TRAZODONE HCL 50 MG PO TABS
50.0000 mg | ORAL_TABLET | Freq: Every day | ORAL | Status: DC
  Administered 2022-03-23 – 2022-03-27 (×5): 50 mg via ORAL
  Filled 2022-03-23 (×5): qty 1

## 2022-03-23 MED ORDER — PANTOPRAZOLE SODIUM 40 MG PO TBEC
40.0000 mg | DELAYED_RELEASE_TABLET | Freq: Every day | ORAL | Status: DC
  Administered 2022-03-24 – 2022-03-28 (×5): 40 mg via ORAL
  Filled 2022-03-23 (×5): qty 1

## 2022-03-23 MED ORDER — KETOROLAC TROMETHAMINE 0.5 % OP SOLN
1.0000 [drp] | Freq: Four times a day (QID) | OPHTHALMIC | Status: AC
  Administered 2022-03-23 – 2022-03-24 (×3): 1 [drp] via OPHTHALMIC

## 2022-03-23 MED ORDER — ONDANSETRON HCL 4 MG/2ML IJ SOLN
INTRAMUSCULAR | Status: AC
  Filled 2022-03-23: qty 2

## 2022-03-23 MED ORDER — FENTANYL CITRATE (PF) 100 MCG/2ML IJ SOLN
INTRAMUSCULAR | Status: AC
  Filled 2022-03-23: qty 2

## 2022-03-23 MED ORDER — SODIUM CHLORIDE 0.45 % IV SOLN: INTRAVENOUS | Status: DC

## 2022-03-23 MED ORDER — EPHEDRINE SULFATE-NACL 50-0.9 MG/10ML-% IV SOSY
PREFILLED_SYRINGE | INTRAVENOUS | Status: DC | PRN
  Administered 2022-03-23: 5 mg via INTRAVENOUS
  Administered 2022-03-23: 2.5 mg via INTRAVENOUS
  Administered 2022-03-23: 5 mg via INTRAVENOUS

## 2022-03-23 MED ORDER — ROCURONIUM BROMIDE 10 MG/ML (PF) SYRINGE
PREFILLED_SYRINGE | INTRAVENOUS | Status: DC | PRN
  Administered 2022-03-23: 20 mg via INTRAVENOUS
  Administered 2022-03-23: 10 mg via INTRAVENOUS
  Administered 2022-03-23: 30 mg via INTRAVENOUS
  Administered 2022-03-23: 70 mg via INTRAVENOUS

## 2022-03-23 MED ORDER — INSULIN DETEMIR 100 UNIT/ML ~~LOC~~ SOLN
20.0000 [IU] | Freq: Two times a day (BID) | SUBCUTANEOUS | Status: DC
  Administered 2022-03-23: 20 [IU] via SUBCUTANEOUS
  Filled 2022-03-23 (×3): qty 0.2

## 2022-03-23 MED ORDER — PHENYLEPHRINE 80 MCG/ML (10ML) SYRINGE FOR IV PUSH (FOR BLOOD PRESSURE SUPPORT)
PREFILLED_SYRINGE | INTRAVENOUS | Status: AC
  Filled 2022-03-23: qty 10

## 2022-03-23 MED ORDER — VANCOMYCIN HCL IN DEXTROSE 1-5 GM/200ML-% IV SOLN
1000.0000 mg | INTRAVENOUS | Status: AC
  Administered 2022-03-23: 1000 mg via INTRAVENOUS
  Filled 2022-03-23: qty 200

## 2022-03-23 MED ORDER — ROCURONIUM BROMIDE 10 MG/ML (PF) SYRINGE
PREFILLED_SYRINGE | INTRAVENOUS | Status: AC
  Filled 2022-03-23: qty 20

## 2022-03-23 MED ORDER — SENNOSIDES-DOCUSATE SODIUM 8.6-50 MG PO TABS
1.0000 | ORAL_TABLET | Freq: Every day | ORAL | Status: DC
  Administered 2022-03-23 – 2022-03-27 (×5): 1 via ORAL
  Filled 2022-03-23 (×5): qty 1

## 2022-03-23 MED ORDER — LIDOCAINE 2% (20 MG/ML) 5 ML SYRINGE
INTRAMUSCULAR | Status: AC
  Filled 2022-03-23: qty 5

## 2022-03-23 MED ORDER — ACETAMINOPHEN 160 MG/5ML PO SOLN
1000.0000 mg | Freq: Four times a day (QID) | ORAL | Status: AC
  Administered 2022-03-25 (×2): 1000 mg via ORAL
  Filled 2022-03-23 (×3): qty 40.6

## 2022-03-23 MED ORDER — DEXAMETHASONE SODIUM PHOSPHATE 10 MG/ML IJ SOLN
INTRAMUSCULAR | Status: DC | PRN
  Administered 2022-03-23: 10 mg via INTRAVENOUS

## 2022-03-23 MED ORDER — OXYCODONE HCL 5 MG PO TABS
5.0000 mg | ORAL_TABLET | ORAL | Status: DC | PRN
  Administered 2022-03-24 – 2022-03-26 (×8): 10 mg via ORAL
  Administered 2022-03-26 – 2022-03-27 (×2): 5 mg via ORAL
  Filled 2022-03-23 (×4): qty 2
  Filled 2022-03-23 (×2): qty 1
  Filled 2022-03-23 (×4): qty 2

## 2022-03-23 MED ORDER — FENTANYL CITRATE (PF) 100 MCG/2ML IJ SOLN
25.0000 ug | INTRAMUSCULAR | Status: DC | PRN
  Administered 2022-03-23: 50 ug via INTRAVENOUS

## 2022-03-23 MED ORDER — BUSPIRONE HCL 15 MG PO TABS
7.5000 mg | ORAL_TABLET | Freq: Two times a day (BID) | ORAL | Status: DC
  Administered 2022-03-23 – 2022-03-28 (×10): 7.5 mg via ORAL
  Filled 2022-03-23 (×11): qty 1

## 2022-03-23 MED ORDER — ALBUTEROL SULFATE (2.5 MG/3ML) 0.083% IN NEBU
3.0000 mL | INHALATION_SOLUTION | Freq: Four times a day (QID) | RESPIRATORY_TRACT | Status: DC | PRN
  Administered 2022-03-24: 3 mL via RESPIRATORY_TRACT
  Filled 2022-03-23: qty 3

## 2022-03-23 MED ORDER — CHLORHEXIDINE GLUCONATE CLOTH 2 % EX PADS
6.0000 | MEDICATED_PAD | Freq: Every day | CUTANEOUS | Status: DC
  Administered 2022-03-23 – 2022-03-26 (×4): 6 via TOPICAL

## 2022-03-23 MED ORDER — LACTATED RINGERS IV SOLN: INTRAVENOUS | Status: DC | PRN

## 2022-03-23 SURGICAL SUPPLY — 100 items
ADH SKN CLS APL DERMABOND .7 (GAUZE/BANDAGES/DRESSINGS) ×2
APPLIER CLIP ROT 10 11.4 M/L (STAPLE)
APR CLP MED LRG 11.4X10 (STAPLE)
BAG SPEC RTRVL C125 8X14 (MISCELLANEOUS) ×2
BLADE CLIPPER SURG (BLADE) ×2 IMPLANT
CANISTER SUCT 3000ML PPV (MISCELLANEOUS) ×4 IMPLANT
CANNULA REDUC XI 12-8 STAPL (CANNULA) ×4
CANNULA REDUCER 12-8 DVNC XI (CANNULA) ×4 IMPLANT
CLIP APPLIE ROT 10 11.4 M/L (STAPLE) IMPLANT
CLIP TI MEDIUM 6 (CLIP) IMPLANT
CNTNR URN SCR LID CUP LEK RST (MISCELLANEOUS) ×10 IMPLANT
CONN ST 1/4X3/8  BEN (MISCELLANEOUS)
CONN ST 1/4X3/8 BEN (MISCELLANEOUS) IMPLANT
CONT SPEC 4OZ STRL OR WHT (MISCELLANEOUS) ×24
DEFOGGER SCOPE WARMER CLEARIFY (MISCELLANEOUS) ×2 IMPLANT
DERMABOND ADVANCED .7 DNX12 (GAUZE/BANDAGES/DRESSINGS) ×2 IMPLANT
DRAIN CHANNEL 28F RND 3/8 FF (WOUND CARE) IMPLANT
DRAIN CHANNEL 32F RND 10.7 FF (WOUND CARE) IMPLANT
DRAPE ARM DVNC X/XI (DISPOSABLE) ×8 IMPLANT
DRAPE COLUMN DVNC XI (DISPOSABLE) ×2 IMPLANT
DRAPE CV SPLIT W-CLR ANES SCRN (DRAPES) ×2 IMPLANT
DRAPE DA VINCI XI ARM (DISPOSABLE) ×8
DRAPE DA VINCI XI COLUMN (DISPOSABLE) ×2
DRAPE HALF SHEET 40X57 (DRAPES) ×2 IMPLANT
DRAPE INCISE IOBAN 66X45 STRL (DRAPES) IMPLANT
DRAPE ORTHO SPLIT 77X108 STRL (DRAPES) ×2
DRAPE SURG ORHT 6 SPLT 77X108 (DRAPES) ×2 IMPLANT
ELECT BLADE 6.5 EXT (BLADE) IMPLANT
ELECT REM PT RETURN 9FT ADLT (ELECTROSURGICAL) ×2
ELECTRODE REM PT RTRN 9FT ADLT (ELECTROSURGICAL) ×2 IMPLANT
GAUZE KITTNER 4X5 RF (MISCELLANEOUS) IMPLANT
GAUZE SPONGE 4X4 12PLY STRL (GAUZE/BANDAGES/DRESSINGS) ×2 IMPLANT
GLOVE SS BIOGEL STRL SZ 7.5 (GLOVE) ×2 IMPLANT
GOWN STRL REUS W/ TWL LRG LVL3 (GOWN DISPOSABLE) ×4 IMPLANT
GOWN STRL REUS W/ TWL XL LVL3 (GOWN DISPOSABLE) ×4 IMPLANT
GOWN STRL REUS W/TWL 2XL LVL3 (GOWN DISPOSABLE) ×2 IMPLANT
GOWN STRL REUS W/TWL LRG LVL3 (GOWN DISPOSABLE) ×6
GOWN STRL REUS W/TWL XL LVL3 (GOWN DISPOSABLE) ×4
HEMOSTAT SURGICEL 2X14 (HEMOSTASIS) ×8 IMPLANT
IRRIGATION STRYKERFLOW (MISCELLANEOUS) ×2 IMPLANT
IRRIGATOR STRYKERFLOW (MISCELLANEOUS) ×2
KIT BASIN OR (CUSTOM PROCEDURE TRAY) ×2 IMPLANT
NDL HYPO 25GX1X1/2 BEV (NEEDLE) ×2 IMPLANT
NEEDLE HYPO 25GX1X1/2 BEV (NEEDLE) ×2 IMPLANT
NS IRRIG 1000ML POUR BTL (IV SOLUTION) ×2 IMPLANT
PACK CHEST (CUSTOM PROCEDURE TRAY) ×2 IMPLANT
PAD ARMBOARD 7.5X6 YLW CONV (MISCELLANEOUS) ×4 IMPLANT
RELOAD STAPLE 45 2.5 WHT DVNC (STAPLE) IMPLANT
RELOAD STAPLE 45 3.5 BLU DVNC (STAPLE) IMPLANT
RELOAD STAPLE 45 4.3 GRN DVNC (STAPLE) IMPLANT
RELOAD STAPLE 45 4.6 BLK DVNC (STAPLE) IMPLANT
RELOAD STAPLER 2.5X45 WHT DVNC (STAPLE) ×4 IMPLANT
RELOAD STAPLER 3.5X45 BLU DVNC (STAPLE) ×6 IMPLANT
RELOAD STAPLER 4.3X45 GRN DVNC (STAPLE) ×6 IMPLANT
RELOAD STAPLER 45 4.6 BLK DVNC (STAPLE) ×6 IMPLANT
SCISSORS LAP 5X35 DISP (ENDOMECHANICALS) IMPLANT
SEAL CANN UNIV 5-8 DVNC XI (MISCELLANEOUS) ×4 IMPLANT
SEAL XI 5MM-8MM UNIVERSAL (MISCELLANEOUS) ×4
SET TRI-LUMEN FLTR TB AIRSEAL (TUBING) ×2 IMPLANT
SHEARS HARMONIC HDI 20CM (ELECTROSURGICAL) IMPLANT
SOL ELECTROSURG ANTI STICK (MISCELLANEOUS) ×2
SOLUTION ELECTROSURG ANTI STCK (MISCELLANEOUS) ×2 IMPLANT
SPONGE INTESTINAL PEANUT (DISPOSABLE) IMPLANT
SPONGE TONSIL 1 RF SGL (DISPOSABLE) IMPLANT
STAPLER 45 SUREFORM CVD (STAPLE) ×4
STAPLER 45 SUREFORM CVD DVNC (STAPLE) IMPLANT
STAPLER CANNULA SEAL DVNC XI (STAPLE) ×4 IMPLANT
STAPLER CANNULA SEAL XI (STAPLE) ×4
STAPLER RELOAD 2.5X45 WHITE (STAPLE) ×4
STAPLER RELOAD 2.5X45 WHT DVNC (STAPLE) ×4
STAPLER RELOAD 3.5X45 BLU DVNC (STAPLE) ×6
STAPLER RELOAD 3.5X45 BLUE (STAPLE) ×6
STAPLER RELOAD 4.3X45 GREEN (STAPLE) ×6
STAPLER RELOAD 4.3X45 GRN DVNC (STAPLE) ×6
STAPLER RELOAD 45 4.6 BLK (STAPLE) ×6
STAPLER RELOAD 45 4.6 BLK DVNC (STAPLE) ×6
SUT PDS AB 3-0 SH 27 (SUTURE) IMPLANT
SUT PROLENE 4 0 RB 1 (SUTURE)
SUT PROLENE 4-0 RB1 .5 CRCL 36 (SUTURE) IMPLANT
SUT SILK  1 MH (SUTURE) ×2
SUT SILK 1 MH (SUTURE) ×4 IMPLANT
SUT SILK 2 0 SH (SUTURE) IMPLANT
SUT SILK 2 0SH CR/8 30 (SUTURE) IMPLANT
SUT SILK 3 0SH CR/8 30 (SUTURE) IMPLANT
SUT VIC AB 1 CTX 36 (SUTURE) ×2
SUT VIC AB 1 CTX36XBRD ANBCTR (SUTURE) IMPLANT
SUT VIC AB 2-0 CTX 36 (SUTURE) IMPLANT
SUT VIC AB 3-0 X1 27 (SUTURE) ×2 IMPLANT
SUT VICRYL 0 TIES 12 18 (SUTURE) ×2 IMPLANT
SUT VICRYL 0 UR6 27IN ABS (SUTURE) ×4 IMPLANT
SUT VICRYL 2 TP 1 (SUTURE) IMPLANT
SYR 20CC LL (SYRINGE) ×4 IMPLANT
SYSTEM RETRIEVAL ANCHOR 8 (MISCELLANEOUS) IMPLANT
SYSTEM SAHARA CHEST DRAIN ATS (WOUND CARE) ×2 IMPLANT
TAPE CLOTH 4X10 WHT NS (GAUZE/BANDAGES/DRESSINGS) ×2 IMPLANT
TIP APPLICATOR SPRAY EXTEND 16 (VASCULAR PRODUCTS) IMPLANT
TOWEL GREEN STERILE (TOWEL DISPOSABLE) ×2 IMPLANT
TRAY FOLEY MTR SLVR 16FR STAT (SET/KITS/TRAYS/PACK) ×2 IMPLANT
TRAY FOLEY W/BAG SLVR 14FR LF (SET/KITS/TRAYS/PACK) IMPLANT
WATER STERILE IRR 1000ML POUR (IV SOLUTION) ×2 IMPLANT

## 2022-03-23 NOTE — Op Note (Signed)
NAME: Vanessa Oneill, Vanessa Oneill MEDICAL RECORD NO: [id number] ACCOUNT NO: [id number] DATE OF BIRTH: 10-09-1950 FACILITY: [facility] LOCATION: [facility] PHYSICIAN: Revonda Standard. Roxan Hockey, MD  Operative Report   DATE OF PROCEDURE: 03/23/2022  PREOPERATIVE DIAGNOSIS:  Right lower lobe lung nodule.  POSTOPERATIVE DIAGNOSIS:  Squamous cell carcinoma, right lower lobe, clinical stage IA (T1, N0).  PROCEDURE:   Xi robotic-assisted right lower lobe lateral basilar segmentectomy Lymph node sampling and  Intercostal nerve blocks levels 4 through 10.  SURGEON:  Revonda Standard. Roxan Hockey, MD  ASSISTANT:  Wynelle Beckmann, PA  ANESTHESIA:  General.  FINDINGS: Nodule clearly visible.  Frozen section revealed squamous cell carcinoma.  Bronchial margin and stapled margin negative for tumor.  Multiple enlarged and calcified lymph nodes.  CLINICAL NOTE:  Vanessa Oneill is a 72 year old woman with a history of tobacco abuse, COPD and pulmonary fibrosis.  She also has multiple other medical problems.  She had a low dose CT for lung cancer screening in 07/2021, which showed a 6 mm right lower lobe lung nodule and a cystic abnormality in the left lower lobe.  Followup scan showed an increase in size of the right lower lobe nodule with no change in the left lower lobe area.  PET/CT showed the nodule was hypermetabolic with an SUV of 2.5.  There was no mediastinal or hilar activity.  She did have some calcified nodes in the subcarinal space.  She was offered the option of surgical resection versus biopsy and stereotactic radiation.  The indications, risks, benefits, and alternatives were discussed in detail with the patient.  She understood and accepted the risks and agreed to proceed.  OPERATIVE NOTE:  Vanessa Oneill was brought to the preoperative holding area on 03/23/2022.  Anesthesia placed an arterial blood pressure monitoring line and established, intravenous access.  She was taken to the operating room and anesthetized and  intubated with a double lumen endotracheal tube.  Intravenous antibiotics were administered.  A Foley catheter was placed.  Sequential compression devices were placed on the calves for DVT prophylaxis.  She was placed in a left lateral decubitus position.  A Bair Hugger was placed for active warming.  The right chest was prepped and draped in the usual sterile fashion.  Single lung ventilation of the left lung was initiated and was tolerated well throughout the procedure.  A timeout was performed.  A solution containing 20 mL of liposomal bupivacaine, 30 mL of 0.5% bupivacaine and 50 mL of saline was prepared.  This solution was used for local at the incision sites as well as for the intercostal nerve blocks.  An incision was made in the eighth interspace in the midaxillary line, and an 8 mm port was inserted.  The thoracoscope was advanced into the chest, after confirming intrapleural placement, carbon dioxide was insufflated per protocol.  12 mm ports were placed in the eighth interspace posterior to the camera port and also anterior to the camera port.  Intercostal nerve blocks were performed from the fourth through the tenth interspace injecting 10 mL of the bupivacaine solution into a subpleural plane at each level.  A 12 mm AirSeal port was placed in the tenth interspace posterolaterally and then a 8 mm robotic port was placed in the eighth interspace posteriorly for the retraction arm.  The robot was deployed.  The camera arm was docked, targeting was performed.  The remaining arms were docked.  Robotic instruments were inserted with thoracoscopic visualization.  The lung was retracted superiorly and the inferior pulmonary ligament  was divided.  There was only a very small node present in the inferior pulmonary ligament which was removed.  The pleural reflection was divided at the hilum posteriorly.  Working up to the subcarinal area there were multiple calcified nodes in that area, 2 of the nodes were  removed.  The first was heavily calcified.  The second was only partially calcified.  The level 11 space at the bifurcation of the right upper lobe bronchus and bronchus intermedius was inspected and no node was found in that area.  The fissure then was inspected and was complete between the middle lobe and the lower lobe down to the level of the pulmonary artery. The basilar segmental pulmonary artery branches were  visible.  The pleura overlying then was incised, there was a large level 12 node, which was removed and there was some bleeding with removal of this node that was bronchial arterial in nature.  Surgicel was applied and pressure was held there and then cautery was used to ultimately control the bleeding.  The remaining portion of the fissure between the middle lobe and lower lobe then was divided using the robotic stapler.  The nodule was clearly visible in the lateral aspect of basilar segment of the right lower lobe.  The arterial branch supplying that area was encircled and was divided using the robotic stapler.  Then, the segmental bronchus was dissected out.  The stapler was placed across the segmental bronchus and closed.  A test inflation showed good aeration of the remainder of the lower lobe with no aeration of the segment involved with the tumor.  ICG was administered intravenously and there was a clear demarcation initially between the involved segment and the remainder of the lower lobe.  The demarcation was scored with cautery.  There was some late filling with ICG in that segment, likely due to bronchial arterial collaterals.  The segmentectomy then was completed using sequential firings of the robotic stapler, blue, green and black cartridges were used depending on tissue thickness.  The specimen was placed into an endoscopic retrieval bag and removed through the AirSeal port incision.  The bronchial and vascular margins were sent as a separate specimens for frozen section.  The nodule  was clearly within the resected lung.  The closest parenchymal margin was marked with a suture and sent for frozen section.  While awaiting the results of the frozen sections lung was retracted inferiorly.  The pleural reflection was divided at the hilum between the azygos vein and the upper lobe pulmonary branches.  There was fatty tissue in this area and it bled easily.  Level 10R and 4R nodes were removed and sent as specimens for permanent pathology.  At this point, the frozen section returned showing squamous cell carcinoma.  The bronchial margin and parenchymal margin were both negative for tumor.  The chest was copiously irrigated with saline.  A test inflation to 30 cm water pressure revealed no air leakage from the bronchial stump or the parenchymal staple lines.  The vessel loop and sponges used during the dissection were removed.  Robotic instruments were removed.  The robot was undocked.  The ports were removed.  All port sites were inspected for hemostasis as were the staple lines.  There was no significant bleeding.  A 28-French Blake drain was placed through the original port incision and directed to the apex.  It was secured with #1 silk suture.  Dual lung ventilation was resumed.  The remaining  incisions were closed  in standard fashion.  The chest tube was placed to a Pleur-Evac on waterseal.  She was extubated in the operating room and taken to the postanesthetic care unit in good condition.  All sponge, needle and instrument counts were  correct at the end of the procedure.  Experienced assistance was necessary for this case due to surgical complexity.  Wynelle Beckmann, PA served as the Environmental consultant providing assistance with port placement, robot docking and undocking, instrument exchange, specimen retrieval, suctioning, and wound closure.   PUS D: 03/23/2022 4:50:47 pm T: 03/23/2022 5:42:00 pm  JOB: [id number]

## 2022-03-23 NOTE — Plan of Care (Signed)
  Problem: Education: Goal: Knowledge of General Education information will improve Description: Including pain rating scale, medication(s)/side effects and non-pharmacologic comfort measures Outcome: Progressing   Problem: Health Behavior/Discharge Planning: Goal: Ability to manage health-related needs will improve Outcome: Progressing   Problem: Clinical Measurements: Goal: Ability to maintain clinical measurements within normal limits will improve Outcome: Progressing Goal: Will remain free from infection Outcome: Progressing Goal: Diagnostic test results will improve Outcome: Progressing Goal: Respiratory complications will improve Outcome: Progressing Goal: Cardiovascular complication will be avoided Outcome: Progressing   Problem: Activity: Goal: Risk for activity intolerance will decrease Outcome: Progressing   Problem: Nutrition: Goal: Adequate nutrition will be maintained Outcome: Progressing   Problem: Coping: Goal: Level of anxiety will decrease Outcome: Progressing   Problem: Elimination: Goal: Will not experience complications related to bowel motility Outcome: Progressing Goal: Will not experience complications related to urinary retention Outcome: Progressing   Problem: Pain Managment: Goal: General experience of comfort will improve Outcome: Progressing   Problem: Safety: Goal: Ability to remain free from injury will improve Outcome: Progressing   Problem: Skin Integrity: Goal: Risk for impaired skin integrity will decrease Outcome: Progressing   Problem: Education: Goal: Ability to describe self-care measures that may prevent or decrease complications (Diabetes Survival Skills Education) will improve Outcome: Progressing Goal: Individualized Educational Video(s) Outcome: Progressing   Problem: Coping: Goal: Ability to adjust to condition or change in health will improve Outcome: Progressing   Problem: Fluid Volume: Goal: Ability to  maintain a balanced intake and output will improve Outcome: Progressing   Problem: Health Behavior/Discharge Planning: Goal: Ability to identify and utilize available resources and services will improve Outcome: Progressing Goal: Ability to manage health-related needs will improve Outcome: Progressing   Problem: Metabolic: Goal: Ability to maintain appropriate glucose levels will improve Outcome: Progressing   Problem: Nutritional: Goal: Maintenance of adequate nutrition will improve Outcome: Progressing Goal: Progress toward achieving an optimal weight will improve Outcome: Progressing   Problem: Skin Integrity: Goal: Risk for impaired skin integrity will decrease Outcome: Progressing   Problem: Tissue Perfusion: Goal: Adequacy of tissue perfusion will improve Outcome: Progressing   Problem: Education: Goal: Knowledge of disease or condition will improve Outcome: Progressing Goal: Knowledge of the prescribed therapeutic regimen will improve Outcome: Progressing   Problem: Activity: Goal: Risk for activity intolerance will decrease Outcome: Progressing   Problem: Cardiac: Goal: Will achieve and/or maintain hemodynamic stability Outcome: Progressing   Problem: Clinical Measurements: Goal: Postoperative complications will be avoided or minimized Outcome: Progressing   Problem: Respiratory: Goal: Respiratory status will improve Outcome: Progressing   Problem: Pain Management: Goal: Pain level will decrease Outcome: Progressing   Problem: Skin Integrity: Goal: Wound healing without signs and symptoms infection will improve Outcome: Progressing   Problem: Education: Goal: Knowledge of disease or condition will improve Outcome: Progressing Goal: Knowledge of the prescribed therapeutic regimen will improve Outcome: Progressing   Problem: Activity: Goal: Risk for activity intolerance will decrease Outcome: Progressing   Problem: Cardiac: Goal: Will achieve  and/or maintain hemodynamic stability Outcome: Progressing   Problem: Clinical Measurements: Goal: Postoperative complications will be avoided or minimized Outcome: Progressing   Problem: Respiratory: Goal: Respiratory status will improve Outcome: Progressing   Problem: Pain Management: Goal: Pain level will decrease Outcome: Progressing   Problem: Skin Integrity: Goal: Wound healing without signs and symptoms infection will improve Outcome: Progressing

## 2022-03-23 NOTE — Transfer of Care (Signed)
Immediate Anesthesia Transfer of Care Note  Patient: Vanessa Oneill  Procedure(s) Performed: XI ROBOTIC ASSISTED THORACOSCOPY-RIGHT LOWER LOBE LATERAL BASILAR SEGMENTECTOMY (Right: Chest) INTERCOSTAL NERVE BLOCK (Chest) LYMPH NODE BIOPSY (Chest)  Patient Location: PACU  Anesthesia Type:General  Level of Consciousness: drowsy and patient cooperative  Airway & Oxygen Therapy: Patient Spontanous Breathing and Patient connected to face mask oxygen  Post-op Assessment: Report given to RN and Post -op Vital signs reviewed and stable  Post vital signs: Reviewed and stable  Last Vitals:  Vitals Value Taken Time  BP 116/68 03/23/22 1203  Temp    Pulse 54 03/23/22 1211  Resp 14 03/23/22 1211  SpO2 91 % 03/23/22 1211  Vitals shown include unvalidated device data.  Last Pain:  Vitals:   03/23/22 0624  TempSrc:   PainSc: 4       Patients Stated Pain Goal: 2 ([id number])  Complications: No notable events documented.

## 2022-03-23 NOTE — Anesthesia Procedure Notes (Signed)
Procedure Name: Intubation Date/Time: 03/23/2022 8:25 AM  Performed by: Bryson Corona, CRNAPre-anesthesia Checklist: Patient identified, Emergency Drugs available, Suction available and Patient being monitored Patient Re-evaluated:Patient Re-evaluated prior to induction Oxygen Delivery Method: Circle System Utilized Preoxygenation: Pre-oxygenation with 100% oxygen Induction Type: IV induction Ventilation: Mask ventilation without difficulty and Oral airway inserted - appropriate to patient size Laryngoscope Size: Mac and 3 Grade View: Grade I Tube type: Oral Endobronchial tube: Double lumen EBT, EBT position confirmed by auscultation and Left and 37 Fr Number of attempts: 1 Airway Equipment and Method: Stylet and Oral airway Placement Confirmation: ETT inserted through vocal cords under direct vision, positive ETCO2 and breath sounds checked- equal and bilateral Secured at: 29 cm Tube secured with: Tape Dental Injury: Teeth and Oropharynx as per pre-operative assessment

## 2022-03-23 NOTE — Brief Op Note (Addendum)
03/23/2022  12:00 PM  PATIENT:  Vanessa Oneill  72 y.o. female  PRE-OPERATIVE DIAGNOSIS: Right lower lobe lung nodule  POST-OPERATIVE DIAGNOSIS: Squamous cell carcinoma of the right lower lobe of the lung, clinical stage Ia (T1, N0)  PROCEDURE:  XI ROBOTIC ASSISTED THORACOSCOPY-RIGHT LOWER LOBE LATERAL BASILAR SEGMENTECTOMY (Right) INTERCOSTAL NERVE BLOCK LYMPH NODE BIOPSY  SURGEON:  Surgeon(s) and Role:    * Melrose Nakayama, MD - Primary  PHYSICIAN ASSISTANT: Wynelle Beckmann PA-C  ASSISTANTS: none   ANESTHESIA:   local and general  EBL:  150 mL   BLOOD ADMINISTERED:none  DRAINS:  right pleural tube    LOCAL MEDICATIONS USED:  OTHER Exparel  SPECIMEN:  Source of Specimen:  Right lower lobe lateral basilar segment, lymph nodes  DISPOSITION OF SPECIMEN:  PATHOLOGY  COUNTS:  YES  DICTATION: .Dragon Dictation  PLAN OF CARE: Admit to inpatient   PATIENT DISPOSITION:  PACU - hemodynamically stable.   Delay start of Pharmacological VTE agent (>24hrs) due to surgical blood loss or risk of bleeding: yes

## 2022-03-23 NOTE — Hospital Course (Addendum)
HPI:  Vanessa Oneill is sent for consultation regarding a right lower lobe lung nodule.   Vanessa Oneill is a 72 year old woman with a history of ongoing tobacco abuse, COPD, pulmonary fibrosis, hypertension, hyperlipidemia, mitral regurgitation, aortic and coronary atherosclerosis, cerebral aneurysm, sleep apnea, Raynaud's, reflux, type 2 diabetes without complication, colon adenomas, anxiety and depression.  She has smoked about a pack a day for 55 years.   She had a low-dose CT for lung cancer screening back in July which showed a cystic abnormality in the left lower lobe and a 6 mm right lower lobe lung nodule.  A follow-up scan in 6 months was done in January.  It showed no change in the left lower lobe cystic lesion.  However the right lower lobe nodule was dramatically larger at 11.6 mm in diameter.  She had some calcified hilar mediastinal adenopathy but no pathologically enlarged lymph nodes.  PET/CT showed the right lower lobe nodule was hypermetabolic with an SUV of 2.5.  There also was hypermetabolic activity in the cystic lesion on the left.  There is no mediastinal or hilar activity.  There was an area of increased activity in the rectum.   She did call with GI re: the finding in the rectum on CT, but was given an appointment in June.   She gets short of breath with exertion.  She feels like she can walk up a flight of stairs but would be short of breath.  She thinks she could do it without stopping but has not tried recently.  She has a frequent productive cough and wheezing.  She uses her albuterol inhaler about twice a day.  She had to stop Trelegy because it made her cough worse.  She has frequent heartburn but no exertional chest pain, pressure, or tightness.  She does complain of decreased energy.  She has had a poor appetite and has lost about 5 pounds over the past 3 months.  Dr. Roxan Hockey reviewed the patient's diagnostic studies and determined surgical intervention would be  beneficial to this patient. He discussed the patient's treatment options as well as the risks and benefits of surgery. Vanessa Oneill was agreeable to proceed with surgery.  Hospital Course: Vanessa Oneill arrived at [facility] and was brought to the operating room on 03/23/22. She underwent right lower lobe lateral basilar segmentectomy and lymph node dissection. She tolerated the procedure well and was transferred to the PACU in stable condition. She had chronic thrombocytopenia so Lovenox was held. Chest tube was placed to water seal and there was no air leak seen. CXR showed bibasilar pleural effusions/atelectasis but no pneumothorax. Her chest tube was left in place for moderate drainage. It slowed over time. She is an active smoker and has required aggressive pulmonary hygiene. Oxygen is slowly being weaned and saturations are improving. She has been started on nebulizer treatments as well. Blood sugars have been difficult to control over time and she is noted to be poorly controlled at home. Insulin and home meds have been adjusted. Renal function has remained within normal limits. She does have an expected acute blood loss anemia which is stabilized. She developed a trace right apical pneumothorax which resolved the following day. Chest tube was removed without complication. Follow up CXR showed *** . PT/OT evaluation recommended ***.

## 2022-03-23 NOTE — Interval H&P Note (Signed)
History and Physical Interval Note:  03/23/2022 7:51 AM  Vanessa Oneill  has presented today for surgery, with the diagnosis of RLL LUNG NODULE.  The various methods of treatment have been discussed with the patient and family. After consideration of risks, benefits and other options for treatment, the patient has consented to  Procedure(s): XI ROBOTIC ASSISTED THORACOSCOPY-RIGHT LOWER LOBE SEGMENTECTOMY (Right) as a surgical intervention.  The patient's history has been reviewed, patient examined, no change in status, stable for surgery.  I have reviewed the patient's chart and labs.  Questions were answered to the patient's satisfaction.     Melrose Nakayama

## 2022-03-23 NOTE — Anesthesia Preprocedure Evaluation (Addendum)
Anesthesia Evaluation  Patient identified by MRN, date of birth, ID band Patient awake    Reviewed: Allergy & Precautions, NPO status , Patient's Chart, lab work & pertinent test results  History of Anesthesia Complications Negative for: history of anesthetic complications  Airway Mallampati: III  TM Distance: <3 FB Neck ROM: Full    Dental  (+) Upper Dentures, Partial Lower, Dental Advisory Given   Pulmonary sleep apnea , COPD,  COPD inhaler, Current Smoker and Patient abstained from smoking.   breath sounds clear to auscultation       Cardiovascular hypertension,  Rhythm:Regular  aortic and coronary atherosclerosis   Neuro/Psych  Headaches PSYCHIATRIC DISORDERS Anxiety Depression       GI/Hepatic ,GERD  ,,(+) Cirrhosis   Esophageal Varices      Endo/Other  diabetes    Renal/GU negative Renal ROS     Musculoskeletal  (+) Arthritis ,    Abdominal   Peds  Hematology  (+) Blood dyscrasia Lab Results      Component                Value               Date                      WBC                      4.1                 03/21/2022                HGB                      12.5                03/21/2022                HCT                      37.1                03/21/2022                MCV                      93.2                03/21/2022                PLT                      68 (L)              03/21/2022              Anesthesia Other Findings   Reproductive/Obstetrics                             Anesthesia Physical Anesthesia Plan  ASA: 3  Anesthesia Plan: General   Post-op Pain Management: Ofirmev IV (intra-op)*   Induction: Intravenous  PONV Risk Score and Plan: 2 and Ondansetron and Dexamethasone  Airway Management Planned: Double Lumen EBT  Additional Equipment: Arterial line  Intra-op Plan:   Post-operative Plan: Extubation in OR  Informed Consent: I have reviewed  the patients History and Physical, chart, labs  and discussed the procedure including the risks, benefits and alternatives for the proposed anesthesia with the patient or authorized representative who has indicated his/her understanding and acceptance.     Dental advisory given  Plan Discussed with: CRNA  Anesthesia Plan Comments:        Anesthesia Quick Evaluation

## 2022-03-23 NOTE — Discharge Instructions (Signed)
If any questions or concerns arise, please do not hesitate to contact our office at [facility], Care After The following information offers guidance on how to care for yourself after your procedure. Your health care provider may also give you more specific instructions. If you have problems or questions, contact your health care provider. What can I expect after the procedure? After the procedure, it is common to have: Some pain and aches in the area of your surgical incisions. Pain when breathing in (inhaling) and coughing. Tiredness (fatigue). Trouble sleeping. Constipation. Follow these instructions at home: Medicines Take over-the-counter and prescription medicines only as told by your health care provider. If you were prescribed an antibiotic medicine, take it as told by your health care provider. Do not stop taking the antibiotic even if you start to feel better. Talk with your health care provider about safe and effective ways to manage pain after your procedure. Pain management should fit your specific health needs. Take pain medicine before pain becomes severe. Relieving and controlling your pain will make breathing easier for you. Ask your health care provider if the medicine prescribed to you requires you to avoid driving or using machinery. Eating and drinking Follow instructions from your health care provider about eating or drinking restrictions. These will vary depending on what procedure you had. Your health care provider may recommend: A liquid diet or soft diet for the first few days. Meals that are smaller and more frequent. A diet of fruits, vegetables, whole grains, and low-fat proteins. Limiting foods that are high in fat and processed sugar, including fried or sweet foods. Incision care Follow instructions from your health care provider about how to take care of your incisions. Make sure you: Wash your hands with soap and water for  at least 20 seconds before and after you change your bandage (dressing). If soap and water are not available, use hand sanitizer. Change your dressing as told by your health care provider. Leave stitches (sutures), skin glue, or adhesive strips in place. These skin closures may need to stay in place for 2 weeks or longer. If adhesive strip edges start to loosen and curl up, you may trim the loose edges. Do not remove adhesive strips completely unless your health care provider tells you to do that. Check your incision area every day for signs of infection. Check for: Redness, swelling, or more pain. Fluid or blood. Warmth. Pus or a bad smell. Activity Return to your normal activities as told by your health care provider. Ask your health care provider what activities are safe for you. Ask your health care provider when it is safe for you to drive. Do not lift anything that is heavier than 10 lb (4.5 kg), or the limit that you are told, until your health care provider says that it is safe. Rest as told by your health care provider. Avoid sitting for a long time without moving. Get up to take short walks every 1-2 hours. This is important to improve blood flow and breathing. Ask for help if you feel weak or unsteady. Do exercises as told by your health care provider. Pneumonia prevention  Do deep breathing exercises and cough regularly as directed. This helps clear mucus and opens your lungs. Doing this helps prevent lung infection (pneumonia). If you were given an incentive spirometer, use it as told. An incentive spirometer is a tool that measures how well you are filling your lungs with each breath. Coughing may hurt less  if you try to support your chest. This is called splinting. Try one of these when you cough: Hold a pillow against your chest. Place the palms of both hands on top of your incision area. Do not use any products that contain nicotine or tobacco. These products include  cigarettes, chewing tobacco, and vaping devices, such as e-cigarettes. If you need help quitting, ask your health care provider. Avoid secondhand smoke. General instructions If you have a drainage tube: Follow instructions from your health care provider about how to take care of it. Do not travel by airplane after your tube is removed until your health care provider tells you it is safe. You may need to take these actions to prevent or treat constipation: Drink enough fluid to keep your urine pale yellow. Take over-the-counter or prescription medicines. Eat foods that are high in fiber, such as beans, whole grains, and fresh fruits and vegetables. Limit foods that are high in fat and processed sugars, such as fried or sweet foods. Keep all follow-up visits. This is important. Contact a health care provider if: You have redness, swelling, or more pain around an incision. You have fluid or blood coming from an incision. An incision feels warm to the touch. You have pus or a bad smell coming from an incision. You have a fever. You cannot eat or drink without vomiting. Your pain medicine is not controlling your pain. Get help right away if: You have chest pain. Your heart is beating quickly. You have trouble breathing. You have trouble speaking. You are confused. You feel weak or dizzy, or you faint. These symptoms may represent a serious problem that is an emergency. Do not wait to see if the symptoms will go away. Get medical help right away. Call your local emergency services (911 in the [place] Do not drive yourself to the hospital. Summary Talk with your health care provider about safe and effective ways to manage pain after your procedure. Pain management should fit your specific health needs. Return to your normal activities as told by your health care provider. Ask your health care provider what activities are safe for you. Do deep breathing exercises and cough regularly as  directed. This helps to clear mucus and prevent pneumonia. If it hurts to cough, ease pain by holding a pillow against your chest or by placing the palms of both hands over your incisions. This information is not intended to replace advice given to you by your health care provider. Make sure you discuss any questions you have with your health care provider. Document Revised: 09/12/2019 Document Reviewed: 09/12/2019 Elsevier Patient Education  [place].

## 2022-03-23 NOTE — Interval H&P Note (Signed)
History and Physical Interval Note:  03/23/2022 7:45 AM  Vanessa Oneill  has presented today for surgery, with the diagnosis of RLL LUNG NODULE.  The various methods of treatment have been discussed with the patient and family. After consideration of risks, benefits and other options for treatment, the patient has consented to  Procedure(s): XI ROBOTIC ASSISTED THORACOSCOPY-RIGHT LOWER LOBE SEGMENTECTOMY (Right) as a surgical intervention.  The patient's history has been reviewed, patient examined, no change in status, stable for surgery.  I have reviewed the patient's chart and labs.  Questions were answered to the patient's satisfaction.     Melrose Nakayama

## 2022-03-23 NOTE — Anesthesia Procedure Notes (Signed)
Arterial Line Insertion Start/End3/21/2024 7:20 AM, 03/23/2022 7:30 AM Performed by: Bryson Corona, CRNA, CRNA  Patient location: Pre-op. Preanesthetic checklist: patient identified, IV checked, site marked, risks and benefits discussed, surgical consent, monitors and equipment checked, pre-op evaluation, timeout performed and anesthesia consent Lidocaine 1% used for infiltration Left, radial was placed Catheter size: 20 G Hand hygiene performed  and maximum sterile barriers used   Attempts: 2 Procedure performed without using ultrasound guided technique. Following insertion, dressing applied and Biopatch. Post procedure assessment: normal and unchanged  Patient tolerated the procedure well with no immediate complications. Additional procedure comments: Pt has small wrist and shallow artery. First attempt got in easily but could not advance the catheter without coming out of the vessel. Second attempt made slightly above the first and easily placed arterial line. Artery still very shallow. Pt anxious about procedure but tolerated very well. Marland Kitchen

## 2022-03-24 ENCOUNTER — Other Ambulatory Visit: Payer: Self-pay

## 2022-03-24 ENCOUNTER — Inpatient Hospital Stay (HOSPITAL_COMMUNITY): Payer: Medicare Other

## 2022-03-24 ENCOUNTER — Encounter (HOSPITAL_COMMUNITY): Payer: Self-pay | Admitting: Thoracic Surgery (Cardiothoracic Vascular Surgery)

## 2022-03-24 LAB — BASIC METABOLIC PANEL
Anion gap: 8 (ref 5–15)
BUN: 16 mg/dL (ref 8–23)
CO2: 17 mmol/L — ABNORMAL LOW (ref 22–32)
Calcium: 7.8 mg/dL — ABNORMAL LOW (ref 8.9–10.3)
Chloride: 100 mmol/L (ref 98–111)
Creatinine, Ser: 1.04 mg/dL — ABNORMAL HIGH (ref 0.44–1.00)
GFR, Estimated: 58 mL/min — ABNORMAL LOW (ref >60)
Glucose, Bld: 377 mg/dL — ABNORMAL HIGH (ref 70–99)
Potassium: 4.3 mmol/L (ref 3.5–5.1)
Sodium: 125 mmol/L — ABNORMAL LOW (ref 135–145)

## 2022-03-24 LAB — GLUCOSE, CAPILLARY
Glucose-Capillary: 313 mg/dL — ABNORMAL HIGH (ref 70–99)
Glucose-Capillary: 256 mg/dL — ABNORMAL HIGH (ref 70–99)
Glucose-Capillary: 339 mg/dL — ABNORMAL HIGH (ref 70–99)
Glucose-Capillary: 215 mg/dL — ABNORMAL HIGH (ref 70–99)

## 2022-03-24 LAB — CBC
HCT: 28.8 % — ABNORMAL LOW (ref 36.0–46.0)
Hemoglobin: 9.9 g/dL — ABNORMAL LOW (ref 12.0–15.0)
MCH: 31.9 pg (ref 26.0–34.0)
MCHC: 34.4 g/dL (ref 30.0–36.0)
MCV: 92.9 fL (ref 80.0–100.0)
Platelets: 57 10*3/uL — ABNORMAL LOW (ref 150–400)
RBC: 3.1 MIL/uL — ABNORMAL LOW (ref 3.87–5.11)
RDW: 14.9 % (ref 11.5–15.5)
WBC: 4.4 10*3/uL (ref 4.0–10.5)
nRBC: 0 % (ref 0.0–0.2)

## 2022-03-24 MED ORDER — INSULIN DETEMIR 100 UNIT/ML ~~LOC~~ SOLN
30.0000 [IU] | Freq: Two times a day (BID) | SUBCUTANEOUS | Status: DC
  Administered 2022-03-24: 30 [IU] via SUBCUTANEOUS
  Filled 2022-03-24 (×2): qty 0.3

## 2022-03-24 MED ORDER — FENTANYL CITRATE PF 50 MCG/ML IJ SOSY
25.0000 ug | PREFILLED_SYRINGE | INTRAMUSCULAR | Status: DC | PRN
  Administered 2022-03-24: 50 ug via INTRAVENOUS
  Filled 2022-03-24: qty 1

## 2022-03-24 MED ORDER — METFORMIN HCL ER 500 MG PO TB24
1000.0000 mg | ORAL_TABLET | Freq: Every day | ORAL | Status: DC
  Administered 2022-03-24 – 2022-03-27 (×4): 1000 mg via ORAL
  Filled 2022-03-24 (×5): qty 2

## 2022-03-24 MED ORDER — GLIMEPIRIDE 4 MG PO TABS
4.0000 mg | ORAL_TABLET | Freq: Every day | ORAL | Status: DC
  Administered 2022-03-24 – 2022-03-28 (×5): 4 mg via ORAL
  Filled 2022-03-24 (×5): qty 1

## 2022-03-24 MED ORDER — INSULIN DETEMIR 100 UNIT/ML ~~LOC~~ SOLN
40.0000 [IU] | Freq: Two times a day (BID) | SUBCUTANEOUS | Status: DC
  Administered 2022-03-24: 40 [IU] via SUBCUTANEOUS
  Filled 2022-03-24 (×4): qty 0.4

## 2022-03-24 NOTE — Discharge Summary (Addendum)
Physician Discharge Summary  Patient ID: Vanessa Oneill MRN: [id number] DOB/AGE: [birth year] 72 y.o.  Admit date: 03/23/2022 Discharge date: 03/28/2022  Admission Diagnoses: Right lower lobe pulmonary nodule  Discharge Diagnoses:  Squamous cell carcinoma right lower lobe, Clinical and Pathologic stage IA (T1,N0)   Discharged Condition: stable  HPI:  Vanessa Oneill is seen for consultation regarding a right lower lobe lung nodule.   Vanessa Oneill is a 72 year old woman with a history of ongoing tobacco abuse, COPD, pulmonary fibrosis, hypertension, hyperlipidemia, mitral regurgitation, aortic and coronary atherosclerosis, cerebral aneurysm, sleep apnea, Raynaud's, reflux, type 2 diabetes without complication, colon adenomas, anxiety and depression.  She has smoked about a pack a day for 55 years.   She had a low-dose CT for lung cancer screening back in July which showed a cystic abnormality in the left lower lobe and a 6 mm right lower lobe lung nodule.  A follow-up scan in 6 months was done in January.  It showed no change in the left lower lobe cystic lesion.  However the right lower lobe nodule was dramatically larger at 11.6 mm in diameter.  She had some calcified hilar mediastinal adenopathy but no pathologically enlarged lymph nodes.  PET/CT showed the right lower lobe nodule was hypermetabolic with an SUV of 2.5.  There also was hypermetabolic activity in the cystic lesion on the left.  There is no mediastinal or hilar activity.  There was an area of increased activity in the rectum.   She did call with GI re: the finding in the rectum on CT, but was given an appointment in June.   She gets short of breath with exertion.  She feels like she can walk up a flight of stairs but would be short of breath. She thinks she could do it without stopping but has not tried recently. She has a frequent productive cough and wheezing.  She uses her albuterol inhaler about twice a day. She had to  stop Trelegy because it made her cough worse.  She has frequent heartburn but no exertional chest pain, pressure, or tightness. She does complain of decreased energy. She has had a poor appetite and has lost about 5 pounds over the past 3 months.  Vanessa Oneill reviewed the patient's diagnostic studies and determined surgical intervention would be beneficial to this patient. He discussed the patient's treatment options as well as the risks and benefits of surgery. Vanessa Oneill was agreeable to proceed with surgery.  Hospital Course:  Vanessa Oneill arrived at [facility] and was brought to the operating room on 03/23/22. She underwent right lower lobe lateral basilar segmentectomy and lymph node dissection. She tolerated the procedure well and was transferred to the PACU in stable condition. She had chronic thrombocytopenia so Lovenox was held. Chest tube was placed to water seal and there was no air leak seen. CXR showed bibasilar pleural effusions/atelectasis but no pneumothorax. Her chest tube was left in place for moderate drainage. It slowed over time. Her blood pressure remained soft, Nadolol was held. She is an active smoker and required aggressive pulmonary hygiene. Oxygen was slowly weaned and oxygen saturations improved. She has been started on nebulizer treatments as well. Blood sugars were difficult to control over time. Insulin and home meds were adjusted. She was volume overload with some acute pulmonary edema and given Lasix, this improved. Renal function remained within normal limits. She had an expected acute blood loss anemia which stabilized. She developed a trace right apical pneumothorax which resolved  the following day. Chest tube was removed without complication. Follow up CXR showed improved right apical pneumothorax, basilar opacities likely due to atelectasis, and improved lung aeration. Her cough was improving and she had no leukocytosis or fever. PT/OT evaluation recommended  Home PT/OT which was arranged but the patient ultimately declined all DME and home health services. She began saturating well on room air at rest but qualified for home oxygen during ambulation, Reassessment the following day showed patient saturating 91% with ambulation and she would no longer require home oxygen. Her thrombocytopenia improved to baseline. She continued to have some serosanguinous drainage from her chest tube site but there was no purulent drainage, no erythema of her incisions or sign of infection. Updated A1C showed a preoperative A1C of 7.1. Home diabetes medications would be restarted at discharge. She was felt stable for discharge.  Consults: None  Significant Diagnostic Studies:  CLINICAL DATA:  Current smoker with 65 pack-year history   EXAM: CT CHEST WITHOUT CONTRAST FOR LUNG CANCER SCREENING NODULE FOLLOW-UP   TECHNIQUE: Multidetector CT imaging of the chest was performed following the standard protocol without IV contrast.   RADIATION DOSE REDUCTION: This exam was performed according to the departmental dose-optimization program which includes automated exposure control, adjustment of the mA and/or kV according to patient size and/or use of iterative reconstruction technique.   COMPARISON:  Lung cancer screening CT dated July 25, 2021   FINDINGS: Cardiovascular: Normal heart size. No pericardial effusion. Normal caliber thoracic aorta with moderate calcified plaque.   Mediastinum/Nodes: Mildly patulous esophagus with air-fluid level. Small hiatal hernia with associated fat and trace fluid seen in the hernia sac. Calcified mediastinal and right hilar lymph nodes, likely sequela of prior granulomatous infection.   Lungs/Pleura: Central airways are patent. No consolidation, pleural effusion or pneumothorax. Moderate paraseptal and centrilobular emphysema. Lower lung predominant subpleural reticular opacities and probable bronchiolectasis. Growing solid nodule  of the right lower lobe measuring 11.6 mm on image 70 previously measured 5.6 mm.   Upper Abdomen: Cirrhotic liver morphology.  Splenomegaly.   Musculoskeletal: No chest wall mass or suspicious bone lesions identified.   IMPRESSION: 1. Growing solid nodule of the right lower lobe measuring 11.6 mm. Lung-RADS 4B, suspicious. Additional imaging evaluation or consultation with Pulmonology or Thoracic Surgery recommended. 2. Lower lung predominant subpleural reticular opacities, findings can be seen in the setting of interstitial lung disease. Correlate with pulmonary function tests. 3. Mildly patulous esophagus with air-fluid level, findings can be seen in the setting of gastroesophageal reflux or esophageal dysmotility. 4. Cirrhotic liver morphology with splenomegaly. 5. Aortic Atherosclerosis (ICD10-I70.0) and Emphysema (ICD10-J43.9).   These results will be called to the ordering clinician or representative by the Radiologist Assistant, and communication documented in the PACS or [employer].     Electronically Signed   By: Yetta Glassman M.D.   On: 01/27/2022 16:20     Treatments: surgery:    Operative Report    DATE OF PROCEDURE: 03/23/2022   PREOPERATIVE DIAGNOSIS:  Right lower lobe lung nodule.   POSTOPERATIVE DIAGNOSIS:  Squamous cell carcinoma, right lower lobe, clinical stage IA (T1, N0).   PROCEDURE:  Xi robotic-assisted right VATS lateral basilar segmentectomy, right lower lobe, lymph node sampling and intercostal nerve blocks levels 4 through 10.   SURGEON:  Revonda Standard. Roxan Hockey, MD   PATHOLOGY:   FINAL MICROSCOPIC DIAGNOSIS:  A. RIGHT LUNG, LOWER LOBE, BRONCHIAL MARGIN, EXCISION: Negative for carcinoma, margin free  B. RIGHT LUNG, LOWER LOBE, LATERAL SEGMENT, NODULE,  SEGMENTECTOMY: Invasive well to moderately differentiated squamous cell carcinoma Tumor measures 1.6 x 1.3 x 1.0 cm (pT1b) Margins free  C. LYMPH NODE, LEVEL 9,  EXCISION: Benign anthracotic stroma Negative for lymph node  D. LYMPH NODE, LEVEL 7, EXCISION: One benign lymph node with adjacent calcified nodule, negative for carcinoma (0/1)  E. LYMPH NODE, LEVEL 7 #2, EXCISION: One benign lymph node, negative for carcinoma (0/1)  F. LYMPH NODE, LEVEL 12, EXCISION: One benign lymph node, negative for carcinoma (0/1)  G. LYMPH NODE, LEVEL 12 #2, EXCISION: One benign lymph node, negative for carcinoma (0/1)  H. LYMPH NODE, LEVEL 13, EXCISION: One benign lymph node, negative for carcinoma (0/1)  I. LYMPH NODE, LEVEL 13 #2, EXCISION: One benign lymph node, negative for carcinoma (0/1)  J. LYMPH NODE, LEVEL 13 #3, EXCISION: Mucus with admixed benign bronchial cells and pulmonary macrophages  K. LYMPH NODE, 10R, EXCISION: One benign lymph node, negative for carcinoma (0/1)  L. LYMPH NODE, 4R, EXCISION: One benign lymph node, negative for carcinoma (0/1)   ONCOLOGY TABLE:  LUNG: Resection  Synchronous Tumors: Not applicable Total Number of Primary Tumors: 1 Procedure: Segmentectomy with lymph node sampling Specimen Laterality: Right Tumor Focality: Unifocal Tumor Site: Lower lobe, lateral segment Tumor Size: 1.6 x 1.3 x 1.0 cm Histologic Type: Squamous cell carcinoma Visceral Pleura Invasion: Not identified Direct Invasion of Adjacent Structures: No adjacent structures present Lymphovascular Invasion: Not identified Margins: All margins negative for invasive carcinoma      Closest Margin(s) to Invasive Carcinoma: 0.8 cm from stapled parenchymal margin Treatment Effect: No known presurgical therapy Regional Lymph Nodes:      Number of Lymph Nodes Involved: 0      Number of Lymph Nodes Examined: 9                      Nodal Sites Examined: Stations 4R, 10R, 7, 12R and 13R Distant Metastasis: Not applicable Pathologic Stage Classification (pTNM, AJCC 8th Edition): pT1b, pN0 Ancillary Studies: Available upon request Representative  Tumor Block: B1, B2, B3 Comment(s): The lymph nodes show scattered rare poorly formed granulomas.  Multiple deeper sections are reviewed of the disrupted pleural surface and the tumor does not appear to invade the pleura.  (v4.2.0.1)  Discharge Exam: Blood pressure 119/69, pulse 82, temperature 98.2 F (36.8 C), temperature source Oral, resp. rate (!) 21, height 5\' 7"  (1.702 m), weight 82.4 kg, SpO2 92 %. General appearance: alert and cooperative Neurologic: intact Heart: regular rate and rhythm, S1, S2 normal, no murmur, click, rub or gallop Lungs: Some wheezes, diminished bibasilar Abdomen: soft, non-tender; bowel sounds normal; no masses,  no organomegaly Extremities: edema trace, hx of LE swelling Wound: Some serosanguinous drainage from old chest tube site, no erythema or purulent drainage indicating infection   Disposition: Discharge disposition: 06-Home-Health Care Svc        Allergies as of 03/28/2022       Reactions   Codeine    Tylenol With Codeine #3 [acetaminophen-codeine]    Chest felt like it would burst open   Amoxicillin Itching, Rash   Lipitor [atorvastatin] Other (See Comments)   Muscle Pain   Lisinopril Cough   Penicillins Itching, Rash        Medication List     STOP taking these medications    chlorhexidine 4 % external liquid Commonly known as: Hibiclens   erythromycin ophthalmic ointment   nadolol 80 MG tablet Commonly known as: CORGARD  TAKE these medications    albuterol 108 (90 Base) MCG/ACT inhaler Commonly known as: VENTOLIN HFA Inhale 1-2 puffs into the lungs every 6 (six) hours as needed for wheezing or shortness of breath.   ALPRAZolam 0.25 MG tablet Commonly known as: XANAX Take 0.25 mg by mouth daily as needed for anxiety or sleep.   busPIRone 7.5 MG tablet Commonly known as: BUSPAR Take 7.5 mg by mouth 2 (two) times daily.   colestipol 1 g tablet Commonly known as: COLESTID Take 1 g by mouth 2 (two) times  daily.   cyclobenzaprine 10 MG tablet Commonly known as: FLEXERIL Take 10 mg by mouth 3 (three) times daily as needed for muscle spasms.   gabapentin 100 MG capsule Commonly known as: NEURONTIN Take 1 capsule (100 mg total) by mouth See admin instructions. Take 100 mg by mouth in the morning, 300 mg in the afternoon and 100 mg at bedtime. What changed: how much to take   glimepiride 4 MG tablet Commonly known as: AMARYL Take 4 mg by mouth daily with breakfast.   HYDROcodone-acetaminophen 5-325 MG tablet Commonly known as: NORCO/VICODIN Take 1 tablet by mouth every 8 (eight) hours as needed for moderate pain.   insulin NPH Human 100 UNIT/ML injection Commonly known as: NOVOLIN N Inject 0.1 mLs (10 Units total) into the skin 2 (two) times daily before a meal. Use 30 units in the morning, 20 units during the day and 30 units at night. What changed:  how much to take when to take this reasons to take this additional instructions   ipratropium 0.06 % nasal spray Commonly known as: ATROVENT Place 2 sprays into both nostrils in the morning and at bedtime.   metFORMIN 500 MG 24 hr tablet Commonly known as: GLUCOPHAGE-XR Take 1,000 mg by mouth daily with supper.   NONFORMULARY OR COMPOUNDED ITEM Apply 1 Pump topically 3 (three) times daily as needed (neuropathy in feet). Baclofen 2%, Diclofenac 5%, Gabapentin 6%, Tetracaine 3%   omeprazole 40 MG capsule Commonly known as: PRILOSEC Take 40 mg by mouth daily.   spironolactone 25 MG tablet Commonly known as: ALDACTONE Take 25 mg by mouth daily.   traZODone 50 MG tablet Commonly known as: DESYREL Take 50 mg by mouth at bedtime.               Durable Medical Equipment  (From admission, onward)           Start     Ordered   03/28/22 0816  For home use only DME 3 n 1  Once        03/28/22 0816   03/27/22 1200  For home use only DME oxygen  Once       Question Answer Comment  Length of Need 6 Months   Mode or  (Route) Nasal cannula   Liters per Minute 2   Oxygen delivery system Gas      03/27/22 1200   03/27/22 1040  For home use only DME Walker rolling  Once       Question Answer Comment  Walker: With [place] Wheels   Patient needs a walker to treat with the following condition Imbalance      03/27/22 1042            Follow-up Information     Melrose Nakayama, MD Follow up on 04/04/2022.   Specialty: Cardiothoracic Surgery Why: Appointment is at 4:30PM Contact information: [address] [place] [address] [phone]  [facility] Follow up on 04/04/2022.   Why: To get chest xray at 3:30PM Contact information: [place]                Signed: Magdalene River, PA-C  03/28/2022, 1:02 PM

## 2022-03-24 NOTE — Progress Notes (Signed)
Paged PA on call regarding drainage from the chest tube, RN applied pressure dressing including vaseline gauze per PA.   Ezra Sites, RN

## 2022-03-24 NOTE — Plan of Care (Signed)
  Problem: Education: Goal: Knowledge of General Education information will improve Description: Including pain rating scale, medication(s)/side effects and non-pharmacologic comfort measures Outcome: Progressing   Problem: Health Behavior/Discharge Planning: Goal: Ability to manage health-related needs will improve Outcome: Progressing   Problem: Clinical Measurements: Goal: Ability to maintain clinical measurements within normal limits will improve Outcome: Progressing Goal: Will remain free from infection Outcome: Progressing Goal: Diagnostic test results will improve Outcome: Progressing Goal: Respiratory complications will improve Outcome: Progressing Goal: Cardiovascular complication will be avoided Outcome: Progressing   Problem: Activity: Goal: Risk for activity intolerance will decrease Outcome: Progressing   Problem: Nutrition: Goal: Adequate nutrition will be maintained Outcome: Progressing   Problem: Coping: Goal: Level of anxiety will decrease Outcome: Progressing   Problem: Elimination: Goal: Will not experience complications related to bowel motility Outcome: Progressing Goal: Will not experience complications related to urinary retention Outcome: Progressing   Problem: Pain Managment: Goal: General experience of comfort will improve Outcome: Progressing   Problem: Safety: Goal: Ability to remain free from injury will improve Outcome: Progressing   Problem: Skin Integrity: Goal: Risk for impaired skin integrity will decrease Outcome: Progressing   Problem: Education: Goal: Ability to describe self-care measures that may prevent or decrease complications (Diabetes Survival Skills Education) will improve Outcome: Progressing Goal: Individualized Educational Video(s) Outcome: Progressing   Problem: Coping: Goal: Ability to adjust to condition or change in health will improve Outcome: Progressing   Problem: Fluid Volume: Goal: Ability to  maintain a balanced intake and output will improve Outcome: Progressing   Problem: Health Behavior/Discharge Planning: Goal: Ability to identify and utilize available resources and services will improve Outcome: Progressing Goal: Ability to manage health-related needs will improve Outcome: Progressing   Problem: Metabolic: Goal: Ability to maintain appropriate glucose levels will improve Outcome: Progressing   Problem: Nutritional: Goal: Maintenance of adequate nutrition will improve Outcome: Progressing Goal: Progress toward achieving an optimal weight will improve Outcome: Progressing   Problem: Skin Integrity: Goal: Risk for impaired skin integrity will decrease Outcome: Progressing   Problem: Tissue Perfusion: Goal: Adequacy of tissue perfusion will improve Outcome: Progressing   Problem: Education: Goal: Knowledge of disease or condition will improve Outcome: Progressing Goal: Knowledge of the prescribed therapeutic regimen will improve Outcome: Progressing   Problem: Activity: Goal: Risk for activity intolerance will decrease Outcome: Progressing   Problem: Cardiac: Goal: Will achieve and/or maintain hemodynamic stability Outcome: Progressing

## 2022-03-24 NOTE — Progress Notes (Addendum)
Vanessa Oneill       [place],Vanessa Oneill             [phone]      1 Day Post-Op Procedure(s) (LRB): XI ROBOTIC ASSISTED THORACOSCOPY-RIGHT LOWER LOBE LATERAL BASILAR SEGMENTECTOMY (Right) INTERCOSTAL NERVE BLOCK LYMPH NODE BIOPSY Subjective: Patient states she is doing ok this morning, has some pain in her right shoulder when she coughs.  Objective: Vital signs in last 24 hours: Temp:  [97.2 F (36.2 C)-98.3 F (36.8 C)] 97.9 F (36.6 C) (03/22 0733) Pulse Rate:  [54-67] 64 (03/22 0733) Cardiac Rhythm: Normal sinus rhythm (03/21 1900) Resp:  [11-22] 19 (03/22 0733) BP: (97-128)/(49-73) 117/51 (03/22 0733) SpO2:  [91 %-97 %] 96 % (03/22 0733) Arterial Line BP: (123-145)/(43-59) 139/54 (03/21 1430) Weight:  [82.4 kg] 82.4 kg (03/21 1526)  Hemodynamic parameters for last 24 hours:    Intake/Output from previous day: 03/21 0701 - 03/22 0700 In: 3078.1 [P.O.:240; I.V.:2588.1; IV Piggyback:250] Out: 2255 [Urine:1650; Blood:150; Chest Tube:455] Intake/Output this shift: No intake/output data recorded.  General appearance: alert, cooperative, and no distress Neurologic: intact Heart: regular rate and rhythm, S1, S2 normal, no murmur, click, rub or gallop Lungs: Diminished bibasilar Abdomen: soft, non-tender; bowel sounds normal; no masses,  no organomegaly Wound: Clean and dry dressing in place  Lab Results: Recent Labs    03/21/22 1330 03/24/22 0019  WBC 4.1 4.4  HGB 12.5 9.9*  HCT 37.1 28.8*  PLT 68* 57*   BMET:  Recent Labs    03/21/22 1330 03/24/22 0019  NA 131* 125*  K 4.3 4.3  CL 104 100  CO2 18* 17*  GLUCOSE 285* 377*  BUN 12 16  CREATININE 0.97 1.04*  CALCIUM 8.7* 7.8*    PT/INR:  Recent Labs    03/21/22 1330  LABPROT 13.9  INR 1.1   ABG    Component Value Date/Time   PHART 7.4 03/21/2022 1332   HCO3 20.4 03/21/2022 1332   ACIDBASEDEF 3.6 (H) 03/21/2022 1332   O2SAT 98.5 03/21/2022 1332   CBG (last 3)  Recent  Labs    03/23/22 1607 03/23/22 2120 03/24/22 0613  GLUCAP 210* 446* 313*    Assessment/Plan: S/P Procedure(s) (LRB): XI ROBOTIC ASSISTED THORACOSCOPY-RIGHT LOWER LOBE LATERAL BASILAR SEGMENTECTOMY (Right) INTERCOSTAL NERVE BLOCK LYMPH NODE BIOPSY  Neuro: Pain ok control. Pt on Flexeril, gabapentin and Norco at home.   CV: HR 60s, SBP 101-117. Will hold Nadolol for now.   Pulm: CT to water seal. CT output 455cc/24hrs. No air leak seen. CXR shows no pneumothorax, some bibasilar pleural effusions/atelectasis and mild pulmonary edema. Saturating 96% on 2L [place] this AM. Encouraged IS and ambulation. Leave CT in place for drainage today.  GI: Increase appetite as tolerated  Endo: Hx of uncontrolled DM, Preop A1C 9.1. CBGs 210/446/313. Will restart home meds.   Renal: Cr 1.04. UO 1650cc/24hrs  Hyponatremia: Na 125, monitor  Expected postop ABLA: H/H 9.9/28.8  Chronic thrombocytopenia: Plt 57,000 this AM. About 68,000 at baseline. Holding Lovenox.  DVT Prophylaxis: SCDs  Dsipo: Leave CT in place today, hopefully d/c CT tomorrow. Work on better CBG control.   LOS: 1 day    Vanessa River, PA-C 03/24/2022 Patient seen and examined, agree with above No air leak but moderate drainage from CT- dc when < 400 ml for 24 hours Hyperglycemia- adjust Levemir, continue SSI, restart PO meds Ambulate No enoxaparin due to baseline thrombocytopenia  Remo Lipps C. Roxan Hockey, MD [facility] [phone]

## 2022-03-25 ENCOUNTER — Inpatient Hospital Stay (HOSPITAL_COMMUNITY): Payer: Medicare Other

## 2022-03-25 LAB — GLUCOSE, CAPILLARY
Glucose-Capillary: 125 mg/dL — ABNORMAL HIGH (ref 70–99)
Glucose-Capillary: 135 mg/dL — ABNORMAL HIGH (ref 70–99)
Glucose-Capillary: 59 mg/dL — ABNORMAL LOW (ref 70–99)
Glucose-Capillary: 59 mg/dL — ABNORMAL LOW (ref 70–99)
Glucose-Capillary: 76 mg/dL (ref 70–99)
Glucose-Capillary: 87 mg/dL (ref 70–99)
Glucose-Capillary: 87 mg/dL (ref 70–99)

## 2022-03-25 LAB — COMPREHENSIVE METABOLIC PANEL
ALT: 22 U/L (ref 0–44)
AST: 32 U/L (ref 15–41)
Albumin: 2.7 g/dL — ABNORMAL LOW (ref 3.5–5.0)
Alkaline Phosphatase: 75 U/L (ref 38–126)
Anion gap: 5 (ref 5–15)
BUN: 17 mg/dL (ref 8–23)
CO2: 22 mmol/L (ref 22–32)
Calcium: 7.7 mg/dL — ABNORMAL LOW (ref 8.9–10.3)
Chloride: 102 mmol/L (ref 98–111)
Creatinine, Ser: 0.89 mg/dL (ref 0.44–1.00)
GFR, Estimated: >60 mL/min (ref >60)
Glucose, Bld: 118 mg/dL — ABNORMAL HIGH (ref 70–99)
Potassium: 4.2 mmol/L (ref 3.5–5.1)
Sodium: 129 mmol/L — ABNORMAL LOW (ref 135–145)
Total Bilirubin: 0.9 mg/dL (ref 0.3–1.2)
Total Protein: 5.3 g/dL — ABNORMAL LOW (ref 6.5–8.1)

## 2022-03-25 LAB — CBC
HCT: 28.5 % — ABNORMAL LOW (ref 36.0–46.0)
Hemoglobin: 9.6 g/dL — ABNORMAL LOW (ref 12.0–15.0)
MCH: 31 pg (ref 26.0–34.0)
MCHC: 33.7 g/dL (ref 30.0–36.0)
MCV: 91.9 fL (ref 80.0–100.0)
Platelets: 48 10*3/uL — ABNORMAL LOW (ref 150–400)
RBC: 3.1 MIL/uL — ABNORMAL LOW (ref 3.87–5.11)
RDW: 14.6 % (ref 11.5–15.5)
WBC: 4.4 10*3/uL (ref 4.0–10.5)
nRBC: 0 % (ref 0.0–0.2)

## 2022-03-25 LAB — HEMOGLOBIN A1C
Hgb A1c MFr Bld: 7.1 % — ABNORMAL HIGH (ref 4.8–5.6)
Mean Plasma Glucose: 157 mg/dL

## 2022-03-25 MED ORDER — POTASSIUM CHLORIDE CRYS ER 20 MEQ PO TBCR
20.0000 meq | EXTENDED_RELEASE_TABLET | Freq: Two times a day (BID) | ORAL | Status: AC
  Administered 2022-03-25 (×2): 20 meq via ORAL
  Filled 2022-03-25 (×2): qty 1

## 2022-03-25 MED ORDER — FUROSEMIDE 10 MG/ML IJ SOLN
40.0000 mg | Freq: Once | INTRAMUSCULAR | Status: AC
  Administered 2022-03-25: 40 mg via INTRAVENOUS
  Filled 2022-03-25: qty 4

## 2022-03-25 MED ORDER — INSULIN DETEMIR 100 UNIT/ML ~~LOC~~ SOLN
35.0000 [IU] | Freq: Two times a day (BID) | SUBCUTANEOUS | Status: DC
  Administered 2022-03-25: 35 [IU] via SUBCUTANEOUS
  Filled 2022-03-25 (×4): qty 0.35

## 2022-03-25 NOTE — Progress Notes (Addendum)
[address]       [place] [address]             [phone]     2 Days Post-Op Procedure(s) (LRB): XI ROBOTIC ASSISTED THORACOSCOPY-RIGHT LOWER LOBE LATERAL BASILAR SEGMENTECTOMY (Right) INTERCOSTAL NERVE BLOCK LYMPH NODE BIOPSY Subjective: Feels fair, mildly prod cough  Objective: Vital signs in last 24 hours: Temp:  [97.6 F (36.4 C)-98.4 F (36.9 C)] 98 F (36.7 C) (03/23 0742) Pulse Rate:  [57-64] 61 (03/23 0745) Cardiac Rhythm: Sinus bradycardia (03/23 0733) Resp:  [12-22] 12 (03/23 0742) BP: (93-128)/(45-57) 93/53 (03/23 0742) SpO2:  [95 %-96 %] 95 % (03/23 0742)  Hemodynamic parameters for last 24 hours:    Intake/Output from previous day: 03/22 0701 - 03/23 0700 In: 1288.7 [P.O.:600; I.V.:688.7] Out: 1160 [Urine:250; Chest Tube:910] Intake/Output this shift: No intake/output data recorded.  General appearance: alert, cooperative, fatigued, and no distress Heart: regular rate and rhythm Lungs: scattered crackles Abdomen: soft, non-tender Extremities: no edema or calf tenderness Wound: healing without signs of infection  Lab Results: Recent Labs    03/24/22 0019 03/25/22 0012  WBC 4.4 4.4  HGB 9.9* 9.6*  HCT 28.8* 28.5*  PLT 57* 48*   BMET:  Recent Labs    03/24/22 0019 03/25/22 0012  NA 125* 129*  K 4.3 4.2  CL 100 102  CO2 17* 22  GLUCOSE 377* 118*  BUN 16 17  CREATININE 1.04* 0.89  CALCIUM 7.8* 7.7*    PT/INR: No results for input(s): "LABPROT", "INR" in the last 72 hours. ABG    Component Value Date/Time   PHART 7.4 03/21/2022 1332   HCO3 20.4 03/21/2022 1332   ACIDBASEDEF 3.6 (H) 03/21/2022 1332   O2SAT 98.5 03/21/2022 1332   CBG (last 3)  Recent Labs    03/24/22 2105 03/25/22 0610 03/25/22 0647  GLUCAP 215* 59* 87    Meds Scheduled Meds:  acetaminophen  1,000 mg Oral Q6H   Or   acetaminophen (TYLENOL) oral liquid 160 mg/5 mL  1,000 mg Oral Q6H   bisacodyl  10 mg Oral Daily   busPIRone  7.5  mg Oral BID   Chlorhexidine Gluconate Cloth  6 each Topical Daily   colestipol  1 g Oral BID   gabapentin  100 mg Oral BID   gabapentin  300 mg Oral Q24H   glimepiride  4 mg Oral Q breakfast   insulin aspart  0-15 Units Subcutaneous TID WC   insulin aspart  0-5 Units Subcutaneous QHS   insulin detemir  40 Units Subcutaneous BID   ipratropium  2 spray Each Nare BID   ketorolac  15 mg Intravenous Q6H   metFORMIN  1,000 mg Oral Q supper   pantoprazole  40 mg Oral Daily   senna-docusate  1 tablet Oral QHS   spironolactone  25 mg Oral Daily   traZODone  50 mg Oral QHS   Continuous Infusions:  sodium chloride 10 mL/hr at 03/25/22 0537   PRN Meds:.albuterol, ALPRAZolam, cyclobenzaprine, fentaNYL (SUBLIMAZE) injection, ondansetron (ZOFRAN) IV, mouth rinse, oxyCODONE  Xrays DG Chest Port 1 View  Result Date: 03/25/2022 CLINICAL DATA:  Table formatting from the original note was not included. S/P partial lobectomy of lung EXAM: PORTABLE CHEST - 1 VIEW COMPARISON:  03/24/2022 FINDINGS: Right chest tube directed towards the apex with no pneumothorax, small amount of subcutaneous emphysema laterally. Peripheral interstitial opacities bilaterally slightly increased. Poorly marginated airspace opacities at the right lung base stable. Heart  size and mediastinal contours are within normal limits. Blunting of lateral costophrenic angles. Visualized bones unremarkable. IMPRESSION: 1. Right chest tube without pneumothorax. 2. Slight increase in peripheral interstitial opacities. Electronically Signed   By: Lucrezia Europe M.D.   On: 03/25/2022 07:39   DG Chest Port 1 View  Result Date: 03/24/2022 CLINICAL DATA:  [id number] S/P partial lobectomy of lung [id number] EXAM: PORTABLE CHEST - 1 VIEW COMPARISON:  03/23/2022 FINDINGS: Some interval improvement in the interstitial edema or infiltrates. Patchy airspace opacities at the right lung base persist. Heart size and mediastinal contours are within normal limits. No  effusion. Visualized bones unremarkable. Right chest drain stable in position with no pneumothorax. IMPRESSION: Some interval improvement in interstitial edema or infiltrates. Electronically Signed   By: Lucrezia Europe M.D.   On: 03/24/2022 08:59   DG Chest Port 1 View  Result Date: 03/23/2022 CLINICAL DATA:  Status post partial right lower lobectomy EXAM: PORTABLE CHEST 1 VIEW COMPARISON:  Chest radiograph dated 03/21/2022, CT chest dated 01/25/2022 FINDINGS: Lines/tubes: Right apical pleural catheter. Chest: Mild bilateral interstitial opacities. Bibasilar patchy opacities. Pleura: Trace bilateral pleural effusions. No definite pneumothorax. Heart/mediastinum: Similar enlarged cardiomediastinal silhouette. Bones: No acute osseous abnormality. IMPRESSION: 1. Right apical pleural catheter in place. No definite pneumothorax. 2. Mild pulmonary edema and trace bilateral pleural effusions. 3. Bibasilar opacities, likely atelectasis. Electronically Signed   By: Darrin Nipper M.D.   On: 03/23/2022 13:59    Assessment/Plan: S/P Procedure(s) (LRB): XI ROBOTIC ASSISTED THORACOSCOPY-RIGHT LOWER LOBE LATERAL BASILAR SEGMENTECTOMY (Right) INTERCOSTAL NERVE BLOCK LYMPH NODE BIOPSY  POD#2  1 afeb, VSS sinus rhythm/brady 2 O2 sats ok on 2 liters 3 CT 910 cc/24 h- keep in place, no air leak 4 BS quite variable 59-339 range/ 24 h- trend is improved, watch on current dosing/home meds, control as outpatient has been generally poor  5 hyponatremia/pseudohyponatremia component w/elev blood sugars- trending towards baseline 6 normal renal fxn 7 protein -cal malnutrition- encourage healthy  nutrition 8 expected ABLA- stable- not at transfusion threshold 9 chronic thrombocytopenia, not on lovenox, cont SCD's 10 CXR - Right chest tube without pneumothorax, slight increase in peripheral interstitial opacities. 11 push rehab and pulm hygiene as able     LOS: 2 days    John Giovanni PA-C Pager [phone] 03/25/2022    Chart reviewed, patient examined, agree with above. CXR shows some interstitial edema.  Chest tube output still high. Will keep in for now. She has some puffiness and wt was up 10 lbs postop. Will give her some lasix.

## 2022-03-26 ENCOUNTER — Inpatient Hospital Stay (HOSPITAL_COMMUNITY): Payer: Medicare Other

## 2022-03-26 LAB — GLUCOSE, CAPILLARY
Glucose-Capillary: 51 mg/dL — ABNORMAL LOW (ref 70–99)
Glucose-Capillary: 61 mg/dL — ABNORMAL LOW (ref 70–99)
Glucose-Capillary: 111 mg/dL — ABNORMAL HIGH (ref 70–99)
Glucose-Capillary: 181 mg/dL — ABNORMAL HIGH (ref 70–99)
Glucose-Capillary: 147 mg/dL — ABNORMAL HIGH (ref 70–99)
Glucose-Capillary: 122 mg/dL — ABNORMAL HIGH (ref 70–99)

## 2022-03-26 MED ORDER — INSULIN DETEMIR 100 UNIT/ML ~~LOC~~ SOLN
30.0000 [IU] | Freq: Two times a day (BID) | SUBCUTANEOUS | Status: DC
  Administered 2022-03-26 (×2): 30 [IU] via SUBCUTANEOUS
  Filled 2022-03-26 (×4): qty 0.3

## 2022-03-26 MED ORDER — IPRATROPIUM-ALBUTEROL 0.5-2.5 (3) MG/3ML IN SOLN
3.0000 mL | Freq: Three times a day (TID) | RESPIRATORY_TRACT | Status: DC
  Administered 2022-03-27: 3 mL via RESPIRATORY_TRACT
  Filled 2022-03-26: qty 3

## 2022-03-26 MED ORDER — IPRATROPIUM-ALBUTEROL 0.5-2.5 (3) MG/3ML IN SOLN
3.0000 mL | Freq: Four times a day (QID) | RESPIRATORY_TRACT | Status: DC
  Administered 2022-03-26 (×2): 3 mL via RESPIRATORY_TRACT
  Filled 2022-03-26 (×2): qty 3

## 2022-03-26 MED ORDER — IPRATROPIUM-ALBUTEROL 0.5-2.5 (3) MG/3ML IN SOLN
3.0000 mL | Freq: Four times a day (QID) | RESPIRATORY_TRACT | Status: DC
  Administered 2022-03-26: 3 mL via RESPIRATORY_TRACT
  Filled 2022-03-26: qty 3

## 2022-03-26 NOTE — Plan of Care (Signed)

## 2022-03-26 NOTE — Progress Notes (Addendum)
3 Days Post-Op Procedure(s) (LRB): XI ROBOTIC ASSISTED THORACOSCOPY-RIGHT LOWER LOBE LATERAL BASILAR SEGMENTECTOMY (Right) INTERCOSTAL NERVE BLOCK LYMPH NODE BIOPSY Subjective: Frequent cough, somewhat productive  Objective: Vital signs in last 24 hours: Temp:  [97.7 F (36.5 C)-98.2 F (36.8 C)] 97.8 F (36.6 C) (03/24 0351) Pulse Rate:  [58-64] 64 (03/24 0351) Cardiac Rhythm: Normal sinus rhythm (03/24 0731) Resp:  [15-17] 17 (03/24 0351) BP: (98-127)/(57-64) 112/64 (03/24 0351) SpO2:  [94 %-97 %] 97 % (03/24 0351)  Hemodynamic parameters for last 24 hours:    Intake/Output from previous day: 03/23 0701 - 03/24 0700 In: 600 [P.O.:600] Out: 380 [Chest Tube:380] Intake/Output this shift: No intake/output data recorded.  General appearance: alert, cooperative, fatigued, and no distress Heart: regular rate and rhythm Lungs: scattered crackles Abdomen: soft, some tenderness Extremities: no edema Wound: incis ok  Lab Results: Recent Labs    03/24/22 0019 03/25/22 0012  WBC 4.4 4.4  HGB 9.9* 9.6*  HCT 28.8* 28.5*  PLT 57* 48*   BMET:  Recent Labs    03/24/22 0019 03/25/22 0012  NA 125* 129*  K 4.3 4.2  CL 100 102  CO2 17* 22  GLUCOSE 377* 118*  BUN 16 17  CREATININE 1.04* 0.89  CALCIUM 7.8* 7.7*    PT/INR: No results for input(s): "LABPROT", "INR" in the last 72 hours. ABG    Component Value Date/Time   PHART 7.4 03/21/2022 1332   HCO3 20.4 03/21/2022 1332   ACIDBASEDEF 3.6 (H) 03/21/2022 1332   O2SAT 98.5 03/21/2022 1332   CBG (last 3)  Recent Labs    03/26/22 0617 03/26/22 0643 03/26/22 0807  GLUCAP 51* 61* 111*    Meds Scheduled Meds:  acetaminophen  1,000 mg Oral Q6H   Or   acetaminophen (TYLENOL) oral liquid 160 mg/5 mL  1,000 mg Oral Q6H   bisacodyl  10 mg Oral Daily   busPIRone  7.5 mg Oral BID   Chlorhexidine Gluconate Cloth  6 each Topical Daily   colestipol  1 g Oral BID   gabapentin  100 mg Oral BID   gabapentin  300 mg  Oral Q24H   glimepiride  4 mg Oral Q breakfast   insulin aspart  0-15 Units Subcutaneous TID WC   insulin aspart  0-5 Units Subcutaneous QHS   insulin detemir  35 Units Subcutaneous BID   ipratropium  2 spray Each Nare BID   metFORMIN  1,000 mg Oral Q supper   pantoprazole  40 mg Oral Daily   senna-docusate  1 tablet Oral QHS   spironolactone  25 mg Oral Daily   traZODone  50 mg Oral QHS   Continuous Infusions:  sodium chloride 10 mL/hr at 03/25/22 0537   PRN Meds:.albuterol, ALPRAZolam, cyclobenzaprine, fentaNYL (SUBLIMAZE) injection, ondansetron (ZOFRAN) IV, mouth rinse, oxyCODONE  Xrays DG Chest Port 1 View  Result Date: 03/25/2022 CLINICAL DATA:  Table formatting from the original note was not included. S/P partial lobectomy of lung EXAM: PORTABLE CHEST - 1 VIEW COMPARISON:  03/24/2022 FINDINGS: Right chest tube directed towards the apex with no pneumothorax, small amount of subcutaneous emphysema laterally. Peripheral interstitial opacities bilaterally slightly increased. Poorly marginated airspace opacities at the right lung base stable. Heart size and mediastinal contours are within normal limits. Blunting of lateral costophrenic angles. Visualized bones unremarkable. IMPRESSION: 1. Right chest tube without pneumothorax. 2. Slight increase in peripheral interstitial opacities. Electronically Signed   By: Lucrezia Europe M.D.   On: 03/25/2022 07:39    Assessment/Plan: S/P  Procedure(s) (LRB): XI ROBOTIC ASSISTED THORACOSCOPY-RIGHT LOWER LOBE LATERAL BASILAR SEGMENTECTOMY (Right) INTERCOSTAL NERVE BLOCK LYMPH NODE BIOPSY POD#3   1 afeb, VSS, sinus rhythm 2 sats good on 2 liters 3 CT 380 cc/24h, poss remove later today v tomorrow 4 voiding well, not measured 5 BS control significantly improved but a  low at times- will decrease levimir a little further 6 will check CXR 7 repeat BMET in am 8 add flutter valve, duonebs    LOS: 3 days    John Giovanni PA-C Pager [phone] 03/26/2022   Chart reviewed, patient examined, agree with above. CXR stable. Tiny apical ptx on right that is not signifcant. Chest tube output is decreasing and should be able to remove tomorrow. She still has rattling cough. Continue IS.

## 2022-03-26 NOTE — Plan of Care (Signed)
  Problem: Education: Goal: Knowledge of General Education information will improve Description: Including pain rating scale, medication(s)/side effects and non-pharmacologic comfort measures Outcome: Progressing   Problem: Health Behavior/Discharge Planning: Goal: Ability to manage health-related needs will improve Outcome: Progressing   Problem: Clinical Measurements: Goal: Ability to maintain clinical measurements within normal limits will improve Outcome: Progressing Goal: Will remain free from infection Outcome: Progressing Goal: Diagnostic test results will improve Outcome: Progressing Goal: Respiratory complications will improve Outcome: Progressing Goal: Cardiovascular complication will be avoided Outcome: Progressing   Problem: Activity: Goal: Risk for activity intolerance will decrease Outcome: Progressing   Problem: Coping: Goal: Level of anxiety will decrease Outcome: Progressing   Problem: Elimination: Goal: Will not experience complications related to bowel motility Outcome: Progressing Goal: Will not experience complications related to urinary retention Outcome: Progressing   Problem: Pain Managment: Goal: General experience of comfort will improve Outcome: Progressing   Problem: Safety: Goal: Ability to remain free from injury will improve Outcome: Progressing   Problem: Skin Integrity: Goal: Risk for impaired skin integrity will decrease Outcome: Progressing   Problem: Education: Goal: Ability to describe self-care measures that may prevent or decrease complications (Diabetes Survival Skills Education) will improve Outcome: Progressing Goal: Individualized Educational Video(s) Outcome: Progressing   Problem: Coping: Goal: Ability to adjust to condition or change in health will improve Outcome: Progressing   Problem: Fluid Volume: Goal: Ability to maintain a balanced intake and output will improve Outcome: Progressing   Problem: Health  Behavior/Discharge Planning: Goal: Ability to identify and utilize available resources and services will improve Outcome: Progressing Goal: Ability to manage health-related needs will improve Outcome: Progressing   Problem: Metabolic: Goal: Ability to maintain appropriate glucose levels will improve Outcome: Progressing   Problem: Nutritional: Goal: Maintenance of adequate nutrition will improve Outcome: Progressing Goal: Progress toward achieving an optimal weight will improve Outcome: Progressing   Problem: Skin Integrity: Goal: Risk for impaired skin integrity will decrease Outcome: Progressing   Problem: Tissue Perfusion: Goal: Adequacy of tissue perfusion will improve Outcome: Progressing   Problem: Education: Goal: Knowledge of disease or condition will improve Outcome: Progressing Goal: Knowledge of the prescribed therapeutic regimen will improve Outcome: Progressing   Problem: Activity: Goal: Risk for activity intolerance will decrease Outcome: Progressing   Problem: Cardiac: Goal: Will achieve and/or maintain hemodynamic stability Outcome: Progressing   Problem: Clinical Measurements: Goal: Postoperative complications will be avoided or minimized Outcome: Progressing   Problem: Respiratory: Goal: Respiratory status will improve Outcome: Progressing   Problem: Pain Management: Goal: Pain level will decrease Outcome: Progressing   Problem: Skin Integrity: Goal: Wound healing without signs and symptoms infection will improve Outcome: Progressing   Problem: Education: Goal: Knowledge of disease or condition will improve Outcome: Progressing Goal: Knowledge of the prescribed therapeutic regimen will improve Outcome: Progressing   Problem: Activity: Goal: Risk for activity intolerance will decrease Outcome: Progressing   Problem: Cardiac: Goal: Will achieve and/or maintain hemodynamic stability Outcome: Progressing

## 2022-03-27 ENCOUNTER — Inpatient Hospital Stay (HOSPITAL_COMMUNITY): Payer: Medicare Other

## 2022-03-27 LAB — BASIC METABOLIC PANEL
Anion gap: 7 (ref 5–15)
BUN: 23 mg/dL (ref 8–23)
CO2: 22 mmol/L (ref 22–32)
Calcium: 8.5 mg/dL — ABNORMAL LOW (ref 8.9–10.3)
Chloride: 100 mmol/L (ref 98–111)
Creatinine, Ser: 0.98 mg/dL (ref 0.44–1.00)
GFR, Estimated: >60 mL/min (ref >60)
Glucose, Bld: 54 mg/dL — ABNORMAL LOW (ref 70–99)
Potassium: 4.9 mmol/L (ref 3.5–5.1)
Sodium: 129 mmol/L — ABNORMAL LOW (ref 135–145)

## 2022-03-27 LAB — GLUCOSE, CAPILLARY
Glucose-Capillary: 44 mg/dL — CL (ref 70–99)
Glucose-Capillary: 49 mg/dL — ABNORMAL LOW (ref 70–99)
Glucose-Capillary: 231 mg/dL — ABNORMAL HIGH (ref 70–99)
Glucose-Capillary: 242 mg/dL — ABNORMAL HIGH (ref 70–99)
Glucose-Capillary: 234 mg/dL — ABNORMAL HIGH (ref 70–99)
Glucose-Capillary: 177 mg/dL — ABNORMAL HIGH (ref 70–99)
Glucose-Capillary: 78 mg/dL (ref 70–99)
Glucose-Capillary: 179 mg/dL — ABNORMAL HIGH (ref 70–99)

## 2022-03-27 MED ORDER — INSULIN DETEMIR 100 UNIT/ML ~~LOC~~ SOLN: 25.0000 [IU] | Freq: Two times a day (BID) | SUBCUTANEOUS | Status: DC

## 2022-03-27 MED ORDER — IPRATROPIUM-ALBUTEROL 0.5-2.5 (3) MG/3ML IN SOLN: 3.0000 mL | Freq: Four times a day (QID) | RESPIRATORY_TRACT | Status: DC | PRN

## 2022-03-27 MED ORDER — DEXTROSE 50 % IV SOLN
INTRAVENOUS | Status: AC
  Administered 2022-03-27: 50 mL
  Filled 2022-03-27: qty 50

## 2022-03-27 MED ORDER — OXYCODONE HCL 5 MG PO TABS
5.0000 mg | ORAL_TABLET | Freq: Three times a day (TID) | ORAL | Status: DC | PRN
  Administered 2022-03-28 (×2): 5 mg via ORAL
  Filled 2022-03-27 (×2): qty 1

## 2022-03-27 MED ORDER — INSULIN DETEMIR 100 UNIT/ML ~~LOC~~ SOLN
20.0000 [IU] | Freq: Two times a day (BID) | SUBCUTANEOUS | Status: DC
  Administered 2022-03-27: 20 [IU] via SUBCUTANEOUS
  Filled 2022-03-27 (×4): qty 0.2

## 2022-03-27 MED ORDER — GLUCOSE 40 % PO GEL
ORAL | Status: AC
  Filled 2022-03-27: qty 1.21

## 2022-03-27 NOTE — Progress Notes (Signed)
  SATURATION QUALIFICATIONS: (This note is used to comply with regulatory documentation for home oxygen)   Patient Saturations on Room Air at Rest = 89-90%   Patient Saturations on Room Air while Ambulating = 85-87%   Patient Saturations on 2 Liters of oxygen while Ambulating = 95%   Please briefly explain why patient needs home oxygen: Patient unable to maintain O2 saturations without supplemental O2

## 2022-03-27 NOTE — Evaluation (Signed)
Occupational Therapy Evaluation Patient Details Name: Vanessa Oneill MRN: [id number] DOB: 08/26/1950 Today's Date: 03/27/2022   History of Present Illness Patient is 72 y.o. female diagnoses with RLL Lung nodule now s/p robotic-assisted right VATS lateral basilar segmentectomy of right lower lobe on 03/23/22. PMH significant for anxiety, OA, COPD, Depression, DM, HLD, HTN, lumbago, non-alcoholic-cirrhosis, Raynaud's.   Clinical Impression   Patient admitted for the diagnosis and procedure above.  PTA she lives alone and cares for her animals.  Patient has a friend who will be staying with her while she is recovering.  Currently she is needing up to [place] for ADL and in room mobility.  OT is indicated in the acute setting to maximize her functional independence and assist with eventual transition home.  Home based OT can be considered, if the patient is agreeable.       Recommendations for follow up therapy are one component of a multi-disciplinary discharge planning process, led by the attending physician.  Recommendations may be updated based on patient status, additional functional criteria and insurance authorization.   Assistance Recommended at Discharge Intermittent Supervision/Assistance  Patient can return home with the following Assist for transportation;Assistance with cooking/housework;A little help with walking and/or transfers;A little help with bathing/dressing/bathroom    Functional Status Assessment  Patient has had a recent decline in their functional status and demonstrates the ability to make significant improvements in function in a reasonable and predictable amount of time.  Equipment Recommendations  Tub/shower seat    Recommendations for Other Services       Precautions / Restrictions Precautions Precautions: Fall Restrictions Weight Bearing Restrictions: No      Mobility Bed Mobility Overal bed mobility: Needs Assistance Bed Mobility: Supine to Sit, Sit  to Supine     Supine to sit: Supervision, HOB elevated Sit to supine: Supervision, HOB elevated        Transfers Overall transfer level: Needs assistance Equipment used: Rolling walker (2 wheels) Transfers: Sit to/from Stand Sit to Stand: Min assist                  Balance Overall balance assessment: Needs assistance Sitting-balance support: Feet supported Sitting balance-Leahy Scale: Good     Standing balance support: Reliant on assistive device for balance Standing balance-Leahy Scale: Fair                             ADL either performed or assessed with clinical judgement   ADL Overall ADL's : Needs assistance/impaired     Grooming: Wash/dry hands;Supervision/safety;Standing           Upper Body Dressing : Minimal assistance;Sitting   Lower Body Dressing: Minimal assistance;Sit to/from stand   Toilet Transfer: Min guard;Rolling walker (2 wheels);Regular Toilet                   Vision Patient Visual Report: No change from baseline       Perception     Praxis      Pertinent Vitals/Pain Pain Assessment Pain Assessment: Faces Faces Pain Scale: Hurts little more Pain Location: Rt back/side with coughing Pain Descriptors / Indicators: Tender, Sore Pain Intervention(s): Monitored during session     Hand Dominance Right   Extremity/Trunk Assessment Upper Extremity Assessment Upper Extremity Assessment: Overall WFL for tasks assessed   Lower Extremity Assessment Lower Extremity Assessment: Defer to PT evaluation   Cervical / Trunk Assessment Cervical / Trunk Assessment: Normal  Communication Communication Communication: No difficulties   Cognition Arousal/Alertness: Awake/alert Behavior During Therapy: Flat affect Overall Cognitive Status: No family/caregiver present to determine baseline cognitive functioning Area of Impairment: Safety/judgement                         Safety/Judgement: Decreased  awareness of safety           General Comments   Desaturates on RA    Exercises     Shoulder Instructions      Home Living Family/patient expects to be discharged to:: Private residence Living Arrangements: Alone Available Help at Discharge: Friend(s) Type of Home: House Home Access: Stairs to enter [employer] of Steps: 3 Entrance Stairs-Rails: Can reach both Home Layout: Two level;Able to live on main level with bedroom/bathroom Alternate Level Stairs-Number of Steps: stays on main level   Bathroom Shower/Tub: Occupational psychologist: Handicapped height Bathroom Accessibility: Yes   Home Equipment: None   Additional Comments: pt lives alone and has 3 dogs, indepndent with mobility. has a close friend who will help her and stay with her as she recovers.      Prior Functioning/Environment Prior Level of Function : Independent/Modified Independent;Driving                        OT Problem List: Decreased activity tolerance;Impaired balance (sitting and/or standing);Decreased safety awareness;Decreased strength      OT Treatment/Interventions: Self-care/ADL training;Therapeutic activities;Patient/family education;Balance training;Energy conservation    OT Goals(Current goals can be found in the care plan section) Acute Rehab OT Goals Patient Stated Goal: Return home OT Goal Formulation: With patient Time For Goal Achievement: 04/10/22 Potential to Achieve Goals: Good ADL Goals Pt Will Perform Grooming: with modified independence;standing Pt Will Perform Lower Body Dressing: with modified independence;sit to/from stand Pt Will Transfer to Toilet: with modified independence;ambulating;regular height toilet  OT Frequency: Min 2X/week    Co-evaluation              AM-PAC OT "6 Clicks" Daily Activity     Outcome Measure Help from another person eating meals?: None Help from another person taking care of personal grooming?: A  Little Help from another person toileting, which includes using toliet, bedpan, or urinal?: A Little Help from another person bathing (including washing, rinsing, drying)?: A Little Help from another person to put on and taking off regular upper body clothing?: A Little Help from another person to put on and taking off regular lower body clothing?: A Little 6 Click Score: 19   End of Session Equipment Utilized During Treatment: Gait belt;Rolling walker (2 wheels);Oxygen Nurse Communication: Mobility status  Activity Tolerance: Patient tolerated treatment well Patient left: in bed;with call bell/phone within reach  OT Visit Diagnosis: Unsteadiness on feet (R26.81)                Time: [id number] OT Time Calculation (min): 21 min Charges:  OT General Charges $OT Visit: 1 Visit OT Evaluation $OT Eval Moderate Complexity: 1 Mod  03/27/2022  RP, OTR/L  Acute Rehabilitation Services  Office:  [phone]   Metta Clines 03/27/2022, 3:32 PM

## 2022-03-27 NOTE — Progress Notes (Addendum)
      [address]       [place] [address]             [phone]      4 Days Post-Op Procedure(s) (LRB): XI ROBOTIC ASSISTED THORACOSCOPY-RIGHT LOWER LOBE LATERAL BASILAR SEGMENTECTOMY (Right) INTERCOSTAL NERVE BLOCK LYMPH NODE BIOPSY Subjective: Patient resting in bed using nebulizer treatment.  Objective: Vital signs in last 24 hours: Temp:  [97.8 F (36.6 C)-98.4 F (36.9 C)] 97.8 F (36.6 C) (03/25 0719) Pulse Rate:  [67-84] 67 (03/25 0719) Cardiac Rhythm: Normal sinus rhythm (03/25 0704) Resp:  [16-18] 17 (03/25 0719) BP: (107-147)/(57-74) 147/74 (03/25 0719) SpO2:  [90 %-97 %] 94 % (03/25 0319)  Hemodynamic parameters for last 24 hours:    Intake/Output from previous day: 03/24 0701 - 03/25 0700 In: 1122.1 [P.O.:1080; I.V.:42.1] Out: 150 [Chest Tube:150] Intake/Output this shift: Total I/O In: 240 [P.O.:240] Out: -   General appearance: alert, cooperative, and no distress Neurologic: intact Heart: regular rate and rhythm, S1, S2 normal, no murmur, click, rub or gallop Lungs: Diminished bibasilar Abdomen: soft, non-tender; bowel sounds normal; no masses,  no organomegaly Extremities: extremities normal, atraumatic, no cyanosis or edema Wound: Clean and dry dressing in place  Lab Results: Recent Labs    03/25/22 0012  WBC 4.4  HGB 9.6*  HCT 28.5*  PLT 48*   BMET:  Recent Labs    03/25/22 0012 03/27/22 0026  NA 129* 129*  K 4.2 4.9  CL 102 100  CO2 22 22  GLUCOSE 118* 54*  BUN 17 23  CREATININE 0.89 0.98  CALCIUM 7.7* 8.5*    PT/INR: No results for input(s): "LABPROT", "INR" in the last 72 hours. ABG    Component Value Date/Time   PHART 7.4 03/21/2022 1332   HCO3 20.4 03/21/2022 1332   ACIDBASEDEF 3.6 (H) 03/21/2022 1332   O2SAT 98.5 03/21/2022 1332   CBG (last 3)  Recent Labs    03/27/22 0312 03/27/22 0409 03/27/22 0621  GLUCAP 49* 231* 242*    Assessment/Plan: S/P Procedure(s) (LRB): XI ROBOTIC ASSISTED  THORACOSCOPY-RIGHT LOWER LOBE LATERAL BASILAR SEGMENTECTOMY (Right) INTERCOSTAL NERVE BLOCK LYMPH NODE BIOPSY  Neuro: Pain ok control. Pt on Flexeril, gabapentin and Norco at home.    CV: NSR. HR 60s, SBP 112-147. Home Nadolol on hold for now.   Pulm: CT to water seal. CT output 150cc/24hrs. No air leak seen. CXR without definitive right apical pneumothorax. Acute pulmonary edema due to postoperative state, IV fluids and poor cough effort stable to improved with Lasix. CXR with hazy opacity at right lung base, atelectasis vs infection. Continue to monitor. Saturating 95% on 2L [place]. Encouraged IS, flutter valve and ambulation. Continue nebulizer treatments.   GI: +BM, tolerating appetite. Mild protein-calorie malnutrition with Albumin 2.7. 5 lb weight loss over 3 months. Pt diagnosed with squamous cell carcinoma of the lung. Encourage healthy nutrition.    Endo: Hx of uncontrolled DM, Preop A1C 9.1. CBGs not well controlled 49/231/242. Will again decrease Levemir due to hypoglycemia.    Renal: Cr 0.98. UO not recorded.   Hyponatremia: Improving. Na 129, monitor.   Expected postop ABLA: Stable H/H 9.6/28.5   Chronic thrombocytopenia: Last Plt 48,000 this AM. About 68,000 at baseline. Holding Lovenox.   DVT Prophylaxis: SCDs and ambulation  Pathology: Pending, squamous cell carcinoma   Dispo: Plan to D/C chest tube today.   LOS: 4 days    Vanessa River, PA-C 03/27/2022

## 2022-03-27 NOTE — Anesthesia Postprocedure Evaluation (Signed)
Anesthesia Post Note  Patient: Vanessa Oneill  Procedure(s) Performed: XI ROBOTIC ASSISTED THORACOSCOPY-RIGHT LOWER LOBE LATERAL BASILAR SEGMENTECTOMY (Right: Chest) INTERCOSTAL NERVE BLOCK (Chest) LYMPH NODE BIOPSY (Chest)     Patient location during evaluation: PACU Anesthesia Type: General Level of consciousness: awake and patient cooperative Pain management: pain level controlled Vital Signs Assessment: post-procedure vital signs reviewed and stable Respiratory status: spontaneous breathing, nonlabored ventilation, respiratory function stable and patient connected to nasal cannula oxygen Cardiovascular status: blood pressure returned to baseline and stable Postop Assessment: no apparent nausea or vomiting Anesthetic complications: no   No notable events documented.  Last Vitals:  Vitals:   03/27/22 1047 03/27/22 1612  BP: (!) 142/62 126/71  Pulse: 70 79  Resp: 17 16  Temp: 36.9 C 36.8 C  SpO2:  96%    Last Pain:  Vitals:   03/27/22 1047  TempSrc: Oral  PainSc:                  [name]

## 2022-03-27 NOTE — Progress Notes (Signed)
Patient c/o feeling bad and diaphoretic.  CBG 44.  Pt given tube of glucose, coke and peanut butter crackers.  Rechecked glucose at 0312, glucose 49.  1 amp D50 given.

## 2022-03-27 NOTE — Progress Notes (Signed)
SATURATION QUALIFICATIONS: (This note is used to comply with regulatory documentation for home oxygen)  Patient Saturations on Room Air at Rest = 89-90%  Patient Saturations on Room Air while Ambulating = 85-87%  Patient Saturations on 2 Liters of oxygen while Ambulating = 95%  Please briefly explain why patient needs home oxygen: Patient unable to maintain O2 saturations without supplemental O2  03/27/2022  RP, OTR/L  Acute Rehabilitation Services  Office:  [phone]

## 2022-03-27 NOTE — Care Management Important Message (Signed)
Important Message  Patient Details  Name: Vanessa Oneill MRN: [id number] Date of Birth: 1950-02-14   Medicare Important Message Given:  Yes     Orbie Pyo 03/27/2022, 2:24 PM

## 2022-03-27 NOTE — Evaluation (Signed)
Physical Therapy Evaluation Patient Details Name: Vanessa Oneill MRN: [id number] DOB: 1950/09/06 Today's Date: 03/27/2022  History of Present Illness  Patient is 72 y.o. female diagnoses with RLL Lung nodule now s/p robotic-assisted right VATS lateral basilar segmentectomy of right lower lobe on 03/23/22. PMH significant for anxiety, OA, COPD, Depression, DM, HLD, HTN, lumbago, non-alcoholic-cirrhosis, Raynaud's.   Clinical Impression  Vanessa Oneill is 72 y.o. female admitted with above HPI and diagnosis. Patient is currently limited by functional impairments below (see PT problem list). Patient lives alone and is independent at baseline. Currently pt requires min assist for bed mobility, min-mod assist for transfers dependent on surface height, and min assist for gait with RW. Patient will benefit from continued skilled PT interventions to address impairments and progress independence with mobility, recommending HHPT. Acute PT will follow and progress as able.        Recommendations for follow up therapy are one component of a multi-disciplinary discharge planning process, led by the attending physician.  Recommendations may be updated based on patient status, additional functional criteria and insurance authorization.  Follow Up Recommendations       Assistance Recommended at Discharge Frequent or constant Supervision/Assistance  Patient can return home with the following  A little help with walking and/or transfers;A little help with bathing/dressing/bathroom;Assistance with cooking/housework;Direct supervision/assist for medications management;Assist for transportation;Help with stairs or ramp for entrance    Equipment Recommendations Rolling walker (2 wheels);BSC/3in1 (TBD)  Recommendations for Other Services       Functional Status Assessment Patient has had a recent decline in their functional status and demonstrates the ability to make significant improvements in function  in a reasonable and predictable amount of time.     Precautions / Restrictions Precautions Precautions: Fall Precaution Comments: CT Restrictions Weight Bearing Restrictions: No      Mobility  Bed Mobility Overal bed mobility: Needs Assistance Bed Mobility: Supine to Sit, Sit to Supine, Rolling Rolling: Min guard   Supine to sit: Min assist, HOB elevated Sit to supine: Min assist, HOB elevated   General bed mobility comments: cues for use of bed rail to raise trunk, pt able to bring LE's off EOB but assist needed to return to supine. Min assist for raising/lowering trunk.    Transfers Overall transfer level: Needs assistance Equipment used: Rolling walker (2 wheels) Transfers: Sit to/from Stand Sit to Stand: Min assist, Mod assist           General transfer comment: min assist from EOB wtih cues for hand placement. mod assist to power up from low toilet height with cues for use of grab bars.    Ambulation/Gait Ambulation/Gait assistance: Min assist Gait Distance (Feet): 70 Feet Assistive device: Rolling walker (2 wheels) Gait Pattern/deviations: Step-through pattern, Decreased step length - right, Decreased step length - left, Decreased stride length, Trunk flexed, Drifts right/left Gait velocity: decr     General Gait Details: no buckling noted and pt overall steady with cautious pace. Pt drifting Rt>Lt and bumping into walls/doorframes. Cues needed to recognize obnstacles and min assist to avoid for safety.  Stairs            Wheelchair Mobility    Modified Rankin (Stroke Patients Only)       Balance Overall balance assessment: Mild deficits observed, not formally tested, Needs assistance Sitting-balance support: Feet supported Sitting balance-Leahy Scale: Good Sitting balance - Comments: able to complete pericare seated on toilet   Standing balance support: Bilateral upper extremity supported, During functional  activity, Reliant on assistive device  for balance Standing balance-Leahy Scale: Poor Standing balance comment: reliant on RW                             Pertinent Vitals/Pain Pain Assessment Pain Assessment: Faces Faces Pain Scale: Hurts little more Pain Location: Rt back/side, CT site especially with coughing and transitional movements Pain Descriptors / Indicators: Aching, Discomfort, Guarding, Grimacing Pain Intervention(s): Limited activity within patient's tolerance, Monitored during session, Repositioned    Home Living Family/patient expects to be discharged to:: Private residence Living Arrangements: Alone Available Help at Discharge: Friend(s) Type of Home: House Home Access: Stairs to enter Entrance Stairs-Rails: Can reach both Entrance Stairs-Number of Steps: 3 Alternate Level Stairs-Number of Steps: stays on main level Home Layout: One level;Two level   Additional Comments: pt lives alone and has 3 dogs, indepndent with mobility. has a close friend who will help her and stay with her as she recovers.    Prior Function Prior Level of Function : Independent/Modified Independent;Driving                     Hand Dominance   Dominant Hand: Right    Extremity/Trunk Assessment   Upper Extremity Assessment Upper Extremity Assessment: Overall WFL for tasks assessed    Lower Extremity Assessment Lower Extremity Assessment: Generalized weakness    Cervical / Trunk Assessment Cervical / Trunk Assessment: Kyphotic  Communication   Communication: No difficulties  Cognition Arousal/Alertness: Awake/alert Behavior During Therapy: WFL for tasks assessed/performed Overall Cognitive Status: Within Functional Limits for tasks assessed                                          General Comments      Exercises     Assessment/Plan    PT Assessment Patient needs continued PT services  PT Problem List Decreased strength;Decreased activity tolerance;Decreased  balance;Decreased mobility;Decreased knowledge of use of DME;Decreased safety awareness;Decreased knowledge of precautions;Cardiopulmonary status limiting activity       PT Treatment Interventions DME instruction;Gait training;Stair training;Functional mobility training;Therapeutic activities;Therapeutic exercise;Balance training;Patient/family education    PT Goals (Current goals can be found in the Care Plan section)  Acute Rehab PT Goals Patient Stated Goal: get home PT Goal Formulation: With patient Time For Goal Achievement: 04/10/22 Potential to Achieve Goals: Good    Frequency Min 3X/week     Co-evaluation               AM-PAC PT "6 Clicks" Mobility  Outcome Measure Help needed turning from your back to your side while in a flat bed without using bedrails?: A Little Help needed moving from lying on your back to sitting on the side of a flat bed without using bedrails?: A Little Help needed moving to and from a bed to a chair (including a wheelchair)?: A Little Help needed standing up from a chair using your arms (e.g., wheelchair or bedside chair)?: A Lot Help needed to walk in hospital room?: A Little Help needed climbing 3-5 steps with a railing? : A Lot 6 Click Score: 16    End of Session Equipment Utilized During Treatment: Gait belt;Oxygen Activity Tolerance: Patient tolerated treatment well Patient left: in bed;with call bell/phone within reach;with bed alarm set Nurse Communication: Mobility status PT Visit Diagnosis: Muscle weakness (generalized) (M62.81);Difficulty in walking,  not elsewhere classified (R26.2);Other abnormalities of gait and mobility (R26.89);Unsteadiness on feet (R26.81)    Time: [id number] PT Time Calculation (min) (ACUTE ONLY): 28 min   Charges:   PT Evaluation $PT Eval Moderate Complexity: 1 Mod PT Treatments $Gait Training: 8-22 mins        Verner Mould, DPT Acute Rehabilitation Services Office [phone]  03/27/22 10:13  AM

## 2022-03-28 ENCOUNTER — Inpatient Hospital Stay (HOSPITAL_COMMUNITY): Payer: Medicare Other

## 2022-03-28 LAB — CBC
HCT: 32.7 % — ABNORMAL LOW (ref 36.0–46.0)
Hemoglobin: 11.1 g/dL — ABNORMAL LOW (ref 12.0–15.0)
MCH: 31 pg (ref 26.0–34.0)
MCHC: 33.9 g/dL (ref 30.0–36.0)
MCV: 91.3 fL (ref 80.0–100.0)
Platelets: 81 10*3/uL — ABNORMAL LOW (ref 150–400)
RBC: 3.58 MIL/uL — ABNORMAL LOW (ref 3.87–5.11)
RDW: 14.8 % (ref 11.5–15.5)
WBC: 5.1 10*3/uL (ref 4.0–10.5)
nRBC: 0 % (ref 0.0–0.2)

## 2022-03-28 LAB — SURGICAL PATHOLOGY

## 2022-03-28 LAB — GLUCOSE, CAPILLARY
Glucose-Capillary: 77 mg/dL (ref 70–99)
Glucose-Capillary: 273 mg/dL — ABNORMAL HIGH (ref 70–99)

## 2022-03-28 MED ORDER — GABAPENTIN 100 MG PO CAPS: 100.0000 mg | ORAL_CAPSULE | ORAL | 1 refills | Status: DC

## 2022-03-28 MED ORDER — INSULIN DETEMIR 100 UNIT/ML ~~LOC~~ SOLN
15.0000 [IU] | Freq: Two times a day (BID) | SUBCUTANEOUS | Status: DC
  Administered 2022-03-28: 15 [IU] via SUBCUTANEOUS
  Filled 2022-03-28 (×2): qty 0.15

## 2022-03-28 NOTE — Progress Notes (Addendum)
[address]       [place] [address]             [phone]      5 Days Post-Op Procedure(s) (LRB): XI ROBOTIC ASSISTED THORACOSCOPY-RIGHT LOWER LOBE LATERAL BASILAR SEGMENTECTOMY (Right) INTERCOSTAL NERVE BLOCK LYMPH NODE BIOPSY Subjective: Patient tearful this AM stating she wants to go home.  Objective: Vital signs in last 24 hours: Temp:  [98 F (36.7 C)-98.9 F (37.2 C)] 98 F (36.7 C) (03/26 0732) Pulse Rate:  [70-80] 79 (03/26 0732) Cardiac Rhythm: Normal sinus rhythm (03/26 0708) Resp:  [14-18] 18 (03/26 0732) BP: (110-142)/(55-77) 110/55 (03/26 0732) SpO2:  [90 %-96 %] 93 % (03/26 0732)  Hemodynamic parameters for last 24 hours:    Intake/Output from previous day: 03/25 0701 - 03/26 0700 In: 300 [P.O.:300] Out: 50 [Chest Tube:50] Intake/Output this shift: No intake/output data recorded.  General appearance: alert and cooperative Neurologic: intact Heart: regular rate and rhythm, S1, S2 normal, no murmur, click, rub or gallop Lungs: Some wheezes, diminished bibasilar Abdomen: soft, non-tender; bowel sounds normal; no masses,  no organomegaly Extremities: edema trace, hx of LE swelling Wound: Some serosanguinous drainage from old chest tube site, no erythema or purulent drainage indicating infection   Lab Results: Recent Labs    03/28/22 0026  WBC 5.1  HGB 11.1*  HCT 32.7*  PLT 81*   BMET:  Recent Labs    03/27/22 0026  NA 129*  K 4.9  CL 100  CO2 22  GLUCOSE 54*  BUN 23  CREATININE 0.98  CALCIUM 8.5*    PT/INR: No results for input(s): "LABPROT", "INR" in the last 72 hours. ABG    Component Value Date/Time   PHART 7.4 03/21/2022 1332   HCO3 20.4 03/21/2022 1332   ACIDBASEDEF 3.6 (H) 03/21/2022 1332   O2SAT 98.5 03/21/2022 1332   CBG (last 3)  Recent Labs    03/27/22 1614 03/27/22 2126 03/28/22 0529  GLUCAP 78 179* 77    Assessment/Plan: S/P Procedure(s) (LRB): XI ROBOTIC ASSISTED THORACOSCOPY-RIGHT  LOWER LOBE LATERAL BASILAR SEGMENTECTOMY (Right) INTERCOSTAL NERVE BLOCK LYMPH NODE BIOPSY  Neuro: Pain ok control. Pt on Flexeril, gabapentin and Norco at home, will restart home meds at discharge.   CV: NSR. HR 70s, SBP 110. Home Nadolol on hold for now.   Pulm: CT removed yesterday. Stable right apical pneumothorax. Basilar opacities, likely atelectasis. Saturating 93-96% on 2L [place].  Interstitial disease stable to improved...volume overload? Was given Lasix. Cough has improved. Encouraged IS, flutter valve and ambulation. Continue nebulizer treatments. Qualified for home O2 yesterday. Saturating 96% on RA this AM, will do another walk test today.    GI: +BM, tolerating appetite. Protein-calorie malnutrition, Albumin 2.7. Likely due to inadequate nutritional intake. Encouraged healthy nutrition.    Endo: Updated A1C 7.1. CBGs 78/179/77. Continue to decrease Levemir. Will restart home meds at discharge.    Renal: Last Cr 0.98. UO not recorded. Pt states she is voiding well independently.    Hyponatremia/pseudohyponatremia: Due to hyperglycemia vs volume overload? Improving. Na 129.   Expected postop ABLA: Stable H/H 11.1/32.7   Chronic thrombocytopenia: Plt 81,000 this AM. About 68,000 at baseline. Holding Lovenox.   DVT Prophylaxis: SCDs and ambulation  ID: No leukocytosis, afebrile  Deconditioning: PT/OT recommended home health. Pt was agreeable yesterday but today feels she does not need it. Social worker to discuss with her before discharge.   Dispo: 6 minute walk to reassess oxygen needs. D/C  to home today?   LOS: 5 days    Magdalene River, PA-C 03/28/2022   Patient examined and today's chest x-ray personally reviewed. Patient did well ambulating in hallway with physical therapy. Pulmonary status stable on 2 L nasal oxygen Patient appears stable for DC home with rolling walker and home O2. Discharge instructions reviewed with patient.  patient examined and medical  record reviewed,agree with above note. Dahlia Byes 03/28/2022

## 2022-03-28 NOTE — Progress Notes (Signed)
Went over discharge paper work with patient. All questions answered. PIV and telemetry removed. All belongings at bedside.  

## 2022-03-28 NOTE — Progress Notes (Signed)
   Durable Medical Equipment (From admission, onward)   Confided to one room.     Start     Ordered  03/28/22 0816  For home use only DME 3 n 1  Once       03/28/22 0816  03/27/22 1200  For home use only DME oxygen  Once      Question Answer Comment Length of Need 6 Months  Mode or (Route) Nasal cannula  Liters per Minute 2  Oxygen delivery system Gas    03/27/22 1200  03/27/22 1040  For home use only DME Walker rolling  Once      Question Answer Comment Walker: With [place]  Patient needs a walker to treat with the following condition Imbalance    03/27/22 1042

## 2022-03-28 NOTE — Progress Notes (Signed)
Nurse requested Mobility Specialist to perform oxygen saturation test with pt which includes removing pt from oxygen both at rest and while ambulating.  Below are the results from that testing.     Patient Saturations on Room Air at Rest = spO2 95%  Patient Saturations on Room Air while Ambulating = sp02 91% .  Reported results to nurse.

## 2022-03-28 NOTE — Progress Notes (Signed)
Physical Therapy Treatment Patient Details Name: Westley Lefler MRN: [id number] DOB: 04-22-1950 Today's Date: 03/28/2022   History of Present Illness Patient is 72 y.o. female diagnoses with RLL Lung nodule now s/p robotic-assisted right VATS lateral basilar segmentectomy of right lower lobe on 03/23/22. PMH significant for anxiety, OA, COPD, Depression, DM, HLD, HTN, lumbago, non-alcoholic-cirrhosis, Raynaud's.    PT Comments    Patient making progress with mobility and expresses eagerness to go home and agreeable to therapy despite fatigue and discomfort. Pt completed bed mobility at supervision level with use of bed features and extra time. Pt required cues for hand placement with power up to RW and close min guard with cues for safety during gait. Pt completed 6MWT this session, SpO2 ranging from 87%-93% with poor read on pleth on finger with gait. Pleth changed to ear lobe with improved reading of 96-97% on RA at rest. Pt is making steady progress overall, will continue to progress as able and encouraged to continue mobilizing during acute stay.   6 Min Walk Test:  Pre test: 143/69 mmHg 91 bpm 90% on RA Post test: 137/66 mmHg 103 bpm 90% on RA (Throughout test HR elevated to 110's, SPO2 with poor pleuth and low of 84% with high of 93% on RA during gait)  Instructed patient to ambulate as quickly and as safely as possible for 6 minutes using LRAD. Patient was allowed to take standing rest breaks without stopping the test, but if the patient required a sitting rest break the clock would be stopped and the test would be over.  Results: 236 feet (71 meters, Avg speed 1.2 m/s) using a RW with CGA/supervision. Results indicate that the patient has reduced endurance with ambulation compared to age matched norms.  Age Matched Norms: 45-69 yo M: 50 F: 27, 31-79 yo M: 30 F: 471, 68-89 yo M: 417 F: 392 MDC: 58.21 meters (190.98 feet) or 50 meters (ANPTA Core Set of Outcome Measures for Adults  with Neurologic Conditions, 2018)     Recommendations for follow up therapy are one component of a multi-disciplinary discharge planning process, led by the attending physician.  Recommendations may be updated based on patient status, additional functional criteria and insurance authorization.  Follow Up Recommendations       Assistance Recommended at Discharge Frequent or constant Supervision/Assistance  Patient can return home with the following A little help with walking and/or transfers;A little help with bathing/dressing/bathroom;Assistance with cooking/housework;Direct supervision/assist for medications management;Assist for transportation;Help with stairs or ramp for entrance   Equipment Recommendations  Rolling walker (2 wheels);BSC/3in1 (TBD)    Recommendations for Other Services       Precautions / Restrictions Precautions Precautions: Fall Precaution Comments: CT Restrictions Weight Bearing Restrictions: No     Mobility  Bed Mobility Overal bed mobility: Needs Assistance Bed Mobility: Supine to Sit, Sit to Supine, Rolling Rolling: Supervision   Supine to sit: HOB elevated, Supervision Sit to supine: HOB elevated, Supervision   General bed mobility comments: pt slow to mobilize, use of bed features, supervision for line management.    Transfers Overall transfer level: Needs assistance Equipment used: Rolling walker (2 wheels) Transfers: Sit to/from Stand Sit to Stand: Min guard, Supervision           General transfer comment: min guard/close supervision from EOB with cues for hand placement.    Ambulation/Gait Ambulation/Gait assistance: Min guard, Supervision Gait Distance (Feet): 236 Feet (6MWT) Assistive device: Rolling walker (2 wheels) Gait Pattern/deviations: Step-through pattern, Decreased step  length - right, Decreased step length - left, Decreased stride length, Trunk flexed, Drifts right/left Gait velocity: decr     General Gait Details:  pt slow with pace and cues intermittently to avoid obstacles in hallway and doorframe with turn as pt tends to veer close to walls. no overt LOB. SpO2 with poor pleuth. O2 reading 90% on RA intermittently with 1x drop to 87% but poor read.   Stairs             Wheelchair Mobility    Modified Rankin (Stroke Patients Only)       Balance Overall balance assessment: Mild deficits observed, not formally tested, Needs assistance Sitting-balance support: Feet supported Sitting balance-Leahy Scale: Good Sitting balance - Comments: able to complete pericare seated on toilet   Standing balance support: Bilateral upper extremity supported, During functional activity, Reliant on assistive device for balance Standing balance-Leahy Scale: Fair                              Cognition Arousal/Alertness: Awake/alert Behavior During Therapy: WFL for tasks assessed/performed Overall Cognitive Status: Within Functional Limits for tasks assessed                                          Exercises      General Comments        Pertinent Vitals/Pain Pain Assessment Pain Assessment: Faces Faces Pain Scale: Hurts little more Pain Location: Rt back/side, CT site especially with coughing and transitional movements Pain Descriptors / Indicators: Aching, Discomfort, Guarding, Grimacing Pain Intervention(s): Limited activity within patient's tolerance, Monitored during session, Repositioned    Home Living                          Prior Function            PT Goals (current goals can now be found in the care plan section) Acute Rehab PT Goals Patient Stated Goal: get home PT Goal Formulation: With patient Time For Goal Achievement: 04/10/22 Potential to Achieve Goals: Good Progress towards PT goals: Progressing toward goals    Frequency    Min 3X/week      PT Plan Current plan remains appropriate    Co-evaluation               AM-PAC PT "6 Clicks" Mobility   Outcome Measure  Help needed turning from your back to your side while in a flat bed without using bedrails?: A Little Help needed moving from lying on your back to sitting on the side of a flat bed without using bedrails?: A Little Help needed moving to and from a bed to a chair (including a wheelchair)?: A Little Help needed standing up from a chair using your arms (e.g., wheelchair or bedside chair)?: A Lot Help needed to walk in hospital room?: A Little Help needed climbing 3-5 steps with a railing? : A Little 6 Click Score: 17    End of Session Equipment Utilized During Treatment: Gait belt;Oxygen Activity Tolerance: Patient tolerated treatment well Patient left: in bed;with call bell/phone within reach;with bed alarm set Nurse Communication: Mobility status PT Visit Diagnosis: Muscle weakness (generalized) (M62.81);Difficulty in walking, not elsewhere classified (R26.2);Other abnormalities of gait and mobility (R26.89);Unsteadiness on feet (R26.81)     Time: [id number] PT Time Calculation (  min) (ACUTE ONLY): 38 min  Charges:  $Gait Training: 8-22 mins $Therapeutic Activity: 23-37 mins                       Verner Mould, DPT Acute Rehabilitation Services Office [phone]  03/28/22 12:29 PM

## 2022-03-28 NOTE — TOC Initial Note (Signed)
Transition of Care [facility]) - Initial/Assessment Note    Patient Details  Name: Vanessa Oneill MRN: [id number] Date of Birth: 07-15-1950  Transition of [facility]) CM/SW Contact:    Cyndi Bender, RN Phone Number: 03/28/2022, 9:49 AM  Clinical Narrative:                 Spoke to patient regarding home health and DME. Patient declines home health at this time but is agreeable to oxygen, walker and BSC. Patient agreeable to use in house provider [facility] for dme needs.  Kristin with [facility] notified of DME orders. Patient's friend can transport home.   Expected Discharge Plan: [place]     Patient Goals and CMS Choice Patient states their goals for this hospitalization and ongoing recovery are:: return home          Expected Discharge Plan and Services       Living arrangements for the past 2 months: Single Family Home Expected Discharge Date: 03/28/22               DME Arranged: Gilford Rile rolling, Bedside commode, Oxygen DME Agency: [facility] Date DME Agency Contacted: 03/28/22 Time DME Agency Contacted: [phone] Representative spoke with at DME Agency: Erasmo Downer            Prior Living Arrangements/Services Living arrangements for the past 2 months: Single Family Home Lives with:: Self   Do you feel safe going back to the place where you live?: Yes      Need for Family Participation in Patient Care: Yes (Comment) Care giver support system in place?: Yes (comment)      Activities of Daily Living Home Assistive Devices/Equipment: CBG Meter, Blood pressure cuff, Dentures (specify type), Walker (specify type) (upper) ADL Screening (condition at time of admission) Patient's cognitive ability adequate to safely complete daily activities?: Yes Is the patient deaf or have difficulty hearing?: No Does the patient have difficulty seeing, even when wearing glasses/contacts?: No Does the patient have difficulty concentrating, remembering, or making  decisions?: No Patient able to express need for assistance with ADLs?: Yes Does the patient have difficulty dressing or bathing?: No Independently performs ADLs?: Yes (appropriate for developmental age) Does the patient have difficulty walking or climbing stairs?: No Weakness of Legs: None Weakness of Arms/Hands: None  Permission Sought/Granted                  Emotional Assessment   Attitude/Demeanor/Rapport: Gracious Affect (typically observed): Accepting Orientation: : Oriented to Situation, Oriented to Place, Oriented to Self, Oriented to  Time Alcohol / Substance Use: Not Applicable Psych Involvement: No (comment)  Admission diagnosis:  Right lower lobe pulmonary nodule [R91.1] Patient Active Problem List   Diagnosis Date Noted   Right lower lobe pulmonary nodule 03/23/2022   HTN (hypertension) 08/10/2017   HLD (hyperlipidemia) 08/10/2017   Diabetes ([place]) 08/10/2017   COPD (chronic obstructive pulmonary disease) ([place]) XX123456   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[place]) XX123456   UTI (urinary tract infection) 02/06/2017   Fever 02/06/2017   Septic shock ([place]) 10/18/2016   Pyelonephritis    Respiratory distress    Sepsis ([place]) 10/17/2016   GIB (gastrointestinal bleeding) 07/22/2016   Personal history of tobacco use, presenting hazards to health 11/12/2014   PCP:  Adin Hector, MD Pharmacy:   [id number] - [place], [place] - [address] [place] [address] Phone: [phone] Fax: [place] ([place]), [place] -  [place] ([place] [address] Phone: [phone] Fax: [phone]     Social Determinants of Health (SDOH) Social History: SDOH Screenings   Food Insecurity: No Food Insecurity (03/24/2022)  Housing: Low Risk  (03/24/2022)  Transportation Needs: No Transportation Needs (03/24/2022)  Utilities: Not At Risk (03/24/2022)  Financial Resource Strain: Low  Risk  (02/06/2017)  Physical Activity: Inactive (02/06/2017)  Social Connections: Moderately Integrated (02/06/2017)  Stress: Stress Concern Present (02/06/2017)  Tobacco Use: High Risk (03/24/2022)   SDOH Interventions:     Readmission Risk Interventions     No data to display

## 2022-03-28 NOTE — Progress Notes (Signed)
Last night BS is 179. Pt refused to have snack.  Insulin coverage was not given. This AM, pt complaining weakness and blood sugar checked- 77. Gave snack. Continue monitoring.

## 2022-03-28 NOTE — TOC Transition Note (Signed)
Transition of Care [facility]) - CM/SW Discharge Note   Patient Details  Name: Vanessa Oneill MRN: [id number] Date of Birth: 01-10-1950  Transition of [facility]) CM/SW Contact:  Cyndi Bender, RN Phone Number: 03/28/2022, 12:19 PM   Clinical Narrative:      Patient now no longer wants home health or DME. Erasmo Downer with [facility] is aware. Final next level of care: Home/Self Care     Patient Goals and CMS Choice      Discharge Placement                         Discharge Plan and Services Additional resources added to the After Visit Summary for                  DME Arranged: Walker rolling, Bedside commode, Oxygen DME Agency: [facility] Date DME Agency Contacted: 03/28/22 Time DME Agency Contacted: [phone] Representative spoke with at DME Agency: [place] Determinants of Health ([place]) Interventions SDOH Screenings   Food Insecurity: No Food Insecurity (03/24/2022)  Housing: Low Risk  (03/24/2022)  Transportation Needs: No Transportation Needs (03/24/2022)  Utilities: Not At Risk (03/24/2022)  Financial Resource Strain: Low Risk  (02/06/2017)  Physical Activity: Inactive (02/06/2017)  Social Connections: Moderately Integrated (02/06/2017)  Stress: Stress Concern Present (02/06/2017)  Tobacco Use: High Risk (03/24/2022)     Readmission Risk Interventions    03/28/2022   12:19 PM  Readmission Risk Prevention Plan  Post Dischage Appt Complete  Medication Screening Complete  Transportation Screening Complete

## 2022-03-28 NOTE — Progress Notes (Signed)
Mobility Specialist Progress Note:   03/28/22 1000  Mobility  Activity Ambulated with assistance in hallway  Level of Assistance Standby assist, set-up cues, supervision of patient - no hands on  Assistive Device None  Distance Ambulated (ft) 120 ft  Activity Response Tolerated well  $Mobility charge 1 Mobility   Pt in bed willing to participate in mobility. No complaints of pain. Left in bed with call bell in reach and all needs met.   Gareth Eagle Milik Gilreath Mobility Specialist Please contact via [employer] or  Rehab Office at [phone]

## 2022-04-03 ENCOUNTER — Other Ambulatory Visit: Payer: Self-pay | Admitting: Thoracic Surgery (Cardiothoracic Vascular Surgery)

## 2022-04-03 DIAGNOSIS — R911 Solitary pulmonary nodule: Secondary | ICD-10-CM

## 2022-04-04 ENCOUNTER — Ambulatory Visit
Admission: RE | Admit: 2022-04-04 | Discharge: 2022-04-04 | Disposition: A | Discharge status: Home / Self Care | Payer: Medicare Other | Source: Ambulatory Visit | Attending: Thoracic Surgery (Cardiothoracic Vascular Surgery) | Admitting: Thoracic Surgery (Cardiothoracic Vascular Surgery)

## 2022-04-04 ENCOUNTER — Encounter: Payer: Self-pay | Admitting: Thoracic Surgery (Cardiothoracic Vascular Surgery)

## 2022-04-04 ENCOUNTER — Ambulatory Visit (INDEPENDENT_AMBULATORY_CARE_PROVIDER_SITE_OTHER): Payer: Self-pay | Admitting: Thoracic Surgery (Cardiothoracic Vascular Surgery)

## 2022-04-04 VITALS — BP 126/68 | HR 78 | Resp 20 | Ht 67.0 in | Wt 171.0 lb

## 2022-04-04 DIAGNOSIS — C3431 Malignant neoplasm of lower lobe, right bronchus or lung: Secondary | ICD-10-CM | POA: Insufficient documentation

## 2022-04-04 DIAGNOSIS — Z09 Encounter for follow-up examination after completed treatment for conditions other than malignant neoplasm: Secondary | ICD-10-CM

## 2022-04-04 DIAGNOSIS — R911 Solitary pulmonary nodule: Secondary | ICD-10-CM

## 2022-04-04 NOTE — Progress Notes (Signed)
Vanessa Oneill [address]       [place] [address]             [phone]     HPI: Mrs. Vanessa Oneill returns for a scheduled follow-up after recent right lower lobe segmentectomy.  Vanessa Oneill is a 72 year old woman with a past medical history significant for tobacco abuse, COPD, hypertension, hyperlipidemia, GI bleeding, pyelonephritis, sepsis, and diabetes.  She had bilateral findings on low-dose CT for lung cancer screening in July 2023.  There was a cystic lesion in the left lower lobe and a small nodule in the right lower lobe.  Over time the right lower lobe nodule enlarged and was hypermetabolic on PET/CT.  The cystic lesion in the left lower lobe remained unchanged although it was also hypermetabolic.  She underwent a right lower lobe basilar segmentectomy on 03/23/2022.  She went home on postoperative day 5.  She did go home with home oxygen.  Pathology showed a T1b, N0, stage Ia squamous cell carcinoma.  She is no longer using oxygen.  She is having significant incisional pain.  She was on a 500 mg a day dose of gabapentin split up into 3 separate doses.  She has not been taking it according to that schedule.  Has used hydrocodone a couple of times but mostly just using Tylenol for pain.  Complains of pain in the right upper quadrant and along the costal margin.  No respiratory issues.  She has not smoked since discharge.  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 04/04/2022   Right lower lobe pulmonary nodule 03/23/2022   HTN (hypertension) 08/10/2017   HLD (hyperlipidemia) 08/10/2017   Diabetes 08/10/2017   COPD (chronic obstructive pulmonary disease) XX123456   Non-alcoholic cirrhosis XX123456   UTI (urinary tract infection) 02/06/2017   Fever 02/06/2017   Septic shock 10/18/2016   Pyelonephritis    Respiratory distress    Sepsis 10/17/2016   GIB (gastrointestinal bleeding) 07/22/2016   Personal  history of tobacco use, presenting hazards to health 11/12/2014    Current Outpatient Medications  Medication Sig Dispense Refill   albuterol (VENTOLIN HFA) 108 (90 Base) MCG/ACT inhaler Inhale 1-2 puffs into the lungs every 6 (six) hours as needed for wheezing or shortness of breath.     ALPRAZolam (XANAX) 0.25 MG tablet Take 0.25 mg by mouth daily as needed for anxiety or sleep.  5   busPIRone (BUSPAR) 7.5 MG tablet Take 7.5 mg by mouth 2 (two) times daily.     colestipol (COLESTID) 1 g tablet Take 1 g by mouth 2 (two) times daily.     cyclobenzaprine (FLEXERIL) 10 MG tablet Take 10 mg by mouth 3 (three) times daily as needed for muscle spasms.     gabapentin (NEURONTIN) 100 MG capsule Take 1 capsule (100 mg total) by mouth See admin instructions. Take 100 mg by mouth in the morning, 300 mg in the afternoon and 100 mg at bedtime. 60 capsule 1   glimepiride (AMARYL) 4 MG tablet Take 4 mg by mouth daily with breakfast.     HYDROcodone-acetaminophen (NORCO/VICODIN) 5-325 MG tablet Take 1 tablet by mouth every 8 (eight) hours as needed for moderate pain.      insulin NPH Human (HUMULIN N,NOVOLIN N) 100 UNIT/ML injection Inject 0.1 mLs (10 Units total) into the skin 2 (two) times daily before a meal. Use 30 units in the morning, 20 units  during the day and 30 units at night. (Patient taking differently: Inject 30 Units into the skin daily as needed (blood sugar of 160 or above).) 10 mL 11   ipratropium (ATROVENT) 0.06 % nasal spray Place 2 sprays into both nostrils in the morning and at bedtime.     metFORMIN (GLUCOPHAGE-XR) 500 MG 24 hr tablet Take 1,000 mg by mouth daily with supper.     NONFORMULARY OR COMPOUNDED ITEM Apply 1 Pump topically 3 (three) times daily as needed (neuropathy in feet). Baclofen 2%, Diclofenac 5%, Gabapentin 6%, Tetracaine 3%     omeprazole (PRILOSEC) 40 MG capsule Take 40 mg by mouth daily.     spironolactone (ALDACTONE) 25 MG tablet Take 25 mg by mouth daily.      traZODone (DESYREL) 50 MG tablet Take 50 mg by mouth at bedtime.  0   No current facility-administered medications for this visit.    Physical Exam BP 126/68   Pulse 78   Resp 20   Ht 5\' 7"  (1.702 m)   Wt 171 lb (77.6 kg)   SpO2 95% Comment: RA  BMI 26.82 kg/m  72 year-old woman in no acute distress Alert and oriented x 3 with no focal deficits Lungs slightly diminished at right base but otherwise clear Incisions clean dry and intact Cardiac regular rate and rhythm  Diagnostic Tests: CHEST - 2 VIEW   COMPARISON:  03/28/2022   FINDINGS: Normal heart size, mediastinal contours, and pulmonary vascularity.   Bronchitic changes with persistent bibasilar atelectasis.   Lungs otherwise clear.   No pleural effusion or pneumothorax.   Osseous structures unremarkable.   IMPRESSION: Bronchitic changes with bibasilar atelectasis.     Electronically Signed   By: Lavonia Dana M.D.   On: 04/04/2022 15:23   I personally reviewed the chest x-ray images.  Some basilar atelectasis and postoperative changes.  Impression: Vanessa Oneill is a 72 year old woman with a past medical history significant for tobacco abuse, COPD, hypertension, hyperlipidemia, GI bleeding, pyelonephritis, sepsis, and diabetes.   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  Pathologic stage IA (T1b, N0).  Should not need any adjuvant therapy but will refer to oncology for long-term follow-up.  Postoperative pain-consistent with intercostal neuralgia.  She is reluctant to take gabapentin because of her nonalcoholic cirrhosis.  Also reluctant to use narcotics.  Mostly just taking Tylenol.  I did explain that she is going to have discomfort if she does not use medications for that.  Overall I think she is tolerating the pain very well if she can get by with as minimal narcotic use as she is doing.  Tobacco abuse-has not smoked since the day prior to  surgery.  Congratulated her for that but emphasized the need for lifelong cessation.  Cystic left lower lobe lung lesion-no change on serial CTs but did have activity on PET.  Will need close follow-up.  Would probably plan to do a CT at about 3 months for that.  Advised her not to drive until her pain is better.  She is already been doing so to some degree.  Cautioned her about the issues related to pain and avoiding accidents.   Plan: Referral to oncology at [facility] Will need close follow-up of left lung cystic lesion Return in 1 month with PA and lateral chest x-ray  Melrose Nakayama, MD [facility] [phone]

## 2022-04-07 ENCOUNTER — Inpatient Hospital Stay: Payer: Medicare Other | Attending: Internal Medicine | Admitting: Internal Medicine

## 2022-04-07 ENCOUNTER — Inpatient Hospital Stay: Payer: Medicare Other

## 2022-04-07 ENCOUNTER — Encounter: Payer: Self-pay | Admitting: Internal Medicine

## 2022-04-07 ENCOUNTER — Other Ambulatory Visit: Payer: Self-pay

## 2022-04-07 VITALS — BP 104/64 | HR 64 | Temp 97.4°F | Resp 17 | Ht 67.0 in | Wt 174.0 lb

## 2022-04-07 DIAGNOSIS — K7460 Unspecified cirrhosis of liver: Secondary | ICD-10-CM | POA: Diagnosis not present

## 2022-04-07 DIAGNOSIS — R161 Splenomegaly, not elsewhere classified: Secondary | ICD-10-CM | POA: Insufficient documentation

## 2022-04-07 DIAGNOSIS — I10 Essential (primary) hypertension: Secondary | ICD-10-CM | POA: Diagnosis not present

## 2022-04-07 DIAGNOSIS — C3431 Malignant neoplasm of lower lobe, right bronchus or lung: Secondary | ICD-10-CM

## 2022-04-07 DIAGNOSIS — F419 Anxiety disorder, unspecified: Secondary | ICD-10-CM | POA: Diagnosis not present

## 2022-04-07 DIAGNOSIS — F17210 Nicotine dependence, cigarettes, uncomplicated: Secondary | ICD-10-CM | POA: Diagnosis not present

## 2022-04-07 DIAGNOSIS — J449 Chronic obstructive pulmonary disease, unspecified: Secondary | ICD-10-CM | POA: Diagnosis not present

## 2022-04-07 DIAGNOSIS — K6389 Other specified diseases of intestine: Secondary | ICD-10-CM | POA: Diagnosis not present

## 2022-04-07 DIAGNOSIS — Z801 Family history of malignant neoplasm of trachea, bronchus and lung: Secondary | ICD-10-CM | POA: Diagnosis not present

## 2022-04-07 DIAGNOSIS — F32A Depression, unspecified: Secondary | ICD-10-CM | POA: Diagnosis not present

## 2022-04-07 DIAGNOSIS — E1159 Type 2 diabetes mellitus with other circulatory complications: Secondary | ICD-10-CM | POA: Insufficient documentation

## 2022-04-07 DIAGNOSIS — K219 Gastro-esophageal reflux disease without esophagitis: Secondary | ICD-10-CM

## 2022-04-07 DIAGNOSIS — Z902 Acquired absence of lung [part of]: Secondary | ICD-10-CM | POA: Diagnosis not present

## 2022-04-07 DIAGNOSIS — Z79899 Other long term (current) drug therapy: Secondary | ICD-10-CM | POA: Insufficient documentation

## 2022-04-07 NOTE — Progress Notes (Signed)
The proposed treatment discussed in conference is for discussion purpose only and is not a binding recommendation.  The patients have not been physically examined, or presented with their treatment options.  Therefore, final treatment plans cannot be decided.  

## 2022-04-07 NOTE — Progress Notes (Signed)
[facility] CONSULT NOTE  Patient Care Team: Lynnea Ferrier, MD as PCP - General (Internal Medicine)  REFERRING PROVIDER: Dr. 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  Right lower lobe nodule was measuring 5.6 mm.  She had repeat CT scan in 6 months and the lesion had grown to 11 mm.  - s/p right lower lobe lateral basilar segmentectomy with lymph node sampling by Dr. Dorris Fetch on 03/23/2022.  Pathology report as below.  I discussed with the patient about the staging, prognosis, role of adjuvant treatment and surveillance.  Pathology report was discussed in detail.  She has very early stage cancer staged IA2.  Clinical trials have not shown any benefit of adjuvant chemotherapy in such early stages.  Also for adjuvant targeted therapies such as EGFR chemoimmunotherapy did not include patients with stage Ia.  I do not see any indication for sending foundation 1 testing.    -Patient will be on surveillance.  On PET/CT scan from 02/13/2022 there was subsolid lesion in the left lung with mild SUV of 3.2 measuring 2.5 cm.  Could be infectious/inflammatory but neoplasm could not be excluded.  I will repeat CT chest with contrast in 3 months to monitor.  If the lesion is stable will change the monitoring to every 6  months for 3 years and then annually as per NCCN guidelines.  She had labs done with her PCP recently were reviewed.  # Small focus of hypermetabolic in rectum -Detected on PET/CT scan.  Scheduled for colonoscopy with Dr. 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  Follows with GI.  Orders Placed This Encounter  Procedures   CT Chest W Contrast    Standing Status:   Future    Standing Expiration Date:   04/07/2023    Scheduling Instructions:     In 3 months    Order Specific Question:   If indicated for the ordered procedure, I authorize the administration of contrast media per Radiology protocol    Answer:   Yes    Order Specific Question:   Does the patient have a contrast media/X-ray dye allergy?    Answer:   No    Order Specific Question:   Preferred imaging location?    Answer:   [facility]   RTC in 3 months for MD visit to discuss scans.  The total time spent in the appointment was 45 minutes encounter with patients including review of chart and various tests results, discussions about plan of care and coordination of care plan   All questions were answered. The patient knows to call the clinic with any problems, questions or concerns. No barriers to learning was detected.  Michaelyn Barter, MD 4/5/20242:55 PM   HISTORY OF PRESENTING ILLNESS:  Vanessa Oneill 72 y.o.  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  Patient was seen today in the clinic accompanied by nephew.  She has been dealing with surgical site pain and numbness in the right abdomen going into her back.  Her hydrocodone was recently increased by Dr. Graciela Husbands.  And also gabapentin.  Also reports shortness of breath.  Feels tired.  I have reviewed her  chart and materials related to her cancer extensively and collaborated history with the patient. 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  Other lung nodules were stable.  Follow-up in 6 months was recommended.  Repeat CT chest done on 01/25/2022 showed growing right lower lobe nodule to 11.6 mm.  Liver cirrhosis and splenomegaly.   02/13/2022 PET scan   PET scan  showed 11.6 mm right lower lobe pulmonary nodule with SUV of 2.5.  Low level hypermetabolic some associated with 2.5 cm peripheral subsolid lesion with multiple air cyst SUV of 3.2.  These may be infectious/inflammatory although low-grade neoplasm cannot be excluded.   03/23/2022 Definitive Surgery   S/p robotic assisted right lower lobe lateral basilar segmentectomy, lymph node sampling and intercostal nerve blocks levels 4 through 10 by Dr. Dorris Fetch   04/04/2022 Cancer Staging   Staging form: Lung, AJCC 8th Edition - Pathologic: Stage IA2 (pT1b, pN0, cM0) - Signed by Loreli Slot, MD on 04/04/2022    Pathology Results   A. RIGHT LUNG, LOWER LOBE, BRONCHIAL MARGIN, EXCISION: Negative for carcinoma, margin free  B. RIGHT LUNG, LOWER LOBE, LATERAL SEGMENT, NODULE, SEGMENTECTOMY: Invasive well to moderately differentiated squamous cell carcinoma Tumor measures 1.6 x 1.3 x 1.0 cm (pT1b) Margins free  C. LYMPH NODE, LEVEL 9, EXCISION: Benign anthracotic stroma Negative for lymph node  D. LYMPH NODE, LEVEL 7, EXCISION: One benign lymph node with adjacent calcified nodule, negative for carcinoma (0/1)  E. LYMPH NODE, LEVEL 7 #2, EXCISION: One benign lymph node, negative for carcinoma (0/1)  F. LYMPH NODE, LEVEL 12, EXCISION: One benign lymph node, negative for carcinoma (0/1)  G. LYMPH NODE, LEVEL 12 #2, EXCISION: One benign lymph  node, negative for carcinoma (0/1)  H. LYMPH NODE, LEVEL 13, EXCISION: One benign lymph node, negative for carcinoma (0/1)  I. LYMPH NODE, LEVEL 13 #2, EXCISION: One benign lymph node, negative for carcinoma (0/1)  J. LYMPH NODE, LEVEL 13 #3, EXCISION: Mucus with admixed benign bronchial cells and pulmonary macrophages  K. LYMPH NODE, 10R, EXCISION: One benign lymph node, negative for carcinoma (0/1)  L. LYMPH NODE, 4R, EXCISION: One benign lymph node, negative for carcinoma (0/1)   ONCOLOGY TABLE:  LUNG: Resection  Synchronous Tumors: Not applicable Total Number of Primary Tumors: 1 Procedure: Segmentectomy with lymph node sampling Specimen Laterality: Right Tumor Focality: Unifocal Tumor Site: Lower lobe, lateral segment Tumor Size: 1.6 x 1.3 x 1.0 cm Histologic Type: Squamous cell carcinoma Visceral Pleura Invasion: Not identified Direct Invasion of Adjacent Structures: No adjacent structures present Lymphovascular Invasion: Not identified Margins: All margins negative for invasive carcinoma      Closest Margin(s) to Invasive Carcinoma: 0.8 cm from stapled parenchymal margin Treatment Effect: No known presurgical therapy Regional Lymph Nodes:      Number of Lymph  Nodes Involved: 0      Number of Lymph Nodes Examined: 9                      Nodal Sites Examined: Stations 4R, 10R, 7, 12R and 13R      MEDICAL HISTORY:  Past Medical History:  Diagnosis Date   Acid reflux    Anxiety    Aortic atherosclerosis    Arthritis    Cerebral aneurysm    Colon adenomas    COPD (chronic obstructive pulmonary disease)    Coronary artery disease    Depression    Diabetes mellitus without complication    ESBL (extended spectrum beta-lactamase) producing bacteria infection    Headache    Hyperlipidemia    Hypertension    Liver disease    Lumbago    Mitral valve regurgitation    Non-alcoholic cirrhosis    Personal history of tobacco use, presenting hazards to health  11/12/2014   Raynaud's disease    Sleep apnea     SURGICAL HISTORY: Past Surgical History:  Procedure Laterality Date   ABDOMINAL HYSTERECTOMY     APPENDECTOMY     CHOLECYSTECTOMY     COLONOSCOPY WITH PROPOFOL N/A 11/18/2018   Procedure: COLONOSCOPY WITH PROPOFOL;  Surgeon: Toledo, Boykin Nearing, MD;  Location: [facility] ENDOSCOPY;  Service: Gastroenterology;  Laterality: N/A;   ESOPHAGOGASTRODUODENOSCOPY (EGD) WITH PROPOFOL N/A 07/24/2016   Procedure: ESOPHAGOGASTRODUODENOSCOPY (EGD) WITH PROPOFOL;  Surgeon: Wyline Mood, MD;  Location: [facility] ENDOSCOPY;  Service: Endoscopy;  Laterality: N/A;   ESOPHAGOGASTRODUODENOSCOPY (EGD) WITH PROPOFOL N/A 11/21/2016   Procedure: ESOPHAGOGASTRODUODENOSCOPY (EGD) WITH PROPOFOL;  Surgeon: Toledo, Boykin Nearing, MD;  Location: [facility] ENDOSCOPY;  Service: Gastroenterology;  Laterality: N/A;   ESOPHAGOGASTRODUODENOSCOPY (EGD) WITH PROPOFOL N/A 02/05/2018   Procedure: ESOPHAGOGASTRODUODENOSCOPY (EGD) WITH PROPOFOL;  Surgeon: Toledo, Boykin Nearing, MD;  Location: [facility] ENDOSCOPY;  Service: Endoscopy;  Laterality: N/A;   INTERCOSTAL NERVE BLOCK  03/23/2022   Procedure: INTERCOSTAL NERVE BLOCK;  Surgeon: Loreli Slot, MD;  Location: [facility] OR;  Service: Thoracic;;   LYMPH NODE BIOPSY  03/23/2022   Procedure: LYMPH NODE BIOPSY;  Surgeon: Loreli Slot, MD;  Location: [facility] OR;  Service: Thoracic;;   TONSILLECTOMY     XI ROBOTIC ASSISTED THORACOSCOPY- SEGMENTECTOMY Right 03/23/2022   Procedure: XI ROBOTIC ASSISTED THORACOSCOPY-RIGHT LOWER LOBE LATERAL BASILAR SEGMENTECTOMY;  Surgeon: Loreli Slot, MD;  Location: [facility] OR;  Service: Thoracic;  Laterality: Right;    SOCIAL HISTORY: Social History   Socioeconomic History   Marital status: Married    Spouse name: Not on file   Number of children: 2   Years of education: Not on file   Highest education level: Not on file  Occupational History   Not on file  Tobacco Use   Smoking status: Every Day    Packs/day: 1.00     Years: 48.00    Additional pack years: 0.00    Total pack years: 48.00    Types: Cigarettes   Smokeless tobacco: Never   Tobacco comments:    patient refused  Vaping Use   Vaping Use: Never used  Substance and Sexual Activity   Alcohol use: No    Alcohol/week: 0.0 standard drinks of alcohol   Drug use: No   Sexual activity: Not Currently  Other Topics Concern   Not on file  Social History Narrative   Live at home with husband   Social Determinants of Health   Financial Resource Strain: Low Risk  (  02/06/2017)   Overall Financial Resource Strain (CARDIA)    Difficulty of Paying Living Expenses: Not hard at all  Food Insecurity: No Food Insecurity (04/07/2022)   Hunger Vital Sign    Worried About Running Out of Food in the Last Year: Never true    Ran Out of Food in the Last Year: Never true  Transportation Needs: No Transportation Needs (03/24/2022)   PRAPARE - Administrator, Civil ServiceTransportation    Lack of Transportation (Medical): No    Lack of Transportation (Non-Medical): No  Physical Activity: Inactive (02/06/2017)   Exercise Vital Sign    Days of Exercise per Week: 0 days    Minutes of Exercise per Session: 0 min  Stress: Stress Concern Present (02/06/2017)   [employer] - Occupational Stress Questionnaire    Feeling of Stress : To some extent  Social Connections: Moderately Integrated (02/06/2017)   Social Connection and Isolation Panel [NHANES]    Frequency of Communication with Friends and Family: More than three times a week    Frequency of Social Gatherings with Friends and Family: Once a week    Attends Religious Services: More than 4 times per year    Active Member of [employer] Financial[employer]ubs or Organizations: No    Attends BankerClub or Organization Meetings: Never    Marital Status: Married  Catering managerntimate Partner Violence: Not At Risk (03/24/2022)   Humiliation, Afraid, Rape, and Kick questionnaire    Fear of Current or Ex-Partner: No    Emotionally Abused: No    Physically Abused:  No    Sexually Abused: No    FAMILY HISTORY: Family History  Problem Relation Age of Onset   Lung cancer Father    Heart Problems Father    Heart failure Mother    Breast cancer Neg Hx     ALLERGIES:  is allergic to codeine, tylenol with codeine #3 [acetaminophen-codeine], amoxicillin, lipitor [atorvastatin], lisinopril, and penicillins.  MEDICATIONS:  Current Outpatient Medications  Medication Sig Dispense Refill   albuterol (VENTOLIN HFA) 108 (90 Base) MCG/ACT inhaler Inhale 1-2 puffs into the lungs every 6 (six) hours as needed for wheezing or shortness of breath.     ALPRAZolam (XANAX) 0.25 MG tablet Take 0.25 mg by mouth daily as needed for anxiety or sleep.  5   busPIRone (BUSPAR) 7.5 MG tablet Take 7.5 mg by mouth 2 (two) times daily.     colestipol (COLESTID) 1 g tablet Take 1 g by mouth 2 (two) times daily.     cyclobenzaprine (FLEXERIL) 10 MG tablet Take 10 mg by mouth 3 (three) times daily as needed for muscle spasms.     gabapentin (NEURONTIN) 100 MG capsule Take 1 capsule (100 mg total) by mouth See admin instructions. Take 100 mg by mouth in the morning, 300 mg in the afternoon and 100 mg at bedtime. 60 capsule 1   glimepiride (AMARYL) 4 MG tablet Take 4 mg by mouth daily with breakfast.     HYDROcodone-acetaminophen (NORCO/VICODIN) 5-325 MG tablet Take 1 tablet by mouth every 8 (eight) hours as needed for moderate pain.      insulin NPH Human (HUMULIN N,NOVOLIN N) 100 UNIT/ML injection Inject 0.1 mLs (10 Units total) into the skin 2 (two) times daily before a meal. Use 30 units in the morning, 20 units during the day and 30 units at night. (Patient taking differently: Inject 30 Units into the skin daily as needed (blood sugar of 160 or above).) 10 mL 11   ipratropium (ATROVENT) 0.06 % nasal  spray Place 2 sprays into both nostrils in the morning and at bedtime.     metFORMIN (GLUCOPHAGE-XR) 500 MG 24 hr tablet Take 1,000 mg by mouth daily with supper.     NONFORMULARY OR  COMPOUNDED ITEM Apply 1 Pump topically 3 (three) times daily as needed (neuropathy in feet). Baclofen 2%, Diclofenac 5%, Gabapentin 6%, Tetracaine 3%     omeprazole (PRILOSEC) 40 MG capsule Take 40 mg by mouth daily.     spironolactone (ALDACTONE) 25 MG tablet Take 25 mg by mouth daily.     traZODone (DESYREL) 50 MG tablet Take 50 mg by mouth at bedtime.  0   No current facility-administered medications for this visit.    REVIEW OF SYSTEMS:   Pertinent information mentioned in HPI All other systems were reviewed with the patient and are negative.  PHYSICAL EXAMINATION: ECOG PERFORMANCE STATUS: 1 - Symptomatic but completely ambulatory  Vitals:   04/07/22 1413  BP: 104/64  Pulse: 64  Resp: 17  Temp: (!) 97.4 F (36.3 C)  SpO2: 94%   Filed Weights   04/07/22 1413  Weight: 174 lb (78.9 kg)    GENERAL:alert, no distress and comfortable SKIN: skin color, texture, turgor are normal, no rashes or significant lesions EYES: normal, conjunctiva are pink and non-injected, sclera clear OROPHARYNX:no exudate, no erythema and lips, buccal mucosa, and tongue normal  NECK: supple, thyroid normal size, non-tender, without nodularity LYMPH:  no palpable lymphadenopathy in the cervical, axillary or inguinal LUNGS: clear to auscultation and percussion with normal breathing effort HEART: regular rate & rhythm and no murmurs and no lower extremity edema ABDOMEN:abdomen soft, non-tender and normal bowel sounds Musculoskeletal:no cyanosis of digits and no clubbing  PSYCH: alert & oriented x 3 with fluent speech NEURO: no focal motor/sensory deficits  LABORATORY DATA:  I have reviewed the data as listed Lab Results  Component Value Date   WBC 5.1 03/28/2022   HGB 11.1 (L) 03/28/2022   HCT 32.7 (L) 03/28/2022   MCV 91.3 03/28/2022   PLT 81 (L) 03/28/2022   Recent Labs    03/21/22 1330 03/24/22 0019 03/25/22 0012 03/27/22 0026  NA 131* 125* 129* 129*  K 4.3 4.3 4.2 4.9  CL 104 100  102 100  CO2 18* 17* 22 22  GLUCOSE 285* 377* 118* 54*  BUN CREATININE 0.97 1.04* 0.89 0.98  CALCIUM 8.7* 7.8* 7.7* 8.5*  GFRNONAA >60 58* >60 >60  PROT 6.7  --  5.3*  --   ALBUMIN 3.5  --  2.7*  --   AST 29  --  32  --   ALT 16  --  22  --   ALKPHOS 118  --  75  --   BILITOT 0.9  --  0.9  --     RADIOGRAPHIC STUDIES: I have personally reviewed the radiological images as listed and agreed with the findings in the report. DG Chest 2 View  Result Date: 04/04/2022 CLINICAL DATA:  RIGHT lung nodule removed 1.5 weeks ago, diabetes mellitus, COPD EXAM: CHEST - 2 VIEW COMPARISON:  03/28/2022 FINDINGS: Normal heart size, mediastinal contours, and pulmonary vascularity. Bronchitic changes with persistent bibasilar atelectasis. Lungs otherwise clear. No pleural effusion or pneumothorax. Osseous structures unremarkable. IMPRESSION: Bronchitic changes with bibasilar atelectasis. Electronically Signed   By: Ulyses Southward M.D.   On: 04/04/2022 15:23   DG Chest 2 View  Result Date: 03/28/2022 CLINICAL DATA:  Pneumothorax EXAM: CHEST - 2 VIEW COMPARISON:  Previous  studies including the examination of 03/27/2022 FINDINGS: There is interval decrease in size of small right apical pneumothorax. Patchy densities in right lower lung fields suggest atelectasis/pneumonia. There is improvement in the aeration in the lower lung fields. There are no new infiltrates. There are no signs of alveolar pulmonary edema. There is blunting of lateral CP angles. IMPRESSION: There is interval decrease in small right apical pneumothorax. Patchy densities in right lower lung field suggesting atelectasis/pneumonia. There is improvement in the aeration of both lungs since 03/27/2022. Electronically Signed   By: Ernie Avena M.D.   On: 03/28/2022 08:13   DG CHEST PORT 1 VIEW  Result Date: 03/27/2022 CLINICAL DATA:  Pneumothorax.  Status post partial lobectomy EXAM: PORTABLE CHEST 1 VIEW COMPARISON:  X-ray earlier  03/27/2022 FINDINGS: Status post removal of the right chest tube. Tiny right apical pneumothorax seen. Similar to the prior. Subtle opacities along the lung bases as well as interstitial changes. No effusion. Stable cardiopericardial silhouette. Overlapping cardiac leads. IMPRESSION: Interval removal of the right chest tube with stable trace right apical pneumothorax. Electronically Signed   By: Karen Kays M.D.   On: 03/27/2022 14:44   DG Chest 2 View  Result Date: 03/27/2022 CLINICAL DATA:  Right lower lobe nodule.  Follow-up exam. EXAM: CHEST - 2 VIEW COMPARISON:  ChestRadiograph 03/26/2022 FINDINGS: No pleural effusion. Previously seen pneumothorax at the right lung apex is not definitively visualized on this exam. Unchanged cardiac and mediastinal contours. Right-sided thoracostomy tube in place. Hazy opacity in the right lung base could represent atelectasis, but superimposed infection is not excluded. Visualized upper abdomen is unremarkable. No radiographically apparent displaced rib fractures. IMPRESSION: 1. Previously seen pneumothorax at the right lung apex is not definitively visualized on this exam. Right-sided thoracostomy tube in place. 2. Hazy opacity in the right lung base could represent atelectasis, but superimposed infection is not excluded. Electronically Signed   By: Lorenza Cambridge M.D.   On: 03/27/2022 07:54   DG Chest 1 View  Result Date: 03/26/2022 CLINICAL DATA:  [id number] Surgery follow-up [id number] EXAM: CHEST  1 VIEW COMPARISON:  the previous day's study FINDINGS: Stable right chest tube directed medially towards the apex. Small apical pneumothorax, pleural margin projecting below the posterior aspect left third rib. Coarse interstitial and airspace disease involving bases more than apices, right greater than left, largely stable. Heart size and mediastinal contours are within normal limits. No effusion. Visualized bones unremarkable. IMPRESSION: 1. Small right apical pneumothorax with  chest tube in place. 2. Stable bilateral airspace disease. Electronically Signed   By: Corlis Leak M.D.   On: 03/26/2022 10:34   DG Chest Port 1 View  Result Date: 03/25/2022 CLINICAL DATA:  Table formatting from the original note was not included. S/P partial lobectomy of lung EXAM: PORTABLE CHEST - 1 VIEW COMPARISON:  03/24/2022 FINDINGS: Right chest tube directed towards the apex with no pneumothorax, small amount of subcutaneous emphysema laterally. Peripheral interstitial opacities bilaterally slightly increased. Poorly marginated airspace opacities at the right lung base stable. Heart size and mediastinal contours are within normal limits. Blunting of lateral costophrenic angles. Visualized bones unremarkable. IMPRESSION: 1. Right chest tube without pneumothorax. 2. Slight increase in peripheral interstitial opacities. Electronically Signed   By: Corlis Leak M.D.   On: 03/25/2022 07:39   DG Chest Port 1 View  Result Date: 03/24/2022 CLINICAL DATA:  [id number] S/P partial lobectomy of lung [id number] EXAM: PORTABLE CHEST - 1 VIEW COMPARISON:  03/23/2022 FINDINGS: Some interval improvement in the interstitial  edema or infiltrates. Patchy airspace opacities at the right lung base persist. Heart size and mediastinal contours are within normal limits. No effusion. Visualized bones unremarkable. Right chest drain stable in position with no pneumothorax. IMPRESSION: Some interval improvement in interstitial edema or infiltrates. Electronically Signed   By: Corlis Leak M.D.   On: 03/24/2022 08:59   DG Chest 2 View  Result Date: 03/23/2022 CLINICAL DATA:  Preoperative evaluation for right lower lobe nodule resection EXAM: CHEST - 2 VIEW COMPARISON:  CT chest delete that dated 01/25/2022 FINDINGS: Known peripheral right lower lobe nodule is better evaluated on prior CT. Left lower lung linear opacities. No pleural effusion or pneumothorax. The heart size and mediastinal contours are within normal limits. The visualized  skeletal structures are unremarkable. Right upper quadrant surgical clips. IMPRESSION: 1. Known peripheral right lower lobe nodule is better evaluated on prior CT. 2. Left lower lung linear opacities, likely atelectasis. Electronically Signed   By: Agustin Cree M.D.   On: 03/23/2022 14:02   DG Chest Port 1 View  Result Date: 03/23/2022 CLINICAL DATA:  Status post partial right lower lobectomy EXAM: PORTABLE CHEST 1 VIEW COMPARISON:  Chest radiograph dated 03/21/2022, CT chest dated 01/25/2022 FINDINGS: Lines/tubes: Right apical pleural catheter. Chest: Mild bilateral interstitial opacities. Bibasilar patchy opacities. Pleura: Trace bilateral pleural effusions. No definite pneumothorax. Heart/mediastinum: Similar enlarged cardiomediastinal silhouette. Bones: No acute osseous abnormality. IMPRESSION: 1. Right apical pleural catheter in place. No definite pneumothorax. 2. Mild pulmonary edema and trace bilateral pleural effusions. 3. Bibasilar opacities, likely atelectasis. Electronically Signed   By: Agustin Cree M.D.   On: 03/23/2022 13:59

## 2022-04-19 ENCOUNTER — Inpatient Hospital Stay (HOSPITAL_BASED_OUTPATIENT_CLINIC_OR_DEPARTMENT_OTHER): Payer: Medicare Other | Admitting: Hospice and Palliative Medicine

## 2022-04-19 DIAGNOSIS — C3431 Malignant neoplasm of lower lobe, right bronchus or lung: Secondary | ICD-10-CM

## 2022-04-19 NOTE — Progress Notes (Signed)
Multidisciplinary Oncology Council Documentation  Vanessa Oneill was presented by our [facility] on 04/19/2022, which included representatives from:  Palliative Care Dietitian  Physical/Occupational Therapist Nurse Navigator Genetics Speech Therapist Social work Survivorship Engineer, site   Vanessa Oneill currently presents with history of lung cancer  We reviewed previous medical and familial history, history of present illness, and recent lab results along with all available histopathologic and imaging studies. The MOC considered available treatment options and made the following recommendations/referrals:  None currently   The MOC is a meeting of clinicians from various specialty areas who evaluate and discuss patients for whom a multidisciplinary approach is being considered. Final determinations in the plan of care are those of the provider(s).   Today's extended care, comprehensive team conference, Vanessa Oneill was not present for the discussion and was not examined.

## 2022-04-26 ENCOUNTER — Ambulatory Visit: Payer: Medicare Other | Admitting: Anesthesiology

## 2022-04-26 ENCOUNTER — Encounter: Payer: Self-pay | Admitting: Internal Medicine

## 2022-04-26 ENCOUNTER — Encounter
Admission: RE | Disposition: A | Discharge status: Home / Self Care | Payer: Self-pay | Source: Ambulatory Visit | Attending: Internal Medicine

## 2022-04-26 ENCOUNTER — Ambulatory Visit
Admission: RE | Admit: 2022-04-26 | Discharge: 2022-04-26 | Disposition: A | Discharge status: Home / Self Care | Payer: Medicare Other | Source: Ambulatory Visit | Attending: Internal Medicine | Admitting: Internal Medicine

## 2022-04-26 DIAGNOSIS — E119 Type 2 diabetes mellitus without complications: Secondary | ICD-10-CM | POA: Diagnosis not present

## 2022-04-26 DIAGNOSIS — D123 Benign neoplasm of transverse colon: Secondary | ICD-10-CM | POA: Insufficient documentation

## 2022-04-26 DIAGNOSIS — I8510 Secondary esophageal varices without bleeding: Secondary | ICD-10-CM | POA: Diagnosis not present

## 2022-04-26 DIAGNOSIS — J449 Chronic obstructive pulmonary disease, unspecified: Secondary | ICD-10-CM | POA: Insufficient documentation

## 2022-04-26 DIAGNOSIS — I10 Essential (primary) hypertension: Secondary | ICD-10-CM | POA: Insufficient documentation

## 2022-04-26 DIAGNOSIS — R599 Enlarged lymph nodes, unspecified: Secondary | ICD-10-CM | POA: Insufficient documentation

## 2022-04-26 DIAGNOSIS — K2980 Duodenitis without bleeding: Secondary | ICD-10-CM | POA: Insufficient documentation

## 2022-04-26 DIAGNOSIS — E785 Hyperlipidemia, unspecified: Secondary | ICD-10-CM | POA: Insufficient documentation

## 2022-04-26 DIAGNOSIS — K766 Portal hypertension: Secondary | ICD-10-CM | POA: Insufficient documentation

## 2022-04-26 DIAGNOSIS — I2510 Atherosclerotic heart disease of native coronary artery without angina pectoris: Secondary | ICD-10-CM | POA: Diagnosis not present

## 2022-04-26 DIAGNOSIS — I868 Varicose veins of other specified sites: Secondary | ICD-10-CM | POA: Diagnosis not present

## 2022-04-26 DIAGNOSIS — Z87891 Personal history of nicotine dependence: Secondary | ICD-10-CM | POA: Insufficient documentation

## 2022-04-26 DIAGNOSIS — K635 Polyp of colon: Secondary | ICD-10-CM | POA: Insufficient documentation

## 2022-04-26 DIAGNOSIS — Z86010 Personal history of colonic polyps: Secondary | ICD-10-CM | POA: Diagnosis not present

## 2022-04-26 DIAGNOSIS — K7460 Unspecified cirrhosis of liver: Secondary | ICD-10-CM | POA: Diagnosis not present

## 2022-04-26 DIAGNOSIS — G4730 Sleep apnea, unspecified: Secondary | ICD-10-CM | POA: Insufficient documentation

## 2022-04-26 DIAGNOSIS — R933 Abnormal findings on diagnostic imaging of other parts of digestive tract: Secondary | ICD-10-CM | POA: Diagnosis not present

## 2022-04-26 DIAGNOSIS — K3189 Other diseases of stomach and duodenum: Secondary | ICD-10-CM | POA: Diagnosis not present

## 2022-04-26 HISTORY — PX: ESOPHAGOGASTRODUODENOSCOPY (EGD) WITH PROPOFOL: SHX5813

## 2022-04-26 HISTORY — PX: COLONOSCOPY WITH PROPOFOL: SHX5780

## 2022-04-26 LAB — GLUCOSE, CAPILLARY: Glucose-Capillary: 119 mg/dL — ABNORMAL HIGH (ref 70–99)

## 2022-04-26 SURGERY — COLONOSCOPY WITH PROPOFOL
Anesthesia: General

## 2022-04-26 MED ORDER — PROPOFOL 500 MG/50ML IV EMUL
INTRAVENOUS | Status: DC | PRN
  Administered 2022-04-26: 150 ug/kg/min via INTRAVENOUS

## 2022-04-26 MED ORDER — EPHEDRINE 5 MG/ML INJ
INTRAVENOUS | Status: AC
  Filled 2022-04-26: qty 5

## 2022-04-26 MED ORDER — PROPOFOL 10 MG/ML IV BOLUS
INTRAVENOUS | Status: DC | PRN
  Administered 2022-04-26: 100 mg via INTRAVENOUS

## 2022-04-26 MED ORDER — PROPOFOL 1000 MG/100ML IV EMUL
INTRAVENOUS | Status: AC
  Filled 2022-04-26: qty 100

## 2022-04-26 MED ORDER — SODIUM CHLORIDE 0.9 % IV SOLN: INTRAVENOUS | Status: DC

## 2022-04-26 MED ORDER — ONDANSETRON HCL 4 MG/2ML IJ SOLN
4.0000 mg | Freq: Once | INTRAMUSCULAR | Status: AC
  Administered 2022-04-26: 4 mg via INTRAVENOUS

## 2022-04-26 MED ORDER — ONDANSETRON HCL 4 MG/2ML IJ SOLN
INTRAMUSCULAR | Status: AC
  Filled 2022-04-26: qty 2

## 2022-04-26 MED ORDER — LIDOCAINE HCL (CARDIAC) PF 100 MG/5ML IV SOSY
PREFILLED_SYRINGE | INTRAVENOUS | Status: DC | PRN
  Administered 2022-04-26: 80 mg via INTRAVENOUS

## 2022-04-26 MED ORDER — EPHEDRINE SULFATE (PRESSORS) 50 MG/ML IJ SOLN
INTRAMUSCULAR | Status: DC | PRN
  Administered 2022-04-26 (×2): 10 mg via INTRAVENOUS

## 2022-04-26 NOTE — Interval H&P Note (Signed)
History and Physical Interval Note:  04/26/2022 9:38 AM  Vanessa Oneill  has presented today for surgery, with the diagnosis of  793.4 (ICD-9-CM) - R93.3 (ICD-10-CM) - Abnormal finding on GI tract imaging  571.5, 456.21 (ICD-9-CM) - K74.60, I85.10 (ICD-10-CM) - Esophageal varices in cirrhosis (CMS/HHS-HCC).  The various methods of treatment have been discussed with the patient and family. After consideration of risks, benefits and other options for treatment, the patient has consented to  Procedure(s): COLONOSCOPY WITH PROPOFOL (N/A) ESOPHAGOGASTRODUODENOSCOPY (EGD) WITH PROPOFOL (N/A) as a surgical intervention.  The patient's history has been reviewed, patient examined, no change in status, stable for surgery.  I have reviewed the patient's chart and labs.  Questions were answered to the patient's satisfaction.     [place] [employer]

## 2022-04-26 NOTE — Transfer of Care (Signed)
Immediate Anesthesia Transfer of Care Note  Patient: Vanessa Oneill  Procedure(s) Performed: COLONOSCOPY WITH PROPOFOL ESOPHAGOGASTRODUODENOSCOPY (EGD) WITH PROPOFOL  Patient Location: PACU  Anesthesia Type:General  Level of Consciousness: awake and sedated  Airway & Oxygen Therapy: Patient Spontanous Breathing and Patient connected to nasal cannula oxygen  Post-op Assessment: Report given to RN and Post -op Vital signs reviewed and stable  Post vital signs: Reviewed and stable  Last Vitals:  Vitals Value Taken Time  BP    Temp    Pulse    Resp    SpO2      Last Pain:  Vitals:   04/26/22 0856  TempSrc: Temporal         Complications: There were no known notable events for this encounter.

## 2022-04-26 NOTE — Anesthesia Postprocedure Evaluation (Signed)
Anesthesia Post Note  Patient: Vanessa Oneill  Procedure(s) Performed: COLONOSCOPY WITH PROPOFOL ESOPHAGOGASTRODUODENOSCOPY (EGD) WITH PROPOFOL  Patient location during evaluation: Endoscopy Anesthesia Type: General Level of consciousness: awake and alert Pain management: pain level controlled Vital Signs Assessment: post-procedure vital signs reviewed and stable Respiratory status: spontaneous breathing, nonlabored ventilation, respiratory function stable and patient connected to nasal cannula oxygen Cardiovascular status: blood pressure returned to baseline and stable Postop Assessment: no apparent nausea or vomiting Anesthetic complications: no   There were no known notable events for this encounter.   Last Vitals:  Vitals:   04/26/22 1037 04/26/22 1047  BP: 117/69 121/69  Pulse:    Resp:    Temp:    SpO2:      Last Pain:  Vitals:   04/26/22 1059  TempSrc:   PainSc: 0-No pain                 Corinda Gubler

## 2022-04-26 NOTE — H&P (Signed)
Outpatient short stay form Pre-procedure 04/26/2022 9:34 AM Vanessa Oneill K. Norma Fredrickson, M.D.  Primary Physician: Daniel Nones III, M.D.  Reason for visit:  Portal hypertension, esophageal varices, Abnormal PET scan of the rectum  History of present illness:  Vanessa Oneill is a pleasant 72 year old female presenting for findings on PET CT scan revealing hypermetabolic area in the proximal rectum. Patient has a history of multiple hyperplastic polyps in the rectosigmoid region from 2020 colonoscopy that I performed. No history of precancerous colon polyps or family history of colon cancer. She has a history of smoking and has been diagnosed with enlarging left pulmonary nodule that is going to be removed by surgical resection next week on March 21. Patient is interested in having the colonoscopy repeated as well as for follow-up of her history of cirrhosis with portal hypertension and esophageal varices. She reportedly had an episode of variceal bleed several years ago and her first procedure with me in 2018 involved prophylactically banding two columns of esophageal varices.     Current Facility-Administered Medications:    0.9 %  sodium chloride infusion, , Intravenous, Continuous, [place], Boykin Nearing, MD, Last Rate: 20 mL/hr at 04/26/22 0911, New Bag at 04/26/22 0911  Medications Prior to Admission  Medication Sig Dispense Refill Last Dose   albuterol (VENTOLIN HFA) 108 (90 Base) MCG/ACT inhaler Inhale 1-2 puffs into the lungs every 6 (six) hours as needed for wheezing or shortness of breath.   Past Week   ALPRAZolam (XANAX) 0.25 MG tablet Take 0.25 mg by mouth daily as needed for anxiety or sleep.  5 04/25/2022   amLODipine (NORVASC) 2.5 MG tablet Take 2.5 mg by mouth daily.   04/25/2022   insulin NPH Human (HUMULIN N,NOVOLIN N) 100 UNIT/ML injection Inject 0.1 mLs (10 Units total) into the skin 2 (two) times daily before a meal. Use 30 units in the morning, 20 units during the day and 30 units at night.  (Patient taking differently: Inject 30 Units into the skin daily as needed (blood sugar of 160 or above).) 10 mL 11 Past Week   metFORMIN (GLUCOPHAGE-XR) 500 MG 24 hr tablet Take 1,000 mg by mouth daily with supper.   Past Week   traZODone (DESYREL) 50 MG tablet Take 50 mg by mouth at bedtime.  0 04/25/2022   busPIRone (BUSPAR) 7.5 MG tablet Take 7.5 mg by mouth 2 (two) times daily.      colestipol (COLESTID) 1 g tablet Take 1 g by mouth 2 (two) times daily.      cyclobenzaprine (FLEXERIL) 10 MG tablet Take 10 mg by mouth 3 (three) times daily as needed for muscle spasms.      gabapentin (NEURONTIN) 100 MG capsule Take 1 capsule (100 mg total) by mouth See admin instructions. Take 100 mg by mouth in the morning, 300 mg in the afternoon and 100 mg at bedtime. 60 capsule 1    glimepiride (AMARYL) 4 MG tablet Take 4 mg by mouth daily with breakfast.      HYDROcodone-acetaminophen (NORCO/VICODIN) 5-325 MG tablet Take 1 tablet by mouth every 8 (eight) hours as needed for moderate pain.       ipratropium (ATROVENT) 0.06 % nasal spray Place 2 sprays into both nostrils in the morning and at bedtime.      NONFORMULARY OR COMPOUNDED ITEM Apply 1 Pump topically 3 (three) times daily as needed (neuropathy in feet). Baclofen 2%, Diclofenac 5%, Gabapentin 6%, Tetracaine 3%      omeprazole (PRILOSEC) 40 MG capsule Take 40  mg by mouth daily.      spironolactone (ALDACTONE) 25 MG tablet Take 25 mg by mouth daily.        Allergies  Allergen Reactions   Codeine    Tylenol With Codeine #3 [Acetaminophen-Codeine]     Chest felt like it would burst open   Amoxicillin Itching and Rash   Lipitor [Atorvastatin] Other (See Comments)    Muscle Pain   Lisinopril Cough   Penicillins Itching and Rash     Past Medical History:  Diagnosis Date   Acid reflux    Anxiety    Aortic atherosclerosis    Arthritis    Cerebral aneurysm    Colon adenomas    COPD (chronic obstructive pulmonary disease)    Coronary artery  disease    Depression    Diabetes mellitus without complication    ESBL (extended spectrum beta-lactamase) producing bacteria infection    Headache    Hyperlipidemia    Hypertension    Liver disease    Lumbago    Mitral valve regurgitation    Non-alcoholic cirrhosis    Personal history of tobacco use, presenting hazards to health 11/12/2014   Raynaud's disease    Sleep apnea     Review of systems:  Otherwise negative.    Physical Exam  Gen: Alert, oriented. Appears stated age.  HEENT: [place]/AT. PERRLA. Lungs: CTA, no wheezes. CV: RR nl S1, S2. Abd: soft, benign, no masses. BS+ Ext: No edema. Pulses 2+    Planned procedures: Proceed with EGD and colonoscopy. The patient understands the nature of the planned procedure, indications, risks, alternatives and potential complications including but not limited to bleeding, infection, perforation, damage to internal organs and possible oversedation/side effects from anesthesia. The patient agrees and gives consent to proceed.  Please refer to procedure notes for findings, recommendations and patient disposition/instructions.     Gricel Copen K. Norma Fredrickson, M.D. Gastroenterology 04/26/2022  9:34 AM

## 2022-04-26 NOTE — Op Note (Signed)
[facility] Gastroenterology Patient Name: Vanessa Oneill Fake Procedure Date: 04/26/2022 9:36 AM MRN: [id number] Account #: [id number] Date of Birth: 04-19-1950 Admit Type: Outpatient Age: 72 Room: [facility] ENDO ROOM 2 Gender: Female Note Status: Finalized Instrument Name: Patton Salles Endoscope [id number] Procedure:             Upper GI endoscopy Indications:           Cirrhosis with suspected esophageal varices, Portal                         venous hypertension Providers:             Boykin Nearing. Norma Fredrickson MD, MD Referring MD:          Daniel Nones, MD (Referring MD) Medicines:             Propofol per Anesthesia Complications:         No immediate complications. Estimated blood loss: None. Procedure:             Pre-Anesthesia Assessment:                        - The risks and benefits of the procedure and the                         sedation options and risks were discussed with the                         patient. All questions were answered and informed                         consent was obtained.                        - Patient identification and proposed procedure were                         verified prior to the procedure by the nurse. The                         procedure was verified in the procedure room.                        - ASA Grade Assessment: III - A patient with severe                         systemic disease.                        - After reviewing the risks and benefits, the patient                         was deemed in satisfactory condition to undergo the                         procedure.                        After obtaining informed consent, the endoscope was  passed under direct vision. Throughout the procedure,                         the patient's blood pressure, pulse, and oxygen                         saturations were monitored continuously. The Endoscope                         was introduced through the mouth, and advanced  to the                         third part of duodenum. The upper GI endoscopy was                         accomplished without difficulty. The patient tolerated                         the procedure well. Findings:      Grade II varices were found in the lower third of the esophagus. Two       bands were successfully placed with complete eradication, resulting in       deflation of varices. There was no bleeding during and at the end of the       procedure.      Moderate portal hypertensive gastropathy was found in the entire       examined stomach.      There is no endoscopic evidence of varices in the entire examined       stomach.      A single 15 mm mucosal nodule with a localized distribution was found in       the duodenal bulb. The nodule was Paris classification Is (protruding,       sessile). Biopsies were taken with a cold forceps for histology.      The exam of the duodenum was otherwise normal. Impression:            - Grade II esophageal varices. Completely eradicated.                         Banded.                        - Portal hypertensive gastropathy.                        - Mucosal nodule found in the duodenum. Biopsied. Recommendation:        - Repeat upper endoscopy in 2 months for retreatment.                        - Proceed with colonoscopy Procedure Code(s):     --- Professional ---                        [id number], Esophagogastroduodenoscopy, flexible,                         transoral; with band ligation of esophageal/gastric                         varices  13086, Esophagogastroduodenoscopy, flexible,                         transoral; with biopsy, single or multiple Diagnosis Code(s):     --- Professional ---                        K74.60, Unspecified cirrhosis of liver                        K31.89, Other diseases of stomach and duodenum                        K76.6, Portal hypertension                        I85.10, Secondary  esophageal varices without bleeding CPT copyright 2022 American Medical Association. All rights reserved. The codes documented in this report are preliminary and upon coder review may  be revised to meet current compliance requirements. Stanton Kidney MD, MD 04/26/2022 10:05:36 AM This report has been signed electronically. Number of Addenda: 0 Note Initiated On: 04/26/2022 9:36 AM Estimated Blood Loss:  Estimated blood loss: none.      [facility]

## 2022-04-26 NOTE — Anesthesia Preprocedure Evaluation (Signed)
Anesthesia Evaluation  Patient identified by MRN, date of birth, ID band Patient awake    Reviewed: Allergy & Precautions, NPO status , Patient's Chart, lab work & pertinent test results  History of Anesthesia Complications Negative for: history of anesthetic complications  Airway Mallampati: II  TM Distance: >3 FB Neck ROM: Full    Dental no notable dental hx. (+) Teeth Intact   Pulmonary sleep apnea , COPD, Current Smoker and Patient abstained from smoking. Lung cancer s/p resection last month   Pulmonary exam normal breath sounds clear to auscultation       Cardiovascular Exercise Tolerance: Good METShypertension, + CAD  (-) Past MI (-) dysrhythmias  Rhythm:Regular Rate:Normal - Systolic murmurs    Neuro/Psych  Headaches PSYCHIATRIC DISORDERS Anxiety Depression       GI/Hepatic ,neg GERD  ,,(+)     (-) substance abuse    Endo/Other  diabetes    Renal/GU negative Renal ROS     Musculoskeletal   Abdominal   Peds  Hematology   Anesthesia Other Findings Past Medical History: No date: Acid reflux No date: Anxiety No date: Aortic atherosclerosis No date: Arthritis No date: Cerebral aneurysm No date: Colon adenomas No date: COPD (chronic obstructive pulmonary disease) No date: Coronary artery disease No date: Depression No date: Diabetes mellitus without complication No date: ESBL (extended spectrum beta-lactamase) producing bacteria  infection No date: Headache No date: Hyperlipidemia No date: Hypertension No date: Liver disease No date: Lumbago No date: Mitral valve regurgitation No date: Non-alcoholic cirrhosis 11/12/2014: Personal history of tobacco use, presenting hazards to  health No date: Raynaud's disease No date: Sleep apnea  Reproductive/Obstetrics                              Anesthesia Physical Anesthesia Plan  ASA: 3  Anesthesia Plan: General   Post-op  Pain Management: Minimal or no pain anticipated   Induction: Intravenous  PONV Risk Score and Plan: 2 and Propofol infusion, TIVA and Ondansetron  Airway Management Planned: Nasal Cannula  Additional Equipment: None  Intra-op Plan:   Post-operative Plan:   Informed Consent: I have reviewed the patients History and Physical, chart, labs and discussed the procedure including the risks, benefits and alternatives for the proposed anesthesia with the patient or authorized representative who has indicated his/her understanding and acceptance.     Dental advisory given  Plan Discussed with: CRNA and Surgeon  Anesthesia Plan Comments: (Discussed risks of anesthesia with patient, including possibility of difficulty with spontaneous ventilation under anesthesia necessitating airway intervention, PONV, and rare risks such as cardiac or respiratory or neurological events, and allergic reactions. Discussed the role of CRNA in patient's perioperative care. Patient understands.)         Anesthesia Quick Evaluation

## 2022-04-26 NOTE — Op Note (Addendum)
[facility] Gastroenterology Patient Name: Vanessa Oneill Procedure Date: 04/26/2022 9:35 AM MRN: [id number] Account #: [id number] Date of Birth: [birth year] Admit Type: Outpatient Age: 72 Room: [facility] ENDO ROOM 2 Gender: Female Note Status: Finalized Instrument Name: Nelda Marseille [id number] Procedure:             Colonoscopy Indications:           Abnormal PET scan of the GI tract Providers:             Royce Macadamia K. Norma Fredrickson MD, MD Referring MD:          Daniel Nones, MD (Referring MD) Medicines:             Propofol per Anesthesia Complications:         No immediate complications. Estimated blood loss: None. Procedure:             Pre-Anesthesia Assessment:                        - Prior to the procedure, a History and Physical was                         performed, and patient medications, allergies and                         sensitivities were reviewed. The patient's tolerance                         of previous anesthesia was reviewed.                        - ASA Grade Assessment: III - A patient with severe                         systemic disease.                        - After reviewing the risks and benefits, the patient                         was deemed in satisfactory condition to undergo the                         procedure.                        After obtaining informed consent, the colonoscope was                         passed under direct vision. Throughout the procedure,                         the patient's blood pressure, pulse, and oxygen                         saturations were monitored continuously. The                         Colonoscope was introduced through the anus and  advanced to the the cecum, identified by appendiceal                         orifice and ileocecal valve. The colonoscopy was                         performed without difficulty. The patient tolerated                         the procedure well. The quality  of the bowel                         preparation was adequate. The ileocecal valve,                         appendiceal orifice, and rectum were photographed. Findings:      The perianal and digital rectal examinations were normal. Pertinent       negatives include normal sphincter tone and no palpable rectal lesions.      Large, non-bleeding rectal varices were found.      An 8 mm polyp was found in the transverse colon. The polyp was sessile.       The polyp was removed with a cold snare. Resection and retrieval were       complete.      A 10 mm polyp was found in the sigmoid colon. The polyp was sessile. The       polyp was removed with a hot snare. Resection and retrieval were       complete.      Large, non-bleeding rectal varices were found.      The exam was otherwise without abnormality. Impression:            - Rectal varices.                        - One 8 mm polyp in the transverse colon, removed with                         a cold snare. Resected and retrieved.                        - One 10 mm polyp in the sigmoid colon, removed with a                         hot snare. Resected and retrieved.                        - Rectal varices.                        - The examination was otherwise normal. Recommendation:        - Patient has a contact number available for                         emergencies. The signs and symptoms of potential                         delayed complications were discussed with the patient.  Return to normal activities tomorrow. Written                         discharge instructions were provided to the patient.                        - Await pathology results from EGD, also performed                         today.                        - Repeat EGD in 2 months for variceal retreatment.                        - You do NOT require further colon cancer screening                         measures (Annual stool testing (i.e.  hemoccult, FIT,                         cologuard), sigmoidoscopy, colonoscopy or CT                         colonography). You should share this recommendation                         with your Primary Care provider.                        - IF another PET scan is performed in the future, it                         is very likely that rectal pathology noted is related                         to large rectal varices.                        - Return to physician assistant in 1 month.                        - Telephone GI office to schedule appointment.                        - The findings and recommendations were discussed with                         the patient. Procedure Code(s):     --- Professional ---                        [phone], Colonoscopy, flexible; with removal of                         tumor(s), polyp(s), or other lesion(s) by snare                         technique Diagnosis Code(s):     --- Professional ---  R93.3, Abnormal findings on diagnostic imaging of                         other parts of digestive tract                        K64.8, Other hemorrhoids                        D12.5, Benign neoplasm of sigmoid colon                        D12.3, Benign neoplasm of transverse colon (hepatic                         flexure or splenic flexure) CPT copyright 2022 American Medical Association. All rights reserved. The codes documented in this report are preliminary and upon coder review may  be revised to meet current compliance requirements. Stanton Kidney MD, MD 04/26/2022 10:30:10 AM This report has been signed electronically. Number of Addenda: 0 Note Initiated On: 04/26/2022 9:35 AM Scope Withdrawal Time: 0 hours 8 minutes 20 seconds  Total Procedure Duration: 0 hours 14 minutes 57 seconds  Estimated Blood Loss:  Estimated blood loss: none.      [facility]

## 2022-04-27 ENCOUNTER — Encounter: Payer: Self-pay | Admitting: Internal Medicine

## 2022-04-27 LAB — SURGICAL PATHOLOGY

## 2022-05-01 ENCOUNTER — Other Ambulatory Visit: Payer: Self-pay | Admitting: Thoracic Surgery (Cardiothoracic Vascular Surgery)

## 2022-05-01 DIAGNOSIS — C3490 Malignant neoplasm of unspecified part of unspecified bronchus or lung: Secondary | ICD-10-CM

## 2022-05-01 NOTE — Progress Notes (Signed)
c 

## 2022-05-02 ENCOUNTER — Encounter: Payer: Self-pay | Admitting: Thoracic Surgery (Cardiothoracic Vascular Surgery)

## 2022-05-02 ENCOUNTER — Ambulatory Visit (INDEPENDENT_AMBULATORY_CARE_PROVIDER_SITE_OTHER): Payer: Self-pay | Admitting: Thoracic Surgery (Cardiothoracic Vascular Surgery)

## 2022-05-02 ENCOUNTER — Ambulatory Visit
Admission: RE | Admit: 2022-05-02 | Discharge: 2022-05-02 | Disposition: A | Discharge status: Home / Self Care | Payer: Medicare Other | Source: Ambulatory Visit | Attending: Thoracic Surgery (Cardiothoracic Vascular Surgery) | Admitting: Thoracic Surgery (Cardiothoracic Vascular Surgery)

## 2022-05-02 VITALS — BP 120/70 | HR 72 | Resp 20 | Ht 67.0 in | Wt 171.0 lb

## 2022-05-02 DIAGNOSIS — C3490 Malignant neoplasm of unspecified part of unspecified bronchus or lung: Secondary | ICD-10-CM

## 2022-05-02 DIAGNOSIS — C3431 Malignant neoplasm of lower lobe, right bronchus or lung: Secondary | ICD-10-CM

## 2022-05-02 MED ORDER — GABAPENTIN 300 MG PO CAPS: 300.0000 mg | ORAL_CAPSULE | Freq: Three times a day (TID) | ORAL | 1 refills | Status: AC

## 2022-05-02 NOTE — Progress Notes (Signed)
[address]       [place] [address]             [phone]     HPI: [name] returns for a scheduled follow-up visit after recent basilar segmentectomy.  Vanessa Oneill is a 72 year old woman with a history of tobacco abuse, COPD, lung cancer, hypertension, hyperlipidemia, GI bleeding, pyelonephritis, sepsis, and diabetes.  A low-dose CT for lung cancer screening in July 2023 showed a small nodule in the right lower lobe and a cystic lesion in the left lower lobe.  Over time the right lower lobe nodule enlarged.  PET/CT showed both areas were hypermetabolic.  Given the change in size of the right lower lobe nodules felt to be of more immediate concern.  I did a right lower lobe basilar segmentectomy on 03/23/2022.  She went home on postoperative day 5.  Pathology showed a T1b, N0, stage IA squamous cell carcinoma.  I saw her in the office on 04/04/2022.  She was having a lot of pain issues was mostly using Tylenol.  Was taking gabapentin 100 mg in the morning 300 mg in the afternoon and 100 mg at night.  Also was using mostly Tylenol.  She is continue to have some pain issues.  That particularly bad last week after she mowed her yard.  Dr. Graciela Husbands increased her hydrocodone 5/325 to 3 times daily.  Past Medical History:  Diagnosis Date   Acid reflux    Anxiety    Aortic atherosclerosis (HCC)    Arthritis    Cerebral aneurysm    Colon adenomas    COPD (chronic obstructive pulmonary disease) (HCC)    Coronary artery disease    Depression    Diabetes mellitus without complication (HCC)    ESBL (extended spectrum beta-lactamase) producing bacteria infection    Headache    Hyperlipidemia    Hypertension    Liver disease    Lumbago    Mitral valve regurgitation    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HCC)    Personal history of tobacco use, presenting hazards to health 11/12/2014   Raynaud's disease    Sleep apnea     Current Outpatient Medications  Medication Sig  Dispense Refill   albuterol (VENTOLIN HFA) 108 (90 Base) MCG/ACT inhaler Inhale 1-2 puffs into the lungs every 6 (six) hours as needed for wheezing or shortness of breath.     ALPRAZolam (XANAX) 0.25 MG tablet Take 0.25 mg by mouth daily as needed for anxiety or sleep.  5   amLODipine (NORVASC) 2.5 MG tablet Take 2.5 mg by mouth daily.     busPIRone (BUSPAR) 7.5 MG tablet Take 7.5 mg by mouth 2 (two) times daily.     colestipol (COLESTID) 1 g tablet Take 1 g by mouth 2 (two) times daily.     cyclobenzaprine (FLEXERIL) 10 MG tablet Take 10 mg by mouth 3 (three) times daily as needed for muscle spasms.     glimepiride (AMARYL) 4 MG tablet Take 4 mg by mouth daily with breakfast.     HYDROcodone-acetaminophen (NORCO/VICODIN) 5-325 MG tablet Take 1 tablet by mouth every 8 (eight) hours as needed for moderate pain.      insulin NPH Human (HUMULIN N,NOVOLIN N) 100 UNIT/ML injection Inject 0.1 mLs (10 Units total) into the skin 2 (two) times daily before a meal. Use 30 units in the morning, 20 units during the day and 30 units at night. (Patient taking differently: Inject 30 Units into the  skin daily as needed (blood sugar of 160 or above).) 10 mL 11   ipratropium (ATROVENT) 0.06 % nasal spray Place 2 sprays into both nostrils in the morning and at bedtime.     metFORMIN (GLUCOPHAGE-XR) 500 MG 24 hr tablet Take 1,000 mg by mouth daily with supper.     NONFORMULARY OR COMPOUNDED ITEM Apply 1 Pump topically 3 (three) times daily as needed (neuropathy in feet). Baclofen 2%, Diclofenac 5%, Gabapentin 6%, Tetracaine 3%     omeprazole (PRILOSEC) 40 MG capsule Take 40 mg by mouth daily.     spironolactone (ALDACTONE) 25 MG tablet Take 25 mg by mouth daily.     traZODone (DESYREL) 50 MG tablet Take 50 mg by mouth at bedtime.  0   gabapentin (NEURONTIN) 300 MG capsule Take 1 capsule (300 mg total) by mouth 3 (three) times daily. Take 100 mg by mouth in the morning, 300 mg in the afternoon and 100 mg at bedtime. 270  capsule 1   No current facility-administered medications for this visit.    Physical Exam BP 120/70 (BP Location: Left Arm, Patient Position: Sitting, Cuff Size: Normal)   Pulse 72   Resp 20   Ht 5\' 7"  (1.702 m)   Wt 171 lb (77.6 kg)   SpO2 94% Comment: RA  BMI 26.60 kg/m  72 year old woman in no acute distress Alert and oriented x 3 with no focal deficits Lungs slightly diminished at right base but otherwise clear Incisions clean dry and intact Cardiac regular rate and rhythm  Diagnostic Tests: CHEST - 2 VIEW   COMPARISON:  Chest x-ray dated April 04, 2022.   FINDINGS: The heart size and mediastinal contours are within normal limits. Normal pulmonary vascularity. Similar postsurgical changes at the right lung base. No focal consolidation, pleural effusion, or pneumothorax. No acute osseous abnormality.   IMPRESSION: 1. No acute cardiopulmonary disease.     Electronically Signed   By: Obie Dredge M.D.   On: 05/02/2022 13:04 Personally reviewed the chest x-ray images.  Postop changes.  Impression: Vanessa Oneill is a 72 year old woman with a history of tobacco abuse, COPD, lung cancer, hypertension, hyperlipidemia, GI bleeding, pyelonephritis, sepsis, and diabetes.    Squamous cell carcinoma right lower lobe-clinical stage Ia.  No adjuvant therapy needed.  She did see Dr. Michae Kava who will do the primary follow-up.  Postoperative pain-currently on Tylenol, gabapentin, Flexeril, and hydrocodone.  She has been using the hydrocodone on a regular basis but says that it does not really do much for the pain.  Uses Flexeril occasionally at night.  Gabapentin 100/300/100.  My recommendation was that she use the Flexeril on a little more regular basis.  I also think we should increase her gabapentin to 300 mg 3 times a day.  Hopefully that will help ease off on the irritation of the nerve and will help the pain improved more rapidly.  Left lower lobe cystic nodule-Dr. Michae Kava  to order CT in near future  Plan: Increase gabapentin to 300 mg 3 times daily, prescribe 90-day supply Encouraged to use Tylenol and Flexeril on a more regular basis Continue hydrocodone as needed Follow-up with Dr. Michae Kava Return in 6 weeks to check on progress  Loreli Slot, MD [facility] [phone]

## 2022-05-08 ENCOUNTER — Ambulatory Visit: Payer: Medicare Other

## 2022-05-09 ENCOUNTER — Encounter: Payer: Self-pay | Admitting: Internal Medicine

## 2022-05-16 ENCOUNTER — Ambulatory Visit: Payer: Medicare Other | Admitting: Pulmonary Disease

## 2022-06-20 ENCOUNTER — Ambulatory Visit: Payer: Medicare Other | Admitting: Thoracic Surgery (Cardiothoracic Vascular Surgery)

## 2022-07-04 ENCOUNTER — Ambulatory Visit: Payer: Medicare Other | Admitting: Thoracic Surgery (Cardiothoracic Vascular Surgery)

## 2022-07-04 ENCOUNTER — Encounter: Payer: Self-pay | Admitting: Internal Medicine

## 2022-07-07 ENCOUNTER — Ambulatory Visit: Payer: Medicare Other | Admitting: Internal Medicine

## 2022-07-07 ENCOUNTER — Other Ambulatory Visit: Payer: Medicare Other

## 2022-07-10 ENCOUNTER — Ambulatory Visit: Payer: Medicare Other | Admitting: Internal Medicine

## 2022-07-12 ENCOUNTER — Ambulatory Visit
Admission: RE | Admit: 2022-07-12 | Discharge: 2022-07-12 | Disposition: A | Discharge status: Home / Self Care | Payer: Medicare Other | Source: Ambulatory Visit | Attending: Internal Medicine | Admitting: Internal Medicine

## 2022-07-12 ENCOUNTER — Encounter: Admission: RE | Payer: Self-pay | Source: Home / Self Care

## 2022-07-12 ENCOUNTER — Ambulatory Visit: Admission: RE | Admit: 2022-07-12 | Payer: Medicare Other | Source: Home / Self Care | Admitting: Internal Medicine

## 2022-07-12 DIAGNOSIS — C3431 Malignant neoplasm of lower lobe, right bronchus or lung: Secondary | ICD-10-CM | POA: Insufficient documentation

## 2022-07-12 HISTORY — DX: Portal hypertension: K76.6

## 2022-07-12 HISTORY — DX: Malignant (primary) neoplasm, unspecified: C80.1

## 2022-07-12 LAB — POCT I-STAT CREATININE: Creatinine, Ser: 1 mg/dL (ref 0.44–1.00)

## 2022-07-12 SURGERY — COLONOSCOPY
Anesthesia: General

## 2022-07-12 MED ORDER — IOHEXOL 300 MG/ML  SOLN
75.0000 mL | Freq: Once | INTRAMUSCULAR | Status: AC | PRN
  Administered 2022-07-12: 75 mL via INTRAVENOUS

## 2022-07-24 ENCOUNTER — Inpatient Hospital Stay: Payer: Medicare Other | Attending: Internal Medicine | Admitting: Internal Medicine

## 2022-07-24 VITALS — BP 101/72 | HR 64 | Temp 97.6°F | Wt 167.8 lb

## 2022-07-24 DIAGNOSIS — I10 Essential (primary) hypertension: Secondary | ICD-10-CM | POA: Diagnosis not present

## 2022-07-24 DIAGNOSIS — F17210 Nicotine dependence, cigarettes, uncomplicated: Secondary | ICD-10-CM | POA: Diagnosis not present

## 2022-07-24 DIAGNOSIS — J449 Chronic obstructive pulmonary disease, unspecified: Secondary | ICD-10-CM | POA: Diagnosis not present

## 2022-07-24 DIAGNOSIS — E1159 Type 2 diabetes mellitus with other circulatory complications: Secondary | ICD-10-CM | POA: Diagnosis not present

## 2022-07-24 DIAGNOSIS — F419 Anxiety disorder, unspecified: Secondary | ICD-10-CM | POA: Diagnosis not present

## 2022-07-24 DIAGNOSIS — C3431 Malignant neoplasm of lower lobe, right bronchus or lung: Secondary | ICD-10-CM

## 2022-07-24 DIAGNOSIS — K219 Gastro-esophageal reflux disease without esophagitis: Secondary | ICD-10-CM | POA: Insufficient documentation

## 2022-07-24 DIAGNOSIS — Z79899 Other long term (current) drug therapy: Secondary | ICD-10-CM | POA: Insufficient documentation

## 2022-07-24 DIAGNOSIS — K7460 Unspecified cirrhosis of liver: Secondary | ICD-10-CM | POA: Diagnosis not present

## 2022-07-24 DIAGNOSIS — Z801 Family history of malignant neoplasm of trachea, bronchus and lung: Secondary | ICD-10-CM | POA: Diagnosis not present

## 2022-07-24 DIAGNOSIS — R0609 Other forms of dyspnea: Secondary | ICD-10-CM | POA: Insufficient documentation

## 2022-07-24 DIAGNOSIS — F32A Depression, unspecified: Secondary | ICD-10-CM | POA: Insufficient documentation

## 2022-07-24 NOTE — Progress Notes (Signed)
[facility] CONSULT NOTE  Patient Care Team: Lynnea Ferrier, MD as PCP - General (Internal Medicine) Mertie Moores, MD as Referring Physician (Specialist)  REFERRING PROVIDER: Dr. 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  Right lower lobe nodule was measuring 5.6 mm.  She had repeat CT scan in 6 months and the lesion had grown to 11 mm.  - s/p right lower lobe lateral basilar segmentectomy with lymph node sampling by Dr. Dorris Fetch on 03/23/2022.  Pathology showed invasive well to moderately differentiated squamous cell cancer measuring 1.6 x 1.3 x 1 cm.  Margins negative, 0/9 lymph nodes negative.  Full report below.  No role for adjuvant treatment.  On surveillance.  -CT chest with contrast Reviewed from 07/12/2022 which showed stable precarinal lymph node 1.6 cm, similar mild mediastinal adenopathy light clear reactive.  Left lower lobe area of hypermetabolic some on prior PET corresponds to an area of stable focal cystic change which is favored to represent bulla or bleb.  Emphysematous changes.  Similar interstitial lung disease.  Cirrhosis.  Splenomegaly.  Pulmonary artery enlargement suggest PAH.  -Imaging was discussed with the patient.  Will continue with surveillance and repeat CT chest with  contrast in 6 months.  Per NCCN recommendation is CT chest monitoring every 6 months for 3 years and then annually.  # Small focus of hypermetabolic in rectum -Detected on PET/CT scan.   -s/p colonoscopy which showed 2 polyps.  One was tubular adenoma and one was hyperplastic polyp.  Endoscopy showed grade 2 varices status post banding.  Follow-up per GI Dr. Norma Fredrickson.  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  Follows with GI.  # Active smoker # Shortness of breath -We discussed about smoking cessation.  She started using nicotine gum and will see the response.  Offered to smoking cessation program and would like to hold off. -Patient reports shortness of breath on exertion. Patient was advised to discuss with her pulmonary Dr. Meredeth Ide or Dr. Graciela Husbands about the role of maintenance inhaler with evidence of emphysematous changes in her lung and been symptomatic.  Orders Placed This Encounter  Procedures   CT Chest W Contrast    Standing Status:   Future    Standing Expiration Date:   01/24/2023    Order Specific Question:   If indicated for the ordered procedure, I authorize the administration of contrast media per Radiology protocol    Answer:   Yes    Order Specific Question:   Does the patient have a contrast media/X-ray dye allergy?    Answer:   No    Order Specific Question:   Preferred imaging location?    Answer:   [facility]   RTC in 6 months for MD visit to discuss CT.  The total  time spent in the appointment was 30 minutes encounter with patients including review of chart and various tests results, discussions about plan of care and coordination of care plan   All questions were answered. The patient knows to call the clinic with any problems, questions or concerns. No barriers to learning was detected.  Michaelyn Barter, MD 7/22/20244:10 PM   HISTORY OF PRESENTING ILLNESS:  Vanessa Oneill 72 y.o. 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  Interval history Patient seen today as follow-up for stage I right lung squamous cell cancer on surveillance. Reports shortness of breath on exertion.  Her pain has improved.  Denies new concerns.  Continues to smoke.  Recently started nicotine gum.  I have reviewed her chart and materials related to her cancer extensively and collaborated history with the patient. 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  Other lung nodules were stable.  Follow-up in 6 months was recommended.  Repeat CT chest done on 01/25/2022 showed growing right lower lobe nodule to 11.6 mm.  Liver cirrhosis and splenomegaly.   02/13/2022 PET scan   PET scan  showed 11.6 mm right lower lobe pulmonary nodule with SUV of 2.5.  Low level hypermetabolic some associated with 2.5 cm peripheral subsolid lesion with multiple air cyst SUV of 3.2.  These may be infectious/inflammatory although low-grade neoplasm cannot be excluded.   03/23/2022 Definitive Surgery   S/p robotic assisted right lower lobe lateral basilar segmentectomy, lymph node sampling and intercostal nerve blocks levels 4 through 10 by Dr. Dorris Fetch   04/04/2022 Cancer Staging   Staging form: Lung, AJCC 8th Edition - Pathologic: Stage IA2 (pT1b, pN0, cM0) - Signed by Loreli Slot, MD on 04/04/2022    Pathology Results   A. RIGHT LUNG, LOWER LOBE, BRONCHIAL MARGIN, EXCISION: Negative for carcinoma, margin free  B. RIGHT LUNG, LOWER LOBE, LATERAL SEGMENT, NODULE, SEGMENTECTOMY: Invasive well to moderately differentiated squamous cell carcinoma Tumor measures 1.6 x 1.3 x 1.0 cm  (pT1b) Margins free  C. LYMPH NODE, LEVEL 9, EXCISION: Benign anthracotic stroma Negative for lymph node  D. LYMPH NODE, LEVEL 7, EXCISION: One benign lymph node with adjacent calcified nodule, negative for carcinoma (0/1)  E. LYMPH NODE, LEVEL 7 #2, EXCISION: One benign lymph node, negative for carcinoma (0/1)  F. LYMPH NODE, LEVEL 12, EXCISION: One benign lymph node, negative for carcinoma (0/1)  G. LYMPH NODE, LEVEL 12 #2, EXCISION: One benign lymph node, negative for carcinoma (0/1)  H. LYMPH NODE, LEVEL 13, EXCISION: One benign lymph node, negative for carcinoma (0/1)  I. LYMPH NODE, LEVEL 13 #2, EXCISION: One benign lymph node, negative for carcinoma (0/1)  J. LYMPH NODE, LEVEL 13 #3, EXCISION: Mucus with admixed benign bronchial cells and pulmonary macrophages  K. LYMPH NODE, 10R, EXCISION: One benign lymph node, negative for carcinoma (0/1)  L. LYMPH NODE, 4R, EXCISION: One benign lymph node, negative for carcinoma (0/1)   ONCOLOGY TABLE:  LUNG: Resection  Synchronous Tumors: Not applicable Total Number of Primary Tumors: 1 Procedure: Segmentectomy with lymph node sampling Specimen Laterality: Right Tumor Focality: Unifocal Tumor Site: Lower lobe, lateral segment Tumor Size: 1.6 x 1.3 x 1.0 cm Histologic Type: Squamous cell  carcinoma Visceral Pleura Invasion: Not identified Direct Invasion of Adjacent Structures: No adjacent structures present Lymphovascular Invasion: Not identified Margins: All margins negative for invasive carcinoma      Closest Margin(s) to Invasive Carcinoma: 0.8 cm from stapled parenchymal margin Treatment Effect: No known presurgical therapy Regional Lymph Nodes:      Number of Lymph Nodes Involved: 0      Number of Lymph Nodes Examined: 9                      Nodal Sites Examined: Stations 4R, 10R, 7, 12R and 13R      MEDICAL HISTORY:  Past Medical History:  Diagnosis Date   Acid reflux    Anxiety    Aortic  atherosclerosis (HCC)    Arthritis    Cancer (HCC)    sqamous cell carcinoma of right lung   Cerebral aneurysm    Colon adenomas    COPD (chronic obstructive pulmonary disease) (HCC)    Coronary artery disease    Depression    Diabetes mellitus without complication (HCC)    ESBL (extended spectrum beta-lactamase) producing bacteria infection    Headache    Hyperlipidemia    Hypertension    Liver disease    Lumbago    Mitral valve regurgitation    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HCC)    Personal history of tobacco use, presenting hazards to health 11/12/2014   Portal venous hypertension (HCC)    Raynaud's disease    Sleep apnea     SURGICAL HISTORY: Past Surgical History:  Procedure Laterality Date   ABDOMINAL HYSTERECTOMY     APPENDECTOMY     CHOLECYSTECTOMY     COLONOSCOPY WITH PROPOFOL N/A 11/18/2018   Procedure: COLONOSCOPY WITH PROPOFOL;  Surgeon: Toledo, Boykin Nearing, MD;  Location: [facility] ENDOSCOPY;  Service: Gastroenterology;  Laterality: N/A;   COLONOSCOPY WITH PROPOFOL N/A 04/26/2022   Procedure: COLONOSCOPY WITH PROPOFOL;  Surgeon: Toledo, Boykin Nearing, MD;  Location: [facility] ENDOSCOPY;  Service: Gastroenterology;  Laterality: N/A;   ESOPHAGOGASTRODUODENOSCOPY (EGD) WITH PROPOFOL N/A 07/24/2016   Procedure: ESOPHAGOGASTRODUODENOSCOPY (EGD) WITH PROPOFOL;  Surgeon: Wyline Mood, MD;  Location: [facility] ENDOSCOPY;  Service: Endoscopy;  Laterality: N/A;   ESOPHAGOGASTRODUODENOSCOPY (EGD) WITH PROPOFOL N/A 11/21/2016   Procedure: ESOPHAGOGASTRODUODENOSCOPY (EGD) WITH PROPOFOL;  Surgeon: Toledo, Boykin Nearing, MD;  Location: [facility] ENDOSCOPY;  Service: Gastroenterology;  Laterality: N/A;   ESOPHAGOGASTRODUODENOSCOPY (EGD) WITH PROPOFOL N/A 02/05/2018   Procedure: ESOPHAGOGASTRODUODENOSCOPY (EGD) WITH PROPOFOL;  Surgeon: Toledo, Boykin Nearing, MD;  Location: [facility] ENDOSCOPY;  Service: Endoscopy;  Laterality: N/A;   ESOPHAGOGASTRODUODENOSCOPY (EGD) WITH PROPOFOL N/A 04/26/2022   Procedure:  ESOPHAGOGASTRODUODENOSCOPY (EGD) WITH PROPOFOL;  Surgeon: Toledo, Boykin Nearing, MD;  Location: [facility] ENDOSCOPY;  Service: Gastroenterology;  Laterality: N/A;   INTERCOSTAL NERVE BLOCK  03/23/2022   Procedure: INTERCOSTAL NERVE BLOCK;  Surgeon: Loreli Slot, MD;  Location: [facility] OR;  Service: Thoracic;;   LYMPH NODE BIOPSY  03/23/2022   Procedure: LYMPH NODE BIOPSY;  Surgeon: Loreli Slot, MD;  Location: [facility] OR;  Service: Thoracic;;   TONSILLECTOMY     XI ROBOTIC ASSISTED THORACOSCOPY- SEGMENTECTOMY Right 03/23/2022   Procedure: XI ROBOTIC ASSISTED THORACOSCOPY-RIGHT LOWER LOBE LATERAL BASILAR SEGMENTECTOMY;  Surgeon: Loreli Slot, MD;  Location: [facility] OR;  Service: Thoracic;  Laterality: Right;    SOCIAL HISTORY: Social History   Socioeconomic History   Marital status: Widowed    Spouse name: Not on file   Number of children: 2   Years of education: Not  on file   Highest education level: Not on file  Occupational History   Not on file  Tobacco Use   Smoking status: Former    Current packs/day: 0.00    Average packs/day: 1 pack/day for 48.0 years (48.0 ttl pk-yrs)    Types: Cigarettes    Start date: 03/23/1974    Quit date: 03/23/2022    Years since quitting: 0.3   Smokeless tobacco: Never   Tobacco comments:    patient refused  Vaping Use   Vaping status: Never Used  Substance and Sexual Activity   Alcohol use: No    Alcohol/week: 0.0 standard drinks of alcohol   Drug use: No   Sexual activity: Not Currently  Other Topics Concern   Not on file  Social History Narrative   Live at home with husband   Social Determinants of Health   Financial Resource Strain: Low Risk  (02/06/2017)   Overall Financial Resource Strain (CARDIA)    Difficulty of Paying Living Expenses: Not hard at all  Food Insecurity: No Food Insecurity (04/07/2022)   Hunger Vital Sign    Worried About Running Out of Food in the Last Year: Never true    Ran Out of Food in the Last Year: Never  true  Transportation Needs: No Transportation Needs (03/24/2022)   PRAPARE - Administrator, Civil Service (Medical): No    Lack of Transportation (Non-Medical): No  Physical Activity: Inactive (02/06/2017)   Exercise Vital Sign    Days of Exercise per Week: 0 days    Minutes of Exercise per Session: 0 min  Stress: Stress Concern Present (02/06/2017)   [employer] of Occupational Health - Occupational Stress Questionnaire    Feeling of Stress : To some extent  Social Connections: Moderately Integrated (02/06/2017)   Social Connection and Isolation Panel [NHANES]    Frequency of Communication with Friends and Family: More than three times a week    Frequency of Social Gatherings with Friends and Family: Once a week    Attends Religious Services: More than 4 times per year    Active Member of [employer] or Organizations: No    Attends Banker Meetings: Never    Marital Status: Married  Catering manager Violence: Not At Risk (03/24/2022)   Humiliation, Afraid, Rape, and Kick questionnaire    Fear of Current or Ex-Partner: No    Emotionally Abused: No    Physically Abused: No    Sexually Abused: No    FAMILY HISTORY: Family History  Problem Relation Age of Onset   Lung cancer Father    Heart Problems Father    Heart failure Mother    Breast cancer Neg Hx     ALLERGIES:  is allergic to codeine, tylenol with codeine #3 [acetaminophen-codeine], amoxicillin, lipitor [atorvastatin], lisinopril, and penicillins.  MEDICATIONS:  Current Outpatient Medications  Medication Sig Dispense Refill   albuterol (VENTOLIN HFA) 108 (90 Base) MCG/ACT inhaler Inhale 1-2 puffs into the lungs every 6 (six) hours as needed for wheezing or shortness of breath.     ALPRAZolam (XANAX) 0.25 MG tablet Take 0.25 mg by mouth daily as needed for anxiety or sleep.  5   amLODipine (NORVASC) 2.5 MG tablet Take 2.5 mg by mouth daily.     busPIRone (BUSPAR) 7.5 MG tablet Take 7.5 mg by mouth 2  (two) times daily.     colestipol (COLESTID) 1 g tablet Take 1 g by mouth 2 (two) times daily.  cyclobenzaprine (FLEXERIL) 10 MG tablet Take 10 mg by mouth 3 (three) times daily as needed for muscle spasms.     gabapentin (NEURONTIN) 300 MG capsule Take 1 capsule (300 mg total) by mouth 3 (three) times daily. Take 100 mg by mouth in the morning, 300 mg in the afternoon and 100 mg at bedtime. 270 capsule 1   glimepiride (AMARYL) 4 MG tablet Take 4 mg by mouth daily with breakfast.     HYDROcodone-acetaminophen (NORCO/VICODIN) 5-325 MG tablet Take 1 tablet by mouth every 8 (eight) hours as needed for moderate pain.      insulin NPH Human (HUMULIN N,NOVOLIN N) 100 UNIT/ML injection Inject 0.1 mLs (10 Units total) into the skin 2 (two) times daily before a meal. Use 30 units in the morning, 20 units during the day and 30 units at night. (Patient taking differently: Inject 30 Units into the skin daily as needed (blood sugar of 160 or above).) 10 mL 11   ipratropium (ATROVENT) 0.06 % nasal spray Place 2 sprays into both nostrils in the morning and at bedtime.     metFORMIN (GLUCOPHAGE-XR) 500 MG 24 hr tablet Take 1,000 mg by mouth daily with supper.     nadolol (CORGARD) 80 MG tablet Take 80 mg by mouth daily.     NONFORMULARY OR COMPOUNDED ITEM Apply 1 Pump topically 3 (three) times daily as needed (neuropathy in feet). Baclofen 2%, Diclofenac 5%, Gabapentin 6%, Tetracaine 3%     omeprazole (PRILOSEC) 40 MG capsule Take 40 mg by mouth daily.     spironolactone (ALDACTONE) 25 MG tablet Take 25 mg by mouth daily.     traZODone (DESYREL) 50 MG tablet Take 50 mg by mouth at bedtime.  0   No current facility-administered medications for this visit.    REVIEW OF SYSTEMS:   Pertinent information mentioned in HPI All other systems were reviewed with the patient and are negative.  PHYSICAL EXAMINATION: ECOG PERFORMANCE STATUS: 1 - Symptomatic but completely ambulatory  Vitals:   07/24/22 1513  BP:  101/72  Pulse: 64  Temp: 97.6 F (36.4 C)  SpO2: 97%   Filed Weights   07/24/22 1513  Weight: 167 lb 12.8 oz (76.1 kg)    GENERAL:alert, no distress and comfortable SKIN: skin color, texture, turgor are normal, no rashes or significant lesions EYES: normal, conjunctiva are pink and non-injected, sclera clear OROPHARYNX:no exudate, no erythema and lips, buccal mucosa, and tongue normal  NECK: supple, thyroid normal size, non-tender, without nodularity LYMPH:  no palpable lymphadenopathy in the cervical, axillary or inguinal LUNGS: clear to auscultation and percussion with normal breathing effort HEART: regular rate & rhythm and no murmurs and no lower extremity edema ABDOMEN:abdomen soft, non-tender and normal bowel sounds Musculoskeletal:no cyanosis of digits and no clubbing  PSYCH: alert & oriented x 3 with fluent speech NEURO: no focal motor/sensory deficits  LABORATORY DATA:  I have reviewed the data as listed Lab Results  Component Value Date   WBC 5.1 03/28/2022   HGB 11.1 (L) 03/28/2022   HCT 32.7 (L) 03/28/2022   MCV 91.3 03/28/2022   PLT 81 (L) 03/28/2022   Recent Labs    03/21/22 1330 03/24/22 0019 03/25/22 0012 03/27/22 0026 07/12/22 1352  NA 131* 125* 129* 129*  --   K 4.3 4.3 4.2 4.9  --   CL 104 100 102 100  --   CO2 18* 17* 22 22  --   GLUCOSE 285* 377* 118* 54*  --  BUN 12 16 17 23   --   CREATININE 0.97 1.04* 0.89 0.98 1.00  CALCIUM 8.7* 7.8* 7.7* 8.5*  --   GFRNONAA >60 58* >60 >60  --   PROT 6.7  --  5.3*  --   --   ALBUMIN 3.5  --  2.7*  --   --   AST 29  --  32  --   --   ALT 16  --  22  --   --   ALKPHOS 118  --  75  --   --   BILITOT 0.9  --  0.9  --   --     RADIOGRAPHIC STUDIES: I have personally reviewed the radiological images as listed and agreed with the findings in the report. CT Chest W Contrast  Result Date: 07/18/2022 CLINICAL DATA:  Lung cancer diagnosed in January. Right lower lobe segmentectomy in March. Asymptomatic.  Smoker. * Tracking Code: BO * EXAM: CT CHEST WITH CONTRAST TECHNIQUE: Multidetector CT imaging of the chest was performed during intravenous contrast administration. RADIATION DOSE REDUCTION: This exam was performed according to the departmental dose-optimization program which includes automated exposure control, adjustment of the mA and/or kV according to patient size and/or use of iterative reconstruction technique. CONTRAST:  75mL OMNIPAQUE IOHEXOL 300 MG/ML  SOLN COMPARISON:  Chest radiographs most recent 05/02/2022. PET 02/13/2022. Chest CT of 01/25/2022 FINDINGS: Cardiovascular: Aortic atherosclerosis. Tortuous thoracic aorta. Normal heart size, without pericardial effusion. Three vessel coronary artery calcification. No central pulmonary embolism, on this non-dedicated study. Pulmonary artery enlargement, outflow tract 3.1 cm. Mediastinum/Nodes: No supraclavicular adenopathy. Precarinal node measures 1.6 cm on 59/2 and is similar. Other mediastinal nodes are calcified and likely related to old granulomatous disease. No hilar adenopathy. Mildly dilated esophagus with fluid level within. Trace lower mediastinal fluid likely tracks through the esophageal hiatus and is not significantly changed. Lungs/Pleura: No pleural fluid. Moderate paraseptal and centrilobular emphysema. Basilar predominant subpleural ground-glass and reticulation again identified. No well-defined honeycombing. Anterolateral right lower lobe wedge resection including on 98/3, without residual or locally recurrent disease. Scattered calcified granulomas. The area of hypermetabolism within the anterolateral left lower lobe appears similar on CT. Localized cystic change which is favored to represent a bulla/bleb. No well-defined solid components. On the order of maximally 3.1 cm today on 83/3, similar. Upper Abdomen: Advanced cirrhosis. Incompletely imaged splenomegaly. Normal imaged portions of the stomach, pancreas, adrenal glands, right  kidney. Musculoskeletal: No acute osseous abnormality. IMPRESSION: 1. Interval right lower lobe wedge resection, without local recurrence. 2. Similar mild mediastinal adenopathy, likely reactive given presence back to 05/13/2019. 3. The left lower lobe area of hypermetabolism on prior PET corresponds to an area of stable focal cystic change which is favored to represent a bulla or bleb. No developing nodularity. Recommend attention on follow-up. 4. Similar interstitial lung disease, possibly nonspecific interstitial pneumonitis 5. Cirrhosis with small volume abdominal ascites and splenomegaly. 6. Aortic atherosclerosis (ICD10-I70.0), coronary artery atherosclerosis and emphysema (ICD10-J43.9). 7. Pulmonary artery enlargement suggests pulmonary arterial hypertension. 8. Esophageal air fluid level suggests dysmotility or gastroesophageal reflux. Electronically Signed   By: Jeronimo Greaves M.D.   On: 07/18/2022 08:38

## 2022-07-24 NOTE — Progress Notes (Signed)
Patient has been having shortness of breath and feeling more weak lately.

## 2022-12-15 DIAGNOSIS — I700 Atherosclerosis of aorta: Secondary | ICD-10-CM | POA: Diagnosis not present

## 2022-12-15 DIAGNOSIS — E118 Type 2 diabetes mellitus with unspecified complications: Secondary | ICD-10-CM | POA: Diagnosis not present

## 2022-12-15 DIAGNOSIS — Z79891 Long term (current) use of opiate analgesic: Secondary | ICD-10-CM | POA: Diagnosis not present

## 2022-12-15 DIAGNOSIS — J438 Other emphysema: Secondary | ICD-10-CM | POA: Diagnosis not present

## 2022-12-15 DIAGNOSIS — I2510 Atherosclerotic heart disease of native coronary artery without angina pectoris: Secondary | ICD-10-CM | POA: Diagnosis not present

## 2022-12-15 DIAGNOSIS — E7849 Other hyperlipidemia: Secondary | ICD-10-CM | POA: Diagnosis not present

## 2022-12-18 DIAGNOSIS — E1151 Type 2 diabetes mellitus with diabetic peripheral angiopathy without gangrene: Secondary | ICD-10-CM | POA: Diagnosis not present

## 2022-12-18 DIAGNOSIS — I700 Atherosclerosis of aorta: Secondary | ICD-10-CM | POA: Diagnosis not present

## 2022-12-18 DIAGNOSIS — G720 Drug-induced myopathy: Secondary | ICD-10-CM | POA: Diagnosis not present

## 2022-12-18 DIAGNOSIS — K7460 Unspecified cirrhosis of liver: Secondary | ICD-10-CM | POA: Diagnosis not present

## 2022-12-18 DIAGNOSIS — F329 Major depressive disorder, single episode, unspecified: Secondary | ICD-10-CM | POA: Diagnosis not present

## 2022-12-18 DIAGNOSIS — D696 Thrombocytopenia, unspecified: Secondary | ICD-10-CM | POA: Diagnosis not present

## 2022-12-18 DIAGNOSIS — Z87891 Personal history of nicotine dependence: Secondary | ICD-10-CM | POA: Diagnosis not present

## 2022-12-18 DIAGNOSIS — J449 Chronic obstructive pulmonary disease, unspecified: Secondary | ICD-10-CM | POA: Diagnosis not present

## 2022-12-18 DIAGNOSIS — I10 Essential (primary) hypertension: Secondary | ICD-10-CM | POA: Diagnosis not present

## 2023-01-18 ENCOUNTER — Ambulatory Visit
Admission: RE | Admit: 2023-01-18 | Discharge: 2023-01-18 | Disposition: A | Discharge status: Home / Self Care | Payer: Medicare HMO | Source: Ambulatory Visit | Attending: Internal Medicine | Admitting: Internal Medicine

## 2023-01-18 DIAGNOSIS — C3490 Malignant neoplasm of unspecified part of unspecified bronchus or lung: Secondary | ICD-10-CM | POA: Diagnosis not present

## 2023-01-18 DIAGNOSIS — J432 Centrilobular emphysema: Secondary | ICD-10-CM | POA: Diagnosis not present

## 2023-01-18 DIAGNOSIS — C3431 Malignant neoplasm of lower lobe, right bronchus or lung: Secondary | ICD-10-CM | POA: Insufficient documentation

## 2023-01-18 DIAGNOSIS — E042 Nontoxic multinodular goiter: Secondary | ICD-10-CM | POA: Diagnosis not present

## 2023-01-18 DIAGNOSIS — J8410 Pulmonary fibrosis, unspecified: Secondary | ICD-10-CM | POA: Diagnosis not present

## 2023-01-18 MED ORDER — IOHEXOL 300 MG/ML  SOLN
75.0000 mL | Freq: Once | INTRAMUSCULAR | Status: AC | PRN
  Administered 2023-01-18: 75 mL via INTRAVENOUS

## 2023-01-19 ENCOUNTER — Other Ambulatory Visit: Payer: Self-pay | Admitting: Thoracic Surgery (Cardiothoracic Vascular Surgery)

## 2023-01-19 ENCOUNTER — Other Ambulatory Visit: Payer: Self-pay | Admitting: Physician Assistant

## 2023-01-25 ENCOUNTER — Inpatient Hospital Stay: Payer: Medicare HMO | Admitting: Internal Medicine

## 2023-01-26 ENCOUNTER — Encounter: Payer: Self-pay | Admitting: Internal Medicine

## 2023-01-26 ENCOUNTER — Inpatient Hospital Stay: Payer: Medicare HMO | Attending: Internal Medicine | Admitting: Internal Medicine

## 2023-01-26 VITALS — BP 137/73 | HR 72 | Temp 97.8°F | Resp 16 | Wt 180.0 lb

## 2023-01-26 DIAGNOSIS — Z79899 Other long term (current) drug therapy: Secondary | ICD-10-CM | POA: Insufficient documentation

## 2023-01-26 DIAGNOSIS — R161 Splenomegaly, not elsewhere classified: Secondary | ICD-10-CM | POA: Diagnosis not present

## 2023-01-26 DIAGNOSIS — E118 Type 2 diabetes mellitus with unspecified complications: Secondary | ICD-10-CM | POA: Diagnosis not present

## 2023-01-26 DIAGNOSIS — F5101 Primary insomnia: Secondary | ICD-10-CM | POA: Diagnosis not present

## 2023-01-26 DIAGNOSIS — C3431 Malignant neoplasm of lower lobe, right bronchus or lung: Secondary | ICD-10-CM | POA: Diagnosis not present

## 2023-01-26 DIAGNOSIS — E119 Type 2 diabetes mellitus without complications: Secondary | ICD-10-CM | POA: Diagnosis not present

## 2023-01-26 DIAGNOSIS — F17200 Nicotine dependence, unspecified, uncomplicated: Secondary | ICD-10-CM | POA: Diagnosis not present

## 2023-01-26 DIAGNOSIS — Z860100 Personal history of colon polyps, unspecified: Secondary | ICD-10-CM | POA: Insufficient documentation

## 2023-01-26 DIAGNOSIS — K219 Gastro-esophageal reflux disease without esophagitis: Secondary | ICD-10-CM | POA: Insufficient documentation

## 2023-01-26 NOTE — Progress Notes (Signed)
[facility] CONSULT NOTE  Patient Care Team: Lynnea Ferrier, MD as PCP - General (Internal Medicine) Mertie Moores, MD as Referring Physician (Specialist) Michaelyn Barter, MD as Consulting Physician (Oncology)  REFERRING PROVIDER: Dr. 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  Right lower lobe nodule was measuring 5.6 mm.  She had repeat CT scan in 6 months and the lesion had grown to 11 mm.  - s/p right lower lobe lateral basilar segmentectomy with lymph node sampling by Dr. Dorris Fetch on 03/23/2022.  Pathology showed invasive well to moderately differentiated squamous cell cancer measuring 1.6 x 1.3 x 1 cm.  Margins negative, 0/9 lymph nodes negative.  Full report below.  No role for adjuvant treatment.  On surveillance.  -CT chest with contrast from 01/18/2023 shows stable mediastinal lymph node.  Atypical pulmonary cyst in the anterolateral left lower lobe with wall thickening new from 07/12/2022.  1.5 cm left thyroid nodule.  Recommend thyroid ultrasound.  Cirrhosis with portal hypertension.  Enlarged pulmonary trunk indicating of PAH.  Emphysema.  -I will add patient to tumor conference for next Thursday for radiology review only for the new pulmonary cyst.  Will reach out to the patient after  discussion.  For now, we will schedule her for repeat CT chest in 6 months.  -Imaging was discussed with the patient.  Will continue with surveillance and repeat CT chest with contrast in 6 months.  Per NCCN recommendation is CT chest monitoring every 6 months for 3 years and then annually.  # Small focus of hypermetabolic in rectum -Detected on PET/CT scan.   -s/p colonoscopy which showed 2 polyps.  One was tubular adenoma and one was hyperplastic polyp.  Endoscopy showed grade 2 varices status post banding.  Follow-up per GI Dr. Norma Fredrickson.  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  Follows with GI.  # Active smoker # COPD -Reports worsening of shortness of breath.  Continues to decline although has cut down.  Has declined smoking cessation program in the past. -Patient was advised to schedule follow-up with Dr. Meredeth Ide, pulmonary.  Orders Placed This Encounter  Procedures   CT Chest W Contrast    Standing Status:   Future    Expected Date:   07/26/2023    Expiration Date:   01/26/2024    If indicated for the ordered procedure, I authorize the administration of contrast media per Radiology protocol:   Yes    Does the patient have a contrast media/X-ray dye allergy?:   No    Preferred imaging location?:   [facility]   US THYROID    Patient prefers afternoons    Standing Status:   Future    Expected Date:   02/09/2023    Expiration Date:   01/26/2024  Reason for Exam (SYMPTOM  OR DIAGNOSIS REQUIRED):   left thyroid nodule seen on the CT scan    Preferred imaging location?:   [facility]   RTC in 6 months for MD visit to discuss CT.  The total time spent in the appointment was 30 minutes encounter with patients including review of chart and various tests results, discussions about plan of care and coordination of care plan   All questions were answered. The patient knows to call the clinic with any problems,  questions or concerns. No barriers to learning was detected.  Michaelyn Barter, MD 1/24/20253:53 PM   HISTORY OF PRESENTING ILLNESS:  Vanessa Oneill 73 y.o. 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  Interval history Patient seen today as follow-up for stage I right lung squamous cell cancer on surveillance. Reports worsening shortness of breath on exertion.  Her pain has improved.  Denies new concerns.  Continues to smoke.   I have reviewed her chart and materials related to her cancer extensively and collaborated history with the patient. 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  Other lung nodules were stable.  Follow-up in 6 months was recommended.  Repeat CT chest done on 01/25/2022 showed growing right lower lobe nodule to 11.6 mm.  Liver cirrhosis and splenomegaly.   02/13/2022 PET scan   PET scan  showed 11.6 mm right lower lobe pulmonary nodule with SUV of 2.5.  Low level hypermetabolic some associated with 2.5 cm peripheral subsolid lesion with multiple air cyst SUV of 3.2.  These may be infectious/inflammatory although low-grade neoplasm cannot be excluded.   03/23/2022 Definitive Surgery   S/p robotic assisted right lower lobe lateral basilar segmentectomy, lymph node sampling and intercostal nerve blocks levels 4 through 10 by Dr. Dorris Fetch   04/04/2022 Cancer Staging   Staging form: Lung, AJCC 8th Edition - Pathologic: Stage IA2 (pT1b, pN0, cM0) - Signed by Loreli Slot, MD on 04/04/2022    Pathology Results   A. RIGHT LUNG, LOWER LOBE, BRONCHIAL MARGIN, EXCISION: Negative for carcinoma, margin free  B. RIGHT LUNG, LOWER  LOBE, LATERAL SEGMENT, NODULE, SEGMENTECTOMY: Invasive well to moderately differentiated squamous cell carcinoma Tumor measures 1.6 x 1.3 x 1.0 cm (pT1b) Margins free  C. LYMPH NODE, LEVEL 9, EXCISION: Benign anthracotic stroma Negative for lymph node  D. LYMPH NODE, LEVEL 7, EXCISION: One benign lymph node with adjacent calcified nodule, negative for carcinoma (0/1)  E. LYMPH NODE, LEVEL 7 #2, EXCISION: One benign lymph node, negative for carcinoma (0/1)  F. LYMPH NODE, LEVEL 12, EXCISION: One benign lymph node, negative for carcinoma (0/1)  G. LYMPH NODE, LEVEL 12 #2, EXCISION: One benign lymph node, negative for carcinoma (0/1)  H. LYMPH NODE, LEVEL 13, EXCISION: One benign lymph node, negative for carcinoma (0/1)  I. LYMPH NODE, LEVEL 13 #2, EXCISION: One benign lymph node, negative for carcinoma (0/1)  J. LYMPH NODE, LEVEL 13 #3, EXCISION: Mucus with admixed benign bronchial cells and pulmonary macrophages  K. LYMPH NODE, 10R, EXCISION: One benign lymph node, negative for carcinoma (0/1)  L. LYMPH NODE, 4R, EXCISION: One benign lymph node, negative for carcinoma (0/1)   ONCOLOGY TABLE:  LUNG: Resection  Synchronous Tumors: Not applicable Total Number of Primary Tumors: 1 Procedure: Segmentectomy with lymph node sampling Specimen Laterality: Right Tumor Focality: Unifocal Tumor Site: Lower lobe, lateral segment Tumor Size: 1.6 x 1.3 x 1.0 cm Histologic Type: Squamous cell carcinoma Visceral Pleura Invasion: Not identified Direct Invasion of Adjacent Structures: No adjacent structures present Lymphovascular Invasion: Not identified Margins: All margins negative for invasive carcinoma      Closest Margin(s) to Invasive Carcinoma: 0.8 cm from stapled parenchymal margin Treatment Effect: No known presurgical therapy Regional Lymph Nodes:      Number of Lymph Nodes Involved: 0      Number of Lymph Nodes Examined: 9                      Nodal Sites Examined:  Stations 4R, 10R, 7, 12R and 13R      MEDICAL HISTORY:  Past Medical History:  Diagnosis Date   Acid reflux    Anxiety    Aortic atherosclerosis (HCC)    Arthritis    Cancer (HCC)    sqamous cell carcinoma of right lung   Cerebral aneurysm    Colon adenomas    COPD (chronic obstructive pulmonary disease) (HCC)    Coronary artery disease    Depression    Diabetes mellitus without complication (HCC)    ESBL (extended spectrum beta-lactamase) producing bacteria infection    Headache    Hyperlipidemia    Hypertension    Liver disease    Lumbago    Mitral valve regurgitation    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HCC)    Personal history of tobacco use, presenting hazards to health 11/12/2014   Portal venous hypertension (HCC)    Raynaud's disease    Sleep apnea     SURGICAL HISTORY: Past Surgical History:  Procedure Laterality Date   ABDOMINAL HYSTERECTOMY     APPENDECTOMY     CHOLECYSTECTOMY     COLONOSCOPY WITH PROPOFOL N/A 11/18/2018   Procedure: COLONOSCOPY WITH PROPOFOL;  Surgeon: Toledo, Boykin Nearing, MD;  Location: [facility] ENDOSCOPY;  Service: Gastroenterology;  Laterality: N/A;   COLONOSCOPY WITH PROPOFOL N/A 04/26/2022   Procedure: COLONOSCOPY WITH PROPOFOL;  Surgeon: Toledo, Boykin Nearing, MD;  Location: [facility] ENDOSCOPY;  Service: Gastroenterology;  Laterality: N/A;   ESOPHAGOGASTRODUODENOSCOPY (EGD) WITH PROPOFOL N/A 07/24/2016   Procedure: ESOPHAGOGASTRODUODENOSCOPY (EGD) WITH PROPOFOL;  Surgeon: Wyline Mood, MD;  Location: [facility] ENDOSCOPY;  Service: Endoscopy;  Laterality: N/A;   ESOPHAGOGASTRODUODENOSCOPY (EGD) WITH PROPOFOL N/A 11/21/2016   Procedure: ESOPHAGOGASTRODUODENOSCOPY (EGD) WITH PROPOFOL;  Surgeon: Toledo, Boykin Nearing, MD;  Location: [facility] ENDOSCOPY;  Service: Gastroenterology;  Laterality: N/A;   ESOPHAGOGASTRODUODENOSCOPY (EGD) WITH PROPOFOL N/A 02/05/2018   Procedure: ESOPHAGOGASTRODUODENOSCOPY (EGD) WITH PROPOFOL;  Surgeon: Toledo, Boykin Nearing, MD;  Location: [facility] ENDOSCOPY;   Service: Endoscopy;  Laterality: N/A;   ESOPHAGOGASTRODUODENOSCOPY (EGD) WITH PROPOFOL N/A 04/26/2022   Procedure: ESOPHAGOGASTRODUODENOSCOPY (EGD) WITH PROPOFOL;  Surgeon: Toledo, Boykin Nearing, MD;  Location: [facility] ENDOSCOPY;  Service: Gastroenterology;  Laterality: N/A;   INTERCOSTAL NERVE BLOCK  03/23/2022   Procedure: INTERCOSTAL NERVE BLOCK;  Surgeon: Loreli Slot, MD;  Location: [facility] OR;  Service: Thoracic;;   LYMPH NODE BIOPSY  03/23/2022   Procedure: LYMPH NODE BIOPSY;  Surgeon: Loreli Slot, MD;  Location: [facility] OR;  Service: Thoracic;;   TONSILLECTOMY     XI ROBOTIC ASSISTED THORACOSCOPY- SEGMENTECTOMY Right 03/23/2022   Procedure: XI ROBOTIC ASSISTED THORACOSCOPY-RIGHT LOWER LOBE LATERAL BASILAR SEGMENTECTOMY;  Surgeon: Loreli Slot, MD;  Location: [facility] OR;  Service:  Thoracic;  Laterality: Right;    SOCIAL HISTORY: Social History   Socioeconomic History   Marital status: Widowed    Spouse name: Not on file   Number of children: 2   Years of education: Not on file   Highest education level: Not on file  Occupational History   Not on file  Tobacco Use   Smoking status: Former    Current packs/day: 0.00    Average packs/day: 1 pack/day for 48.0 years (48.0 ttl pk-yrs)    Types: Cigarettes    Start date: 03/23/1974    Quit date: 03/23/2022    Years since quitting: 0.8   Smokeless tobacco: Never   Tobacco comments:    patient refused  Vaping Use   Vaping status: Never Used  Substance and Sexual Activity   Alcohol use: No    Alcohol/week: 0.0 standard drinks of alcohol   Drug use: No   Sexual activity: Not Currently  Other Topics Concern   Not on file  Social History Narrative   Live at home with husband   Social Drivers of Corporate investment banker Strain: Low Risk  (09/13/2022)   Received from [facility]   Overall Financial Resource Strain (CARDIA)    Difficulty of Paying Living Expenses: Not hard at all  Food Insecurity: No  Food Insecurity (09/13/2022)   Received [facility]   Hunger Vital Sign    Worried About Running Out of Food in the Last Year: Never true    Ran Out of Food in the Last Year: Never true  Transportation Needs: No Transportation Needs (09/13/2022)   Received from [facility] - Transportation    In the past 12 months, has lack of transportation kept you from medical appointments or from getting medications?: No    Lack of Transportation (Non-Medical): No  Physical Activity: Inactive (02/06/2017)   Exercise Vital Sign    Days of Exercise per Week: 0 days    Minutes of Exercise per Session: 0 min  Stress: Stress Concern Present (02/06/2017)   [employer] of Occupational Health - Occupational Stress Questionnaire    Feeling of Stress : To some extent  Social Connections: Moderately Integrated (02/06/2017)   Social Connection and Isolation Panel [NHANES]    Frequency of Communication with Friends and Family: More than three times a week    Frequency of Social Gatherings with Friends and Family: Once a week    Attends Religious Services: More than 4 times per year    Active Member of [employer] or Organizations: No    Attends Banker Meetings: Never    Marital Status: Married  Catering manager Violence: Not At Risk (03/24/2022)   Humiliation, Afraid, Rape, and Kick questionnaire    Fear of Current or Ex-Partner: No    Emotionally Abused: No    Physically Abused: No    Sexually Abused: No    FAMILY HISTORY: Family History  Problem Relation Age of Onset   Lung cancer Father    Heart Problems Father    Heart failure Mother    Breast cancer Neg Hx     ALLERGIES:  is allergic to codeine, tylenol with codeine #3 [acetaminophen-codeine], amoxicillin, lipitor [atorvastatin], lisinopril, and penicillins.  MEDICATIONS:  Current Outpatient Medications  Medication Sig Dispense Refill   albuterol (VENTOLIN HFA) 108 (90 Base) MCG/ACT  inhaler Inhale 1-2 puffs into the lungs every 6 (six) hours as needed for wheezing or shortness  of breath.     ALPRAZolam (XANAX) 0.25 MG tablet Take 0.25 mg by mouth daily as needed for anxiety or sleep.  5   amLODipine (NORVASC) 2.5 MG tablet Take 2.5 mg by mouth daily.     busPIRone (BUSPAR) 7.5 MG tablet Take 7.5 mg by mouth 2 (two) times daily.     colestipol (COLESTID) 1 g tablet Take 1 g by mouth 2 (two) times daily.     cyclobenzaprine (FLEXERIL) 10 MG tablet Take 10 mg by mouth 3 (three) times daily as needed for muscle spasms.     gabapentin (NEURONTIN) 300 MG capsule Take 1 capsule (300 mg total) by mouth 3 (three) times daily. Take 100 mg by mouth in the morning, 300 mg in the afternoon and 100 mg at bedtime. 270 capsule 1   glimepiride (AMARYL) 4 MG tablet Take 4 mg by mouth daily with breakfast.     HYDROcodone-acetaminophen (NORCO/VICODIN) 5-325 MG tablet Take 1 tablet by mouth every 8 (eight) hours as needed for moderate pain.      insulin NPH Human (HUMULIN N,NOVOLIN N) 100 UNIT/ML injection Inject 0.1 mLs (10 Units total) into the skin 2 (two) times daily before a meal. Use 30 units in the morning, 20 units during the day and 30 units at night. (Patient taking differently: Inject 30 Units into the skin daily as needed (blood sugar of 160 or above).) 10 mL 11   ipratropium (ATROVENT) 0.06 % nasal spray Place 2 sprays into both nostrils in the morning and at bedtime.     metFORMIN (GLUCOPHAGE-XR) 500 MG 24 hr tablet Take 500 mg by mouth daily with supper. 3 times a day     nadolol (CORGARD) 80 MG tablet Take 80 mg by mouth daily.     NONFORMULARY OR COMPOUNDED ITEM Apply 1 Pump topically 3 (three) times daily as needed (neuropathy in feet). Baclofen 2%, Diclofenac 5%, Gabapentin 6%, Tetracaine 3%     omeprazole (PRILOSEC) 40 MG capsule Take 40 mg by mouth daily.     spironolactone (ALDACTONE) 25 MG tablet Take 25 mg by mouth daily.     traZODone (DESYREL) 50 MG tablet Take 50 mg by  mouth at bedtime.  0   No current facility-administered medications for this visit.    REVIEW OF SYSTEMS:   Pertinent information mentioned in HPI All other systems were reviewed with the patient and are negative.  PHYSICAL EXAMINATION: ECOG PERFORMANCE STATUS: 1 - Symptomatic but completely ambulatory  Vitals:   01/26/23 1443  BP: 137/73  Pulse: 72  Resp: 16  Temp: 97.8 F (36.6 C)  SpO2: 96%   Filed Weights   01/26/23 1443  Weight: 180 lb (81.6 kg)    GENERAL:alert, no distress and comfortable SKIN: skin color, texture, turgor are normal, no rashes or significant lesions EYES: normal, conjunctiva are pink and non-injected, sclera clear OROPHARYNX:no exudate, no erythema and lips, buccal mucosa, and tongue normal  NECK: supple, thyroid normal size, non-tender, without nodularity LYMPH:  no palpable lymphadenopathy in the cervical, axillary or inguinal LUNGS: clear to auscultation and percussion with normal breathing effort HEART: regular rate & rhythm and no murmurs and no lower extremity edema ABDOMEN:abdomen soft, non-tender and normal bowel sounds Musculoskeletal:no cyanosis of digits and no clubbing  PSYCH: alert & oriented x 3 with fluent speech NEURO: no focal motor/sensory deficits  LABORATORY DATA:  I have reviewed the data as listed Lab Results  Component Value Date   WBC 5.1 03/28/2022   HGB  11.1 (L) 03/28/2022   HCT 32.7 (L) 03/28/2022   MCV 91.3 03/28/2022   PLT 81 (L) 03/28/2022   Recent Labs    03/21/22 1330 03/24/22 0019 03/25/22 0012 03/27/22 0026 07/12/22 1352  NA 131* 125* 129* 129*  --   K 4.3 4.3 4.2 4.9  --   CL 104 100 102 100  --   CO2 18* 17* 22 22  --   GLUCOSE 285* 377* 118* 54*  --   BUN 12 16 17 23   --   CREATININE 0.97 1.04* 0.89 0.98 1.00  CALCIUM 8.7* 7.8* 7.7* 8.5*  --   GFRNONAA >60 58* >60 >60  --   PROT 6.7  --  5.3*  --   --   ALBUMIN 3.5  --  2.7*  --   --   AST 29  --  32  --   --   ALT 16  --  22  --   --    ALKPHOS 118  --  75  --   --   BILITOT 0.9  --  0.9  --   --     RADIOGRAPHIC STUDIES: I have personally reviewed the radiological images as listed and agreed with the findings in the report. CT Chest W Contrast Result Date: 01/24/2023 CLINICAL DATA:  Non-small cell lung cancer.  * Tracking Code: BO * EXAM: CT CHEST WITH CONTRAST TECHNIQUE: Multidetector CT imaging of the chest was performed during intravenous contrast administration. RADIATION DOSE REDUCTION: This exam was performed according to the departmental dose-optimization program which includes automated exposure control, adjustment of the mA and/or kV according to patient size and/or use of iterative reconstruction technique. CONTRAST:  75mL OMNIPAQUE IOHEXOL 300 MG/ML  SOLN COMPARISON:  07/12/2022. FINDINGS: Cardiovascular: Atherosclerotic calcification of the aorta, aortic valve and coronary arteries. Enlarged pulmonic trunk and heart. No pericardial effusion. Mediastinum/Nodes: Thyroid nodules measure up to 1.5 cm on the left. Mediastinal lymph nodes measure up to 1.3 cm in the low right paratracheal station, unchanged. Calcified mediastinal and hilar lymph nodes. No axillary adenopathy. Esophagus is grossly unremarkable. Ascitic fluid adjacent to the distal esophagus, unchanged. Lungs/Pleura: Centrilobular and paraseptal emphysema. Superimposed subpleural reticular densities, ground-glass and bronchiolectasis, without a clear craniocaudal gradient. Atypical pulmonary cyst in the anterolateral left lower lobe (4/76) has associated wall thickening, the latter of which is new from 07/12/2022. Right lower lobe wedge resection. No pleural fluid. Debris in the airway. Upper Abdomen: Liver margin is irregular. Probable small splenic cyst. Spleen appears enlarged but is incompletely imaged. Gastroesophageal varices. Small ascites. Visualized portions of the liver, adrenal glands, right kidney, spleen, pancreas, stomach and bowel are otherwise grossly  unremarkable. No upper abdominal adenopathy. Musculoskeletal: Degenerative changes in the spine. IMPRESSION: 1. Atypical pulmonary cyst in the left lower lobe with new wall thickening. Recommend continued attention on follow-up or with CT chest without contrast in 6 months, as clinically indicated, as malignancy cannot be excluded. 2. 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 3. 1.5 cm left thyroid nodule. Recommend thyroid ultrasound. (Ref: J Am Coll Radiol. 2015 Feb;12(2): 143-50). 4. Cirrhosis with portal hypertension. 5. Small ascites. 6. Aortic atherosclerosis (ICD10-I70.0). Coronary artery calcification. 7. Enlarged pulmonic trunk, indicative of pulmonary arterial hypertension. 8.  Emphysema (ICD10-J43.9). Electronically Signed   By: Leanna Battles M.D.   On: 01/24/2023 16:27

## 2023-01-26 NOTE — Progress Notes (Signed)
Patient is still having some shortness of breath, and it has worsened.

## 2023-01-30 ENCOUNTER — Ambulatory Visit
Admission: RE | Admit: 2023-01-30 | Discharge: 2023-01-30 | Disposition: A | Discharge status: Home / Self Care | Payer: Medicare HMO | Source: Ambulatory Visit | Attending: Internal Medicine | Admitting: Internal Medicine

## 2023-01-30 DIAGNOSIS — R399 Unspecified symptoms and signs involving the genitourinary system: Secondary | ICD-10-CM | POA: Diagnosis not present

## 2023-01-30 DIAGNOSIS — C3431 Malignant neoplasm of lower lobe, right bronchus or lung: Secondary | ICD-10-CM | POA: Insufficient documentation

## 2023-01-30 DIAGNOSIS — E041 Nontoxic single thyroid nodule: Secondary | ICD-10-CM | POA: Diagnosis not present

## 2023-02-01 ENCOUNTER — Other Ambulatory Visit: Payer: Medicare HMO

## 2023-02-02 ENCOUNTER — Other Ambulatory Visit: Payer: Self-pay | Admitting: Physician Assistant

## 2023-02-05 ENCOUNTER — Telehealth: Payer: Self-pay | Admitting: *Deleted

## 2023-02-05 DIAGNOSIS — C3431 Malignant neoplasm of lower lobe, right bronchus or lung: Secondary | ICD-10-CM

## 2023-02-05 DIAGNOSIS — E049 Nontoxic goiter, unspecified: Secondary | ICD-10-CM

## 2023-02-05 NOTE — Telephone Encounter (Signed)
-----   Message from Michaelyn Barter sent at 02/05/2023 12:56 PM EST ----- On ultrasound thyroid, we do not see nodules concerning for malignancy.  However, radiologist makes note that there are multiple nodules in the thyroid suggestive of multinodular goiter.  I do not manage this and unable to provide further recommendations.  If you are agreeable, we can make referral to endocrinologist.  thank you ----- Message ----- From: Interface, Rad Results In Sent: 02/03/2023   6:31 PM EST To: Michaelyn Barter, MD

## 2023-02-05 NOTE — Telephone Encounter (Signed)
Spoke with patient. Reviewed results and recommendations for endocrinology referral. Pt agreeable.  Referral placed to [facility].

## 2023-02-05 NOTE — Telephone Encounter (Signed)
Patient called and said that she was supposed to be getting a call and it was Thursday or Friday and she has not heard anything.  She would like to have either the nurse or the doctor to give her a call. It was a bout her lung and ct scan. Vanessa Oneill called her already and pt aware of her care

## 2023-02-13 DIAGNOSIS — H00025 Hordeolum internum left lower eyelid: Secondary | ICD-10-CM | POA: Diagnosis not present

## 2023-02-28 DIAGNOSIS — Z720 Tobacco use: Secondary | ICD-10-CM | POA: Diagnosis not present

## 2023-02-28 DIAGNOSIS — D696 Thrombocytopenia, unspecified: Secondary | ICD-10-CM | POA: Diagnosis not present

## 2023-02-28 DIAGNOSIS — Z794 Long term (current) use of insulin: Secondary | ICD-10-CM | POA: Diagnosis not present

## 2023-02-28 DIAGNOSIS — I8510 Secondary esophageal varices without bleeding: Secondary | ICD-10-CM | POA: Diagnosis not present

## 2023-02-28 DIAGNOSIS — K7460 Unspecified cirrhosis of liver: Secondary | ICD-10-CM | POA: Diagnosis not present

## 2023-02-28 DIAGNOSIS — K766 Portal hypertension: Secondary | ICD-10-CM | POA: Diagnosis not present

## 2023-02-28 DIAGNOSIS — C3431 Malignant neoplasm of lower lobe, right bronchus or lung: Secondary | ICD-10-CM | POA: Diagnosis not present

## 2023-02-28 DIAGNOSIS — J449 Chronic obstructive pulmonary disease, unspecified: Secondary | ICD-10-CM | POA: Diagnosis not present

## 2023-02-28 DIAGNOSIS — E119 Type 2 diabetes mellitus without complications: Secondary | ICD-10-CM | POA: Diagnosis not present

## 2023-02-28 DIAGNOSIS — M542 Cervicalgia: Secondary | ICD-10-CM | POA: Diagnosis not present

## 2023-03-06 DIAGNOSIS — Z794 Long term (current) use of insulin: Secondary | ICD-10-CM | POA: Diagnosis not present

## 2023-03-06 DIAGNOSIS — E785 Hyperlipidemia, unspecified: Secondary | ICD-10-CM | POA: Diagnosis not present

## 2023-03-06 DIAGNOSIS — E1159 Type 2 diabetes mellitus with other circulatory complications: Secondary | ICD-10-CM | POA: Diagnosis not present

## 2023-03-06 DIAGNOSIS — E1165 Type 2 diabetes mellitus with hyperglycemia: Secondary | ICD-10-CM | POA: Diagnosis not present

## 2023-03-06 DIAGNOSIS — E1169 Type 2 diabetes mellitus with other specified complication: Secondary | ICD-10-CM | POA: Diagnosis not present

## 2023-03-06 DIAGNOSIS — I152 Hypertension secondary to endocrine disorders: Secondary | ICD-10-CM | POA: Diagnosis not present

## 2023-03-06 DIAGNOSIS — E1140 Type 2 diabetes mellitus with diabetic neuropathy, unspecified: Secondary | ICD-10-CM | POA: Diagnosis not present

## 2023-03-06 DIAGNOSIS — E042 Nontoxic multinodular goiter: Secondary | ICD-10-CM | POA: Diagnosis not present

## 2023-03-12 ENCOUNTER — Other Ambulatory Visit: Payer: Self-pay | Admitting: Internal Medicine

## 2023-03-12 DIAGNOSIS — M5000 Cervical disc disorder with myelopathy, unspecified cervical region: Secondary | ICD-10-CM

## 2023-03-13 DIAGNOSIS — D696 Thrombocytopenia, unspecified: Secondary | ICD-10-CM | POA: Diagnosis not present

## 2023-03-13 DIAGNOSIS — E785 Hyperlipidemia, unspecified: Secondary | ICD-10-CM | POA: Diagnosis not present

## 2023-03-13 DIAGNOSIS — E118 Type 2 diabetes mellitus with unspecified complications: Secondary | ICD-10-CM | POA: Diagnosis not present

## 2023-03-13 DIAGNOSIS — Z79891 Long term (current) use of opiate analgesic: Secondary | ICD-10-CM | POA: Diagnosis not present

## 2023-03-15 ENCOUNTER — Ambulatory Visit
Admission: RE | Admit: 2023-03-15 | Discharge: 2023-03-15 | Disposition: A | Discharge status: Home / Self Care | Source: Ambulatory Visit | Attending: Internal Medicine | Admitting: Internal Medicine

## 2023-03-15 DIAGNOSIS — M4312 Spondylolisthesis, cervical region: Secondary | ICD-10-CM | POA: Diagnosis not present

## 2023-03-15 DIAGNOSIS — M4802 Spinal stenosis, cervical region: Secondary | ICD-10-CM | POA: Diagnosis not present

## 2023-03-15 DIAGNOSIS — M5000 Cervical disc disorder with myelopathy, unspecified cervical region: Secondary | ICD-10-CM | POA: Insufficient documentation

## 2023-03-15 DIAGNOSIS — M47812 Spondylosis without myelopathy or radiculopathy, cervical region: Secondary | ICD-10-CM | POA: Diagnosis not present

## 2023-03-15 DIAGNOSIS — M542 Cervicalgia: Secondary | ICD-10-CM | POA: Diagnosis not present

## 2023-03-28 DIAGNOSIS — E118 Type 2 diabetes mellitus with unspecified complications: Secondary | ICD-10-CM | POA: Diagnosis not present

## 2023-04-03 DIAGNOSIS — J449 Chronic obstructive pulmonary disease, unspecified: Secondary | ICD-10-CM | POA: Diagnosis not present

## 2023-04-03 DIAGNOSIS — E1140 Type 2 diabetes mellitus with diabetic neuropathy, unspecified: Secondary | ICD-10-CM | POA: Diagnosis not present

## 2023-04-03 DIAGNOSIS — I152 Hypertension secondary to endocrine disorders: Secondary | ICD-10-CM | POA: Diagnosis not present

## 2023-04-03 DIAGNOSIS — K7460 Unspecified cirrhosis of liver: Secondary | ICD-10-CM | POA: Diagnosis not present

## 2023-04-03 DIAGNOSIS — E1159 Type 2 diabetes mellitus with other circulatory complications: Secondary | ICD-10-CM | POA: Diagnosis not present

## 2023-04-03 DIAGNOSIS — I10 Essential (primary) hypertension: Secondary | ICD-10-CM | POA: Diagnosis not present

## 2023-04-03 DIAGNOSIS — E785 Hyperlipidemia, unspecified: Secondary | ICD-10-CM | POA: Diagnosis not present

## 2023-04-03 DIAGNOSIS — E1165 Type 2 diabetes mellitus with hyperglycemia: Secondary | ICD-10-CM | POA: Diagnosis not present

## 2023-04-03 DIAGNOSIS — E119 Type 2 diabetes mellitus without complications: Secondary | ICD-10-CM | POA: Diagnosis not present

## 2023-04-03 DIAGNOSIS — E1169 Type 2 diabetes mellitus with other specified complication: Secondary | ICD-10-CM | POA: Diagnosis not present

## 2023-04-03 DIAGNOSIS — D696 Thrombocytopenia, unspecified: Secondary | ICD-10-CM | POA: Diagnosis not present

## 2023-04-03 DIAGNOSIS — F418 Other specified anxiety disorders: Secondary | ICD-10-CM | POA: Diagnosis not present

## 2023-04-03 DIAGNOSIS — Z794 Long term (current) use of insulin: Secondary | ICD-10-CM | POA: Diagnosis not present

## 2023-04-03 DIAGNOSIS — E042 Nontoxic multinodular goiter: Secondary | ICD-10-CM | POA: Diagnosis not present

## 2023-04-03 DIAGNOSIS — Z79891 Long term (current) use of opiate analgesic: Secondary | ICD-10-CM | POA: Diagnosis not present

## 2023-04-24 DIAGNOSIS — M542 Cervicalgia: Secondary | ICD-10-CM | POA: Diagnosis not present

## 2023-04-24 DIAGNOSIS — M5412 Radiculopathy, cervical region: Secondary | ICD-10-CM | POA: Diagnosis not present

## 2023-04-25 ENCOUNTER — Other Ambulatory Visit: Payer: Self-pay | Admitting: Physical Medicine & Rehabilitation

## 2023-04-25 DIAGNOSIS — M5412 Radiculopathy, cervical region: Secondary | ICD-10-CM

## 2023-05-08 DIAGNOSIS — R0602 Shortness of breath: Secondary | ICD-10-CM | POA: Diagnosis not present

## 2023-05-08 DIAGNOSIS — R531 Weakness: Secondary | ICD-10-CM | POA: Diagnosis not present

## 2023-05-08 DIAGNOSIS — R296 Repeated falls: Secondary | ICD-10-CM | POA: Diagnosis not present

## 2023-05-08 DIAGNOSIS — R350 Frequency of micturition: Secondary | ICD-10-CM | POA: Diagnosis not present

## 2023-05-08 DIAGNOSIS — E119 Type 2 diabetes mellitus without complications: Secondary | ICD-10-CM | POA: Diagnosis not present

## 2023-05-24 DIAGNOSIS — R251 Tremor, unspecified: Secondary | ICD-10-CM | POA: Diagnosis not present

## 2023-05-24 DIAGNOSIS — F418 Other specified anxiety disorders: Secondary | ICD-10-CM | POA: Diagnosis not present

## 2023-05-24 DIAGNOSIS — G959 Disease of spinal cord, unspecified: Secondary | ICD-10-CM | POA: Diagnosis not present

## 2023-05-24 DIAGNOSIS — E119 Type 2 diabetes mellitus without complications: Secondary | ICD-10-CM | POA: Diagnosis not present

## 2023-05-24 DIAGNOSIS — I10 Essential (primary) hypertension: Secondary | ICD-10-CM | POA: Diagnosis not present

## 2023-05-24 DIAGNOSIS — D696 Thrombocytopenia, unspecified: Secondary | ICD-10-CM | POA: Diagnosis not present

## 2023-05-24 DIAGNOSIS — M545 Low back pain, unspecified: Secondary | ICD-10-CM | POA: Diagnosis not present

## 2023-05-24 DIAGNOSIS — J449 Chronic obstructive pulmonary disease, unspecified: Secondary | ICD-10-CM | POA: Diagnosis not present

## 2023-05-25 ENCOUNTER — Other Ambulatory Visit: Payer: Self-pay | Admitting: Internal Medicine

## 2023-05-25 DIAGNOSIS — G959 Disease of spinal cord, unspecified: Secondary | ICD-10-CM

## 2023-05-30 ENCOUNTER — Ambulatory Visit: Admission: RE | Admit: 2023-05-30 | Source: Ambulatory Visit

## 2023-05-30 ENCOUNTER — Ambulatory Visit

## 2023-05-31 ENCOUNTER — Ambulatory Visit
Admission: RE | Admit: 2023-05-31 | Discharge: 2023-05-31 | Disposition: A | Discharge status: Home / Self Care | Source: Ambulatory Visit | Attending: Internal Medicine | Admitting: Internal Medicine

## 2023-05-31 DIAGNOSIS — M5136 Other intervertebral disc degeneration, lumbar region with discogenic back pain only: Secondary | ICD-10-CM | POA: Diagnosis not present

## 2023-05-31 DIAGNOSIS — M4316 Spondylolisthesis, lumbar region: Secondary | ICD-10-CM | POA: Diagnosis not present

## 2023-05-31 DIAGNOSIS — G959 Disease of spinal cord, unspecified: Secondary | ICD-10-CM | POA: Diagnosis not present

## 2023-06-28 DIAGNOSIS — E118 Type 2 diabetes mellitus with unspecified complications: Secondary | ICD-10-CM | POA: Diagnosis not present

## 2023-07-03 ENCOUNTER — Encounter: Payer: Self-pay | Admitting: Student in an Organized Health Care Education/Training Program

## 2023-07-03 ENCOUNTER — Ambulatory Visit
Attending: Student in an Organized Health Care Education/Training Program | Admitting: Student in an Organized Health Care Education/Training Program

## 2023-07-03 VITALS — BP 139/87 | HR 75 | Temp 97.1°F | Resp 17 | Ht 67.0 in | Wt 170.0 lb

## 2023-07-03 DIAGNOSIS — M5136 Other intervertebral disc degeneration, lumbar region with discogenic back pain only: Secondary | ICD-10-CM | POA: Diagnosis not present

## 2023-07-03 DIAGNOSIS — G894 Chronic pain syndrome: Secondary | ICD-10-CM | POA: Diagnosis not present

## 2023-07-03 DIAGNOSIS — M47816 Spondylosis without myelopathy or radiculopathy, lumbar region: Secondary | ICD-10-CM | POA: Diagnosis not present

## 2023-07-03 DIAGNOSIS — E1140 Type 2 diabetes mellitus with diabetic neuropathy, unspecified: Secondary | ICD-10-CM | POA: Diagnosis not present

## 2023-07-03 DIAGNOSIS — M5481 Occipital neuralgia: Secondary | ICD-10-CM | POA: Diagnosis not present

## 2023-07-03 DIAGNOSIS — Z794 Long term (current) use of insulin: Secondary | ICD-10-CM | POA: Diagnosis not present

## 2023-07-03 NOTE — Progress Notes (Signed)
 Safety precautions to be maintained throughout the outpatient stay will include: orient to surroundings, keep bed in low position, maintain call bell within reach at all times, provide assistance with transfer out of bed and ambulation.

## 2023-07-03 NOTE — Progress Notes (Signed)
 PROVIDER NOTE: Interpretation of information contained herein should be left to medically-trained personnel. Specific patient instructions are provided elsewhere under Patient Instructions section of medical record. This document was created in part using AI and STT-dictation technology, any transcriptional errors that may result from this process are unintentional.  Patient: Vanessa Oneill  Service: E/M Encounter  Provider: Wallie Sherry, MD  DOB: [birth year]  Delivery: Face-to-face  Specialty: Interventional Pain Management  MRN: [id number]  Setting: Ambulatory outpatient facility  Specialty designation: 09  Type: New Patient  Location: Outpatient office facility  PCP: Fernande Ophelia JINNY DOUGLAS, MD  DOS: 07/03/2023    Referring Prov.: Dodson Delon FERNS, MD   Primary Reason(s) for Visit: Encounter for initial evaluation of one or more chronic problems (new to examiner) potentially causing chronic pain, and posing a threat to normal musculoskeletal function. (Level of risk: High) CC: Back Pain (low)  HPI  Vanessa Oneill is a 73 y.o. year old, female patient, who comes for the first time to our practice referred by Dodson Delon I, MD for our initial evaluation of her chronic pain. She has Personal history of tobacco use, presenting hazards to health; GIB (gastrointestinal bleeding); Sepsis (HCC); Septic shock (HCC); Pyelonephritis; Respiratory distress; UTI (urinary tract infection); Fever; HTN (hypertension); HLD (hyperlipidemia); Diabetes (HCC); COPD (chronic obstructive pulmonary disease) (HCC); 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HCC); Right lower lobe pulmonary nodule; 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); Degeneration of intervertebral disc of lumbar region with discogenic back pain; Chronic pain syndrome; Chronic painful diabetic neuropathy (HCC); and Occipital neuralgia of right side on their problem list. Today she comes in for evaluation of her Back Pain (low)  Pain Assessment: Location:  Lower Back Radiating: to right hip Onset: More than a month ago Duration: Chronic pain Quality: Aching, Sharp, Shooting Severity: 7 /10 (subjective, self-reported pain score)  Effect on ADL: hard to get up from sitting position Timing: Constant Modifying factors: nothing BP: 139/87  HR: 75  Onset and Duration: Present longer than 3 months Cause of pain: Unknown Severity: NAS-11 at its worse: 10/10, NAS-11 at its best: 6/10, NAS-11 now: 8/10, and NAS-11 on the average: 9/10 Timing: Not influenced by the time of the day Aggravating Factors: none noted Alleviating Factors: none noted Associated Problems: Dizziness, Fatigue, Numbness, Spasms, Pain that wakes patient up, and Pain that does not allow patient to sleep Quality of Pain: Nagging, Pulsating, Throbbing, and Uncomfortable Previous Examinations or Tests: MRI scan Previous Treatments: none noted  Vanessa Oneill is being evaluated for possible interventional pain management therapies for the treatment of her chronic pain.   Discussed the use of AI scribe software for clinical note transcription with the patient, who gave verbal consent to proceed.  History of Present Illness   Vanessa Oneill is a 73 year old female with chronic low back pain who presents for pain management consultation. She was referred by Dr. Fernande for pain management.  She has experienced low back pain for many years, describing it as a sensation of her heartbeat in the middle of her lower back, which takes her breath away. The pain is exacerbated by prolonged sitting and standing, and she sometimes has difficulty walking. The pain radiates into her buttocks and down the back of her legs, presenting as a constant, nagging discomfort. Previous imaging studies, including an MRI, have shown arthritis in her lower back, particularly at L3, L4, and L5, as well as a slight disc bulge and a 3mm shift at L4 and L5.  She has not undergone any surgeries for her back pain.  She is currently taking hydrocodone , at a dose of 5 mg, with instructions to take two per day, although she sometimes takes one and a half when the pain is severe.  She experiences burning and tingling in her feet, which she attributes to her type 2 diabetes. She is currently taking gabapentin  for this condition, and her toes are particularly affected by these symptoms.  She mentions having neck pain that radiates upwards, causing headaches and waking her up at night. She describes the pain as 'hurting straight up' and affecting her ability to lay still.       Historic Controlled Substance Pharmacotherapy Review PMP and historical list of controlled substances:  07/02/2023 05/07/2023  1 Hydrocodone -Acetamin 5-325 Mg 60.00 20 [place] [id number] [facility] ([id number]) 0/0 15.00 MME Medicare [place]  06/05/2023 05/07/2023  1 Hydrocodone -Acetamin 5-325 Mg 60.00 20 [place] [id number] [facility] ([id number]) 0/0 15.00 MME Medicare [place]  05/08/2023 05/07/2023  1 Hydrocodone -Acetamin 5-325 Mg 60.00 20 [place] [id number] [facility] ([id number]) 0/0 15.00 MME Medicare [place]  Hydrocodone  5 mg twice daily as needed, MME equals 15 Historical Monitoring: The patient  reports no history of drug use. List of prior UDS Testing: No results found for: MDMA, COCAINSCRNUR, PCPSCRNUR, PCPQUANT, CANNABQUANT, THCU, ETH, CBDTHCR, D8THCCBX, D9THCCBX Historical Background Evaluation: [place] PMP: PDMP reviewed during this encounter. Review of the past 39-months conducted.              [place] Department of public safety, offender search: Engineer, mining Information) Non-contributory Risk Assessment Profile: Aberrant behavior: None observed or detected today Risk factors for fatal opioid overdose: None identified today Fatal overdose hazard ratio (HR): Calculation deferred Non-fatal overdose hazard ratio (HR): Calculation deferred Risk of opioid abuse or dependence: 0.7-3.0% with doses <= 36 MME/day and 6.1-26% with doses >= 120 MME/day. Substance use disorder (SUD) risk  level: See below Personal History of Substance Abuse (SUD-Substance use disorder):  Alcohol: Negative  Illegal Drugs: Negative  Rx Drugs: Negative  ORT Risk Level calculation: Moderate Risk  Opioid Risk Tool - 07/03/23 1347       Family History of Substance Abuse   Alcohol Positive Female    Illegal Drugs Negative    Rx Drugs Positive Female or Female   daughter, now deceased     Personal History of Substance Abuse   Alcohol Negative    Illegal Drugs Negative    Rx Drugs Negative      Age   Age between 40-45 years  No      Psychological Disease   Psychological Disease Negative    Depression Negative      Total Score   Opioid Risk Tool Scoring 5    Opioid Risk Interpretation Moderate Risk         ORT Scoring interpretation table:  Score <3 = Low Risk for SUD  Score between 4-7 = Moderate Risk for SUD  Score >8 = High Risk for Opioid Abuse   PHQ-2 Depression Scale:  Total score: 0  PHQ-2 Scoring interpretation table: (Score and probability of major depressive disorder)  Score 0 = No depression  Score 1 = 15.4% Probability  Score 2 = 21.1% Probability  Score 3 = 38.4% Probability  Score 4 = 45.5% Probability  Score 5 = 56.4% Probability  Score 6 = 78.6% Probability   PHQ-9 Depression Scale:  Total score: 0  PHQ-9 Scoring interpretation table:  Score 0-4 = No depression  Score 5-9 = Mild  depression  Score 10-14 = Moderate depression  Score 15-19 = Moderately severe depression  Score 20-27 = Severe depression (2.4 times higher risk of SUD and 2.89 times higher risk of overuse)   Pharmacologic Plan: As per protocol, I have not taken over any controlled substance management, pending the results of ordered tests and/or consults.            Initial impression: Pending review of available data and ordered tests.  Meds   Current Outpatient Medications:    albuterol  (VENTOLIN  HFA) 108 (90 Base) MCG/ACT inhaler, Inhale 1-2 puffs into the lungs every 6 (six) hours as  needed for wheezing or shortness of breath., Disp: , Rfl:    amLODipine (NORVASC) 2.5 MG tablet, Take 2.5 mg by mouth daily., Disp: , Rfl:    cyclobenzaprine  (FLEXERIL ) 10 MG tablet, Take 10 mg by mouth 3 (three) times daily as needed for muscle spasms. (Patient taking differently: Take 4 mg by mouth as needed for muscle spasms.), Disp: , Rfl:    gabapentin  (NEURONTIN ) 300 MG capsule, Take 1 capsule (300 mg total) by mouth 3 (three) times daily. Take 100 mg by mouth in the morning, 300 mg in the afternoon and 100 mg at bedtime., Disp: 270 capsule, Rfl: 1   glimepiride  (AMARYL ) 4 MG tablet, Take 4 mg by mouth daily with breakfast., Disp: , Rfl:    HYDROcodone -acetaminophen  (NORCO/VICODIN) 5-325 MG tablet, Take 1 tablet by mouth every 8 (eight) hours as needed for moderate pain. , Disp: , Rfl:    insulin  NPH Human (HUMULIN N,NOVOLIN N) 100 UNIT/ML injection, Inject 0.1 mLs (10 Units total) into the skin 2 (two) times daily before a meal. Use 30 units in the morning, 20 units during the day and 30 units at night. (Patient taking differently: Inject 30 Units into the skin daily as needed (blood sugar of 160 or above).), Disp: 10 mL, Rfl: 11   ipratropium (ATROVENT ) 0.06 % nasal spray, Place 2 sprays into both nostrils in the morning and at bedtime., Disp: , Rfl:    metFORMIN  (GLUCOPHAGE -XR) 500 MG 24 hr tablet, Take 500 mg by mouth daily with supper. 3 times a day, Disp: , Rfl:    nadolol  (CORGARD ) 80 MG tablet, Take 80 mg by mouth daily., Disp: , Rfl:    NONFORMULARY OR COMPOUNDED ITEM, Apply 1 Pump topically 3 (three) times daily as needed (neuropathy in feet). Baclofen 2%, Diclofenac 5%, Gabapentin  6%, Tetracaine 3%, Disp: , Rfl:    omeprazole (PRILOSEC) 40 MG capsule, Take 40 mg by mouth daily., Disp: , Rfl:    traZODone  (DESYREL ) 50 MG tablet, Take 50 mg by mouth at bedtime., Disp: , Rfl: 0  Imaging Review  Cervical Imaging: Cervical MR wo contrast: Results for orders placed during the hospital  encounter of 03/15/23  MR CERVICAL SPINE WO CONTRAST  Narrative CLINICAL DATA:  Cervicalgia  EXAM: MRI CERVICAL SPINE WITHOUT CONTRAST  TECHNIQUE: Multiplanar, multisequence MR imaging of the cervical spine was performed. No intravenous contrast was administered.  COMPARISON:  None Available.  FINDINGS: Alignment: Grade 1 anterolisthesis of C3 on C4.  Vertebrae: Vertebral bodies demonstrate normal signal intensity. No acute fracture is identified.  Cord: Normal signal and morphology.  Posterior Fossa, vertebral arteries, paraspinal tissues: The visualized portions of the skull base and the posterior fossa are normal. No soft tissue abnormality is identified.  Disc levels:  C2-C3: The disk is normal in configuration. No facet arthropathy. No uncovertebral joint disease. No neuroforaminal stenosis. No spinal canal  stenosis.  C3-C4: Disc osteophyte complex. Moderate left facet arthropathy. No uncovertebral joint disease. No neuroforaminal stenosis. No spinal canal stenosis.  C4-C5: Disc osteophyte complex. No facet arthropathy. No uncovertebral joint disease. No neuroforaminal stenosis. Mild spinal canal stenosis.  C5-C6: Disc osteophyte complex. No facet arthropathy. No uncovertebral joint disease. No neuroforaminal stenosis. Mild spinal canal stenosis.  C6-C7: Disc osteophyte complex. Mild bilateral facet arthropathy. No uncovertebral joint disease. No neuroforaminal stenosis. Mild spinal canal stenosis.  C7-T1: The disk is normal in configuration. No facet arthropathy. No uncovertebral joint disease. No neuroforaminal stenosis. No spinal canal stenosis.  IMPRESSION: Mild canal stenoses C4-C5, C5-C6, and C6-C7 secondary to disc osteophyte complexes. No significant foraminal stenosis.   Electronically Signed By: Clem Savory M.D. On: 04/11/2023 09:36    Narrative FINDINGS CLINICAL DATA:  NECK PAIN AND LEFT SHOULDER PAIN AND LEFT FIFTH FINGER  NUMBNESS. CERVICAL MYELOGRAM WITH CT: A LUMBAR PUNCTURE WAS PERFORMED AT L3-4 WITH A 22 GAUGE SPINAL NEEDLE USING STANDARD STERILE TECHNIQUE.  THERE IS RETURN OF CLEAR CSF.  10 CC OF OMNIPAQUE  300 WAS INJECTED INTO SUBARACHNOID SPACE WITH THE NECK EXTENDED. THE CONTRAST WAS ALLOWED TO FLOW INTO THE  CERVICAL SPINE.  THE CERVICAL MYELOGRAM REVEALS NO SIGNIFICANT EXTRADURAL DEFECTS.  THE NERVE ROOT FILLS OUT NORMALLY. CT SCANNING WAS THEN PERFORMED. THE CONTRAST LEVEL ON THE CT SCAN IS DECREASED.  THIS MAY BE DUE TO MILD DELAY IN OBTAINING THE CT SCAN FOLLOWING THE MYELOGRAM AS WELL WITH THE PATIENT SITTING UP AFTER THE MYELOGRAM.  I DID RESCAN THE PATIENT AFTER PLACING HER PRONE AND THIS IMPROVED THE CONTRAST LEVEL IN THE SUBARACHNOID SPACE C3-4:  NEGATIVE. C4-5:  NEGATIVE. C5-6:  A SMALL CENTRAL SPUR IS SEEN.  NO DISC HERNIATION IS SEEN.  THE MRI FROM [facility] IN 12/98 SHOW A CENTRAL DISC HERNIATION WHICH APPEARS TO HAVE IMPROVED AND POSSIBLY BECOME CALCIFIED. C6-7: NEGATIVE. IMPRESSION THE CERVICAL MYELOGRAM APPEARS NORMAL. THE CT SCAN HAS DIMINISHED CONTRAST DUE TO TECHNICAL FACTORS AND THE PATIENT SITTING UP.  NO DISC HERNIATION OR SPINAL STENOSIS IS SEEN.  THERE IS A SMALL CENTRAL SPUR AT C5-6 WHICH MAY BE RELATED TO A DISC HERNIATION SEEN ON THE MRI FROM 12/98.   DG Myelogram Cervical  Narrative FINDINGS CLINICAL DATA:  NECK PAIN AND LEFT SHOULDER PAIN AND LEFT FIFTH FINGER NUMBNESS. CERVICAL MYELOGRAM WITH CT: A LUMBAR PUNCTURE WAS PERFORMED AT L3-4 WITH A 22 GAUGE SPINAL NEEDLE USING STANDARD STERILE TECHNIQUE.  THERE IS RETURN OF CLEAR CSF.  10 CC OF OMNIPAQUE  300 WAS INJECTED INTO SUBARACHNOID SPACE WITH THE NECK EXTENDED. THE CONTRAST WAS ALLOWED TO FLOW INTO THE  CERVICAL SPINE.  THE CERVICAL MYELOGRAM REVEALS NO SIGNIFICANT EXTRADURAL DEFECTS.  THE NERVE ROOT FILLS OUT NORMALLY. CT SCANNING WAS THEN PERFORMED. THE CONTRAST LEVEL ON THE CT SCAN IS  DECREASED.  THIS MAY BE DUE TO MILD DELAY IN OBTAINING THE CT SCAN FOLLOWING THE MYELOGRAM AS WELL WITH THE PATIENT SITTING UP AFTER THE MYELOGRAM.  I DID RESCAN THE PATIENT AFTER PLACING HER PRONE AND THIS IMPROVED THE CONTRAST LEVEL IN THE SUBARACHNOID SPACE C3-4:  NEGATIVE. C4-5:  NEGATIVE. C5-6:  A SMALL CENTRAL SPUR IS SEEN.  NO DISC HERNIATION IS SEEN.  THE MRI FROM [facility] IN 12/98 SHOW A CENTRAL DISC HERNIATION WHICH APPEARS TO HAVE IMPROVED AND POSSIBLY BECOME CALCIFIED. C6-7: NEGATIVE. IMPRESSION THE CERVICAL MYELOGRAM APPEARS NORMAL. THE CT SCAN HAS DIMINISHED CONTRAST DUE TO TECHNICAL FACTORS AND THE PATIENT SITTING UP.  NO  DISC HERNIATION OR SPINAL STENOSIS IS SEEN.  THERE IS A SMALL CENTRAL SPUR AT C5-6 WHICH MAY BE RELATED TO A DISC HERNIATION SEEN ON THE MRI FROM 12/98. NO ACUTE DISC HERNIATION IS IDENTIFIED.    Thoracic Imaging: Thoracic MR wo contrast: Results for orders placed during the hospital encounter of 02/23/15  MR Thoracic Spine Wo Contrast  Narrative CLINICAL DATA:  Mid and low back pain for 2 months. No known injury. No extremity symptoms. Initial encounter.  EXAM: MRI THORACIC SPINE WITHOUT CONTRAST  TECHNIQUE: Multiplanar, multisequence MR imaging of the thoracic spine was performed. No intravenous contrast was administered.  COMPARISON:  CT chest 11/13/2014 and 07/30/2012.  FINDINGS: Vertebral body height and alignment are maintained. Mild degenerative endplate signal change is noted T9-10 and there is a hemangioma in T7. No worrisome marrow lesion. The thoracic cord demonstrates normal signal. 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 Paraspinous structures are otherwise unremarkable.  T7-8: Tiny central protrusion without central canal or  foraminal stenosis.  T8-9: Tiny central protrusion without central canal or foraminal stenosis.  T9-10: Very shallow right paracentral protrusion without central canal or foraminal stenosis.  T10-11: Small central disc protrusion without central canal or foraminal stenosis.  T11-12: Left paracentral disc protrusion without central canal or foraminal stenosis.  Except as described above, intervertebral disc spaces are unremarkable.  IMPRESSION: Mild degenerative disc disease mid and lower thoracic spine without central canal or foraminal narrowing. The study is otherwise unremarkable.   Electronically Signed By: Debby Prader M.D. On: 02/23/2015 10:05   Narrative CLINICAL DATA:  Initial evaluation for chronic lower back pain for 1 year.  EXAM: MRI LUMBAR SPINE WITHOUT CONTRAST  TECHNIQUE: Multiplanar, multisequence MR imaging of the lumbar spine was performed. No intravenous contrast was administered.  COMPARISON:  Prior study from 02/23/2015.  FINDINGS: Segmentation: Standard. Lowest well-formed disc space labeled the L5-S1 level.  Alignment: 3 mm facet mediated anterolisthesis of L4 on L5, stable. Alignment otherwise normal preservation of the normal lumbar lordosis.  Vertebrae: Vertebral body height maintained without acute or chronic fracture. Bone marrow signal intensity within normal limits. Few small benign hemangiomata noted. No worrisome osseous lesions. Reactive marrow edema present about the right L4-5 facet due to facet arthritis (series 6, image 4). No other abnormal marrow edema.  Conus medullaris and cauda equina: Conus extends to the T12-L1 level. Conus and cauda equina appear normal.  Paraspinal and other soft tissues: Paraspinous soft tissues demonstrate no acute finding. Marked splenomegaly, partially visualized. Nodular contour of the liver, suggesting cirrhosis. Few venous collaterals noted within the upper abdomen.  Disc  levels:  L1-2: Normal interspace. No spinal stenosis. Foramina remain patent.  L2-3: Disc desiccation with mild disc bulge, slightly asymmetric to the left. No spinal stenosis. Foramina remain patent.  L3-4: Disc desiccation with mild disc bulge. Mild bilateral facet spurring. No spinal stenosis. Foramina remain patent.  L4-5: 3 mm anterolisthesis. Disc desiccation with mild disc bulge. Moderate right worse than left facet hypertrophy with reactive marrow edema on the right. Resultant mild bilateral subarticular stenosis. Central canal remains patent. Foramina remain patent.  L5-S1: Negative interspace. Moderate right facet hypertrophy. No spinal stenosis. Foramina remain patent.  IMPRESSION: 1. 3 mm anterolisthesis of L4 on L5 with associated disc bulge and moderate facet hypertrophy, resulting in mild bilateral subarticular stenosis. 2. Reactive marrow edema about  the right L4-5 facet due to facet arthritis. Finding could serve as a source for lower back pain and referred symptoms. 3. Hepatic cirrhosis with splenomegaly, suggesting portal hypertension.   Electronically Signed By: Morene Hoard M.D. On: 06/16/2023 06:33  Complexity Note: Imaging results reviewed.                         ROS  Cardiovascular: Heart murmur and Heart valve problems Pulmonary or Respiratory: Lung problems, Smoking, and Snoring  Neurological: No reported neurological signs or symptoms such as seizures, abnormal skin sensations, urinary and/or fecal incontinence, being born with an abnormal open spine and/or a tethered spinal cord Psychological-Psychiatric: No reported psychological or psychiatric signs or symptoms such as difficulty sleeping, anxiety, depression, delusions or hallucinations (schizophrenial), mood swings (bipolar disorders) or suicidal ideations or attempts Gastrointestinal: Reflux or heatburn and Scarred liver (Cirrhosis) Genitourinary: Peeing blood Hematological: Low  platelet levels (Thrombocytopenia) Endocrine: High blood sugar requiring insulin  (IDDM) Rheumatologic: No reported rheumatological signs and symptoms such as fatigue, joint pain, tenderness, swelling, redness, heat, stiffness, decreased range of motion, with or without associated rash Musculoskeletal: Negative for myasthenia gravis, muscular dystrophy, multiple sclerosis or malignant hyperthermia Work History: Retired  Allergies  Vanessa Oneill is allergic to codeine, tylenol  with codeine #3 [acetaminophen -codeine], amoxicillin, lipitor [atorvastatin], lisinopril, and penicillins.  Laboratory Chemistry Profile   Renal Lab Results  Component Value Date   BUN 23 03/27/2022   CREATININE 1.00 07/12/2022   GFRAA 56 (L) 08/12/2017   GFRNONAA >60 03/27/2022   SPECGRAV <1.005 (L) 12/14/2016   PHUR 6.0 12/14/2016   PROTEINUR NEGATIVE 03/21/2022     Electrolytes Lab Results  Component Value Date   NA 129 (L) 03/27/2022   K 4.9 03/27/2022   CL 100 03/27/2022   CALCIUM 8.5 (L) 03/27/2022     Hepatic Lab Results  Component Value Date   AST 32 03/25/2022   ALT 22 03/25/2022   ALBUMIN  2.7 (L) 03/25/2022   ALKPHOS 75 03/25/2022   LIPASE 24 11/28/2016   AMMONIA 35 08/10/2017     ID Lab Results  Component Value Date   HIV Non Reactive 08/11/2017   SARSCOV2NAA NEGATIVE 03/21/2022   STAPHAUREUS NEGATIVE 03/21/2022   MRSAPCR NEGATIVE 03/21/2022     Bone No results found for: VD25OH, [id number], CI6874NY7, CI7874NY7, 25OHVITD1, 25OHVITD2, 25OHVITD3, TESTOFREE, TESTOSTERONE   Endocrine Lab Results  Component Value Date   GLUCOSE 54 (L) 03/27/2022   GLUCOSEU 150 (A) 03/21/2022   HGBA1C 7.1 (H) 03/24/2022     Neuropathy Lab Results  Component Value Date   HGBA1C 7.1 (H) 03/24/2022   HIV Non Reactive 08/11/2017     CNS No results found for: COLORCSF, APPEARCSF, RBCCOUNTCSF, WBCCSF, POLYSCSF, LYMPHSCSF, EOSCSF, PROTEINCSF, GLUCCSF, JCVIRUS,  CSFOLI, IGGCSF, LABACHR, ACETBL   Inflammation (CRP: Acute  ESR: Chronic) Lab Results  Component Value Date   CRP 16.5 (H) 10/23/2016   ESRSEDRATE 91 (H) 10/24/2016   LATICACIDVEN 1.6 08/10/2017     Rheumatology No results found for: RF, ANA, LABURIC, URICUR, LYMEIGGIGMAB, LYMEABIGMQN, HLAB27   Coagulation Lab Results  Component Value Date   INR 1.1 03/21/2022   LABPROT 13.9 03/21/2022   APTT 27 03/21/2022   PLT 81 (L) 03/28/2022     Cardiovascular Lab Results  Component Value Date   TROPONINI 0.08 (HH) 10/18/2016   HGB 11.1 (L) 03/28/2022   HCT 32.7 (L) 03/28/2022     Screening Lab Results  Component Value Date   SARSCOV2NAA  NEGATIVE 03/21/2022   COVIDSOURCE NASOPHARYNGEAL 08/31/2018   STAPHAUREUS NEGATIVE 03/21/2022   MRSAPCR NEGATIVE 03/21/2022   HIV Non Reactive 08/11/2017     Cancer No results found for: CEA, CA125, LABCA2   Allergens No results found for: ALMOND, APPLE, ASPARAGUS, AVOCADO, BANANA, BARLEY, BASIL, BAYLEAF, GREENBEAN, LIMABEAN, WHITEBEAN, BEEFIGE, REDBEET, BLUEBERRY, BROCCOLI, CABBAGE, MELON, CARROT, CASEIN, CASHEWNUT, CAULIFLOWER, CELERY     Note: Lab results reviewed.  PFSH  Drug: Vanessa Oneill  reports no history of drug use. Alcohol:  reports no history of alcohol use. Tobacco:  reports that she has been smoking cigarettes. She started smoking about 49 years ago. She has a 48 pack-year smoking history. She has never used smokeless tobacco. Medical:  has a past medical history of Acid reflux, Anxiety, Aortic atherosclerosis (HCC), Arthritis, Cancer (HCC), Cerebral aneurysm, Colon adenomas, COPD (chronic obstructive pulmonary disease) (HCC), Coronary artery disease, Depression, Diabetes mellitus without complication (HCC), ESBL (extended spectrum beta-lactamase) producing bacteria infection, Headache, Hyperlipidemia, Hypertension, Liver disease, Lumbago, Mitral valve  regurgitation, 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HCC), Personal history of tobacco use, presenting hazards to health (11/12/2014), Portal venous hypertension (HCC), Raynaud's disease, and Sleep apnea. Family: family history includes Heart Problems in her father; Heart failure in her mother; Lung cancer in her father.  Past Surgical History:  Procedure Laterality Date   ABDOMINAL HYSTERECTOMY     APPENDECTOMY     CHOLECYSTECTOMY     COLONOSCOPY WITH PROPOFOL  N/A 11/18/2018   Procedure: COLONOSCOPY WITH PROPOFOL ;  Surgeon: Toledo, Ladell POUR, MD;  Location: [facility] ENDOSCOPY;  Service: Gastroenterology;  Laterality: N/A;   COLONOSCOPY WITH PROPOFOL  N/A 04/26/2022   Procedure: COLONOSCOPY WITH PROPOFOL ;  Surgeon: Toledo, Ladell POUR, MD;  Location: [facility] ENDOSCOPY;  Service: Gastroenterology;  Laterality: N/A;   ESOPHAGOGASTRODUODENOSCOPY (EGD) WITH PROPOFOL  N/A 07/24/2016   Procedure: ESOPHAGOGASTRODUODENOSCOPY (EGD) WITH PROPOFOL ;  Surgeon: Therisa Bi, MD;  Location: [facility] ENDOSCOPY;  Service: Endoscopy;  Laterality: N/A;   ESOPHAGOGASTRODUODENOSCOPY (EGD) WITH PROPOFOL  N/A 11/21/2016   Procedure: ESOPHAGOGASTRODUODENOSCOPY (EGD) WITH PROPOFOL ;  Surgeon: Toledo, Ladell POUR, MD;  Location: [facility];  Service: Gastroenterology;  Laterality: N/A;   ESOPHAGOGASTRODUODENOSCOPY (EGD) WITH PROPOFOL  N/A 02/05/2018   Procedure: ESOPHAGOGASTRODUODENOSCOPY (EGD) WITH PROPOFOL ;  Surgeon: Toledo, Ladell POUR, MD;  Location: [facility];  Service: Endoscopy;  Laterality: N/A;   ESOPHAGOGASTRODUODENOSCOPY (EGD) WITH PROPOFOL  N/A 04/26/2022   Procedure: ESOPHAGOGASTRODUODENOSCOPY (EGD) WITH PROPOFOL ;  Surgeon: Toledo, Ladell POUR, MD;  Location: [facility];  Service: Gastroenterology;  Laterality: N/A;   INTERCOSTAL NERVE BLOCK  03/23/2022   Procedure: INTERCOSTAL NERVE BLOCK;  Surgeon: Kerrin Elspeth BROCKS, MD;  Location: [facility] OR;  Service: Thoracic;;   LYMPH NODE BIOPSY  03/23/2022   Procedure: LYMPH NODE BIOPSY;  Surgeon:  Kerrin Elspeth BROCKS, MD;  Location: [facility] OR;  Service: Thoracic;;   TONSILLECTOMY     XI ROBOTIC ASSISTED THORACOSCOPY- SEGMENTECTOMY Right 03/23/2022   Procedure: XI ROBOTIC ASSISTED THORACOSCOPY-RIGHT LOWER LOBE LATERAL BASILAR SEGMENTECTOMY;  Surgeon: Kerrin Elspeth BROCKS, MD;  Location: [facility] OR;  Service: Thoracic;  Laterality: Right;   Active Ambulatory Problems    Diagnosis Date Noted   Personal history of tobacco use, presenting hazards to health 11/12/2014   GIB (gastrointestinal bleeding) 07/22/2016   Sepsis (HCC) 10/17/2016   Septic shock (HCC) 10/18/2016   Pyelonephritis    Respiratory distress    UTI (urinary tract infection) 02/06/2017   Fever 02/06/2017   HTN (hypertension) 08/10/2017   HLD (hyperlipidemia) 08/10/2017   Diabetes (HCC) 08/10/2017   COPD (chronic obstructive pulmonary disease) (HCC) 08/10/2017  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HCC) 08/10/2017   Right lower lobe pulmonary nodule 03/23/2022   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) 04/04/2022   Degeneration of intervertebral disc of lumbar region with discogenic back pain 07/03/2023   Chronic pain syndrome 07/03/2023   Chronic painful diabetic neuropathy (HCC) 07/03/2023   Occipital neuralgia of right side 07/03/2023   Resolved Ambulatory Problems    Diagnosis Date Noted   No Resolved Ambulatory Problems   Past Medical History:  Diagnosis Date   Acid reflux    Anxiety    Aortic atherosclerosis (HCC)    Arthritis    Cancer (HCC)    Cerebral aneurysm    Colon adenomas    Coronary artery disease    Depression    Diabetes mellitus without complication (HCC)    ESBL (extended spectrum beta-lactamase) producing bacteria infection    Headache    Hyperlipidemia    Hypertension    Liver disease    Lumbago    Mitral valve regurgitation    Portal venous hypertension (HCC)    Raynaud's disease    Sleep apnea    Constitutional Exam  General appearance: Well nourished, well developed, and  well hydrated. In no apparent acute distress Vitals:   07/03/23 1355  BP: 139/87  Pulse: 75  Resp: 17  Temp: (!) 97.1 F (36.2 C)  SpO2: 94%  Weight: 170 lb (77.1 kg)  Height: 5' 7 (1.702 m)   BMI Assessment: Estimated body mass index is 26.63 kg/m as calculated from the following:   Height as of this encounter: 5' 7 (1.702 m).   Weight as of this encounter: 170 lb (77.1 kg).  BMI interpretation table: BMI level Category Range association with higher incidence of chronic pain  <18 kg/m2 Underweight   18.5-24.9 kg/m2 Ideal body weight   25-29.9 kg/m2 Overweight Increased incidence by 20%  30-34.9 kg/m2 Obese (Class I) Increased incidence by 68%  35-39.9 kg/m2 Severe obesity (Class II) Increased incidence by 136%  >40 kg/m2 Extreme obesity (Class III) Increased incidence by 254%   Patient's current BMI Ideal Body weight  Body mass index is 26.63 kg/m. Ideal body weight: 61.6 kg (135 lb 12.9 oz) Adjusted ideal body weight: 67.8 kg (149 lb 7.7 oz)   BMI Readings from Last 4 Encounters:  07/03/23 26.63 kg/m  01/26/23 28.19 kg/m  07/24/22 26.28 kg/m  05/02/22 26.78 kg/m   Wt Readings from Last 4 Encounters:  07/03/23 170 lb (77.1 kg)  01/26/23 180 lb (81.6 kg)  07/24/22 167 lb 12.8 oz (76.1 kg)  05/02/22 171 lb (77.6 kg)    Psych/Mental status: Alert, oriented x 3 (person, place, & time)       Eyes: PERLA Respiratory: No evidence of acute respiratory distress  Occipital neuralgia on the right  Low back pain, worse with lumbar facet loading and lateral rotation  Bilateral paresthesias of feet  Assessment  Primary Diagnosis & Pertinent Problem List: The primary encounter diagnosis was Lumbar facet arthropathy. Diagnoses of Lumbar spondylosis, Degeneration of intervertebral disc of lumbar region with discogenic back pain, Chronic painful diabetic neuropathy (HCC), Occipital neuralgia of right side, and Chronic pain syndrome were also pertinent to this  visit.  Visit Diagnosis (New problems to examiner): 1. Lumbar facet arthropathy   2. Lumbar spondylosis   3. Degeneration of intervertebral disc of lumbar region with discogenic back pain   4. Chronic painful diabetic neuropathy (HCC)   5. Occipital neuralgia of right side   6. 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 MRI reveals arthritis, a disc bulge, and a 3mm shift at L4-L5. Pain is severe, affecting mobility and causing difficulty in standing and walking. Surgery is not indicated at this time. Pain management will focus on interventional procedures. Vanessa Oneill has a history of greater than 3 months of moderate to severe pain which is resulted in functional impairment.  The patient has tried various conservative therapeutic options such as NSAIDs, Tylenol , muscle relaxants, physical therapy which was inadequately effective.  Patient's pain is predominantly axial with physical exam and L-MRI findings suggestive of facet arthropathy. Lumbar facet medial branch nerve blocks were discussed with the patient.  Risks and benefits were reviewed.  Patient would like to proceed with bilateral L3, L4, L5 medial branch nerve block.  Occipital neuralgia   Occipital neuralgia is suspected due to pain radiating from the neck upwards, causing headaches. Pain is less severe than the lower back pain but still significant. Consider a nerve block for occipital neuralgia after addressing lower back pain.  Type 2 diabetes mellitus with diabetic neuropathy   Type 2 diabetes is associated with diabetic neuropathy, presenting as burning and tingling in the feet, particularly in the toes. Currently managed with gabapentin . Discussed alternative treatment with Qutenza, a capsaicin film, for diabetic  neuropathy. Qutenza is applied every three months and provides pain relief for up to three months. Order Qutenza treatment for diabetic neuropathy and schedule its application on a different day from the back procedure.       Lab Orders         Compliance Drug Analysis, Ur      Procedure Orders         LUMBAR FACET(MEDIAL BRANCH NERVE BLOCK) MBNB         NEUROLYSIS       Interventional management options: Vanessa Oneill was informed that there is no guarantee that she would be a candidate for interventional therapies. The decision will be based on the results of diagnostic studies, as well as Vanessa Oneill's risk profile.  Procedure(s) under consideration:  Lumbar medial branch nerve block, lumbar RFA Qutenza Cervical medial branch nerve block Occipital nerve block  Provider-requested follow-up: Return in about 8 days (around 07/11/2023) for B/L Lumbar facets L3, 4, 5 MBNB , in clinic IV Versed .  Future Appointments  Date Time Provider Department Center  07/10/2023  3:00 PM [facility]  07/26/2023  2:15 PM [facility] None  07/26/2023  2:30 PM Babara Call, MD [facility] None   I discussed the assessment and treatment plan with the patient. The patient was provided an opportunity to ask questions and all were answered. The patient agreed with the plan and demonstrated an understanding of the instructions.  Patient advised to call back or seek an in-person evaluation if the symptoms or condition worsens.  Duration of encounter: .  Total time on encounter, as per AMA guidelines included both the face-to-face and non-face-to-face time personally spent by the physician and/or other qualified health care professional(s) on the day of the encounter (includes time in activities that require the physician or other qualified health care professional and does not include time in activities normally performed by clinical staff). Physician's time may include the following  activities when performed:  Preparing to see the patient (e.g., pre-charting review of records, searching for previously ordered imaging, lab work, and nerve conduction tests) Review of prior analgesic pharmacotherapies. Reviewing PMP Interpreting ordered tests (e.g., lab work, imaging, nerve conduction tests) Performing post-procedure evaluations, including interpretation of diagnostic procedures Obtaining and/or reviewing separately obtained history Performing a medically appropriate examination and/or evaluation Counseling and educating the patient/family/caregiver Ordering medications, tests, or procedures Referring and communicating with other health care professionals (when not separately reported) Documenting clinical information in the electronic or other health record Independently interpreting results (not separately reported) and communicating results to the patient/ family/caregiver Care coordination (not separately reported)  Note by: Wallie Sherry, MD (TTS and AI technology used. I apologize for any typographical errors that were not detected and corrected.) Date: 07/03/2023; Time: 3:12 PM

## 2023-07-03 NOTE — Patient Instructions (Signed)
 ______________________________________________________________________    General Risks and Possible Complications  Patient Responsibilities: It is important that you read this as it is part of your informed consent. It is our duty to inform you of the risks and possible complications associated with treatments offered to you. It is your responsibility as a patient to read this and to ask questions about anything that is not clear or that you believe was not covered in this document.  Patient's Rights: You have the right to refuse treatment. You also have the right to change your mind, even after initially having agreed to have the treatment done. However, under this last option, if you wait until the last second to change your mind, you may be charged for the materials used up to that point.  Introduction: Medicine is not an Visual merchandiser. Everything in Medicine, including the lack of treatment(s), carries the potential for danger, harm, or loss (which is by definition: Risk). In Medicine, a complication is a secondary problem, condition, or disease that can aggravate an already existing one. All treatments carry the risk of possible complications. The fact that a side effects or complications occurs, does not imply that the treatment was conducted incorrectly. It must be clearly understood that these can happen even when everything is done following the highest safety standards.  No treatment: You can choose not to proceed with the proposed treatment alternative. The "PRO(s)" would include: avoiding the risk of complications associated with the therapy. The "CON(s)" would include: not getting any of the treatment benefits. These benefits fall under one of three categories: diagnostic; therapeutic; and/or palliative. Diagnostic benefits include: getting information which can ultimately lead to improvement of the disease or symptom(s). Therapeutic benefits are those associated with the successful  treatment of the disease. Finally, palliative benefits are those related to the decrease of the primary symptoms, without necessarily curing the condition (example: decreasing the pain from a flare-up of a chronic condition, such as incurable terminal cancer).  General Risks and Complications: These are associated to most interventional treatments. They can occur alone, or in combination. They fall under one of the following six (6) categories: no benefit or worsening of symptoms; bleeding; infection; nerve damage; allergic reactions; and/or death. No benefits or worsening of symptoms: In Medicine there are no guarantees, only probabilities. No healthcare provider can ever guarantee that a medical treatment will work, they can only state the probability that it may. Furthermore, there is always the possibility that the condition may worsen, either directly, or indirectly, as a consequence of the treatment. Bleeding: This is more common if the patient is taking a blood thinner, either prescription or over the counter (example: Goody Powders, Fish oil, Aspirin, Garlic, etc.), or if suffering a condition associated with impaired coagulation (example: Hemophilia, cirrhosis of the liver, low platelet counts, etc.). However, even if you do not have one on these, it can still happen. If you have any of these conditions, or take one of these drugs, make sure to notify your treating physician. Infection: This is more common in patients with a compromised immune system, either due to disease (example: diabetes, cancer, human immunodeficiency virus [HIV], etc.), or due to medications or treatments (example: therapies used to treat cancer and rheumatological diseases). However, even if you do not have one on these, it can still happen. If you have any of these conditions, or take one of these drugs, make sure to notify your treating physician. Nerve Damage: This is more common when the treatment is  an invasive one, but it  can also happen with the use of medications, such as those used in the treatment of cancer. The damage can occur to small secondary nerves, or to large primary ones, such as those in the spinal cord and brain. This damage may be temporary or permanent and it may lead to impairments that can range from temporary numbness to permanent paralysis and/or brain death. Allergic Reactions: Any time a substance or material comes in contact with our body, there is the possibility of an allergic reaction. These can range from a mild skin rash (contact dermatitis) to a severe systemic reaction (anaphylactic reaction), which can result in death. Death: In general, any medical intervention can result in death, most of the time due to an unforeseen complication. ______________________________________________________________________      ______________________________________________________________________    Preparing for your procedure  Appointments: If you think you may not be able to keep your appointment, call 24-48 hours in advance to cancel. We need time to make it available to others.  Procedure visits are for procedures only. During your procedure appointment there will be: NO Prescription Refills*. NO medication changes or discussions*. NO discussion of disability issues*. NO unrelated pain problem evaluations*. NO evaluations to order other pain procedures*. *These will be addressed at a separate and distinct evaluation encounter on the provider's evaluation schedule and not during procedure days.  Instructions: Food intake: Avoid eating anything solid for at least 8 hours prior to your procedure. Clear liquid intake: You may take clear liquids such as water up to 2 hours prior to your procedure. (No carbonated drinks. No soda.) Transportation: Unless otherwise stated by your physician, bring a driver. (Driver cannot be a Market researcher, Pharmacist, community, or any other form of public transportation.) Morning  Medicines: Except for blood thinners, take all of your other morning medications with a sip of water. Make sure to take your heart and blood pressure medicines. If your blood pressure's lower number is above 100, the case will be rescheduled. Blood thinners: Make sure to stop your blood thinners as instructed.  If you take a blood thinner, but were not instructed to stop it, call our office [phone] and ask to talk to a nurse. Not stopping a blood thinner prior to certain procedures could lead to serious complications. Diabetics on insulin: Notify the staff so that you can be scheduled 1st case in the morning. If your diabetes requires high dose insulin, take only  of your normal insulin dose the morning of the procedure and notify the staff that you have done so. Preventing infections: Shower with an antibacterial soap the morning of your procedure.  Build-up your immune system: Take 1000 mg of Vitamin C with every meal (3 times a day) the day prior to your procedure. Antibiotics: Inform the nursing staff if you are taking any antibiotics or if you have any conditions that may require antibiotics prior to procedures. (Example: recent joint implants)   Pregnancy: If you are pregnant make sure to notify the nursing staff. Not doing so may result in injury to the fetus, including death.  Sickness: If you have a cold, fever, or any active infections, call and cancel or reschedule your procedure. Receiving steroids while having an infection may result in complications. Arrival: You must be in the facility at least 30 minutes prior to your scheduled procedure. Tardiness: Your scheduled time is also the cutoff time. If you do not arrive at least 15 minutes prior to your procedure, you will  be rescheduled.  Children: Do not bring any children with you. Make arrangements to keep them home. Dress appropriately: There is always a possibility that your clothing may get soiled. Avoid long dresses. Valuables:  Do not bring any jewelry or valuables.  Reasons to call and reschedule or cancel your procedure: (Following these recommendations will minimize the risk of a serious complication.) Surgeries: Avoid having procedures within 2 weeks of any surgery. (Avoid for 2 weeks before or after any surgery). Flu Shots: Avoid having procedures within 2 weeks of a flu shots or . (Avoid for 2 weeks before or after immunizations). Barium: Avoid having a procedure within 7-10 days after having had a radiological study involving the use of radiological contrast. (Myelograms, Barium swallow or enema study). Heart attacks: Avoid any elective procedures or surgeries for the initial 6 months after a "Myocardial Infarction" (Heart Attack). Blood thinners: It is imperative that you stop these medications before procedures. Let us know if you if you take any blood thinner.  Infection: Avoid procedures during or within two weeks of an infection (including chest colds or gastrointestinal problems). Symptoms associated with infections include: Localized redness, fever, chills, night sweats or profuse sweating, burning sensation when voiding, cough, congestion, stuffiness, runny nose, sore throat, diarrhea, nausea, vomiting, cold or Flu symptoms, recent or current infections. It is specially important if the infection is over the area that we intend to treat. Heart and lung problems: Symptoms that may suggest an active cardiopulmonary problem include: cough, chest pain, breathing difficulties or shortness of breath, dizziness, ankle swelling, uncontrolled high or unusually low blood pressure, and/or palpitations. If you are experiencing any of these symptoms, cancel your procedure and contact your primary care physician for an evaluation.  Remember:  Regular Business hours are:  Monday to Thursday 8:00 AM to 4:00 PM  Provider's Schedule: Delano Metz, MD:  Procedure days: Tuesday and Thursday 7:30 AM to 4:00 PM  Edward Jolly, MD:  Procedure days: Monday and Wednesday 7:30 AM to 4:00 PM Last  Updated: 12/12/2022 ______________________________________________________________________     Facet Blocks Patient Information  Description: The facets are joints in the spine between the vertebrae.  Like any joints in the body, facets can become irritated and painful.  Arthritis can also effect the facets.  By injecting steroids and local anesthetic in and around these joints, we can temporarily block the nerve supply to them.  Steroids act directly on irritated nerves and tissues to reduce selling and inflammation which often leads to decreased pain.  Facet blocks may be done anywhere along the spine from the neck to the low back depending upon the location of your pain.   After numbing the skin with local anesthetic (like Novocaine), a small needle is passed onto the facet joints under x-ray guidance.  You may experience a sensation of pressure while this is being done.  The entire block usually lasts about 15-25 minutes.   Conditions which may be treated by facet blocks:  Low back/buttock pain Neck/shoulder pain Certain types of headaches  Preparation for the injection:  Do not eat any solid food or dairy products within 8 hours of your appointment. You may drink clear liquid up to 3 hours before appointment.  Clear liquids include water, black coffee, juice or soda.  No milk or cream please. You may take your regular medication, including pain medications, with a sip of water before your appointment.  Diabetics should hold regular insulin (if taken separately) and take 1/2 normal NPH dose  the morning of the procedure.  Carry some sugar containing items with you to your appointment. A driver must accompany you and be prepared to drive you home after your procedure. Bring all your current medications with you. An IV may be inserted and sedation may be given at the discretion of the physician. A blood pressure  cuff, EKG and other monitors will often be applied during the procedure.  Some patients may need to have extra oxygen administered for a short period. You will be asked to provide medical information, including your allergies and medications, prior to the procedure.  We must know immediately if you are taking blood thinners (like Coumadin/Warfarin) or if you are allergic to IV iodine contrast (dye).  We must know if you could possible be pregnant.  Possible side-effects:  Bleeding from needle site Infection (rare, may require surgery) Nerve injury (rare) Numbness & tingling (temporary) Difficulty urinating (rare, temporary) Spinal headache (a headache worse with upright posture) Light-headedness (temporary) Pain at injection site (serveral days) Decreased blood pressure (rare, temporary) Weakness in arm/leg (temporary) Pressure sensation in back/neck (temporary)   Call if you experience:  Fever/chills associated with headache or increased back/neck pain Headache worsened by an upright position New onset, weakness or numbness of an extremity below the injection site Hives or difficulty breathing (go to the emergency room) Inflammation or drainage at the injection site(s) Severe back/neck pain greater than usual New symptoms which are concerning to you  Please note:  Although the local anesthetic injected can often make your back or neck feel good for several hours after the injection, the pain will likely return. It takes 3-7 days for steroids to work.  You may not notice any pain relief for at least one week.  If effective, we will often do a series of 2-3 injections spaced 3-6 weeks apart to maximally decrease your pain.  After the initial series, you may be a candidate for a more permanent nerve block of the facets.  If you have any questions, please call [phone] [facility]

## 2023-07-04 DIAGNOSIS — E042 Nontoxic multinodular goiter: Secondary | ICD-10-CM | POA: Diagnosis not present

## 2023-07-04 DIAGNOSIS — E785 Hyperlipidemia, unspecified: Secondary | ICD-10-CM | POA: Diagnosis not present

## 2023-07-04 DIAGNOSIS — E1169 Type 2 diabetes mellitus with other specified complication: Secondary | ICD-10-CM | POA: Diagnosis not present

## 2023-07-04 DIAGNOSIS — Z794 Long term (current) use of insulin: Secondary | ICD-10-CM | POA: Diagnosis not present

## 2023-07-04 DIAGNOSIS — E1165 Type 2 diabetes mellitus with hyperglycemia: Secondary | ICD-10-CM | POA: Diagnosis not present

## 2023-07-04 DIAGNOSIS — I152 Hypertension secondary to endocrine disorders: Secondary | ICD-10-CM | POA: Diagnosis not present

## 2023-07-04 DIAGNOSIS — E1159 Type 2 diabetes mellitus with other circulatory complications: Secondary | ICD-10-CM | POA: Diagnosis not present

## 2023-07-04 DIAGNOSIS — E1140 Type 2 diabetes mellitus with diabetic neuropathy, unspecified: Secondary | ICD-10-CM | POA: Diagnosis not present

## 2023-07-06 LAB — COMPLIANCE DRUG ANALYSIS, UR

## 2023-07-10 ENCOUNTER — Ambulatory Visit
Admission: RE | Admit: 2023-07-10 | Discharge: 2023-07-10 | Disposition: A | Discharge status: Home / Self Care | Payer: Medicare HMO | Source: Ambulatory Visit | Attending: Internal Medicine | Admitting: Internal Medicine

## 2023-07-10 DIAGNOSIS — E042 Nontoxic multinodular goiter: Secondary | ICD-10-CM | POA: Diagnosis not present

## 2023-07-10 DIAGNOSIS — C3490 Malignant neoplasm of unspecified part of unspecified bronchus or lung: Secondary | ICD-10-CM | POA: Diagnosis not present

## 2023-07-10 DIAGNOSIS — C3431 Malignant neoplasm of lower lobe, right bronchus or lung: Secondary | ICD-10-CM | POA: Insufficient documentation

## 2023-07-10 DIAGNOSIS — J432 Centrilobular emphysema: Secondary | ICD-10-CM | POA: Diagnosis not present

## 2023-07-10 MED ORDER — IOHEXOL 300 MG/ML  SOLN
75.0000 mL | Freq: Once | INTRAMUSCULAR | Status: AC | PRN
  Administered 2023-07-10: 75 mL via INTRAVENOUS

## 2023-07-11 DIAGNOSIS — E1165 Type 2 diabetes mellitus with hyperglycemia: Secondary | ICD-10-CM | POA: Diagnosis not present

## 2023-07-11 DIAGNOSIS — D696 Thrombocytopenia, unspecified: Secondary | ICD-10-CM | POA: Diagnosis not present

## 2023-07-11 DIAGNOSIS — J449 Chronic obstructive pulmonary disease, unspecified: Secondary | ICD-10-CM | POA: Diagnosis not present

## 2023-07-11 DIAGNOSIS — K7460 Unspecified cirrhosis of liver: Secondary | ICD-10-CM | POA: Diagnosis not present

## 2023-07-11 DIAGNOSIS — E785 Hyperlipidemia, unspecified: Secondary | ICD-10-CM | POA: Diagnosis not present

## 2023-07-11 DIAGNOSIS — F3341 Major depressive disorder, recurrent, in partial remission: Secondary | ICD-10-CM | POA: Diagnosis not present

## 2023-07-11 DIAGNOSIS — R519 Headache, unspecified: Secondary | ICD-10-CM | POA: Diagnosis not present

## 2023-07-11 DIAGNOSIS — Z79891 Long term (current) use of opiate analgesic: Secondary | ICD-10-CM | POA: Diagnosis not present

## 2023-07-11 DIAGNOSIS — I10 Essential (primary) hypertension: Secondary | ICD-10-CM | POA: Diagnosis not present

## 2023-07-25 ENCOUNTER — Other Ambulatory Visit: Payer: Self-pay

## 2023-07-25 DIAGNOSIS — C3431 Malignant neoplasm of lower lobe, right bronchus or lung: Secondary | ICD-10-CM

## 2023-07-26 ENCOUNTER — Inpatient Hospital Stay: Admitting: Oncology

## 2023-07-26 ENCOUNTER — Encounter: Payer: Self-pay | Admitting: Oncology

## 2023-07-26 ENCOUNTER — Ambulatory Visit: Payer: Medicare HMO | Admitting: Internal Medicine

## 2023-07-26 ENCOUNTER — Inpatient Hospital Stay: Payer: Medicare HMO | Attending: Oncology

## 2023-07-26 VITALS — BP 134/75 | HR 82 | Temp 98.0°F | Resp 18 | Wt 173.3 lb

## 2023-07-26 DIAGNOSIS — C3431 Malignant neoplasm of lower lobe, right bronchus or lung: Secondary | ICD-10-CM

## 2023-07-26 DIAGNOSIS — Z902 Acquired absence of lung [part of]: Secondary | ICD-10-CM | POA: Insufficient documentation

## 2023-07-26 DIAGNOSIS — Z85118 Personal history of other malignant neoplasm of bronchus and lung: Secondary | ICD-10-CM | POA: Insufficient documentation

## 2023-07-26 DIAGNOSIS — Z79899 Other long term (current) drug therapy: Secondary | ICD-10-CM | POA: Diagnosis not present

## 2023-07-26 DIAGNOSIS — I10 Essential (primary) hypertension: Secondary | ICD-10-CM | POA: Diagnosis not present

## 2023-07-26 DIAGNOSIS — K219 Gastro-esophageal reflux disease without esophagitis: Secondary | ICD-10-CM | POA: Insufficient documentation

## 2023-07-26 DIAGNOSIS — D696 Thrombocytopenia, unspecified: Secondary | ICD-10-CM | POA: Diagnosis not present

## 2023-07-26 DIAGNOSIS — F419 Anxiety disorder, unspecified: Secondary | ICD-10-CM | POA: Insufficient documentation

## 2023-07-26 DIAGNOSIS — K7460 Unspecified cirrhosis of liver: Secondary | ICD-10-CM

## 2023-07-26 DIAGNOSIS — E1159 Type 2 diabetes mellitus with other circulatory complications: Secondary | ICD-10-CM | POA: Diagnosis not present

## 2023-07-26 DIAGNOSIS — J449 Chronic obstructive pulmonary disease, unspecified: Secondary | ICD-10-CM

## 2023-07-26 DIAGNOSIS — F32A Depression, unspecified: Secondary | ICD-10-CM | POA: Insufficient documentation

## 2023-07-26 DIAGNOSIS — Z801 Family history of malignant neoplasm of trachea, bronchus and lung: Secondary | ICD-10-CM | POA: Insufficient documentation

## 2023-07-26 DIAGNOSIS — Z87891 Personal history of nicotine dependence: Secondary | ICD-10-CM | POA: Diagnosis not present

## 2023-07-26 DIAGNOSIS — F17210 Nicotine dependence, cigarettes, uncomplicated: Secondary | ICD-10-CM | POA: Diagnosis not present

## 2023-07-26 LAB — CBC WITH DIFFERENTIAL (CANCER CENTER ONLY)
Abs Immature Granulocytes: 0.02 K/uL (ref 0.00–0.07)
Basophils Absolute: 0 K/uL (ref 0.0–0.1)
Basophils Relative: 0 %
Eosinophils Absolute: 0.1 K/uL (ref 0.0–0.5)
Eosinophils Relative: 1 %
HCT: 35.4 % — ABNORMAL LOW (ref 36.0–46.0)
Hemoglobin: 11.6 g/dL — ABNORMAL LOW (ref 12.0–15.0)
Immature Granulocytes: 0 %
Lymphocytes Relative: 13 %
Lymphs Abs: 0.7 K/uL (ref 0.7–4.0)
MCH: 27.6 pg (ref 26.0–34.0)
MCHC: 32.8 g/dL (ref 30.0–36.0)
MCV: 84.3 fL (ref 80.0–100.0)
Monocytes Absolute: 0.4 K/uL (ref 0.1–1.0)
Monocytes Relative: 7 %
Neutro Abs: 3.8 K/uL (ref 1.7–7.7)
Neutrophils Relative %: 79 %
Platelet Count: 78 K/uL — ABNORMAL LOW (ref 150–400)
RBC: 4.2 MIL/uL (ref 3.87–5.11)
RDW: 15.9 % — ABNORMAL HIGH (ref 11.5–15.5)
WBC Count: 4.9 K/uL (ref 4.0–10.5)
nRBC: 0 % (ref 0.0–0.2)

## 2023-07-26 LAB — CMP (CANCER CENTER ONLY)
ALT: 17 U/L (ref 0–44)
AST: 33 U/L (ref 15–41)
Albumin: 3.9 g/dL (ref 3.5–5.0)
Alkaline Phosphatase: 119 U/L (ref 38–126)
Anion gap: 9 (ref 5–15)
BUN: 9 mg/dL (ref 8–23)
CO2: 23 mmol/L (ref 22–32)
Calcium: 8.6 mg/dL — ABNORMAL LOW (ref 8.9–10.3)
Chloride: 102 mmol/L (ref 98–111)
Creatinine: 0.74 mg/dL (ref 0.44–1.00)
GFR, Estimated: >60 mL/min (ref >60)
Glucose, Bld: 188 mg/dL — ABNORMAL HIGH (ref 70–99)
Potassium: 4.1 mmol/L (ref 3.5–5.1)
Sodium: 134 mmol/L — ABNORMAL LOW (ref 135–145)
Total Bilirubin: 1.2 mg/dL (ref 0.0–1.2)
Total Protein: 7.1 g/dL (ref 6.5–8.1)

## 2023-07-26 NOTE — Assessment & Plan Note (Signed)
 Recommend smoking cessation.

## 2023-07-26 NOTE — Assessment & Plan Note (Signed)
 Due to splenomegaly.  Stable Observation.

## 2023-07-26 NOTE — Progress Notes (Signed)
 Pt here for follow up. BP elevated, pt reports she does not take BP medication every day.

## 2023-07-26 NOTE — Assessment & Plan Note (Signed)
 Patient follows up with pulmonology Dr. Theotis.  Continue use of inhalers.

## 2023-07-26 NOTE — Assessment & Plan Note (Signed)
 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 pT1b pN0 On imaging surveillance. CT images were reviewed and discussed with patient. Pulmonary cyst is stable in size. Progressive subpleural nodularity in the bilateral upper lobes  Discussed about option of repeating CT scan in 3 to 6 months versus obtaining PET scan for further evaluation.  Patient elects to get a PET scan evaluation.  Will obtain.

## 2023-07-26 NOTE — Assessment & Plan Note (Addendum)
 With splenomegaly  follow-up with gastroenterology.

## 2023-07-26 NOTE — Progress Notes (Addendum)
 Hematology/Oncology Progress note [phone] [phone]     Patient Care Team: Fernande Ophelia JINNY DOUGLAS, MD as PCP - General (Internal Medicine) Theotis Lavelle BRAVO, MD as Referring Physician (Specialist) Babara Call, MD as Consulting Physician (Oncology)  REFERRING PROVIDER: Dr. 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 pT1b pN0 On imaging surveillance. CT images were reviewed and discussed with patient. Pulmonary cyst is stable in size. Progressive subpleural nodularity in the bilateral upper lobes  Discussed about option of repeating CT scan in 3 to 6 months versus obtaining PET scan for further evaluation.  Patient elects to get a PET scan evaluation.  Will obtain.   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HCC) With splenomegaly  follow-up with gastroenterology.  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  Stable Observation.  COPD (chronic obstructive pulmonary disease) (HCC) Patient follows up with pulmonology Dr. Theotis.  Continue use of inhalers.  Personal history of tobacco use, presenting hazards to health Recommend smoking cessation.  Orders Placed This Encounter  Procedures   NM PET Image Restag (PS) Skull Base  To Thigh    Standing Status:   Future    Number of Occurrences:   1    Expected Date:   08/02/2023    Expiration Date:   07/25/2024    If indicated for the ordered procedure, I authorize the administration of a radiopharmaceutical per Radiology protocol:   Yes    Preferred imaging location?:   [facility]    All questions were answered. The patient knows to call the clinic with any problems, questions or concerns.  Call Babara, MD, PhD [facility] Hematology Oncology 07/26/2023    HISTORY OF PRESENTING ILLNESS:  Vanessa Oneill 73 y.o. 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  Interval history Patient seen today as follow-up for stage I right lung squamous cell cancer on surveillance. Patient previously followed up with Dr.Agrawal. Patient switched care to me on 07/26/2023 Extensive medical record review was performed by me   I have reviewed her chart and materials related to her cancer extensively and collaborated history with the patient. 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  Other lung nodules were stable.  Follow-up in 6 months was recommended.  Repeat CT chest done on 01/25/2022 showed growing right lower lobe nodule to 11.6 mm.  Liver cirrhosis and splenomegaly.   02/13/2022 PET scan   PET scan  showed 11.6 mm right lower lobe pulmonary nodule with SUV of 2.5.  Low level hypermetabolic  some associated with 2.5 cm peripheral subsolid lesion with multiple air cyst SUV of 3.2.  These may be infectious/inflammatory although low-grade neoplasm cannot be excluded.   03/23/2022 Definitive Surgery   S/p robotic assisted right lower lobe  lateral basilar segmentectomy, lymph node sampling and intercostal nerve blocks levels 4 through 10 by Dr. Kerrin   04/04/2022 Cancer Staging   Staging form: Lung, AJCC 8th Edition - Pathologic: Stage IA2 (pT1b, pN0, cM0) - Signed by Kerrin Elspeth BROCKS, MD on 04/04/2022    Pathology Results   A. RIGHT LUNG, LOWER LOBE, BRONCHIAL MARGIN, EXCISION: Negative for carcinoma, margin free  B. RIGHT LUNG, LOWER LOBE, LATERAL SEGMENT, NODULE, SEGMENTECTOMY: Invasive well to moderately differentiated squamous cell carcinoma Tumor measures 1.6 x 1.3 x 1.0 cm (pT1b) Margins free  C. LYMPH NODE, LEVEL 9, EXCISION: Benign anthracotic stroma Negative for lymph node  D. LYMPH NODE, LEVEL 7, EXCISION: One benign lymph node with adjacent calcified nodule, negative for carcinoma (0/1)  E. LYMPH NODE, LEVEL 7 #2, EXCISION: One benign lymph node, negative for carcinoma (0/1)  F. LYMPH NODE, LEVEL 12, EXCISION: One benign lymph node, negative for carcinoma (0/1)  G. LYMPH NODE, LEVEL 12 #2, EXCISION: One benign lymph node, negative for carcinoma (0/1)  H. LYMPH NODE, LEVEL 13, EXCISION: One benign lymph node, negative for carcinoma (0/1)  I. LYMPH NODE, LEVEL 13 #2, EXCISION: One benign lymph node, negative for carcinoma (0/1)  J. LYMPH NODE, LEVEL 13 #3, EXCISION: Mucus with admixed benign bronchial cells and pulmonary macrophages  K. LYMPH NODE, 10R, EXCISION: One benign lymph node, negative for carcinoma (0/1)  L. LYMPH NODE, 4R, EXCISION: One benign lymph node, negative for carcinoma (0/1)   ONCOLOGY TABLE:  LUNG: Resection  Synchronous Tumors: Not applicable Total Number of Primary Tumors: 1 Procedure: Segmentectomy with lymph node sampling Specimen Laterality: Right Tumor Focality: Unifocal Tumor Site: Lower lobe, lateral segment Tumor Size: 1.6 x 1.3 x 1.0 cm Histologic Type: Squamous cell carcinoma Visceral Pleura Invasion: Not identified Direct Invasion of Adjacent  Structures: No adjacent structures present Lymphovascular Invasion: Not identified Margins: All margins negative for invasive carcinoma      Closest Margin(s) to Invasive Carcinoma: 0.8 cm from stapled parenchymal margin Treatment Effect: No known presurgical therapy Regional Lymph Nodes:      Number of Lymph Nodes Involved: 0      Number of Lymph Nodes Examined: 9                      Nodal Sites Examined: Stations 4R, 10R, 7, 12R and 13R      MEDICAL HISTORY:  Past Medical History:  Diagnosis Date   Acid reflux    Anxiety    Aortic atherosclerosis (HCC)    Arthritis    Cancer (HCC)    sqamous cell carcinoma of right lung   Cerebral aneurysm    Colon adenomas    COPD (chronic obstructive pulmonary disease) (HCC)    Coronary artery disease    Depression    Diabetes mellitus without complication (HCC)    ESBL (extended spectrum beta-lactamase) producing bacteria infection    Headache    Hyperlipidemia    Hypertension    Liver disease    Lumbago    Mitral valve regurgitation    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HCC)    Personal history of tobacco use, presenting hazards to health 11/12/2014   Portal venous hypertension (HCC)    Raynaud's disease    Sleep apnea  SURGICAL HISTORY: Past Surgical History:  Procedure Laterality Date   ABDOMINAL HYSTERECTOMY     APPENDECTOMY     CHOLECYSTECTOMY     COLONOSCOPY WITH PROPOFOL  N/A 11/18/2018   Procedure: COLONOSCOPY WITH PROPOFOL ;  Surgeon: Toledo, Ladell POUR, MD;  Location: [facility] ENDOSCOPY;  Service: Gastroenterology;  Laterality: N/A;   COLONOSCOPY WITH PROPOFOL  N/A 04/26/2022   Procedure: COLONOSCOPY WITH PROPOFOL ;  Surgeon: Toledo, Ladell POUR, MD;  Location: [facility] ENDOSCOPY;  Service: Gastroenterology;  Laterality: N/A;   ESOPHAGOGASTRODUODENOSCOPY (EGD) WITH PROPOFOL  N/A 07/24/2016   Procedure: ESOPHAGOGASTRODUODENOSCOPY (EGD) WITH PROPOFOL ;  Surgeon: Therisa Bi, MD;  Location: [facility] ENDOSCOPY;  Service: Endoscopy;  Laterality:  N/A;   ESOPHAGOGASTRODUODENOSCOPY (EGD) WITH PROPOFOL  N/A 11/21/2016   Procedure: ESOPHAGOGASTRODUODENOSCOPY (EGD) WITH PROPOFOL ;  Surgeon: Toledo, Ladell POUR, MD;  Location: [facility] ENDOSCOPY;  Service: Gastroenterology;  Laterality: N/A;   ESOPHAGOGASTRODUODENOSCOPY (EGD) WITH PROPOFOL  N/A 02/05/2018   Procedure: ESOPHAGOGASTRODUODENOSCOPY (EGD) WITH PROPOFOL ;  Surgeon: Toledo, Ladell POUR, MD;  Location: [facility] ENDOSCOPY;  Service: Endoscopy;  Laterality: N/A;   ESOPHAGOGASTRODUODENOSCOPY (EGD) WITH PROPOFOL  N/A 04/26/2022   Procedure: ESOPHAGOGASTRODUODENOSCOPY (EGD) WITH PROPOFOL ;  Surgeon: Toledo, Ladell POUR, MD;  Location: [facility] ENDOSCOPY;  Service: Gastroenterology;  Laterality: N/A;   INTERCOSTAL NERVE BLOCK  03/23/2022   Procedure: INTERCOSTAL NERVE BLOCK;  Surgeon: Kerrin Elspeth BROCKS, MD;  Location: [facility] OR;  Service: Thoracic;;   LYMPH NODE BIOPSY  03/23/2022   Procedure: LYMPH NODE BIOPSY;  Surgeon: Kerrin Elspeth BROCKS, MD;  Location: [facility] OR;  Service: Thoracic;;   TONSILLECTOMY     XI ROBOTIC ASSISTED THORACOSCOPY- SEGMENTECTOMY Right 03/23/2022   Procedure: XI ROBOTIC ASSISTED THORACOSCOPY-RIGHT LOWER LOBE LATERAL BASILAR SEGMENTECTOMY;  Surgeon: Kerrin Elspeth BROCKS, MD;  Location: [facility] OR;  Service: Thoracic;  Laterality: Right;    SOCIAL HISTORY: Social History   Socioeconomic History   Marital status: Widowed    Spouse name: Not on file   Number of children: 2   Years of education: Not on file   Highest education level: Not on file  Occupational History   Not on file  Tobacco Use   Smoking status: Every Day    Current packs/day: 0.00    Average packs/day: 1 pack/day for 48.0 years (48.0 ttl pk-yrs)    Types: Cigarettes    Start date: 03/23/1974    Last attempt to quit: 03/23/2022    Years since quitting: 1.3   Smokeless tobacco: Never   Tobacco comments:    patient refused  Vaping Use   Vaping status: Never Used  Substance and Sexual Activity   Alcohol use: No     Alcohol/week: 0.0 standard drinks of alcohol   Drug use: No   Sexual activity: Not Currently  Other Topics Concern   Not on file  Social History Narrative   Live at home with husband   Social Drivers of Corporate investment banker Strain: Low Risk  (04/24/2023)   Received from [facility]   Overall Financial Resource Strain (CARDIA)    Difficulty of Paying Living Expenses: Not hard at all  Food Insecurity: No Food Insecurity (04/24/2023)   [facility] from [facility]   Hunger Vital Sign    Within the past 12 months, you worried that your food would run out before you got the money to buy more.: Never true    Within the past 12 months, the food you bought just didn't last and you didn't have money to get more.: Never true  Transportation Needs: No  Transportation Needs (04/24/2023)   Received from [facility] - Transportation    In the past 12 months, has lack of transportation kept you from medical appointments or from getting medications?: No    Lack of Transportation (Non-Medical): No  Physical Activity: Inactive (02/06/2017)   Exercise Vital Sign    Days of Exercise per Week: 0 days    Minutes of Exercise per Session: 0 min  Stress: Stress Concern Present (02/06/2017)   [employer] of Occupational Health - Occupational Stress Questionnaire    Feeling of Stress : To some extent  Social Connections: Moderately Integrated (02/06/2017)   Social Connection and Isolation Panel    Frequency of Communication with Friends and Family: More than three times a week    Frequency of Social Gatherings with Friends and Family: Once a week    Attends Religious Services: More than 4 times per year    Active Member of [employer] or Organizations: No    Attends Banker Meetings: Never    Marital Status: Married  Catering manager Violence: Not At Risk (03/24/2022)   Humiliation, Afraid, Rape, and Kick questionnaire    Fear of  Current or Ex-Partner: No    Emotionally Abused: No    Physically Abused: No    Sexually Abused: No    FAMILY HISTORY: Family History  Problem Relation Age of Onset   Lung cancer Father    Heart Problems Father    Heart failure Mother    Breast cancer Neg Hx     ALLERGIES:  is allergic to codeine, tylenol  with codeine #3 [acetaminophen -codeine], amoxicillin, lipitor [atorvastatin], lisinopril, and penicillins.  MEDICATIONS:  Current Outpatient Medications  Medication Sig Dispense Refill   albuterol  (VENTOLIN  HFA) 108 (90 Base) MCG/ACT inhaler Inhale 1-2 puffs into the lungs every 6 (six) hours as needed for wheezing or shortness of breath.     amLODipine (NORVASC) 2.5 MG tablet Take 2.5 mg by mouth daily.     cholecalciferol (VITAMIN D3) 25 MCG (1000 UNIT) tablet Take 1,000 Units by mouth daily.     cyclobenzaprine  (FLEXERIL ) 10 MG tablet Take 10 mg by mouth 3 (three) times daily as needed for muscle spasms.     gabapentin  (NEURONTIN ) 300 MG capsule Take 1 capsule (300 mg total) by mouth 3 (three) times daily. Take 100 mg by mouth in the morning, 300 mg in the afternoon and 100 mg at bedtime. 270 capsule 1   HYDROcodone -acetaminophen  (NORCO/VICODIN) 5-325 MG tablet Take 1 tablet by mouth every 8 (eight) hours as needed for moderate pain.      insulin  NPH Human (HUMULIN N,NOVOLIN N) 100 UNIT/ML injection Inject 0.1 mLs (10 Units total) into the skin 2 (two) times daily before a meal. Use 30 units in the morning, 20 units during the day and 30 units at night. 10 mL 11   ipratropium (ATROVENT ) 0.06 % nasal spray Place 2 sprays into both nostrils in the morning and at bedtime.     metFORMIN  (GLUCOPHAGE -XR) 500 MG 24 hr tablet Take 500 mg by mouth daily with supper. 3 times a day     nadolol  (CORGARD ) 80 MG tablet Take 80 mg by mouth daily.     NONFORMULARY OR COMPOUNDED ITEM Apply 1 Pump topically 3 (three) times daily as needed (neuropathy in feet). Baclofen 2%, Diclofenac 5%, Gabapentin   6%, Tetracaine 3%     omeprazole (PRILOSEC) 40 MG capsule Take 40 mg by mouth daily.     traZODone  (  DESYREL ) 50 MG tablet Take 50 mg by mouth at bedtime.  0   glimepiride  (AMARYL ) 4 MG tablet Take 4 mg by mouth daily with breakfast. (Patient not taking: Reported on 07/26/2023)     No current facility-administered medications for this visit.    REVIEW OF SYSTEMS:   Pertinent information mentioned in HPI All other systems were reviewed with the patient and are negative.  PHYSICAL EXAMINATION: ECOG PERFORMANCE STATUS: 1 - Symptomatic but completely ambulatory  Vitals:   07/26/23 1409 07/26/23 1412  BP: (!) 142/79 134/75  Pulse: 84 82  Resp: 18   Temp: 98 F (36.7 C)    Filed Weights   07/26/23 1409  Weight: 173 lb 4.8 oz (78.6 kg)    GENERAL:alert, no distress and comfortable SKIN: skin color, texture, turgor are normal, no rashes or significant lesions EYES: normal, conjunctiva are pink and non-injected, sclera clear OROPHARYNX:no exudate, no erythema and lips, buccal mucosa, and tongue normal  NECK: supple, thyroid  normal size, non-tender, without nodularity LYMPH:  no palpable lymphadenopathy in the cervical, axillary or inguinal LUNGS: clear to auscultation and percussion with normal breathing effort HEART: regular rate & rhythm and no murmurs and no lower extremity edema ABDOMEN:abdomen soft, non-tender and normal bowel sounds Musculoskeletal:no cyanosis of digits and no clubbing  PSYCH: alert & oriented x 3 with fluent speech NEURO: no focal motor/sensory deficits  LABORATORY DATA:  I have reviewed the data as listed Lab Results  Component Value Date   WBC 4.9 07/26/2023   HGB 11.6 (L) 07/26/2023   HCT 35.4 (L) 07/26/2023   MCV 84.3 07/26/2023   PLT 78 (L) 07/26/2023   Recent Labs    07/26/23 1359  NA 134*  K 4.1  CL 102  CO2 23  GLUCOSE 188*  BUN 9  CREATININE 0.74  CALCIUM 8.6*  GFRNONAA >60  PROT 7.1  ALBUMIN  3.9  AST 33  ALT 17  ALKPHOS 119   BILITOT 1.2    RADIOGRAPHIC STUDIES: I have personally reviewed the radiological images as listed and agreed with the findings in the report. CT Chest W Contrast Result Date: 07/10/2023 EXAM: CT CHEST WITH CONTRAST 07/10/2023 02:57:41 PM TECHNIQUE: CT of the chest was performed with the administration of intravenous contrast. Multiplanar reformatted images are provided for review. Automated exposure control, iterative reconstruction, and/or weight based adjustment of the mA/kV was utilized to reduce the radiation dose to as low as reasonably achievable. COMPARISON: 01/18/2023 CLINICAL HISTORY: Lung cancer. F/U RLL Lobe CA diagnosed in 01/2022. Pt states she had RLL Segmentectomy on 03/23/2022. She is currently asymptomatic. NKI Current smoker x 52 years, 1 pack a day. FINDINGS: MEDIASTINUM: Stable bilateral thyroid  nodules measuring up to 1.5 cm on the left, previously evaluated on thyroid  ultrasound. Calcified subcarinal and right perihilar nodes. Small mediastinal nodes, measuring up to 12 mm in the low right paratracheal region, unchanged. LYMPH NODES: No axillary adenopathy. LUNGS AND PLEURA: Mild centrilobular and paraseptal emphysematous changes, upper lung predominant. 2.5 x 1.0 cm mildly thick walled lung cyst in the lateral left lower lobe adjacent to the fissure, grossly unchanged. Mild subpleural reticulation/fibrosis in the lower lobes, suggesting superimposed mild chronic lung disease. Status post right lower lobe wedge resection with associated anterior scarring/atelectasis. Mild endobronchial debris in the right lower lobe. 11 mm subpleural patchy/nodular opacity in the medial right upper lobe, more conspicuous than on the prior study. 12 mm irregular subpleural nodule on the anterior left upper lobe, progressive. Additional small bilateral pulmonary nodules measuring  up to 3 mm in the left upper lobe. SOFT TISSUES/BONES: No acute abnormality of the bones or soft tissues. UPPER ABDOMEN: Mild  right paraesophageal fluid, chronic. Cirrhosis, splenomegaly, and prior cholecystectomy. IMPRESSION: 1. Status post right lower lobe wedge resection. 2. Progressive subpleural nodularity in the bilateral upper lobes, measuring 11-12 mm. Follow-up CT chest is suggested in 3-6 months. 3. Stable thick-walled lung cyst in the lateral left lower lobe, grossly unchanged. Electronically signed by: Pinkie Pebbles MD 07/10/2023 11:44 PM EDT RP Workstation: [id number]

## 2023-07-30 DIAGNOSIS — H2513 Age-related nuclear cataract, bilateral: Secondary | ICD-10-CM | POA: Diagnosis not present

## 2023-07-30 DIAGNOSIS — H25013 Cortical age-related cataract, bilateral: Secondary | ICD-10-CM | POA: Diagnosis not present

## 2023-07-30 DIAGNOSIS — E119 Type 2 diabetes mellitus without complications: Secondary | ICD-10-CM | POA: Diagnosis not present

## 2023-07-31 ENCOUNTER — Ambulatory Visit
Admission: RE | Admit: 2023-07-31 | Discharge: 2023-07-31 | Disposition: A | Discharge status: Home / Self Care | Source: Ambulatory Visit | Attending: Oncology | Admitting: Oncology

## 2023-07-31 DIAGNOSIS — C3431 Malignant neoplasm of lower lobe, right bronchus or lung: Secondary | ICD-10-CM

## 2023-08-01 ENCOUNTER — Ambulatory Visit
Admission: RE | Admit: 2023-08-01 | Discharge: 2023-08-01 | Disposition: A | Discharge status: Home / Self Care | Source: Ambulatory Visit | Attending: Student in an Organized Health Care Education/Training Program | Admitting: Student in an Organized Health Care Education/Training Program

## 2023-08-01 ENCOUNTER — Encounter: Payer: Self-pay | Admitting: Student in an Organized Health Care Education/Training Program

## 2023-08-01 ENCOUNTER — Ambulatory Visit (HOSPITAL_BASED_OUTPATIENT_CLINIC_OR_DEPARTMENT_OTHER): Admitting: Student in an Organized Health Care Education/Training Program

## 2023-08-01 DIAGNOSIS — G894 Chronic pain syndrome: Secondary | ICD-10-CM | POA: Insufficient documentation

## 2023-08-01 DIAGNOSIS — M5136 Other intervertebral disc degeneration, lumbar region with discogenic back pain only: Secondary | ICD-10-CM

## 2023-08-01 DIAGNOSIS — M47816 Spondylosis without myelopathy or radiculopathy, lumbar region: Secondary | ICD-10-CM | POA: Diagnosis not present

## 2023-08-01 MED ORDER — DEXAMETHASONE SODIUM PHOSPHATE 10 MG/ML IJ SOLN
20.0000 mg | Freq: Once | INTRAMUSCULAR | Status: AC
  Administered 2023-08-01: 20 mg
  Filled 2023-08-01: qty 2

## 2023-08-01 MED ORDER — LACTATED RINGERS IV SOLN: Freq: Once | INTRAVENOUS | Status: AC

## 2023-08-01 MED ORDER — LIDOCAINE HCL 2 % IJ SOLN
20.0000 mL | Freq: Once | INTRAMUSCULAR | Status: AC
  Administered 2023-08-01: 400 mg
  Filled 2023-08-01: qty 40

## 2023-08-01 MED ORDER — HYDROCODONE-ACETAMINOPHEN 5-325 MG PO TABS: 1.0000 | ORAL_TABLET | Freq: Three times a day (TID) | ORAL | 0 refills | Status: DC | PRN

## 2023-08-01 MED ORDER — MIDAZOLAM HCL 2 MG/2ML IJ SOLN
0.5000 mg | Freq: Once | INTRAMUSCULAR | Status: AC
  Administered 2023-08-01: 2 mg via INTRAVENOUS
  Filled 2023-08-01: qty 2

## 2023-08-01 MED ORDER — ROPIVACAINE HCL 2 MG/ML IJ SOLN
18.0000 mL | Freq: Once | INTRAMUSCULAR | Status: AC
  Administered 2023-08-01: 18 mL via PERINEURAL
  Filled 2023-08-01: qty 20

## 2023-08-01 NOTE — Progress Notes (Signed)
 Safety precautions to be maintained throughout the outpatient stay will include: orient to surroundings, keep bed in low position, maintain call bell within reach at all times, provide assistance with transfer out of bed and ambulation.

## 2023-08-01 NOTE — Progress Notes (Signed)
 PROVIDER NOTE: Interpretation of information contained herein should be left to medically-trained personnel. Specific patient instructions are provided elsewhere under Patient Instructions section of medical record. This document was created in part using STT-dictation technology, any transcriptional errors that may result from this process are unintentional.  Patient: Vanessa Oneill Type: Established DOB: 29-Jul-1950 MRN: [id number] PCP: Fernande Ophelia JINNY DOUGLAS, MD  Service: Procedure DOS: 08/01/2023 Setting: Ambulatory Location: Ambulatory outpatient facility Delivery: Face-to-face Provider: Wallie Sherry, MD Specialty: Interventional Pain Management Specialty designation: 09 Location: Outpatient facility Ref. Prov.: Sherry Wallie, MD       Interventional Therapy   Type: Lumbar Facet, Medial Branch Block(s) (w/ fluoroscopic mapping) #1  Laterality: Bilateral  Level: L3, L4, and L5 Medial Branch/Dorsal Rami Level(s). Injecting these levels blocks the L3-4 and L4-5 lumbar facet joints. Imaging: Fluoroscopic guidance Spinal ([id number]) Anesthesia: Local anesthesia (1-2% Lidocaine ) Sedation: Minimal Sedation                       DOS: 08/01/2023 Performed by: Wallie Sherry, MD  Primary Purpose: Diagnostic/Therapeutic Indications: Low back pain severe enough to impact quality of life or function. 1. Lumbar facet arthropathy   2. Lumbar spondylosis   3. Degeneration of intervertebral disc of lumbar region with discogenic back pain   4. Chronic pain syndrome    NAS-11 Pain score:   Pre-procedure: 5 /10   Post-procedure: 1 /10     Position / Prep / Materials:  Position: Prone  Prep solution: ChloraPrep (2% chlorhexidine  gluconate and 70% isopropyl alcohol) Area Prepped: Posterolateral Lumbosacral Spine (Wide prep: From the lower border of the scapula down to the end of the tailbone and from flank to flank.)  Materials:  Tray: Block Needle(s):  Type: Spinal  Gauge (G): 22  Length:  3.5-in Qty: 2     H&P (Pre-op Assessment):  [name] is a 73 y.o. (year old), female patient, seen today for interventional treatment. She  has a past surgical history that includes Cholecystectomy; Abdominal hysterectomy; Esophagogastroduodenoscopy (egd) with propofol  (N/A, 07/24/2016); Appendectomy; Tonsillectomy; Esophagogastroduodenoscopy (egd) with propofol  (N/A, 11/21/2016); Esophagogastroduodenoscopy (egd) with propofol  (N/A, 02/05/2018); Colonoscopy with propofol  (N/A, 11/18/2018); Xi robotic assisted thoracoscopy- segmentectomy (Right, 03/23/2022); Intercostal nerve block (03/23/2022); Lymph node biopsy (03/23/2022); Colonoscopy with propofol  (N/A, 04/26/2022); and Esophagogastroduodenoscopy (egd) with propofol  (N/A, 04/26/2022). Ms. Tirone has a current medication list which includes the following prescription(s): albuterol , amlodipine, cholecalciferol, cyclobenzaprine , gabapentin , hydrocodone -acetaminophen , insulin  nph human, ipratropium, metformin , nadolol , NONFORMULARY OR COMPOUNDED ITEM, omeprazole, trazodone , glimepiride , [DISCONTINUED] fluticasone, and [DISCONTINUED] ropinirole , and the following Facility-Administered Medications: lactated ringers . Her primarily concern today is the Back Pain (Bilateral ) and Leg Pain (Right )  Initial Vital Signs:  Pulse/HCG Rate: 75ECG Heart Rate: 76 Temp: (!) 97.5 F (36.4 C) Resp: 18 BP: 126/84 SpO2: 96 %  BMI: Estimated body mass index is 26.63 kg/m as calculated from the following:   Height as of this encounter: 5' 7 (1.702 m).   Weight as of this encounter: 170 lb (77.1 kg).  Risk Assessment: Allergies: Reviewed. She is allergic to codeine, tylenol  with codeine #3 [acetaminophen -codeine], amoxicillin, lipitor [atorvastatin], lisinopril, and penicillins.  Allergy Precautions: None required Coagulopathies: Reviewed. None identified.  Blood-thinner therapy: None at this time Active Infection(s): Reviewed. None identified. Ms. Burchfield is  afebrile  Site Confirmation: Ms. Scifres was asked to confirm the procedure and laterality before marking the site Procedure checklist: Completed Consent: Before the procedure and under the influence of no sedative(s), amnesic(s), or anxiolytics, the patient was  informed of the treatment options, risks and possible complications. To fulfill our ethical and legal obligations, as recommended by the American Medical Association's Code of Ethics, I have informed the patient of my clinical impression; the nature and purpose of the treatment or procedure; the risks, benefits, and possible complications of the intervention; the alternatives, including doing nothing; the risk(s) and benefit(s) of the alternative treatment(s) or procedure(s); and the risk(s) and benefit(s) of doing nothing. The patient was provided information about the general risks and possible complications associated with the procedure. These may include, but are not limited to: failure to achieve desired goals, infection, bleeding, organ or nerve damage, allergic reactions, paralysis, and death. In addition, the patient was informed of those risks and complications associated to Spine-related procedures, such as failure to decrease pain; infection (i.e.: Meningitis, epidural or intraspinal abscess); bleeding (i.e.: epidural hematoma, subarachnoid hemorrhage, or any other type of intraspinal or peri-dural bleeding); organ or nerve damage (i.e.: Any type of peripheral nerve, nerve root, or spinal cord injury) with subsequent damage to sensory, motor, and/or autonomic systems, resulting in permanent pain, numbness, and/or weakness of one or several areas of the body; allergic reactions; (i.e.: anaphylactic reaction); and/or death. Furthermore, the patient was informed of those risks and complications associated with the medications. These include, but are not limited to: allergic reactions (i.e.: anaphylactic or anaphylactoid reaction(s)); adrenal  axis suppression; blood sugar elevation that in diabetics may result in ketoacidosis or comma; water retention that in patients with history of congestive heart failure may result in shortness of breath, pulmonary edema, and decompensation with resultant heart failure; weight gain; swelling or edema; medication-induced neural toxicity; particulate matter embolism and blood vessel occlusion with resultant organ, and/or nervous system infarction; and/or aseptic necrosis of one or more joints. Finally, the patient was informed that Medicine is not an exact science; therefore, there is also the possibility of unforeseen or unpredictable risks and/or possible complications that may result in a catastrophic outcome. The patient indicated having understood very clearly. We have given the patient no guarantees and we have made no promises. Enough time was given to the patient to ask questions, all of which were answered to the patient's satisfaction. Ms. Tuller has indicated that she wanted to continue with the procedure. Attestation: I, the ordering provider, attest that I have discussed with the patient the benefits, risks, side-effects, alternatives, likelihood of achieving goals, and potential problems during recovery for the procedure that I have provided informed consent. Date  Time: 08/01/2023  1:52 PM  Pre-Procedure Preparation:  Monitoring: As per clinic protocol. Respiration, ETCO2, SpO2, BP, heart rate and rhythm monitor placed and checked for adequate function Safety Precautions: Patient was assessed for positional comfort and pressure points before starting the procedure. Time-out: I initiated and conducted the Time-out before starting the procedure, as per protocol. The patient was asked to participate by confirming the accuracy of the Time Out information. Verification of the correct person, site, and procedure were performed and confirmed by me, the nursing staff, and the patient. Time-out  conducted as per Joint Commission's Universal Protocol (UP.01.01.01). Time: 1519 Start Time: 1519 hrs.  Description of Procedure:          Laterality: (see above) Targeted Levels: (see above)  Safety Precautions: Aspiration looking for blood return was conducted prior to all injections. At no point did we inject any substances, as a needle was being advanced. Before injecting, the patient was told to immediately notify me if she was experiencing any new  onset of ringing in the ears, or metallic taste in the mouth. No attempts were made at seeking any paresthesias. Safe injection practices and needle disposal techniques used. Medications properly checked for expiration dates. SDV (single dose vial) medications used. After the completion of the procedure, all disposable equipment used was discarded in the proper designated medical waste containers. Local Anesthesia: Protocol guidelines were followed. The patient was positioned over the fluoroscopy table. The area was prepped in the usual manner. The time-out was completed. The target area was identified using fluoroscopy. A 12-in long, straight, sterile hemostat was used with fluoroscopic guidance to locate the targets for each level blocked. Once located, the skin was marked with an approved surgical skin marker. Once all sites were marked, the skin (epidermis, dermis, and hypodermis), as well as deeper tissues (fat, connective tissue and muscle) were infiltrated with a small amount of a short-acting local anesthetic, loaded on a 10cc syringe with a 25G, 1.5-in  Needle. An appropriate amount of time was allowed for local anesthetics to take effect before proceeding to the next step. Local Anesthetic: Lidocaine  2.0% The unused portion of the local anesthetic was discarded in the proper designated containers. Technical description of process:  Medial Branch  Dorsal Rami Nerve Block (MBB):  Neuroanatomy note: Each lumbar facet joint receives dual  innervation from medial branches arising from the posterior primary rami at the same level and one level above. The target for each lumbar medial branch is the junction of the ipsilateral superior articular and transverse process of the lower vertebral body. (i.e.: The L4-L5 facet joint is innervated by the L4 medial branch [located at L5] and the L3 medial branch [located at L4]. Blocking the L4 Medial Branch is therefore achieved by injecting at the junction of the ipsilateral superior articular and transverse process of the lower vertebral body [L5].).  Exception: The exception to the above rule is the L5-S1 facet joint which has triple innervation requiring the L4 medial branch, as well as the L5 and the S1 Dorsal Rami(s) to be blocked to fully denervate the joint.  Under fluoroscopic guidance, a needle was inserted until contact was made with os over the target area. After negative aspiration, 2mL of the nerve block solution was injected without difficulty or complication. Paresthesia were avoided during injection. The needle(s) were removed intact and without complication.  Once the entire procedure was completed, the treated area was cleaned, making sure to leave some of the prepping solution back to take advantage of its long term bactericidal properties.         Illustration of the posterior view of the lumbar spine and the posterior neural structures. Laminae of L2 through S1 are labeled. DPRL5, dorsal primary ramus of L5; DPRS1, dorsal primary ramus of S1; DPR3, dorsal primary ramus of L3; FJ, facet (zygapophyseal) joint L3-L4; I, inferior articular process of L4; LB1, lateral branch of dorsal primary ramus of L1; IAB, inferior articular branches from L3 medial branch (supplies L4-L5 facet joint); IBP, intermediate branch plexus; MB3, medial branch of dorsal primary ramus of L3; NR3, third lumbar nerve root; S, superior articular process of L5; SAB, superior articular branches from L4 (supplies  L4-5 facet joint also); TP3, transverse process of L3.   Facet Joint Innervation (* possible contribution)  L1-2 T12, L1 (L2*)  Medial Branch  L2-3 L1, L2 (L3*)                     L3-4 L2, L3 (L4*)  L4-5 L3, L4 (L5*)                     L5-S1 L4, L5, S1                        Vitals:   08/01/23 1408 08/01/23 1518 08/01/23 1523 08/01/23 1534  BP: 126/84 (!) 144/90 126/88 127/72  Pulse: 75     Resp: 18 18 15    Temp: (!) 97.5 F (36.4 C)     TempSrc: Temporal     SpO2: 96% 98% 96% 97%  Weight: 170 lb (77.1 kg)     Height: 5' 7 (1.702 m)        End Time: 1525 hrs.  Imaging Guidance (Spinal):         Type of Imaging Technique: Fluoroscopy Guidance (Spinal) Indication(s): Fluoroscopy guidance for needle placement to enhance accuracy in procedures requiring precise needle localization for targeted delivery of medication in or near specific anatomical locations not easily accessible without such real-time imaging assistance. Exposure Time: Please see nurses notes. Contrast: None used. Fluoroscopic Guidance: I was personally present during the use of fluoroscopy. Tunnel Vision Technique used to obtain the best possible view of the target area. Parallax error corrected before commencing the procedure. Direction-depth-direction technique used to introduce the needle under continuous pulsed fluoroscopy. Once target was reached, antero-posterior, oblique, and lateral fluoroscopic projection used confirm needle placement in all planes. Images permanently stored in EMR. Interpretation: No contrast injected. I personally interpreted the imaging intraoperatively. Adequate needle placement confirmed in multiple planes. Permanent images saved into the patient's record.  Post-operative Assessment:  Post-procedure Vital Signs:  Pulse/HCG Rate: 7575 Temp: (!) 97.5 F (36.4 C) Resp: 15 BP: 127/72 SpO2: 97 %  EBL: None  Complications: No immediate post-treatment  complications observed by team, or reported by patient.  Note: The patient tolerated the entire procedure well. A repeat set of vitals were taken after the procedure and the patient was kept under observation following institutional policy, for this type of procedure. Post-procedural neurological assessment was performed, showing return to baseline, prior to discharge. The patient was provided with post-procedure discharge instructions, including a section on how to identify potential problems. Should any problems arise concerning this procedure, the patient was given instructions to immediately contact us , at any time, without hesitation. In any case, we plan to contact the patient by telephone for a follow-up status report regarding this interventional procedure.  Comments:  No additional relevant information.  Plan of Care (POC)  Orders:  Orders Placed This Encounter  Procedures   DG PAIN CLINIC C-ARM 1-60 MIN NO REPORT    Intraoperative interpretation by procedural physician at [facility].    Standing Status:   Standing    Number of Occurrences:   1    Reason for exam::   Assistance in needle guidance and placement for procedures requiring needle placement in or near specific anatomical locations not easily accessible without such assistance.    PMP checked and reviewed.  Patient to sign opioid contract.  Taking of her hydrocodone  as below. Medications ordered for procedure: Meds ordered this encounter  Medications   lidocaine  (XYLOCAINE ) 2 % (with pres) injection 400 mg   lactated ringers  infusion   midazolam  (VERSED ) injection 0.5-2 mg    Make sure Flumazenil is available in the pyxis when using this medication. If oversedation occurs, administer 0.2 mg IV over 15 sec. If after 45 sec no response,  administer 0.2 mg again over 1 min; may repeat at 1 min intervals; not to exceed 4 doses (1 mg)   ropivacaine  (PF) 2 mg/mL (0.2%) (NAROPIN ) injection 18 mL   dexamethasone   (DECADRON ) injection 20 mg   HYDROcodone -acetaminophen  (NORCO/VICODIN) 5-325 MG tablet    Sig: Take 1 tablet by mouth 3 (three) times daily as needed for severe pain (pain score 7-10). Must last 30 days    Dispense:  90 tablet    Refill:  0    Chronic Pain: STOP Act (Not applicable) Fill 1 day early if closed on refill date. Avoid benzodiazepines within 8 hours of opioids   Medications administered: We administered lidocaine , lactated ringers , midazolam , ropivacaine  (PF) 2 mg/mL (0.2%), and dexamethasone .  See the medical record for exact dosing, route, and time of administration.    B/L L3,4,5 MBNB 08/01/23    Follow-up plan:   Return in about 3 weeks (around 08/22/2023) for PPE, F2F.     Recent Visits Date Type Provider Dept  07/03/23 Office Visit Marcelino Nurse, MD [facility]  Showing recent visits within past 90 days and meeting all other requirements Today's Visits Date Type Provider Dept  08/01/23 Procedure visit Marcelino Nurse, MD [facility]  Showing today's visits and meeting all other requirements Future Appointments Date Type Provider Dept  08/06/23 Appointment Marcelino Nurse, MD [facility]  08/30/23 Appointment Marcelino Nurse, MD [facility]  Showing future appointments within next 90 days and meeting all other requirements   Disposition: Discharge home  Discharge (Date  Time): 08/01/2023; 1536 hrs.   Primary Care Physician: Fernande Ophelia JINNY DOUGLAS, MD Location: [facility] Note by: Nurse Marcelino, MD (TTS technology used. I apologize for any typographical errors that were not detected and corrected.) Date: 08/01/2023; Time: 3:43 PM  Disclaimer:  Medicine is not an Visual merchandiser. The only guarantee in medicine is that nothing is guaranteed. It is important to note that the decision to proceed with this intervention was based on the information collected from the patient. The Data and conclusions were drawn  from the patient's questionnaire, the interview, and the physical examination. Because the information was provided in large part by the patient, it cannot be guaranteed that it has not been purposely or unconsciously manipulated. Every effort has been made to obtain as much relevant data as possible for this evaluation. It is important to note that the conclusions that lead to this procedure are derived in large part from the available data. Always take into account that the treatment will also be dependent on availability of resources and existing treatment guidelines, considered by other Pain Management Practitioners as being common knowledge and practice, at the time of the intervention. For Medico-Legal purposes, it is also important to point out that variation in procedural techniques and pharmacological choices are the acceptable norm. The indications, contraindications, technique, and results of the above procedure should only be interpreted and judged by a Board-Certified Interventional Pain Specialist with extensive familiarity and expertise in the same exact procedure and technique.

## 2023-08-02 ENCOUNTER — Telehealth: Payer: Self-pay

## 2023-08-02 NOTE — Telephone Encounter (Signed)
 Post procedure follow up.  Patinet states she is having a little pain.  Instructed to put heat today and to call us  back for any further questions or concerns.

## 2023-08-06 ENCOUNTER — Ambulatory Visit
Admission: RE | Admit: 2023-08-06 | Discharge: 2023-08-06 | Disposition: A | Discharge status: Home / Self Care | Source: Ambulatory Visit | Attending: Oncology | Admitting: Oncology

## 2023-08-06 ENCOUNTER — Ambulatory Visit
Attending: Student in an Organized Health Care Education/Training Program | Admitting: Student in an Organized Health Care Education/Training Program

## 2023-08-06 DIAGNOSIS — R161 Splenomegaly, not elsewhere classified: Secondary | ICD-10-CM | POA: Insufficient documentation

## 2023-08-06 DIAGNOSIS — J439 Emphysema, unspecified: Secondary | ICD-10-CM | POA: Insufficient documentation

## 2023-08-06 DIAGNOSIS — E119 Type 2 diabetes mellitus without complications: Secondary | ICD-10-CM | POA: Insufficient documentation

## 2023-08-06 DIAGNOSIS — I700 Atherosclerosis of aorta: Secondary | ICD-10-CM | POA: Diagnosis not present

## 2023-08-06 DIAGNOSIS — I2510 Atherosclerotic heart disease of native coronary artery without angina pectoris: Secondary | ICD-10-CM | POA: Diagnosis not present

## 2023-08-06 DIAGNOSIS — K7460 Unspecified cirrhosis of liver: Secondary | ICD-10-CM | POA: Diagnosis not present

## 2023-08-06 DIAGNOSIS — C3411 Malignant neoplasm of upper lobe, right bronchus or lung: Secondary | ICD-10-CM | POA: Diagnosis not present

## 2023-08-06 DIAGNOSIS — C3431 Malignant neoplasm of lower lobe, right bronchus or lung: Secondary | ICD-10-CM | POA: Insufficient documentation

## 2023-08-06 LAB — GLUCOSE, CAPILLARY: Glucose-Capillary: 127 mg/dL — ABNORMAL HIGH (ref 70–99)

## 2023-08-06 MED ORDER — FLUDEOXYGLUCOSE F - 18 (FDG) INJECTION
8.8000 | Freq: Once | INTRAVENOUS | Status: AC | PRN
  Administered 2023-08-06: 9.27 via INTRAVENOUS

## 2023-08-12 ENCOUNTER — Ambulatory Visit: Payer: Self-pay | Admitting: Oncology

## 2023-08-13 ENCOUNTER — Ambulatory Visit
Attending: Student in an Organized Health Care Education/Training Program | Admitting: Student in an Organized Health Care Education/Training Program

## 2023-08-13 VITALS — BP 133/74 | HR 68 | Temp 97.4°F | Resp 16 | Ht 67.0 in | Wt 170.0 lb

## 2023-08-13 DIAGNOSIS — G894 Chronic pain syndrome: Secondary | ICD-10-CM | POA: Insufficient documentation

## 2023-08-13 DIAGNOSIS — M47816 Spondylosis without myelopathy or radiculopathy, lumbar region: Secondary | ICD-10-CM | POA: Diagnosis not present

## 2023-08-13 DIAGNOSIS — E1140 Type 2 diabetes mellitus with diabetic neuropathy, unspecified: Secondary | ICD-10-CM | POA: Insufficient documentation

## 2023-08-13 MED ORDER — CAPSAICIN-CLEANSING GEL 8 % EX KIT
4.0000 | PACK | Freq: Once | CUTANEOUS | Status: AC
  Administered 2023-08-13 (×2): 4 via TOPICAL
  Filled 2023-08-13: qty 4

## 2023-08-13 NOTE — Progress Notes (Signed)
 PROVIDER NOTE: Interpretation of information contained herein should be left to medically-trained personnel. Specific patient instructions are provided elsewhere under Patient Instructions section of medical record. This document was created in part using STT-dictation technology, any transcriptional errors that may result from this process are unintentional.  Patient: Vanessa Oneill Type: Established DOB: 1950-03-16 MRN: [id number] PCP: Fernande Ophelia JINNY DOUGLAS, MD  Service: Procedure DOS: 08/13/2023 Setting: Ambulatory Location: Ambulatory outpatient facility Delivery: Face-to-face Provider: Wallie Sherry, MD Specialty: Interventional Pain Management Specialty designation: 09 Location: Outpatient facility Ref. Prov.: Fernande Ophelia JINNY DOUGLAS, MD       Interventional Therapy   Type: Qutenza  Neurolysis #1  Laterality:  Bilateral Area treated: Feet Imaging Guidance: None Anesthesia/analgesia/anxiolysis/sedation: None required Medication (Right): Qutenza  (capsaicin  8%) topical system Medication (Left): Qutenza  (capsaicin  8%) topical system Date: 08/13/2023 Performed by: Wallie Sherry, MD Rationale (medical necessity): procedure needed and proper for the treatment of Vanessa Oneill's medical symptoms and needs. Indication: Painful diabetic peripheral neuralgia (DPN) (ICD-10-CM:E11.40) severe enough to impact quality of life or function. 1. Chronic painful diabetic neuropathy (HCC)   2. Lumbar facet arthropathy   3. Lumbar spondylosis   4. Chronic pain syndrome    NAS-11 Pain score:   Pre-procedure: 3 /10   Post-procedure: 3 /10     Position / Prep / Materials:  Position: Supine  Materials: Qutenza  Kit (4 patches)  H&P (Pre-op Assessment):  Vanessa Oneill is a 73 y.o. (year old), female patient, seen today for interventional treatment. She  has a past surgical history that includes Cholecystectomy; Abdominal hysterectomy; Esophagogastroduodenoscopy (egd) with propofol  (N/A, 07/24/2016);  Appendectomy; Tonsillectomy; Esophagogastroduodenoscopy (egd) with propofol  (N/A, 11/21/2016); Esophagogastroduodenoscopy (egd) with propofol  (N/A, 02/05/2018); Colonoscopy with propofol  (N/A, 11/18/2018); Xi robotic assisted thoracoscopy- segmentectomy (Right, 03/23/2022); Intercostal nerve block (03/23/2022); Lymph node biopsy (03/23/2022); Colonoscopy with propofol  (N/A, 04/26/2022); and Esophagogastroduodenoscopy (egd) with propofol  (N/A, 04/26/2022). Vanessa Oneill has a current medication list which includes the following prescription(s): albuterol , amlodipine, cholecalciferol, cyclobenzaprine , gabapentin , glimepiride , hydrocodone -acetaminophen , insulin  nph human, ipratropium, metformin , nadolol , NONFORMULARY OR COMPOUNDED ITEM, omeprazole, trazodone , [DISCONTINUED] fluticasone, and [DISCONTINUED] ropinirole . Her primarily concern today is the foot  pain (bilateral)  Initial Vital Signs:  Pulse/HCG Rate: 68  Temp: (!) 97.4 F (36.3 C) Resp: 16 BP: 133/74 SpO2: 97 %  BMI: Estimated body mass index is 26.63 kg/m as calculated from the following:   Height as of this encounter: 5' 7 (1.702 m).   Weight as of this encounter: 170 lb (77.1 kg).  Risk Assessment: Allergies: Reviewed. She is allergic to codeine, tylenol  with codeine #3 [acetaminophen -codeine], amoxicillin, lipitor [atorvastatin], lisinopril, and penicillins.  Allergy Precautions: None required Coagulopathies: Reviewed. None identified.  Blood-thinner therapy: None at this time Active Infection(s): Reviewed. None identified. Vanessa Oneill is afebrile  Site Confirmation: Vanessa Oneill was asked to confirm the procedure and laterality before marking the site Procedure checklist: Completed Consent: Before the procedure and under the influence of no sedative(s), amnesic(s), or anxiolytics, the patient was informed of the treatment options, risks and possible complications. To fulfill our ethical and legal obligations, as recommended by  the American Medical Association's Code of Ethics, I have informed the patient of my clinical impression; the nature and purpose of the treatment or procedure; the risks, benefits, and possible complications of the intervention; the alternatives, including doing nothing; the risk(s) and benefit(s) of the alternative treatment(s) or procedure(s); and the risk(s) and benefit(s) of doing nothing. The patient was provided information about the general risks and possible complications associated with the procedure.  These may include, but are not limited to: failure to achieve desired goals, infection, bleeding, organ or nerve damage, allergic reactions, paralysis, and death. In addition, the patient was informed of those risks and complications associated to the procedure, such as failure to decrease pain; infection; bleeding; organ or nerve damage with subsequent damage to sensory, motor, and/or autonomic systems, resulting in permanent pain, numbness, and/or weakness of one or several areas of the body; allergic reactions; (i.e.: anaphylactic reaction); and/or death. Furthermore, the patient was informed of those risks and complications associated with the medications. These include, but are not limited to: allergic reactions (i.e.: anaphylactic or anaphylactoid reaction(s)); adrenal axis suppression; blood sugar elevation that in diabetics may result in ketoacidosis or comma; water retention that in patients with history of congestive heart failure may result in shortness of breath, pulmonary edema, and decompensation with resultant heart failure; weight gain; swelling or edema; medication-induced neural toxicity; particulate matter embolism and blood vessel occlusion with resultant organ, and/or nervous system infarction; and/or aseptic necrosis of one or more joints. Finally, the patient was informed that Medicine is not an exact science; therefore, there is also the possibility of unforeseen or unpredictable  risks and/or possible complications that may result in a catastrophic outcome. The patient indicated having understood very clearly. We have given the patient no guarantees and we have made no promises. Enough time was given to the patient to ask questions, all of which were answered to the patient's satisfaction. Ms. Landing has indicated that she wanted to continue with the procedure. Attestation: I, the ordering provider, attest that I have discussed with the patient the benefits, risks, side-effects, alternatives, likelihood of achieving goals, and potential problems during recovery for the procedure that I have provided informed consent. Date  Time: 08/13/2023  1:29 PM  Pre-Procedure Preparation:  Monitoring: As per clinic protocol. Respiration, ETCO2, SpO2, BP, heart rate and rhythm monitor placed and checked for adequate function Safety Precautions: Patient was assessed for positional comfort and pressure points before starting the procedure. Time-out: I initiated and conducted the Time-out before starting the procedure, as per protocol. The patient was asked to participate by confirming the accuracy of the Time Out information. Verification of the correct person, site, and procedure were performed and confirmed by me, the nursing staff, and the patient. Time-out conducted as per Joint Commission's Universal Protocol (UP.01.01.01). Time: 1400 Start Time: 1400 hrs.  Description/Narrative of Procedure:          Region: Distal lower extremities Target Area: Sensory peripheral nerves affected by diabetic peripheral neuropathy Site: Feet Approach: Percutaneous  No./Series: Not applicable  Type: Percutaneous  Purpose: Therapeutic  Region: Distal lower extremities  Start Time: 1400 hrs.  Description of the Procedure: Protocol guidelines were followed. The patient was assisted into a comfortable position.  Informed consent was obtained in the patient monitored in the usual manner.  All  questions were answered prior to the procedure.  They Qutenza  patches were applied to the affected area and then covered with the wrap.  The Patient was kept under observation until the treatment was completed.  The patches were removed and the treated area was inspected.  Vitals:   08/13/23 1335  BP: 133/74  Pulse: 68  Resp: 16  Temp: (!) 97.4 F (36.3 C)  SpO2: 97%  Weight: 170 lb (77.1 kg)  Height: 5' 7 (1.702 m)     End Time: 1440 hrs.  Type of Imaging Technique: None used Indication(s): N/A Exposure Time: No patient exposure  Contrast: None used. Fluoroscopic Guidance: N/A Ultrasound Guidance: N/A Interpretation: N/A  Post-operative Assessment:  Post-procedure Vital Signs:  Pulse/HCG Rate: 68  Temp: (!) 97.4 F (36.3 C) Resp: 16 BP: 133/74 SpO2: 97 %  EBL: None  Complications: No immediate post-treatment complications observed by team, or reported by patient.  Note: The patient tolerated the entire procedure well. A repeat set of vitals were taken after the procedure and the patient was kept under observation following institutional policy, for this type of procedure. Post-procedural neurological assessment was performed, showing return to baseline, prior to discharge. The patient was provided with post-procedure discharge instructions, including a section on how to identify potential problems. Should any problems arise concerning this procedure, the patient was given instructions to immediately contact us , at any time, without hesitation. In any case, we plan to contact the patient by telephone for a follow-up status report regarding this interventional procedure.  Comments:  No additional relevant information.  Plan of Care (POC)  Orders:   Post-Procedure Evaluation   Type: Lumbar Facet, Medial Branch Block(s) (w/ fluoroscopic mapping) #1  Laterality: Bilateral  Level: L3, L4, and L5 Medial Branch/Dorsal Rami Level(s). Injecting these levels blocks the L3-4 and  L4-5 lumbar facet joints. Imaging: Fluoroscopic guidance Spinal ([id number]) Anesthesia: Local anesthesia (1-2% Lidocaine ) Sedation: Minimal Sedation                       DOS: 08/01/2023 Performed by: Wallie Sherry, MD  Primary Purpose: Diagnostic/Therapeutic Indications: Low back pain severe enough to impact quality of life or function. 1. Lumbar facet arthropathy   2. Lumbar spondylosis   3. Degeneration of intervertebral disc of lumbar region with discogenic back pain   4. Chronic pain syndrome    NAS-11 Pain score:   Pre-procedure: 5 /10   Post-procedure: 1 /10     Effectiveness:  Initial hour after procedure: 100 %  Subsequent 4-6 hours post-procedure: 100 %  Analgesia past initial 6 hours: 80% for 1 week then decreased to 50% Ongoing improvement:  Analgesic:  50% Function: Somewhat improved ROM: Somewhat improved  The patient is status post positive diagnostic lumbar medial branch nerve block (Block #1) at bilateral L3, L4, and L5 levels, with significant temporary pain relief, consistent with facet-mediated low back pain.  We will proceed with a second diagnostic medial branch block at the same levels to confirm the diagnosis, as per standard protocol. If the patient experiences similar positive response with Block #2, we will then consider proceeding with lumbar medial branch radiofrequency ablation (RFA) for longer-term pain relief.  The patient was counseled on the rationale, goals, and expected outcomes of repeat diagnostic blocks and RFA, and agrees with the plan. Follow-up will be scheduled accordingly.    Orders Placed This Encounter  Procedures   LUMBAR FACET(MEDIAL BRANCH NERVE BLOCK) MBNB    Diagnosis: Lumbar Facet Syndrome (M47.816); Lumbosacral Facet Syndrome (M47.817); Lumbar Facet Joint Pain (M54.59) Medical Necessity Statement: 1.Severe chronic axial low back pain causing functional impairment documented by ongoing pain scale assessments. 2.Pain present  for longer than 3 months (Chronic) documented to have failed noninvasive conservative therapies. 3.Absence of untreated radiculopathy. 4.There is no radiological evidence of untreated fractures, tumor, infection, or deformity.  Physical Examination Findings: Positive Kemp Maneuver: (Y)  Positive Lumbar Hyperextension-Rotation provocative test: (Y)    Standing Status:   Future    Expiration Date:   11/13/2023    Scheduling Instructions:     Procedure: Lumbar facet Block  Type: Medial Branch Block     Side: Bilateral     Purpose: Diagnostic Radiologic Mapping     Level(s): L3-4, L4-5,  by Fluoroscopic Mapping Facets ( L3, L4, L5, Medial Branch)     Sedation: Patient's choice.     Timeframe: As soon as schedule allows.    Where will this procedure be performed?:   [facility]     Medications ordered for procedure: Meds ordered this encounter  Medications   capsaicin  topical system 8 % patch 4 patch    Disregard order if transmitted to pharmacy. Qutenza  Kit to be used from [facility].   Medications administered: We administered capsaicin  topical system.  See the medical record for exact dosing, route, and time of administration.    B/L L3,4,5 MBNB 08/01/23     Follow-up plan:   Return in about 1 week (around 08/20/2023) for B/L L3, 4, 5 MBNB #2 , in clinic IV Versed .     Recent Visits Date Type Provider Dept  08/01/23 Procedure visit Marcelino Nurse, MD [facility]  07/03/23 Office Visit Marcelino Nurse, MD [facility]  Showing recent visits within past 90 days and meeting all other requirements Today's Visits Date Type Provider Dept  08/13/23 Procedure visit Marcelino Nurse, MD [facility]  Showing today's visits and meeting all other requirements Future Appointments Date Type Provider Dept  09/11/23 Appointment Marcelino Nurse, MD [facility]  Showing future appointments within next 90 days and meeting all  other requirements   Disposition: Discharge home  Discharge (Date  Time): 08/13/2023; 1452 hrs.   Primary Care Physician: Fernande Ophelia JINNY DOUGLAS, MD Location: [facility] Note by: Nurse Marcelino, MD (TTS technology used. I apologize for any typographical errors that were not detected and corrected.) Date: 08/13/2023; Time: 2:57 PM  Disclaimer:  Medicine is not an Visual merchandiser. The only guarantee in medicine is that nothing is guaranteed. It is important to note that the decision to proceed with this intervention was based on the information collected from the patient. The Data and conclusions were drawn from the patient's questionnaire, the interview, and the physical examination. Because the information was provided in large part by the patient, it cannot be guaranteed that it has not been purposely or unconsciously manipulated. Every effort has been made to obtain as much relevant data as possible for this evaluation. It is important to note that the conclusions that lead to this procedure are derived in large part from the available data. Always take into account that the treatment will also be dependent on availability of resources and existing treatment guidelines, considered by other Pain Management Practitioners as being common knowledge and practice, at the time of the intervention. For Medico-Legal purposes, it is also important to point out that variation in procedural techniques and pharmacological choices are the acceptable norm. The indications, contraindications, technique, and results of the above procedure should only be interpreted and judged by a Board-Certified Interventional Pain Specialist with extensive familiarity and expertise in the same exact procedure and technique.

## 2023-08-13 NOTE — Progress Notes (Signed)
 Safety precautions to be maintained throughout the outpatient stay will include: orient to surroundings, keep bed in low position, maintain call bell within reach at all times, provide assistance with transfer out of bed and ambulation.

## 2023-08-13 NOTE — Patient Instructions (Addendum)
 ______________________________________________________________________    Preparing for your procedure  Appointments: If you think you may not be able to keep your appointment, call 24-48 hours in advance to cancel. We need time to make it available to others.  Procedure visits are for procedures only. During your procedure appointment there will be: NO Prescription Refills*. NO medication changes or discussions*. NO discussion of disability issues*. NO unrelated pain problem evaluations*. NO evaluations to order other pain procedures*. *These will be addressed at a separate and distinct evaluation encounter on the provider's evaluation schedule and not during procedure days.  Instructions: Food intake: Avoid eating anything solid for at least 8 hours prior to your procedure. Clear liquid intake: You may take clear liquids such as water up to 2 hours prior to your procedure. (No carbonated drinks. No soda.) Transportation: Unless otherwise stated by your physician, bring a driver. (Driver cannot be a Market researcher, Pharmacist, community, or any other form of public transportation.) Morning Medicines: Except for blood thinners, take all of your other morning medications with a sip of water. Make sure to take your heart and blood pressure medicines. If your blood pressure's lower number is above 100, the case will be rescheduled. Blood thinners: Make sure to stop your blood thinners as instructed.  If you take a blood thinner, but were not instructed to stop it, call our office [phone] and ask to talk to a nurse. Not stopping a blood thinner prior to certain procedures could lead to serious complications. Diabetics on insulin: Notify the staff so that you can be scheduled 1st case in the morning. If your diabetes requires high dose insulin, take only  of your normal insulin dose the morning of the procedure and notify the staff that you have done so. Preventing infections: Shower with an antibacterial soap the  morning of your procedure.  Build-up your immune system: Take 1000 mg of Vitamin C with every meal (3 times a day) the day prior to your procedure. Antibiotics: Inform the nursing staff if you are taking any antibiotics or if you have any conditions that may require antibiotics prior to procedures. (Example: recent joint implants)   Pregnancy: If you are pregnant make sure to notify the nursing staff. Not doing so may result in injury to the fetus, including death.  Sickness: If you have a cold, fever, or any active infections, call and cancel or reschedule your procedure. Receiving steroids while having an infection may result in complications. Arrival: You must be in the facility at least 30 minutes prior to your scheduled procedure. Tardiness: Your scheduled time is also the cutoff time. If you do not arrive at least 15 minutes prior to your procedure, you will be rescheduled.  Children: Do not bring any children with you. Make arrangements to keep them home. Dress appropriately: There is always a possibility that your clothing may get soiled. Avoid long dresses. Valuables: Do not bring any jewelry or valuables.  Reasons to call and reschedule or cancel your procedure: (Following these recommendations will minimize the risk of a serious complication.) Surgeries: Avoid having procedures within 2 weeks of any surgery. (Avoid for 2 weeks before or after any surgery). Flu Shots: Avoid having procedures within 2 weeks of a flu shots or . (Avoid for 2 weeks before or after immunizations). Barium: Avoid having a procedure within 7-10 days after having had a radiological study involving the use of radiological contrast. (Myelograms, Barium swallow or enema study). Heart attacks: Avoid any elective procedures or surgeries for the  initial 6 months after a Myocardial Infarction (Heart Attack). Blood thinners: It is imperative that you stop these medications before procedures. Let us  know if you if you take  any blood thinner.  Infection: Avoid procedures during or within two weeks of an infection (including chest colds or gastrointestinal problems). Symptoms associated with infections include: Localized redness, fever, chills, night sweats or profuse sweating, burning sensation when voiding, cough, congestion, stuffiness, runny nose, sore throat, diarrhea, nausea, vomiting, cold or Flu symptoms, recent or current infections. It is specially important if the infection is over the area that we intend to treat. Heart and lung problems: Symptoms that may suggest an active cardiopulmonary problem include: cough, chest pain, breathing difficulties or shortness of breath, dizziness, ankle swelling, uncontrolled high or unusually low blood pressure, and/or palpitations. If you are experiencing any of these symptoms, cancel your procedure and contact your primary care physician for an evaluation.  Remember:  Regular Business hours are:  Monday to Thursday 8:00 AM to 4:00 PM  Provider's Schedule: Eric Como, MD:  Procedure days: Tuesday and Thursday 7:30 AM to 4:00 PM  Wallie Sherry, MD:  Procedure days: Monday and Wednesday 7:30 AM to 4:00 PM Last  Updated: 12/12/2022 ______________________________________________________________________     ______________________________________________________________________    Post-Procedure Discharge Instructions  Instructions: Apply ice:  Purpose: This will minimize any swelling and discomfort after procedure.  When: Day of procedure, as soon as you get home. How: Fill a plastic sandwich bag with crushed ice. Cover it with a small towel and apply to injection site. How long: (15 min on, 15 min off) Apply for 15 minutes then remove x 15 minutes.  Repeat sequence on day of procedure, until you go to bed. Apply heat:  Purpose: To treat any soreness and discomfort from the procedure. When: Starting the next day after the procedure. How: Apply heat to  procedure site starting the day following the procedure. How long: May continue to repeat daily, until discomfort goes away. Food intake: Start with clear liquids (like water) and advance to regular food, as tolerated.  Physical activities: Keep activities to a minimum for the first 8 hours after the procedure. After that, then as tolerated. Driving: If you have received any sedation, be responsible and do not drive. You are not allowed to drive for 24 hours after having sedation. Blood thinner: (Applies only to those taking blood thinners) You may restart your blood thinner 6 hours after your procedure. Insulin: (Applies only to Diabetic patients taking insulin) As soon as you can eat, you may resume your normal dosing schedule. Infection prevention: Keep procedure site clean and dry. Shower daily and clean area with soap and water. Post-procedure Pain Diary: Extremely important that this be done correctly and accurately. Recorded information will be used to determine the next step in treatment. For the purpose of accuracy, follow these rules: Evaluate only the area treated. Do not report or include pain from an untreated area. For the purpose of this evaluation, ignore all other areas of pain, except for the treated area. After your procedure, avoid taking a long nap and attempting to complete the pain diary after you wake up. Instead, set your alarm clock to go off every hour, on the hour, for the initial 8 hours after the procedure. Document the duration of the numbing medicine, and the relief you are getting from it. Do not go to sleep and attempt to complete it later. It will not be accurate. If you received sedation, it  is likely that you were given a medication that may cause amnesia. Because of this, completing the diary at a later time may cause the information to be inaccurate. This information is needed to plan your care. Follow-up appointment: Keep your post-procedure follow-up evaluation  appointment after the procedure (usually 2 weeks for most procedures, 6 weeks for radiofrequencies). DO NOT FORGET to bring you pain diary with you.   Expect: (What should I expect to see with my procedure?) From numbing medicine (AKA: Local Anesthetics): Numbness or decrease in pain. You may also experience some weakness, which if present, could last for the duration of the local anesthetic. Onset: Full effect within 15 minutes of injected. Duration: It will depend on the type of local anesthetic used. On the average, 1 to 8 hours.  From steroids (Applies only if steroids were used): Decrease in swelling or inflammation. Once inflammation is improved, relief of the pain will follow. Onset of benefits: Depends on the amount of swelling present. The more swelling, the longer it will take for the benefits to be seen. In some cases, up to 10 days. Duration: Steroids will stay in the system x 2 weeks. Duration of benefits will depend on multiple posibilities including persistent irritating factors. Side-effects: If present, they may typically last 2 weeks (the duration of the steroids). Frequent: Cramps (if they occur, drink Gatorade and take over-the-counter Magnesium  450-500 mg once to twice a day); water retention with temporary weight gain; increases in blood sugar; decreased immune system response; increased appetite. Occasional: Facial flushing (red, warm cheeks); mood swings; menstrual changes. Uncommon: Long-term decrease or suppression of natural hormones; bone thinning. (These are more common with higher doses or more frequent use. This is why we prefer that our patients avoid having any injection therapies in other practices.)  Very Rare: Severe mood changes; psychosis; aseptic necrosis. From procedure: Some discomfort is to be expected once the numbing medicine wears off. This should be minimal if ice and heat are applied as instructed.  Call if: (When should I call?) You experience numbness  and weakness that gets worse with time, as opposed to wearing off. New onset bowel or bladder incontinence. (Applies only to procedures done in the spine)  Emergency Numbers: Durning business hours (Monday - Thursday, 8:00 AM - 4:00 PM) (Friday, 9:00 AM - 12:00 Noon): [phone] After hours: [phone] NOTE: If you are having a problem and are unable connect with, or to talk to a provider, then go to your nearest urgent care or emergency department. If the problem is serious and urgent, please call 911. ______________________________________________________________________

## 2023-08-14 ENCOUNTER — Telehealth: Payer: Self-pay | Admitting: *Deleted

## 2023-08-14 ENCOUNTER — Encounter: Payer: Self-pay | Admitting: *Deleted

## 2023-08-14 NOTE — Progress Notes (Signed)
 Contacted pt to review PET scan results and MD recommendations. All questions answered during call. Informed that will follow up with Dr. Babara based on pulmonary recommendations. Instructed pt to call back with any questions or needs. Pt is scheduled to see Dr. Parris on 8/27 at 1:15pm. Nothing further needed at this time.

## 2023-08-14 NOTE — Telephone Encounter (Signed)
 Post procedure call:   no  questions or concerns.

## 2023-08-22 ENCOUNTER — Ambulatory Visit (HOSPITAL_BASED_OUTPATIENT_CLINIC_OR_DEPARTMENT_OTHER): Admitting: Student in an Organized Health Care Education/Training Program

## 2023-08-22 ENCOUNTER — Encounter: Payer: Self-pay | Admitting: Student in an Organized Health Care Education/Training Program

## 2023-08-22 ENCOUNTER — Ambulatory Visit
Admission: RE | Admit: 2023-08-22 | Discharge: 2023-08-22 | Disposition: A | Discharge status: Home / Self Care | Source: Ambulatory Visit | Attending: Student in an Organized Health Care Education/Training Program | Admitting: Student in an Organized Health Care Education/Training Program

## 2023-08-22 DIAGNOSIS — M47816 Spondylosis without myelopathy or radiculopathy, lumbar region: Secondary | ICD-10-CM | POA: Insufficient documentation

## 2023-08-22 DIAGNOSIS — G894 Chronic pain syndrome: Secondary | ICD-10-CM | POA: Insufficient documentation

## 2023-08-22 MED ORDER — DEXAMETHASONE SODIUM PHOSPHATE 10 MG/ML IJ SOLN
INTRAMUSCULAR | Status: AC
  Filled 2023-08-22: qty 1

## 2023-08-22 MED ORDER — DEXAMETHASONE SODIUM PHOSPHATE 10 MG/ML IJ SOLN
20.0000 mg | Freq: Once | INTRAMUSCULAR | Status: AC
  Administered 2023-08-22: 20 mg

## 2023-08-22 MED ORDER — ROPIVACAINE HCL 2 MG/ML IJ SOLN
INTRAMUSCULAR | Status: AC
  Filled 2023-08-22: qty 20

## 2023-08-22 MED ORDER — MIDAZOLAM HCL 2 MG/2ML IJ SOLN
0.5000 mg | Freq: Once | INTRAMUSCULAR | Status: AC
  Administered 2023-08-22: 2 mg via INTRAVENOUS

## 2023-08-22 MED ORDER — HYDROCODONE-ACETAMINOPHEN 5-325 MG PO TABS: 1.0000 | ORAL_TABLET | Freq: Three times a day (TID) | ORAL | 0 refills | Status: AC | PRN

## 2023-08-22 MED ORDER — MIDAZOLAM HCL 2 MG/2ML IJ SOLN
INTRAMUSCULAR | Status: AC
Start: 2023-08-22 — End: 2023-08-22
  Filled 2023-08-22: qty 2

## 2023-08-22 MED ORDER — LIDOCAINE HCL (PF) 2 % IJ SOLN
INTRAMUSCULAR | Status: AC
  Filled 2023-08-22: qty 20

## 2023-08-22 MED ORDER — ROPIVACAINE HCL 2 MG/ML IJ SOLN
18.0000 mL | Freq: Once | INTRAMUSCULAR | Status: AC
  Administered 2023-08-22: 18 mL via PERINEURAL

## 2023-08-22 MED ORDER — LACTATED RINGERS IV SOLN: Freq: Once | INTRAVENOUS | Status: AC

## 2023-08-22 MED ORDER — LIDOCAINE HCL 2 % IJ SOLN
20.0000 mL | Freq: Once | INTRAMUSCULAR | Status: AC
  Administered 2023-08-22: 400 mg

## 2023-08-22 NOTE — Patient Instructions (Signed)

## 2023-08-22 NOTE — Progress Notes (Signed)
 PROVIDER NOTE: Interpretation of information contained herein should be left to medically-trained personnel. Specific patient instructions are provided elsewhere under Patient Instructions section of medical record. This document was created in part using STT-dictation technology, any transcriptional errors that may result from this process are unintentional.  Patient: Vanessa Oneill Type: Established DOB: [birth year] MRN: [id number] PCP: Fernande Ophelia JINNY DOUGLAS, MD  Service: Procedure DOS: 08/22/2023 Setting: Ambulatory Location: Ambulatory outpatient facility Delivery: Face-to-face Provider: Wallie Sherry, MD Specialty: Interventional Pain Management Specialty designation: 09 Location: Outpatient facility Ref. Prov.: Sherry Wallie, MD       Interventional Therapy   Type: Lumbar Facet, Medial Branch Block(s) (w/ fluoroscopic mapping) #2  Laterality: Bilateral  Level: L3, L4, and L5 Medial Branch/Dorsal Rami Level(s). Injecting these levels blocks the L3-4 and L4-5 lumbar facet joints. Imaging: Fluoroscopic guidance Spinal ([id number]) Anesthesia: Local anesthesia (1-2% Lidocaine ) Sedation:                         DOS: 08/22/2023 Performed by: Wallie Sherry, MD  Primary Purpose: Diagnostic/Therapeutic Indications: Low back pain severe enough to impact quality of life or function. 1. Lumbar facet arthropathy   2. Lumbar spondylosis   3. Chronic pain syndrome    NAS-11 Pain score:   Pre-procedure: 7 /10   Post-procedure: 7 /10     Position / Prep / Materials:  Position: Prone  Prep solution: ChloraPrep (2% chlorhexidine  gluconate and 70% isopropyl alcohol) Area Prepped: Posterolateral Lumbosacral Spine (Wide prep: From the lower border of the scapula down to the end of the tailbone and from flank to flank.)  Materials:  Tray: Block Needle(s):  Type: Spinal  Gauge (G): 22  Length: 3.5-in Qty: 2     H&P (Pre-op Assessment):  Vanessa Oneill is a 73 y.o. (year old), female patient,  seen today for interventional treatment. She  has a past surgical history that includes Cholecystectomy; Abdominal hysterectomy; Esophagogastroduodenoscopy (egd) with propofol  (N/A, 07/24/2016); Appendectomy; Tonsillectomy; Esophagogastroduodenoscopy (egd) with propofol  (N/A, 11/21/2016); Esophagogastroduodenoscopy (egd) with propofol  (N/A, 02/05/2018); Colonoscopy with propofol  (N/A, 11/18/2018); Xi robotic assisted thoracoscopy- segmentectomy (Right, 03/23/2022); Intercostal nerve block (03/23/2022); Lymph node biopsy (03/23/2022); Colonoscopy with propofol  (N/A, 04/26/2022); and Esophagogastroduodenoscopy (egd) with propofol  (N/A, 04/26/2022). Vanessa Oneill has a current medication list which includes the following prescription(s): albuterol , amlodipine, cholecalciferol, cyclobenzaprine , gabapentin , glimepiride , insulin  nph human, ipratropium, metformin , nadolol , NONFORMULARY OR COMPOUNDED ITEM, omeprazole, trazodone , [START ON 08/31/2023] hydrocodone -acetaminophen , [DISCONTINUED] fluticasone, and [DISCONTINUED] ropinirole , and the following Facility-Administered Medications: lactated ringers . Her primarily concern today is the Back Pain (lower)  Initial Vital Signs:  Pulse/HCG Rate: 78ECG Heart Rate: 80 Temp: (!) 97.5 F (36.4 C) Resp: (!) 22 BP: 122/73 SpO2: 94 %  BMI: Estimated body mass index is 26.63 kg/m as calculated from the following:   Height as of this encounter: 5' 7 (1.702 m).   Weight as of this encounter: 170 lb (77.1 kg).  Risk Assessment: Allergies: Reviewed. She is allergic to codeine, tylenol  with codeine #3 [acetaminophen -codeine], amoxicillin, lipitor [atorvastatin], lisinopril, and penicillins.  Allergy Precautions: None required Coagulopathies: Reviewed. None identified.  Blood-thinner therapy: None at this time Active Infection(s): Reviewed. None identified. Ms. Vanessa Oneill is afebrile  Site Confirmation: Vanessa Oneill was asked to confirm the procedure and laterality  before marking the site Procedure checklist: Completed Consent: Before the procedure and under the influence of no sedative(s), amnesic(s), or anxiolytics, the patient was informed of the treatment options, risks and possible complications. To fulfill our ethical and legal obligations,  as recommended by the American Medical Association's Code of Ethics, I have informed the patient of my clinical impression; the nature and purpose of the treatment or procedure; the risks, benefits, and possible complications of the intervention; the alternatives, including doing nothing; the risk(s) and benefit(s) of the alternative treatment(s) or procedure(s); and the risk(s) and benefit(s) of doing nothing. The patient was provided information about the general risks and possible complications associated with the procedure. These may include, but are not limited to: failure to achieve desired goals, infection, bleeding, organ or nerve damage, allergic reactions, paralysis, and death. In addition, the patient was informed of those risks and complications associated to Spine-related procedures, such as failure to decrease pain; infection (i.e.: Meningitis, epidural or intraspinal abscess); bleeding (i.e.: epidural hematoma, subarachnoid hemorrhage, or any other type of intraspinal or peri-dural bleeding); organ or nerve damage (i.e.: Any type of peripheral nerve, nerve root, or spinal cord injury) with subsequent damage to sensory, motor, and/or autonomic systems, resulting in permanent pain, numbness, and/or weakness of one or several areas of the body; allergic reactions; (i.e.: anaphylactic reaction); and/or death. Furthermore, the patient was informed of those risks and complications associated with the medications. These include, but are not limited to: allergic reactions (i.e.: anaphylactic or anaphylactoid reaction(s)); adrenal axis suppression; blood sugar elevation that in diabetics may result in ketoacidosis or comma;  water retention that in patients with history of congestive heart failure may result in shortness of breath, pulmonary edema, and decompensation with resultant heart failure; weight gain; swelling or edema; medication-induced neural toxicity; particulate matter embolism and blood vessel occlusion with resultant organ, and/or nervous system infarction; and/or aseptic necrosis of one or more joints. Finally, the patient was informed that Medicine is not an exact science; therefore, there is also the possibility of unforeseen or unpredictable risks and/or possible complications that may result in a catastrophic outcome. The patient indicated having understood very clearly. We have given the patient no guarantees and we have made no promises. Enough time was given to the patient to ask questions, all of which were answered to the patient's satisfaction. Ms. Chabot has indicated that she wanted to continue with the procedure. Attestation: I, the ordering provider, attest that I have discussed with the patient the benefits, risks, side-effects, alternatives, likelihood of achieving goals, and potential problems during recovery for the procedure that I have provided informed consent. Date  Time: 08/22/2023  2:10 PM  Pre-Procedure Preparation:  Monitoring: As per clinic protocol. Respiration, ETCO2, SpO2, BP, heart rate and rhythm monitor placed and checked for adequate function Safety Precautions: Patient was assessed for positional comfort and pressure points before starting the procedure. Time-out: I initiated and conducted the Time-out before starting the procedure, as per protocol. The patient was asked to participate by confirming the accuracy of the Time Out information. Verification of the correct person, site, and procedure were performed and confirmed by me, the nursing staff, and the patient. Time-out conducted as per Joint Commission's Universal Protocol (UP.01.01.01). Time:   Start Time: 1520  hrs.  Description of Procedure:          Laterality: (see above) Targeted Levels: (see above)  Safety Precautions: Aspiration looking for blood return was conducted prior to all injections. At no point did we inject any substances, as a needle was being advanced. Before injecting, the patient was told to immediately notify me if she was experiencing any new onset of ringing in the ears, or metallic taste in the mouth. No attempts were  made at seeking any paresthesias. Safe injection practices and needle disposal techniques used. Medications properly checked for expiration dates. SDV (single dose vial) medications used. After the completion of the procedure, all disposable equipment used was discarded in the proper designated medical waste containers. Local Anesthesia: Protocol guidelines were followed. The patient was positioned over the fluoroscopy table. The area was prepped in the usual manner. The time-out was completed. The target area was identified using fluoroscopy. A 12-in long, straight, sterile hemostat was used with fluoroscopic guidance to locate the targets for each level blocked. Once located, the skin was marked with an approved surgical skin marker. Once all sites were marked, the skin (epidermis, dermis, and hypodermis), as well as deeper tissues (fat, connective tissue and muscle) were infiltrated with a small amount of a short-acting local anesthetic, loaded on a 10cc syringe with a 25G, 1.5-in  Needle. An appropriate amount of time was allowed for local anesthetics to take effect before proceeding to the next step. Local Anesthetic: Lidocaine  2.0% The unused portion of the local anesthetic was discarded in the proper designated containers. Technical description of process:  Medial Branch  Dorsal Rami Nerve Block (MBB):  Neuroanatomy note: Each lumbar facet joint receives dual innervation from medial branches arising from the posterior primary rami at the same level and one level  above. The target for each lumbar medial branch is the junction of the ipsilateral superior articular and transverse process of the lower vertebral body. (i.e.: The L4-L5 facet joint is innervated by the L4 medial branch [located at L5] and the L3 medial branch [located at L4]. Blocking the L4 Medial Branch is therefore achieved by injecting at the junction of the ipsilateral superior articular and transverse process of the lower vertebral body [L5].).  Exception: The exception to the above rule is the L5-S1 facet joint which has triple innervation requiring the L4 medial branch, as well as the L5 and the S1 Dorsal Rami(s) to be blocked to fully denervate the joint.  Under fluoroscopic guidance, a needle was inserted until contact was made with os over the target area. After negative aspiration, 2mL of the nerve block solution was injected without difficulty or complication. Paresthesia were avoided during injection. The needle(s) were removed intact and without complication.  Once the entire procedure was completed, the treated area was cleaned, making sure to leave some of the prepping solution back to take advantage of its long term bactericidal properties.         Illustration of the posterior view of the lumbar spine and the posterior neural structures. Laminae of L2 through S1 are labeled. DPRL5, dorsal primary ramus of L5; DPRS1, dorsal primary ramus of S1; DPR3, dorsal primary ramus of L3; FJ, facet (zygapophyseal) joint L3-L4; I, inferior articular process of L4; LB1, lateral branch of dorsal primary ramus of L1; IAB, inferior articular branches from L3 medial branch (supplies L4-L5 facet joint); IBP, intermediate branch plexus; MB3, medial branch of dorsal primary ramus of L3; NR3, third lumbar nerve root; S, superior articular process of L5; SAB, superior articular branches from L4 (supplies L4-5 facet joint also); TP3, transverse process of L3.   Facet Joint Innervation (* possible  contribution)  L1-2 T12, L1 (L2*)  Medial Branch  L2-3 L1, L2 (L3*)                     L3-4 L2, L3 (L4*)  L4-5 L3, L4 (L5*)                     L5-S1 L4, L5, S1                        Vitals:   08/22/23 1515 08/22/23 1520 08/22/23 1525 08/22/23 1530  BP: (!) 126/92 112/83 115/81 119/89  Pulse:      Resp: (!) 22 18 16 18   Temp:    99 F (37.2 C)  SpO2: 96% 97% 98%   Weight:      Height:         End Time: 1520 hrs.  Imaging Guidance (Spinal):         Type of Imaging Technique: Fluoroscopy Guidance (Spinal) Indication(s): Fluoroscopy guidance for needle placement to enhance accuracy in procedures requiring precise needle localization for targeted delivery of medication in or near specific anatomical locations not easily accessible without such real-time imaging assistance. Exposure Time: Please see nurses notes. Contrast: None used. Fluoroscopic Guidance: I was personally present during the use of fluoroscopy. Tunnel Vision Technique used to obtain the best possible view of the target area. Parallax error corrected before commencing the procedure. Direction-depth-direction technique used to introduce the needle under continuous pulsed fluoroscopy. Once target was reached, antero-posterior, oblique, and lateral fluoroscopic projection used confirm needle placement in all planes. Images permanently stored in EMR. Interpretation: No contrast injected. I personally interpreted the imaging intraoperatively. Adequate needle placement confirmed in multiple planes. Permanent images saved into the patient's record.  Post-operative Assessment:  Post-procedure Vital Signs:  Pulse/HCG Rate: 7880 Temp: 99 F (37.2 C) Resp: 18 BP: 119/89 SpO2: 98 %  EBL: None  Complications: No immediate post-treatment complications observed by team, or reported by patient.  Note: The patient tolerated the entire procedure well. A repeat set of vitals were taken after the  procedure and the patient was kept under observation following institutional policy, for this type of procedure. Post-procedural neurological assessment was performed, showing return to baseline, prior to discharge. The patient was provided with post-procedure discharge instructions, including a section on how to identify potential problems. Should any problems arise concerning this procedure, the patient was given instructions to immediately contact us , at any time, without hesitation. In any case, we plan to contact the patient by telephone for a follow-up status report regarding this interventional procedure.  Comments:  No additional relevant information.  Plan of Care (POC)  Orders:  Orders Placed This Encounter  Procedures   DG PAIN CLINIC C-ARM 1-60 MIN NO REPORT    Intraoperative interpretation by procedural physician at [facility].    Standing Status:   Standing    Number of Occurrences:   1    Reason for exam::   Assistance in needle guidance and placement for procedures requiring needle placement in or near specific anatomical locations not easily accessible without such assistance.    PMP checked and reviewed.  Refill of hydrocodone  as below.  No further dose escalation.  Patient will continue with Emmy Blanch, nurse practitioner for medication management going forward.  I will see her back early September as scheduled to discuss lumbar radiofrequency ablation.  Medications ordered for procedure: Meds ordered this encounter  Medications   lidocaine  (XYLOCAINE ) 2 % (with pres) injection 400 mg   lactated ringers  infusion   midazolam  (VERSED ) injection 0.5-2 mg    Make sure Flumazenil is available in the pyxis when using this medication. If  oversedation occurs, administer 0.2 mg IV over 15 sec. If after 45 sec no response, administer 0.2 mg again over 1 min; may repeat at 1 min intervals; not to exceed 4 doses (1 mg)   ropivacaine  (PF) 2 mg/mL (0.2%) (NAROPIN )  injection 18 mL   dexamethasone  (DECADRON ) injection 20 mg   HYDROcodone -acetaminophen  (NORCO/VICODIN) 5-325 MG tablet    Sig: Take 1 tablet by mouth 3 (three) times daily as needed for severe pain (pain score 7-10). Must last 30 days    Dispense:  90 tablet    Refill:  0    Chronic Pain: STOP Act (Not applicable) Fill 1 day early if closed on refill date. Avoid benzodiazepines within 8 hours of opioids   Medications administered: We administered lidocaine , lactated ringers , midazolam , ropivacaine  (PF) 2 mg/mL (0.2%), and dexamethasone .  See the medical record for exact dosing, route, and time of administration.    B/L L3,4,5 MBNB 08/01/23, 08/22/2023    Follow-up plan:   Return for Keep sch. appt.     Recent Visits Date Type Provider Dept  08/13/23 Procedure visit Marcelino Nurse, MD [facility]  08/01/23 Procedure visit Marcelino Nurse, MD [facility]  07/03/23 Office Visit Marcelino Nurse, MD [facility]  Showing recent visits within past 90 days and meeting all other requirements Today's Visits Date Type Provider Dept  08/22/23 Procedure visit Marcelino Nurse, MD [facility]  Showing today's visits and meeting all other requirements Future Appointments Date Type Provider Dept  09/11/23 Appointment Marcelino Nurse, MD [facility]  09/20/23 Appointment Patel, Seema K, NP [facility]  Showing future appointments within next 90 days and meeting all other requirements   Disposition: Discharge home  Discharge (Date  Time): 08/22/2023; 1532 hrs.   Primary Care Physician: Fernande Ophelia JINNY DOUGLAS, MD Location: [facility] Note by: Nurse Marcelino, MD (TTS technology used. I apologize for any typographical errors that were not detected and corrected.) Date: 08/22/2023; Time: 3:44 PM  Disclaimer:  Medicine is not an Visual merchandiser. The only guarantee in medicine is that nothing is guaranteed. It is important to  note that the decision to proceed with this intervention was based on the information collected from the patient. The Data and conclusions were drawn from the patient's questionnaire, the interview, and the physical examination. Because the information was provided in large part by the patient, it cannot be guaranteed that it has not been purposely or unconsciously manipulated. Every effort has been made to obtain as much relevant data as possible for this evaluation. It is important to note that the conclusions that lead to this procedure are derived in large part from the available data. Always take into account that the treatment will also be dependent on availability of resources and existing treatment guidelines, considered by other Pain Management Practitioners as being common knowledge and practice, at the time of the intervention. For Medico-Legal purposes, it is also important to point out that variation in procedural techniques and pharmacological choices are the acceptable norm. The indications, contraindications, technique, and results of the above procedure should only be interpreted and judged by a Board-Certified Interventional Pain Specialist with extensive familiarity and expertise in the same exact procedure and technique.

## 2023-08-22 NOTE — Progress Notes (Signed)
 Safety precautions to be maintained throughout the outpatient stay will include: orient to surroundings, keep bed in low position, maintain call bell within reach at all times, provide assistance with transfer out of bed and ambulation.

## 2023-08-22 NOTE — Progress Notes (Signed)
 Nursing Pain Medication Assessment:  Safety precautions to be maintained throughout the outpatient stay will include: orient to surroundings, keep bed in low position, maintain call bell within reach at all times, provide assistance with transfer out of bed and ambulation.  Medication Inspection Compliance: Pill count conducted under aseptic conditions, in front of the patient. Neither the pills nor the bottle was removed from the patient's sight at any time. Once count was completed pills were immediately returned to the patient in their original bottle.  Medication: Hydrocodone /APAP Pill/Patch Count: 35 of 90 pills/patches remain Pill/Patch Appearance: Markings consistent with prescribed medication Bottle Appearance: Standard pharmacy container. Clearly labeled. Filled Date: 7 / 32 / 2025 Last Medication intake:  Today

## 2023-08-23 ENCOUNTER — Telehealth: Payer: Self-pay | Admitting: *Deleted

## 2023-08-23 NOTE — Telephone Encounter (Signed)
 No problems post procedure.

## 2023-08-28 ENCOUNTER — Other Ambulatory Visit: Payer: Self-pay | Admitting: Pulmonary Disease

## 2023-08-30 ENCOUNTER — Ambulatory Visit: Admitting: Student in an Organized Health Care Education/Training Program

## 2023-08-31 ENCOUNTER — Other Ambulatory Visit: Payer: Self-pay

## 2023-08-31 ENCOUNTER — Ambulatory Visit: Payer: Self-pay | Admitting: Urgent Care

## 2023-08-31 ENCOUNTER — Telehealth: Payer: Self-pay

## 2023-08-31 ENCOUNTER — Encounter
Admission: RE | Admit: 2023-08-31 | Discharge: 2023-08-31 | Disposition: A | Discharge status: Home / Self Care | Source: Ambulatory Visit | Attending: Pulmonary Disease | Admitting: Pulmonary Disease

## 2023-08-31 VITALS — BP 98/69 | Resp 16 | Ht 67.0 in | Wt 166.7 lb

## 2023-08-31 DIAGNOSIS — K7460 Unspecified cirrhosis of liver: Secondary | ICD-10-CM | POA: Diagnosis not present

## 2023-08-31 DIAGNOSIS — Z01810 Encounter for preprocedural cardiovascular examination: Secondary | ICD-10-CM

## 2023-08-31 DIAGNOSIS — D696 Thrombocytopenia, unspecified: Secondary | ICD-10-CM | POA: Insufficient documentation

## 2023-08-31 DIAGNOSIS — J449 Chronic obstructive pulmonary disease, unspecified: Secondary | ICD-10-CM | POA: Diagnosis not present

## 2023-08-31 DIAGNOSIS — E1169 Type 2 diabetes mellitus with other specified complication: Secondary | ICD-10-CM | POA: Insufficient documentation

## 2023-08-31 DIAGNOSIS — I8500 Esophageal varices without bleeding: Secondary | ICD-10-CM | POA: Insufficient documentation

## 2023-08-31 DIAGNOSIS — Z01812 Encounter for preprocedural laboratory examination: Secondary | ICD-10-CM

## 2023-08-31 DIAGNOSIS — I10 Essential (primary) hypertension: Secondary | ICD-10-CM | POA: Diagnosis not present

## 2023-08-31 DIAGNOSIS — Z01818 Encounter for other preprocedural examination: Secondary | ICD-10-CM | POA: Diagnosis not present

## 2023-08-31 DIAGNOSIS — R590 Localized enlarged lymph nodes: Secondary | ICD-10-CM

## 2023-08-31 HISTORY — DX: Dyspnea, unspecified: R06.00

## 2023-08-31 HISTORY — DX: Esophageal varices without bleeding: I85.00

## 2023-08-31 HISTORY — DX: Other nonspecific abnormal finding of lung field: R91.8

## 2023-08-31 HISTORY — DX: Thrombocytopenia, unspecified: D69.6

## 2023-08-31 HISTORY — DX: Tobacco use: Z72.0

## 2023-08-31 HISTORY — DX: Type 2 diabetes mellitus without complications: E11.9

## 2023-08-31 HISTORY — DX: Sepsis, unspecified organism: A41.9

## 2023-08-31 HISTORY — DX: Localized enlarged lymph nodes: R59.0

## 2023-08-31 HISTORY — DX: Iron deficiency anemia, unspecified: D50.9

## 2023-08-31 LAB — TYPE AND SCREEN
ABO/RH(D): O POS
Antibody Screen: NEGATIVE

## 2023-08-31 LAB — CBC
HCT: 34 % — ABNORMAL LOW (ref 36.0–46.0)
Hemoglobin: 11.2 g/dL — ABNORMAL LOW (ref 12.0–15.0)
MCH: 27.8 pg (ref 26.0–34.0)
MCHC: 32.9 g/dL (ref 30.0–36.0)
MCV: 84.4 fL (ref 80.0–100.0)
Platelets: 67 K/uL — ABNORMAL LOW (ref 150–400)
RBC: 4.03 MIL/uL (ref 3.87–5.11)
RDW: 16.3 % — ABNORMAL HIGH (ref 11.5–15.5)
WBC: 4.3 K/uL (ref 4.0–10.5)
nRBC: 0 % (ref 0.0–0.2)

## 2023-08-31 NOTE — Progress Notes (Signed)
  [facility] Perioperative Services: Pre-Admission/Anesthesia Testing  Abnormal Lab Notification   Date: 08/31/23  Name: Vanessa Oneill MRN:   [id number]  Re: Abnormal labs noted during PAT appointment   Notified:    Provider Name Provider Role Notification Mode  Parris Manna, MD Pulmonary Medicine Routed and/or faxed via [facility]ANE[facility] Babara Call, MD Hematology/ Medical Oncology Routed and/or faxed via [facility]   ABNORMAL LAB VALUE(S):   Lab Results  Component Value Date   PLT 67 (L) 08/31/2023   Clinical Information and Notes:  Vanessa Oneill is scheduled for a VIDEO BRONCHOSCOPY WITH ENDOBRONCHIAL NAVIGATION; BRONCHOSCOPY, WITH EBUS on 09/07/2023.   Preoperative platelet count low at 67 K/uL. Patient with a long standing history of thrombocytopenia. In review of the labs performed in the [facility] system dating back as far as 2015, patient has had platelet counts that have ranged between 40-126 K/uL. She is followed by hematology Milda, MD). 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 Patient is not on antithrombotic or anticoagulant therapies. Patient is being treated for concurrent lung cancer.   Sending result to hematology and pulmonary medicine ahead of upcoming procedure to ensure that providers are comfortable with proceeding with platelets being this low.   Query benefit of pre-procedural platelet transfusion?   Will the transfused platelets be readily removed from circulation by the enlarged spleen, thus not offering any clinical benefit, from a safety aspect (bleeding risk), to the procedure?   We will solicit input from the aforementioned specialities regarding the above. No further immediate needs from PAT APP at this time.   Dorise Pereyra, MSN, APRN, FNP-C, CEN [facility]  Perioperative Services Nurse Practitioner Phone: [phone] Fax: [phone] 08/31/23 5:17 PM

## 2023-08-31 NOTE — Patient Instructions (Addendum)
 Your procedure is scheduled on:09-07-23 Friday Report to the Registration Desk on the 1st floor of the [facility].Then proceed to the 2nd floor Surgery Desk To find out your arrival time, please call [phone] between 1PM - 3PM on:09-06-23 Thursday If your arrival time is 6:00 am, do not arrive before that time as the [facility] entrance doors do not open until 6:00 am.  REMEMBER: Instructions that are not followed completely may result in serious medical risk, up to and including death; or upon the discretion of your surgeon and anesthesiologist your surgery may need to be rescheduled.  Do not eat food after midnight the night before surgery.  No gum chewing or hard candies.  You may however, drink Water up to 2 hours before you are scheduled to arrive for your surgery. Do not drink anything within 2 hours of your scheduled arrival time  One week prior to surgery:Stop NOW (08-31-23) Stop Anti-inflammatories (NSAIDS) such as Advil , Aleve, Ibuprofen , Motrin , Naproxen, Naprosyn and Aspirin  based products such as Excedrin, Goody's Powder, BC Powder. Stop ANY OVER THE COUNTER supplements until after surgery (Vitamin D3, Melatonin)  You may however, continue to take Tylenol /Hydrocodone  if needed for pain up until the day of surgery.  Stop Ozempic 7 days prior to surgery-Do NOT take again until AFTER surgery  Stop metFORMIN  (GLUCOPHAGE -XR) 2 days prior to surgery-Last dose will be on 09-04-23 Tuesday  Continue taking all of your other prescription medications up until the day of surgery.  ON THE DAY OF SURGERY ONLY TAKE THESE MEDICATIONS WITH SIPS OF WATER: -gabapentin  (NEURONTIN )  -omeprazole (PRILOSEC)   Use your Albuterol  Inhaler the day of surgery and bring your inhaler to the hospital  Do NOT take any Insulin  the morning of surgery  No Alcohol for 24 hours before or after surgery.  No Smoking including e-cigarettes for 24 hours before surgery.  No chewable tobacco products for at  least 6 hours before surgery.  No nicotine  patches on the day of surgery.  Do not use any recreational drugs for at least a week (preferably 2 weeks) before your surgery.  Please be advised that the combination of cocaine and anesthesia may have negative outcomes, up to and including death. If you test positive for cocaine, your surgery will be cancelled.  On the morning of surgery brush your teeth with toothpaste and water, you may rinse your mouth with mouthwash if you wish. Do not swallow any toothpaste or mouthwash.  Do not wear jewelry, make-up, hairpins, clips or nail polish.  For welded (permanent) jewelry: bracelets, anklets, waist bands, etc.  Please have this removed prior to surgery.  If it is not removed, there is a chance that hospital personnel will need to cut it off on the day of surgery.  Do not wear lotions, powders, or perfumes.   Do not shave body hair from the neck down 48 hours before surgery.  Contact lenses, hearing aids and dentures may not be worn into surgery.  Do not bring valuables to the hospital. [facility] is not responsible for any missing/lost belongings or valuables. t.   Notify your doctor if there is any change in your medical condition (cold, fever, infection).  Wear comfortable clothing (specific to your surgery type) to the hospital.  After surgery, you can help prevent lung complications by doing breathing exercises.  Take deep breaths and cough every 1-2 hours. Your doctor may order a device called an Incentive Spirometer to help you take deep breaths. When coughing  or sneezing, hold a pillow firmly against your incision with both hands. This is called "splinting." Doing this helps protect your incision. It also decreases belly discomfort.  If you are being admitted to the hospital overnight, leave your suitcase in the car. After surgery it may be brought to your room.  In case of increased patient census, it may be necessary for you, the  patient, to continue your postoperative care in the Same Day Surgery department.  If you are being discharged the day of surgery, you will not be allowed to drive home. You will need a responsible individual to drive you home and stay with you for 24 hours after surgery.   If you are taking public transportation, you will need to have a responsible individual with you.  Please call the Pre-admissions Testing Dept. at [phone] if you have any questions about these instructions.  Surgery Visitation Policy:  Patients having surgery or a procedure may have two visitors.  Children under the age of 75 must have an adult with them who is not the patient.   Merchandiser, retail to address health-related social needs:  [email or web address]

## 2023-08-31 NOTE — Telephone Encounter (Signed)
 Pt scheduled for bronchoscopy on 09/07/23.   Please schedule MD approx 1 -2 week after 9/5 and notify pt of appt details.

## 2023-09-04 ENCOUNTER — Other Ambulatory Visit: Payer: Self-pay | Admitting: Pulmonary Disease

## 2023-09-04 DIAGNOSIS — R590 Localized enlarged lymph nodes: Secondary | ICD-10-CM

## 2023-09-04 DIAGNOSIS — R918 Other nonspecific abnormal finding of lung field: Secondary | ICD-10-CM

## 2023-09-05 ENCOUNTER — Ambulatory Visit
Admission: RE | Admit: 2023-09-05 | Discharge: 2023-09-05 | Disposition: A | Discharge status: Home / Self Care | Source: Ambulatory Visit | Attending: Pulmonary Disease | Admitting: Pulmonary Disease

## 2023-09-05 DIAGNOSIS — J849 Interstitial pulmonary disease, unspecified: Secondary | ICD-10-CM | POA: Diagnosis not present

## 2023-09-05 DIAGNOSIS — R161 Splenomegaly, not elsewhere classified: Secondary | ICD-10-CM | POA: Diagnosis not present

## 2023-09-05 DIAGNOSIS — R590 Localized enlarged lymph nodes: Secondary | ICD-10-CM | POA: Diagnosis not present

## 2023-09-05 DIAGNOSIS — R918 Other nonspecific abnormal finding of lung field: Secondary | ICD-10-CM | POA: Diagnosis not present

## 2023-09-06 ENCOUNTER — Encounter: Payer: Self-pay | Admitting: Pulmonary Disease

## 2023-09-06 DIAGNOSIS — Z794 Long term (current) use of insulin: Secondary | ICD-10-CM | POA: Diagnosis not present

## 2023-09-06 DIAGNOSIS — F17210 Nicotine dependence, cigarettes, uncomplicated: Secondary | ICD-10-CM | POA: Diagnosis not present

## 2023-09-06 DIAGNOSIS — G959 Disease of spinal cord, unspecified: Secondary | ICD-10-CM

## 2023-09-06 DIAGNOSIS — E119 Type 2 diabetes mellitus without complications: Secondary | ICD-10-CM | POA: Diagnosis not present

## 2023-09-06 DIAGNOSIS — Z01818 Encounter for other preprocedural examination: Secondary | ICD-10-CM | POA: Diagnosis not present

## 2023-09-06 DIAGNOSIS — I2510 Atherosclerotic heart disease of native coronary artery without angina pectoris: Secondary | ICD-10-CM | POA: Diagnosis not present

## 2023-09-06 DIAGNOSIS — Z85118 Personal history of other malignant neoplasm of bronchus and lung: Secondary | ICD-10-CM | POA: Diagnosis not present

## 2023-09-06 DIAGNOSIS — I700 Atherosclerosis of aorta: Secondary | ICD-10-CM | POA: Diagnosis not present

## 2023-09-07 ENCOUNTER — Ambulatory Visit: Payer: Self-pay | Admitting: Urgent Care

## 2023-09-07 ENCOUNTER — Encounter
Admission: RE | Disposition: A | Discharge status: Home / Self Care | Payer: Self-pay | Source: Home / Self Care | Attending: Pulmonary Disease

## 2023-09-07 ENCOUNTER — Ambulatory Visit

## 2023-09-07 ENCOUNTER — Other Ambulatory Visit: Payer: Self-pay

## 2023-09-07 ENCOUNTER — Ambulatory Visit
Admission: RE | Admit: 2023-09-07 | Discharge: 2023-09-07 | Disposition: A | Discharge status: Home / Self Care | Attending: Pulmonary Disease | Admitting: Pulmonary Disease

## 2023-09-07 ENCOUNTER — Encounter: Payer: Self-pay | Admitting: Pulmonary Disease

## 2023-09-07 DIAGNOSIS — Z85118 Personal history of other malignant neoplasm of bronchus and lung: Secondary | ICD-10-CM | POA: Diagnosis not present

## 2023-09-07 DIAGNOSIS — R590 Localized enlarged lymph nodes: Secondary | ICD-10-CM | POA: Insufficient documentation

## 2023-09-07 DIAGNOSIS — R0989 Other specified symptoms and signs involving the circulatory and respiratory systems: Secondary | ICD-10-CM | POA: Diagnosis not present

## 2023-09-07 DIAGNOSIS — I10 Essential (primary) hypertension: Secondary | ICD-10-CM | POA: Diagnosis not present

## 2023-09-07 DIAGNOSIS — G959 Disease of spinal cord, unspecified: Secondary | ICD-10-CM

## 2023-09-07 DIAGNOSIS — I2699 Other pulmonary embolism without acute cor pulmonale: Secondary | ICD-10-CM | POA: Diagnosis not present

## 2023-09-07 DIAGNOSIS — J984 Other disorders of lung: Secondary | ICD-10-CM | POA: Diagnosis not present

## 2023-09-07 DIAGNOSIS — R911 Solitary pulmonary nodule: Secondary | ICD-10-CM | POA: Diagnosis not present

## 2023-09-07 DIAGNOSIS — J449 Chronic obstructive pulmonary disease, unspecified: Secondary | ICD-10-CM | POA: Insufficient documentation

## 2023-09-07 DIAGNOSIS — Z48813 Encounter for surgical aftercare following surgery on the respiratory system: Secondary | ICD-10-CM | POA: Diagnosis not present

## 2023-09-07 DIAGNOSIS — R918 Other nonspecific abnormal finding of lung field: Secondary | ICD-10-CM | POA: Diagnosis not present

## 2023-09-07 DIAGNOSIS — F17210 Nicotine dependence, cigarettes, uncomplicated: Secondary | ICD-10-CM | POA: Diagnosis not present

## 2023-09-07 DIAGNOSIS — G4730 Sleep apnea, unspecified: Secondary | ICD-10-CM | POA: Insufficient documentation

## 2023-09-07 DIAGNOSIS — I2510 Atherosclerotic heart disease of native coronary artery without angina pectoris: Secondary | ICD-10-CM | POA: Diagnosis not present

## 2023-09-07 HISTORY — DX: Disease of spinal cord, unspecified: G95.9

## 2023-09-07 HISTORY — DX: Radiculopathy, cervical region: M54.12

## 2023-09-07 HISTORY — DX: Malignant neoplasm of unspecified part of right bronchus or lung: C34.91

## 2023-09-07 HISTORY — DX: Insomnia, unspecified: G47.00

## 2023-09-07 HISTORY — DX: Repeated falls: R29.6

## 2023-09-07 HISTORY — DX: Gastro-esophageal reflux disease without esophagitis: K21.9

## 2023-09-07 HISTORY — PX: VIDEO BRONCHOSCOPY WITH ENDOBRONCHIAL ULTRASOUND: SHX6177

## 2023-09-07 HISTORY — PX: VIDEO BRONCHOSCOPY WITH ENDOBRONCHIAL NAVIGATION: SHX6175

## 2023-09-07 LAB — GLUCOSE, CAPILLARY
Glucose-Capillary: 152 mg/dL — ABNORMAL HIGH (ref 70–99)
Glucose-Capillary: 133 mg/dL — ABNORMAL HIGH (ref 70–99)

## 2023-09-07 SURGERY — VIDEO BRONCHOSCOPY WITH ENDOBRONCHIAL NAVIGATION
Anesthesia: General

## 2023-09-07 MED ORDER — FENTANYL CITRATE (PF) 100 MCG/2ML IJ SOLN
INTRAMUSCULAR | Status: AC
  Filled 2023-09-07: qty 2

## 2023-09-07 MED ORDER — DEXAMETHASONE SODIUM PHOSPHATE 10 MG/ML IJ SOLN
INTRAMUSCULAR | Status: DC | PRN
  Administered 2023-09-07: 5 mg via INTRAVENOUS

## 2023-09-07 MED ORDER — PROPOFOL 10 MG/ML IV BOLUS
INTRAVENOUS | Status: AC
Start: 2023-09-07 — End: 2023-09-07
  Filled 2023-09-07: qty 20

## 2023-09-07 MED ORDER — LIDOCAINE HCL (PF) 2 % IJ SOLN
INTRAMUSCULAR | Status: AC
Start: 2023-09-07 — End: 2023-09-07
  Filled 2023-09-07: qty 5

## 2023-09-07 MED ORDER — DEXAMETHASONE SODIUM PHOSPHATE 10 MG/ML IJ SOLN
INTRAMUSCULAR | Status: AC
  Filled 2023-09-07: qty 1

## 2023-09-07 MED ORDER — MIDAZOLAM HCL 2 MG/2ML IJ SOLN
INTRAMUSCULAR | Status: DC | PRN
  Administered 2023-09-07: 2 mg via INTRAVENOUS

## 2023-09-07 MED ORDER — FENTANYL CITRATE (PF) 100 MCG/2ML IJ SOLN
INTRAMUSCULAR | Status: DC | PRN
  Administered 2023-09-07: 25 ug via INTRAVENOUS
  Administered 2023-09-07: 50 ug via INTRAVENOUS
  Administered 2023-09-07: 25 ug via INTRAVENOUS

## 2023-09-07 MED ORDER — ROCURONIUM BROMIDE 100 MG/10ML IV SOLN
INTRAVENOUS | Status: DC | PRN
Start: 2023-09-07 — End: 2023-09-07
  Administered 2023-09-07: 50 mg via INTRAVENOUS

## 2023-09-07 MED ORDER — OXYCODONE HCL 5 MG/5ML PO SOLN: 5.0000 mg | Freq: Once | ORAL | Status: DC | PRN

## 2023-09-07 MED ORDER — LACTATED RINGERS IV SOLN: INTRAVENOUS | Status: DC

## 2023-09-07 MED ORDER — OXYCODONE HCL 5 MG PO TABS: 5.0000 mg | ORAL_TABLET | Freq: Once | ORAL | Status: DC | PRN

## 2023-09-07 MED ORDER — PROPOFOL 10 MG/ML IV BOLUS
INTRAVENOUS | Status: DC | PRN
  Administered 2023-09-07: 150 mg via INTRAVENOUS

## 2023-09-07 MED ORDER — CHLORHEXIDINE GLUCONATE 0.12 % MT SOLN
OROMUCOSAL | Status: AC
  Filled 2023-09-07: qty 15

## 2023-09-07 MED ORDER — FENTANYL CITRATE (PF) 100 MCG/2ML IJ SOLN: 25.0000 ug | INTRAMUSCULAR | Status: DC | PRN

## 2023-09-07 MED ORDER — MIDAZOLAM HCL 2 MG/2ML IJ SOLN
INTRAMUSCULAR | Status: AC
  Filled 2023-09-07: qty 2

## 2023-09-07 MED ORDER — LIDOCAINE HCL (CARDIAC) PF 100 MG/5ML IV SOSY
PREFILLED_SYRINGE | INTRAVENOUS | Status: DC | PRN
  Administered 2023-09-07: 60 mg via INTRAVENOUS

## 2023-09-07 MED ORDER — ONDANSETRON HCL 4 MG/2ML IJ SOLN
INTRAMUSCULAR | Status: AC
  Filled 2023-09-07: qty 2

## 2023-09-07 MED ORDER — CHLORHEXIDINE GLUCONATE 0.12 % MT SOLN
15.0000 mL | Freq: Once | OROMUCOSAL | Status: AC
  Administered 2023-09-07: 15 mL via OROMUCOSAL

## 2023-09-07 MED ORDER — EPHEDRINE 5 MG/ML INJ
INTRAVENOUS | Status: AC
  Filled 2023-09-07: qty 5

## 2023-09-07 MED ORDER — ORAL CARE MOUTH RINSE: 15.0000 mL | Freq: Once | OROMUCOSAL | Status: AC

## 2023-09-07 MED ORDER — ONDANSETRON HCL 4 MG/2ML IJ SOLN
INTRAMUSCULAR | Status: DC | PRN
Start: 2023-09-07 — End: 2023-09-07
  Administered 2023-09-07: 4 mg via INTRAVENOUS

## 2023-09-07 MED ORDER — SUGAMMADEX SODIUM 200 MG/2ML IV SOLN
INTRAVENOUS | Status: DC | PRN
  Administered 2023-09-07: 160 mg via INTRAVENOUS

## 2023-09-07 MED ORDER — ROCURONIUM BROMIDE 10 MG/ML (PF) SYRINGE
PREFILLED_SYRINGE | INTRAVENOUS | Status: AC
  Filled 2023-09-07: qty 10

## 2023-09-07 MED ORDER — SODIUM CHLORIDE 0.9 % IV SOLN: INTRAVENOUS | Status: DC

## 2023-09-07 NOTE — Transfer of Care (Signed)
 Immediate Anesthesia Transfer of Care Note  Patient: Vanessa Oneill  Procedure(s) Performed: VIDEO BRONCHOSCOPY WITH ENDOBRONCHIAL NAVIGATION BRONCHOSCOPY, WITH EBUS  Patient Location: PACU  Anesthesia Type:General  Level of Consciousness: drowsy  Airway & Oxygen Therapy: Patient Spontanous Breathing and Patient connected to face mask oxygen  Post-op Assessment: Report given to RN and Post -op Vital signs reviewed and stable  Post vital signs: Reviewed and stable  Last Vitals:  Vitals Value Taken Time  BP 120/61 09/07/23 09:50  Temp 36 C 09/07/23 09:50  Pulse 71 09/07/23 09:56  Resp 22 09/07/23 09:56  SpO2 93 % 09/07/23 09:56  Vitals shown include unfiled device data.  Last Pain:  Vitals:   09/07/23 0950  TempSrc:   PainSc: Asleep         Complications: No notable events documented.

## 2023-09-07 NOTE — H&P (Signed)
 PULMONOLOGY         Date: 09/07/2023,   MRN# [id number] Vanessa Oneill [birth year]     AdmissionWeight: 75.6 kg                 CurrentWeight: 75.6 kg  Referring provider: Dr Theotis   CHIEF COMPLAINT:   Bilateral lung nodules with hilar adenopathy   HISTORY OF PRESENT ILLNESS   This is a 73 yo F with hx of COPD and previous smoking who was seen in pulmonary clinic with noted lung nodules and hilar adenopathy. She has seen oncology for Stage 1a2 rll SQUAMOUS CELL carcinoma an dhad R basilar segmentectomy.  She had PET scan with hypermetabolic nodules and lymphadneopathy in hilar and mediastinal stations concerning for disease recurrence.  She is here today for bronchoscopic evaluation with airway inspection, therapeutic aspiration of any noted mucus plugging, BAL with flexible bronchoscopy, robotic assisted navigational bronchoscopy with tranbronchial FNA, cytologic brushings, surgical lung biopsy of bilateral lung nodules and endobronchial US  assisted lymph node biopsies. Reviewed risks/complications and benefits with patient, risks include infection, pneumothorax/pneumomediastinum which may require chest tube placement as well as overnight/prolonged hospitalization and possible mechanical ventilation. Other risks include bleeding and very rarely death.  Patient understands risks and wishes to proceed.  Additional questions were answered, and patient is aware that post procedure patient will be going home with family and may experience cough with possible clots on expectoration as well as phlegm which may last few days as well as hoarseness of voice post intubation and mechanical ventilation.    PAST MEDICAL HISTORY   Past Medical History:  Diagnosis Date   Anxiety    Aortic atherosclerosis (HCC)    Arthritis    Cerebral aneurysm    Cervical radiculitis    Colon adenomas    COPD (chronic obstructive pulmonary disease) (HCC)    Coronary artery disease    Depression     DM (diabetes mellitus), type 2 (HCC)    Dyspnea    ESBL (extended spectrum beta-lactamase) producing bacteria infection    Esophageal varices (HCC)    Frequent falls    GERD (gastroesophageal reflux disease)    Headache    Hilar adenopathy    Hyperlipidemia    Hypertension    IDA (iron deficiency anemia)    Insomnia    Liver disease    Lumbar myelopathy (HCC)    Lung nodules    Mitral valve regurgitation    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HCC)    Personal history of tobacco use, presenting hazards to health 11/12/2014   Portal venous hypertension (HCC)    Raynaud's disease    Septic shock (HCC)    Sleep apnea    does not use cpap   Squamous cell carcinoma of right lung (HCC)    Thrombocytopenia (HCC)    Tobacco use      SURGICAL HISTORY   Past Surgical History:  Procedure Laterality Date   ABDOMINAL HYSTERECTOMY     APPENDECTOMY     CHOLECYSTECTOMY     COLONOSCOPY WITH PROPOFOL  N/A 11/18/2018   Procedure: COLONOSCOPY WITH PROPOFOL ;  Surgeon: Toledo, Ladell POUR, MD;  Location: [facility] ENDOSCOPY;  Service: Gastroenterology;  Laterality: N/A;   COLONOSCOPY WITH PROPOFOL  N/A 04/26/2022   Procedure: COLONOSCOPY WITH PROPOFOL ;  Surgeon: Toledo, Ladell POUR, MD;  Location: [facility] ENDOSCOPY;  Service: Gastroenterology;  Laterality: N/A;   ESOPHAGOGASTRODUODENOSCOPY (EGD) WITH PROPOFOL  N/A 07/24/2016   Procedure: ESOPHAGOGASTRODUODENOSCOPY (EGD) WITH PROPOFOL ;  Surgeon: Therisa Bi, MD;  Location:  [facility] ENDOSCOPY;  Service: Endoscopy;  Laterality: N/A;   ESOPHAGOGASTRODUODENOSCOPY (EGD) WITH PROPOFOL  N/A 11/21/2016   Procedure: ESOPHAGOGASTRODUODENOSCOPY (EGD) WITH PROPOFOL ;  Surgeon: Toledo, Ladell POUR, MD;  Location: [facility] ENDOSCOPY;  Service: Gastroenterology;  Laterality: N/A;   ESOPHAGOGASTRODUODENOSCOPY (EGD) WITH PROPOFOL  N/A 02/05/2018   Procedure: ESOPHAGOGASTRODUODENOSCOPY (EGD) WITH PROPOFOL ;  Surgeon: Toledo, Ladell POUR, MD;  Location: [facility] ENDOSCOPY;  Service: Endoscopy;  Laterality:  N/A;   ESOPHAGOGASTRODUODENOSCOPY (EGD) WITH PROPOFOL  N/A 04/26/2022   Procedure: ESOPHAGOGASTRODUODENOSCOPY (EGD) WITH PROPOFOL ;  Surgeon: Toledo, Ladell POUR, MD;  Location: [facility] ENDOSCOPY;  Service: Gastroenterology;  Laterality: N/A;   INTERCOSTAL NERVE BLOCK  03/23/2022   Procedure: INTERCOSTAL NERVE BLOCK;  Surgeon: Kerrin Elspeth BROCKS, MD;  Location: [facility] OR;  Service: Thoracic;;   LYMPH NODE BIOPSY  03/23/2022   Procedure: LYMPH NODE BIOPSY;  Surgeon: Kerrin Elspeth BROCKS, MD;  Location: [facility] OR;  Service: Thoracic;;   TONSILLECTOMY     XI ROBOTIC ASSISTED THORACOSCOPY- SEGMENTECTOMY Right 03/23/2022   Procedure: XI ROBOTIC ASSISTED THORACOSCOPY-RIGHT LOWER LOBE LATERAL BASILAR SEGMENTECTOMY;  Surgeon: Kerrin Elspeth BROCKS, MD;  Location: [facility] OR;  Service: Thoracic;  Laterality: Right;     FAMILY HISTORY   Family History  Problem Relation Age of Onset   Lung cancer Father    Heart Problems Father    Heart failure Mother    Breast cancer Neg Hx      SOCIAL HISTORY   Social History   Tobacco Use   Smoking status: Every Day    Current packs/day: 0.00    Average packs/day: 1 pack/day for 48.0 years (48.0 ttl pk-yrs)    Types: Cigarettes    Start date: 03/23/1974    Last attempt to quit: 03/23/2022    Years since quitting: 1.4   Smokeless tobacco: Never   Tobacco comments:    patient refused  Vaping Use   Vaping status: Never Used  Substance Use Topics   Alcohol use: No    Alcohol/week: 0.0 standard drinks of alcohol   Drug use: No     MEDICATIONS    Home Medication:    Current Medication:  Current Facility-Administered Medications:    0.9 %  sodium chloride  infusion, , Intravenous, Continuous, Vicci Camellia Glatter, MD, Last Rate: 40 mL/hr at 09/07/23 0719, Continued from Pre-op at 09/07/23 0719    ALLERGIES   Codeine, Tylenol  with codeine #3 [acetaminophen -codeine], Amoxicillin, Lipitor [atorvastatin], Lisinopril, and Penicillins     REVIEW OF  SYSTEMS    Review of Systems:  Gen:  Denies  fever, sweats, chills weigh loss  HEENT: Denies blurred vision, double vision, ear pain, eye pain, hearing loss, nose bleeds, sore throat Cardiac:  No dizziness, chest pain or heaviness, chest tightness,edema Resp:   reports dyspnea chronically  Gi: Denies swallowing difficulty, stomach pain, nausea or vomiting, diarrhea, constipation, bowel incontinence Gu:  Denies bladder incontinence, burning urine Ext:   Denies Joint pain, stiffness or swelling Skin: Denies  skin rash, easy bruising or bleeding or hives Endoc:  Denies polyuria, polydipsia , polyphagia or weight change Psych:   Denies depression, insomnia or hallucinations   Other:  All other systems negative   VS: BP 132/74   Pulse 72   Temp (!) 97 F (36.1 C) (Temporal)   Resp 18   Ht 5' 7 (1.702 m)   Wt 75.6 kg   SpO2 99%   BMI 26.11 kg/m      PHYSICAL EXAM    GENERAL:NAD, no fevers, chills, no weakness no fatigue HEAD:  Normocephalic, atraumatic.  EYES: Pupils equal, round, reactive to light. Extraocular muscles intact. No scleral icterus.  MOUTH: Moist mucosal membrane. Dentition intact. No abscess noted.  EAR, NOSE, THROAT: Clear without exudates. No external lesions.  NECK: Supple. No thyromegaly. No nodules. No JVD.  PULMONARY: decreased breath sounds with mild rhonchi worse at bases bilaterally.  CARDIOVASCULAR: S1 and S2. Regular rate and rhythm. No murmurs, rubs, or gallops. No edema. Pedal pulses 2+ bilaterally.  GASTROINTESTINAL: Soft, nontender, nondistended. No masses. Positive bowel sounds. No hepatosplenomegaly.  MUSCULOSKELETAL: No swelling, clubbing, or edema. Range of motion full in all extremities.  NEUROLOGIC: Cranial nerves II through XII are intact. No gross focal neurological deficits. Sensation intact. Reflexes intact.  SKIN: No ulceration, lesions, rashes, or cyanosis. Skin warm and dry. Turgor intact.  PSYCHIATRIC: Mood, affect within normal  limits. The patient is awake, alert and oriented x 3. Insight, judgment intact.       IMAGING   @IMAGES @   ASSESSMENT/PLAN   Bilateral lung nodules with hilar/mediastinal adenopathy   - hx of RLL sq cell lung cancer and COPD She is here today for bronchoscopic evaluation with airway inspection, therapeutic aspiration of any noted mucus plugging, BAL with flexible bronchoscopy, robotic assisted navigational bronchoscopy with tranbronchial FNA, cytologic brushings, surgical lung biopsy of bilateral lung nodules and endobronchial US  assisted lymph node biopsies. Reviewed risks/complications and benefits with patient, risks include infection, pneumothorax/pneumomediastinum which may require chest tube placement as well as overnight/prolonged hospitalization and possible mechanical ventilation. Other risks include bleeding and very rarely death.  Patient understands risks and wishes to proceed.  Additional questions were answered, and patient is aware that post procedure patient will be going home with family and may experience cough with possible clots on expectoration as well as phlegm which may last few days as well as hoarseness of voice post intubation and mechanical ventilation.        Thank you for allowing me to participate in the care of this patient.   Patient/Family are satisfied with care plan and all questions have been answered.    Provider disclosure: Patient with at least one acute or chronic illness or injury that poses a threat to life or bodily function and is being managed actively during this encounter.  All of the below services have been performed independently by signing provider:  review of prior documentation from internal and or external health records.  Review of previous and current lab results.  Interview and comprehensive assessment during patient visit today. Review of current and previous chest radiographs/CT scans. Discussion of management and test  interpretation with health care team and patient/family.   This document was prepared using Dragon voice recognition software and may include unintentional dictation errors.     Matheson Vandehei, M.D.  Division of Pulmonary & Critical Care Medicine

## 2023-09-07 NOTE — Anesthesia Preprocedure Evaluation (Signed)
 Anesthesia Evaluation  Patient identified by MRN, date of birth, ID band Patient awake    Reviewed: Allergy & Precautions, NPO status , Patient's Chart, lab work & pertinent test results  History of Anesthesia Complications Negative for: history of anesthetic complications  Airway Mallampati: II  TM Distance: >3 FB Neck ROM: Full    Dental no notable dental hx. (+) Teeth Intact   Pulmonary sleep apnea , COPD,  COPD inhaler, Current Smoker and Patient abstained from smoking. Lung cancer s/p resection last month   Pulmonary exam normal breath sounds clear to auscultation       Cardiovascular Exercise Tolerance: Good hypertension, + CAD  (-) Past MI (-) dysrhythmias  Rhythm:Regular Rate:Normal - Systolic murmurs    Neuro/Psych  Headaches PSYCHIATRIC DISORDERS Anxiety Depression       GI/Hepatic ,neg GERD  ,,(+)     (-) substance abuse    Endo/Other  diabetes    Renal/GU      Musculoskeletal   Abdominal   Peds  Hematology   Anesthesia Other Findings Past Medical History: No date: Acid reflux No date: Anxiety No date: Aortic atherosclerosis No date: Arthritis No date: Cerebral aneurysm No date: Colon adenomas No date: COPD (chronic obstructive pulmonary disease) No date: Coronary artery disease No date: Depression No date: Diabetes mellitus without complication No date: ESBL (extended spectrum beta-lactamase) producing bacteria  infection No date: Headache No date: Hyperlipidemia No date: Hypertension No date: Liver disease No date: Lumbago No date: Mitral valve regurgitation No date: Non-alcoholic cirrhosis 11/12/2014: Personal history of tobacco use, presenting hazards to  health No date: Raynaud's disease No date: Sleep apnea  Reproductive/Obstetrics                              Anesthesia Physical Anesthesia Plan  ASA: 3  Anesthesia Plan: General ETT   Post-op Pain  Management:    Induction: Intravenous  PONV Risk Score and Plan: 3 and Ondansetron  and Dexamethasone   Airway Management Planned: Oral ETT  Additional Equipment:   Intra-op Plan:   Post-operative Plan: Extubation in OR  Informed Consent: I have reviewed the patients History and Physical, chart, labs and discussed the procedure including the risks, benefits and alternatives for the proposed anesthesia with the patient or authorized representative who has indicated his/her understanding and acceptance.     Dental Advisory Given  Plan Discussed with: Anesthesiologist, CRNA and Surgeon  Anesthesia Plan Comments: (Patient consented for risks of anesthesia including but not limited to:  - adverse reactions to medications - damage to eyes, teeth, lips or other oral mucosa - nerve damage due to positioning  - sore throat or hoarseness - Damage to heart, brain, nerves, lungs, other parts of body or loss of life  Patient voiced understanding and assent.)        Anesthesia Quick Evaluation

## 2023-09-07 NOTE — Procedures (Signed)
 ROBOTIC NAVIGATIONAL BRONCHOSCOPY PROCEDURE NOTE  FIBEROPTIC BRONCHOSCOPY WITH BRONCHOALVEOLAR LAVAGE PROCEDURE NOTE  THERAPEUTIC ASPIRATION OF TRACHEOBRONCHIAL TREE  ENDOBRONCHIAL ULTRASOUND X >/1 LYMPH NODE PROCEDURE NOTE  TRANSBRONCHIAL FNA X 1 LOBE  TRANSBRONCHIAL SURGICAL LUNG BIOPSY X 1 LOBE  RADIAL ENDOBRONCHIAL ULTRASOUND  TRANSBRONCHIAL CYTOPATHOLOGY BRUSHINGS    Flexible bronchoscopy was performed  by : Parris MD  assistance by : 1)Repiratory therapist  and 2)cytotech staff and 3) Anesthesia team and 4) Flouroscopy team and 5) IT supporting staff for robotic platform   Indication for the procedure was :  Pre-procedural H&P. The following assessment was performed on the day of the procedure prior to initiating sedation History:  Chest pain n Dyspnea y Hemoptysis n Cough y Fever n Other pertinent items n  Examination Vital signs -reviewed as per nursing documentation today Cardiac    Murmurs: n  Rubs : n  Gallop: n Lungs Wheezing: n Rales : n Rhonchi :y  Other pertinent findings: SOB/hypoxemia due to chronic lung disease   Pre-procedural assessment for Procedural Sedation included: Depth of sedation: As per anesthesia team  ASA Classification:  2 Mallampati airway assessment: 3    Medication list reviewed: y  The patient's interval history was taken and revealed: no new complaints The pre- procedure physical examination revealed: No new findings Refer to prior clinic note for details.  Informed Consent: Informed consent was obtained from:  patient after explanation of procedure and risks, benefits, as well as alternative procedures available.  Explanation of level of sedation and possible transfusion was also provided.    Procedural Preparation: Time out was performed and patient was identified by name and birthdate and procedure to be performed and side for sampling, if any, was specified. Pt was intubated by anesthesia.  The patient was  appropriately draped.   Fiberoptic bronchoscopy with airway inspection, therapeutic aspiration of tracheobronchial tree and BAL Procedure findings:  Bronchoscope was inserted via ETT  without difficulty.  Posterior oropharynx, epiglottis, arytenoids, false cords and vocal cords were not visualized as these were bypassed by endotracheal tube. The distal trachea was normal in circumference and appearance without mucosal, cartilaginous or branching abnormalities.  The main carina was mildly splayed . All right and left lobar airways were NOT visualized to the Subsegmental level due to obstructed airways from phlegm and mucus  plugging. Mucus plugging with partial to complete airway obstruction was noted bilaterally and treated with therapeutic aspiration prior to beginning of robotic bronchoscopy to allow adequate visualization and accurate lung biopsy and improve patients respiratory status.   The mucosa was : friable at target segment of Right upper lobe  Airways were notable for:        exophytic lesions :n       extrinsic compression in the following distributions: n.       Friable mucosa: y       Teacher, music /pigmentation: n     Post procedure Diagnosis:   Mucus plugging of tracheobronchial tree      Navigational Bronchoscopy Procedure Findings:   Ion robotic platform utilized for this procedure. Post appropriate planning and registration peripheral navigation was used to visualize target lesion.   Radial endobronchial US  technology utilized during this procedure for confirmation of lesion.  3D Fluoroscopy utilized for tool in lesion confirmation    Target 1 Right upper lobe- Transbronchial cytobrushing x  1,   Transbronchial FNA x 4,   Transbronchial surgical pathology x 3,    BAL was performed at this location and sent  for processing with cytology and microbiology  Post procedure diagnosis:  cavitary lesion of of right upper lobe       Endobronchial ultrasound  assisted hilar and mediastinal lymph node biopsies procedure findings: The fiberoptic bronchoscope was removed and the EBUS scope was introduced. Examination began to evaluate for pathologically enlarged lymph nodes starting on the left  side progressing to the right side.  All lymph node biopsies performed with 21g needle. Lymph node biopsies were sent in cytolite for all stations.   Station 10L- 7mm not biopsied Station 4L - 5mm not biopsied Station 7 - 6mm not biopsied Station 10R - 7mm not biopsied Station 4R - 1.2cm biopsied 4 times   Post procedure diagnosis:  hilar lymphadneopathy     Immediate sampling complications included:NONE immediate  Epinephrine ZERO ml was used topically  The bronchoscopy was terminated due to completion of the planned procedure and the bronchoscope was removed.   Total dosage of Lidocaine  was ZERO mg  Estimated Blood loss: EXPECTED < 5cc.  Complications included:  NONE   Preliminary CXR findings :  IN PROCESS  Disposition: HOME WITH FAMILY   Follow up with Dr. Jasmine Maceachern in 5 days for result discussion.     Halina Picking MD  [facility] Division of Pulmonary & Critical Care Medicine

## 2023-09-07 NOTE — Anesthesia Postprocedure Evaluation (Signed)
 Anesthesia Post Note  Patient: Imaya M Somma  Procedure(s) Performed: VIDEO BRONCHOSCOPY WITH ENDOBRONCHIAL NAVIGATION BRONCHOSCOPY, WITH EBUS  Patient location during evaluation: PACU Anesthesia Type: General Level of consciousness: awake and alert Pain management: pain level controlled Vital Signs Assessment: post-procedure vital signs reviewed and stable Respiratory status: spontaneous breathing, nonlabored ventilation, respiratory function stable and patient connected to nasal cannula oxygen Cardiovascular status: blood pressure returned to baseline and stable Postop Assessment: no apparent nausea or vomiting Anesthetic complications: no   No notable events documented.   Last Vitals:  Vitals:   09/07/23 1049 09/07/23 1101  BP:  131/70  Pulse: 72 74  Resp: 16 16  Temp:  (!) 36.3 C  SpO2: 94% 96%    Last Pain:  Vitals:   09/07/23 1101  TempSrc: Temporal  PainSc: 0-No pain                 Debby Mines

## 2023-09-08 ENCOUNTER — Encounter: Payer: Self-pay | Admitting: Pulmonary Disease

## 2023-09-10 NOTE — Progress Notes (Unsigned)
 PROVIDER NOTE: Interpretation of information contained herein should be left to medically-trained personnel. Specific patient instructions are provided elsewhere under Patient Instructions section of medical record. This document was created in part using AI and STT-dictation technology, any transcriptional errors that may result from this process are unintentional.  Patient: Vanessa Oneill  Service: E/M   PCP: Fernande Ophelia JINNY DOUGLAS, MD  DOB: 09-Nov-1950  DOS: 09/11/2023  Provider: Emmy MARLA Blanch, NP  MRN: [id number]  Delivery: Virtual Visit  Specialty: Interventional Pain Management  Type: Established Patient  Setting: Ambulatory outpatient facility  Specialty designation: 09  Referring Prov.: Fernande Ophelia JINNY DOUGLAS, MD  Location: Remote location       Virtual Encounter - Pain Management PROVIDER NOTE: Information contained herein reflects review and annotations entered in association with encounter. Interpretation of such information and data should be left to medically-trained personnel. Information provided to patient can be located elsewhere in the medical record under Patient Instructions. Document created using STT-dictation technology, any transcriptional errors that may result from process are unintentional.    Contact & Pharmacy Preferred: [phone] Home: [phone] (home) Mobile: [phone] (mobile) E-mail: [email or web address]  [facility] - [address] ([address]), [place] - [address] ([address]) [place] [address] Phone: [phone] Fax: [phone]   Pre-screening  Ms. Olivero offered in-person vs virtual encounter. She indicated preferring virtual for this encounter.   Reason COVID-19*  Social distancing based on CDC and AMA recommendations.   I contacted Tauriel M Balin on 09/11/2023 via telephone.      I clearly identified myself as Emmy MARLA Blanch, NP. I verified that I was speaking with the correct person using two identifiers (Name:  Vanessa Oneill, and date of birth: 1950-08-14).  Consent I sought verbal advanced consent from Huntley M Mangiaracina for virtual visit interactions. I informed Ms. Barrientez of possible security and privacy concerns, risks, and limitations associated with providing not-in-person medical evaluation and management services. I also informed Ms. Arriola of the availability of in-person appointments. Finally, I informed her that there would be a charge for the virtual visit and that she could be  personally, fully or partially, financially responsible for it. [name] expressed understanding and agreed to proceed.   Historic Elements   Ms. Vanessa Oneill is a 73 y.o. year old, female patient evaluated today after our last contact on Visit date not found. [name]  has a past medical history of Anxiety, Aortic atherosclerosis (HCC), Arthritis, Cerebral aneurysm, Cervical radiculitis, Colon adenomas, COPD (chronic obstructive pulmonary disease) (HCC), Coronary artery disease, Depression, DM (diabetes mellitus), type 2 (HCC), Dyspnea, ESBL (extended spectrum beta-lactamase) producing bacteria infection, Esophageal varices (HCC), Frequent falls, GERD (gastroesophageal reflux disease), Headache, Hilar adenopathy, Hyperlipidemia, Hypertension, IDA (iron deficiency anemia), Insomnia, Liver disease, Lumbar myelopathy (HCC), Lung nodules, Mitral valve regurgitation, 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HCC), Personal history of tobacco use, presenting hazards to health (11/12/2014), Portal venous hypertension (HCC), Raynaud's disease, Septic shock (HCC), Sleep apnea, Squamous cell carcinoma of right lung (HCC), Thrombocytopenia (HCC), and Tobacco use. She also  has a past surgical history that includes Cholecystectomy; Abdominal hysterectomy; Esophagogastroduodenoscopy (egd) with propofol  (N/A, 07/24/2016); Appendectomy; Tonsillectomy; Esophagogastroduodenoscopy (egd) with propofol  (N/A, 11/21/2016); Esophagogastroduodenoscopy  (egd) with propofol  (N/A, 02/05/2018); Colonoscopy with propofol  (N/A, 11/18/2018); Xi robotic assisted thoracoscopy- segmentectomy (Right, 03/23/2022); Intercostal nerve block (03/23/2022); Lymph node biopsy (03/23/2022); Colonoscopy with propofol  (N/A, 04/26/2022); Esophagogastroduodenoscopy (egd) with propofol  (N/A, 04/26/2022); Video bronchoscopy with endobronchial navigation (N/A, 09/07/2023); and Video bronchoscopy with endobronchial ultrasound (  N/A, 09/07/2023). Ms. Mcquaid has a current medication list which includes the following prescription(s): albuterol , cholecalciferol, cyclobenzaprine , gabapentin , hydrocodone -acetaminophen , insulin  nph human, ipratropium, melatonin, metformin , nadolol , omeprazole, ozempic (1 mg/dose), trazodone , [DISCONTINUED] fluticasone, and [DISCONTINUED] ropinirole . She  reports that she has been smoking cigarettes. She started smoking about 49 years ago. She has a 48 pack-year smoking history. She has never used smokeless tobacco. She reports that she does not drink alcohol and does not use drugs. Ms. Anspach is allergic to codeine, tylenol  with codeine #3 [acetaminophen -codeine], amoxicillin, lipitor [atorvastatin], lisinopril, and penicillins.  BMI: Estimated body mass index is 26.11 kg/m as calculated from the following:   Height as of 09/07/23: 5' 7 (1.702 m).   Weight as of 09/07/23: 166 lb 11.2 oz (75.6 kg). Last encounter: Visit date not found. Last procedure: Visit date not found.  HPI  Today, she is being contacted for a post-procedure assessment.  [name] underwent a diagnostic/therapeutic Lumbar Facet, Medial Branch Block(s) on August 22, 2023.  She reports initially 75% pain relief and functional improvement followed sustained 50% ongoing pain relief and functional improvement since the procedure.  The patient reports persistent headache and neck pain, likely associated with cervical facet joint pathology.  We discussed proceeding with cervical facet  medial branch block as well as an occipital nerve block for both diagnostic and therapeutic purposes.  Procedure Type: Lumbar Facet, Medial Branch Block(s) (w/ fluoroscopic mapping) #2  Laterality: Bilateral  Level: L3, L4, and L5 Medial Branch/Dorsal Rami Level(s). Injecting these levels blocks the L3-4 and L4-5 lumbar facet joints. Imaging: Fluoroscopic guidance Spinal ([id number]) Anesthesia: Local anesthesia (1-2% Lidocaine ) Sedation:                         DOS: 08/22/2023 Performed by: Wallie Sherry, MD   Primary Purpose: Diagnostic/Therapeutic Indications: Low back pain severe enough to impact quality of life or function. 1. Lumbar facet arthropathy   2. Lumbar spondylosis   3. Chronic pain syndrome     NAS-11 Pain score:        Pre-procedure: 7 /10        Post-procedure: 7 /10   Post-Procedure Evaluation    Effectiveness:  Initial hour after procedure: 75 % . Subsequent 4-6 hours post-procedure: 75 % . Analgesia past initial 6 hours: 50 % (ongoing.). Ongoing improvement:  Analgesic:  Ms. Desa underwent a diagnostic/therapeutic Lumbar Facet, Medial Branch Block(s) on August 22, 2023.  She reports initially 75% pain relief and functional improvement followed sustained 50% ongoing pain relief and functional improvement since the procedure. Function: Ms. Kolek reports improvement in function ROM: Ms. Chiasson reports improvement in ROM   Pharmacotherapy Assessment  Monitoring: [place] PMP: PDMP not reviewed this encounter.       Pharmacotherapy: No side-effects or adverse reactions reported. Compliance: No problems identified. Effectiveness: Clinically acceptable. Plan: Refer to POC.  UDS:  Summary  Date Value Ref Range Status  07/03/2023 FINAL  Final    Comment:    ==================================================================== Compliance Drug Analysis, Ur ==================================================================== Specimen Alert Note: Urinary  creatinine is low; ability to detect some drugs may be compromised. Interpret results with caution. (Creatinine) ==================================================================== Test                             Result       Flag       Units  Drug Present and Declared for Prescription Verification   Hydrocodone   913          EXPECTED   ng/mg creat   Hydromorphone                   638          EXPECTED   ng/mg creat   Norhydrocodone                 719          EXPECTED   ng/mg creat    Sources of hydrocodone  include scheduled prescription medications.    Hydromorphone  and norhydrocodone are expected metabolites of    hydrocodone . Hydromorphone  is also available as a scheduled    prescription medication.    Gabapentin                      PRESENT      EXPECTED   Acetaminophen                   PRESENT      EXPECTED  Drug Present not Declared for Prescription Verification   Doxylamine                     PRESENT      UNEXPECTED   Dextrorphan/Levorphanol        PRESENT      UNEXPECTED    Dextrorphan is an expected metabolite of dextromethorphan, an over-    the-counter or prescription cough suppressant. Levorphanol is a    scheduled prescription medication. Dextrorphan cannot be    distinguished from levorphanol by the method used for analysis.  Drug Absent but Declared for Prescription Verification   Baclofen                       Not Detected UNEXPECTED   Cyclobenzaprine                 Not Detected UNEXPECTED   Diclofenac                     Not Detected UNEXPECTED    Topical diclofenac, as indicated in the declared medication list, is    not always detected even when used as directed.  ==================================================================== Test                      Result    Flag   Units      Ref Range   Creatinine              16        LL     mg/dL      >=79 ==================================================================== Declared  Medications:  The flagging and interpretation on this report are based on the  following declared medications.  Unexpected results may arise from  inaccuracies in the declared medications.   **Note: The testing scope of this panel includes these medications:   Baclofen  Cyclobenzaprine   Gabapentin   Hydrocodone    **Note: The testing scope of this panel does not include small to  moderate amounts of these reported medications:   Acetaminophen   Topical Diclofenac   **Note: The testing scope of this panel does not include the  following reported medications:   Albuterol  (Ventolin  HFA)  Amlodipine  Fluticasone (Flonase)  Glimepiride  (Amaryl )  Insulin  (Novolin)  Ipratropium (Atrovent )  Metformin   Nadolol   Omeprazole  Tetracaine ==================================================================== For clinical consultation, please call [phone]. ====================================================================    No results found for: CBDTHCR,  D8THCCBX, D9THCCBX  Laboratory Chemistry Profile   Renal Lab Results  Component Value Date   BUN 9 07/26/2023   CREATININE 0.74 07/26/2023   GFRAA 56 (L) 08/12/2017   GFRNONAA >60 07/26/2023    Hepatic Lab Results  Component Value Date   AST 33 07/26/2023   ALT 17 07/26/2023   ALBUMIN  3.9 07/26/2023   ALKPHOS 119 07/26/2023   LIPASE 24 11/28/2016   AMMONIA 35 08/10/2017    Electrolytes Lab Results  Component Value Date   NA 134 (L) 07/26/2023   K 4.1 07/26/2023   CL 102 07/26/2023   CALCIUM 8.6 (L) 07/26/2023    Bone No results found for: VD25OH, VD125OH2TOT, CI6874NY7, CI7874NY7, 25OHVITD1, 25OHVITD2, 25OHVITD3, TESTOFREE, TESTOSTERONE  Inflammation (CRP: Acute Phase) (ESR: Chronic Phase) Lab Results  Component Value Date   CRP 16.5 (H) 10/23/2016   ESRSEDRATE 91 (H) 10/24/2016   LATICACIDVEN 1.6 08/10/2017         Note: Above Lab results reviewed.  Imaging  DG Chest Port 1  View CLINICAL DATA:  Status post bronchoscopy  EXAM: PORTABLE CHEST 1 VIEW  COMPARISON:  CT chest dated 09/05/2023  FINDINGS: Low lung volumes with bronchovascular crowding. Patchy bibasilar opacities. Diffuse peripheral reticulations. No pleural effusion or pneumothorax. Enlarged cardiomediastinal silhouette. No acute osseous abnormality.  IMPRESSION: 1. Low lung volumes with bronchovascular crowding. Patchy bibasilar opacities, likely atelectasis. 2. No pneumothorax. 3. Enlarged cardiomediastinal silhouette.  Electronically Signed   By: Limin  Xu M.D.   On: 09/07/2023 14:07 DG C-Arm 1-60 Min-No Report Fluoroscopy was utilized by the requesting physician.  No radiographic  interpretation.   Narrative & Impression  CLINICAL DATA:  Cervicalgia   EXAM: MRI CERVICAL SPINE WITHOUT CONTRAST   TECHNIQUE: Multiplanar, multisequence MR imaging of the cervical spine was performed. No intravenous contrast was administered.   COMPARISON:  None Available.   FINDINGS: Alignment: Grade 1 anterolisthesis of C3 on C4.   Vertebrae: Vertebral bodies demonstrate normal signal intensity. No acute fracture is identified.   Cord: Normal signal and morphology.   Posterior Fossa, vertebral arteries, paraspinal tissues: The visualized portions of the skull base and the posterior fossa are normal. No soft tissue abnormality is identified.   Disc levels:   C2-C3: The disk is normal in configuration. No facet arthropathy. No uncovertebral joint disease. No neuroforaminal stenosis. No spinal canal stenosis.   C3-C4: Disc osteophyte complex. Moderate left facet arthropathy. No uncovertebral joint disease. No neuroforaminal stenosis. No spinal canal stenosis.   C4-C5: Disc osteophyte complex. No facet arthropathy. No uncovertebral joint disease. No neuroforaminal stenosis. Mild spinal canal stenosis.   C5-C6: Disc osteophyte complex. No facet arthropathy. No uncovertebral joint  disease. No neuroforaminal stenosis. Mild spinal canal stenosis.   C6-C7: Disc osteophyte complex. Mild bilateral facet arthropathy. No uncovertebral joint disease. No neuroforaminal stenosis. Mild spinal canal stenosis.   C7-T1: The disk is normal in configuration. No facet arthropathy. No uncovertebral joint disease. No neuroforaminal stenosis. No spinal canal stenosis.   IMPRESSION: Mild canal stenoses C4-C5, C5-C6, and C6-C7 secondary to disc osteophyte complexes. No significant foraminal stenosis.     Electronically Signed   By: Clem Savory M.D.   On: 04/11/2023 09:36   Assessment  The primary encounter diagnosis was Cervical facet syndrome. Diagnoses of Occipital neuralgia of right side, Cervicalgia, and Chronic pain syndrome were also pertinent to this visit.  Plan of Care  Problem-specific:   The patient reports persistent headache and neck pain, likely associated with cervical facet joint pathology.  We discussed proceeding with cervical facet medial branch block as well as an occipital nerve block for both diagnostic and therapeutic purposes.  Plan: (ECT): (L) C-FCT#1, (B) Occipital nerve block # 1 with Dr. Marcelino   Ms. Keilah M Clubb has a current medication list which includes the following long-term medication(s): gabapentin , insulin  nph human, ipratropium, omeprazole, trazodone , [DISCONTINUED] fluticasone, and [DISCONTINUED] ropinirole .  Pharmacotherapy (Medications Ordered): No orders of the defined types were placed in this encounter.  Orders:  Orders Placed This Encounter  Procedures   GREATER OCCIPITAL NERVE BLOCK    Standing Status:   Future    Expiration Date:   12/11/2023    Scheduling Instructions:     Procedure: Occipital nerve block     Laterality: Midline     Sedation: With Sedation.     Timeframe: ASAA    Where will this procedure be performed?:   [facility]   CERVICAL FACET (MEDIAL BRANCH NERVE BLOCK)     Scheduling Instructions:      Side: Left-sided     Level: C7-T1 Facet joints (C6, C7, and TBD Medial Branch Nerves)     Sedation: Sedation      Timeframe: As soon as schedule allows    Where will this procedure be performed?:   [facility]   Follow-up plan:   No follow-ups on file.          Recent Visits Date Type Provider Dept  08/22/23 Procedure visit Marcelino Nurse, MD [facility]  08/13/23 Procedure visit Marcelino Nurse, MD [facility]  08/01/23 Procedure visit Marcelino Nurse, MD [facility]  07/03/23 Office Visit Marcelino Nurse, MD [facility]  Showing recent visits within past 90 days and meeting all other requirements Today's Visits Date Type Provider Dept  09/11/23 Office Visit Floella Ensz K, NP [facility]  Showing today's visits and meeting all other requirements Future Appointments Date Type Provider Dept  09/20/23 Appointment Huxton Glaus K, NP [facility]  Showing future appointments within next 90 days and meeting all other requirements  I discussed the assessment and treatment plan with the patient. The patient was provided an opportunity to ask questions and all were answered. The patient agreed with the plan and demonstrated an understanding of the instructions.  Patient advised to call back or seek an in-person evaluation if the symptoms or condition worsens.  Duration of encounter: 20 minutes.  Note by: Emmy MARLA Blanch, NP Date: 09/11/2023; Time: 1:35 PM

## 2023-09-11 ENCOUNTER — Ambulatory Visit: Attending: Student in an Organized Health Care Education/Training Program | Admitting: Nurse Practitioner

## 2023-09-11 DIAGNOSIS — G894 Chronic pain syndrome: Secondary | ICD-10-CM | POA: Diagnosis not present

## 2023-09-11 DIAGNOSIS — M47812 Spondylosis without myelopathy or radiculopathy, cervical region: Secondary | ICD-10-CM | POA: Diagnosis not present

## 2023-09-11 DIAGNOSIS — M5481 Occipital neuralgia: Secondary | ICD-10-CM

## 2023-09-11 DIAGNOSIS — M542 Cervicalgia: Secondary | ICD-10-CM | POA: Diagnosis not present

## 2023-09-11 LAB — SURGICAL PATHOLOGY

## 2023-09-11 LAB — CYTOLOGY - NON PAP

## 2023-09-12 ENCOUNTER — Encounter: Payer: Self-pay | Admitting: Pulmonary Disease

## 2023-09-12 NOTE — Patient Instructions (Signed)

## 2023-09-14 ENCOUNTER — Inpatient Hospital Stay: Attending: Oncology | Admitting: Oncology

## 2023-09-14 ENCOUNTER — Ambulatory Visit: Admitting: Oncology

## 2023-09-14 ENCOUNTER — Encounter: Payer: Self-pay | Admitting: Oncology

## 2023-09-14 VITALS — BP 121/71 | HR 72 | Temp 97.6°F | Resp 18 | Ht 67.0 in | Wt 172.0 lb

## 2023-09-14 DIAGNOSIS — E1159 Type 2 diabetes mellitus with other circulatory complications: Secondary | ICD-10-CM | POA: Diagnosis not present

## 2023-09-14 DIAGNOSIS — F17210 Nicotine dependence, cigarettes, uncomplicated: Secondary | ICD-10-CM | POA: Insufficient documentation

## 2023-09-14 DIAGNOSIS — K219 Gastro-esophageal reflux disease without esophagitis: Secondary | ICD-10-CM | POA: Insufficient documentation

## 2023-09-14 DIAGNOSIS — R911 Solitary pulmonary nodule: Secondary | ICD-10-CM | POA: Diagnosis not present

## 2023-09-14 DIAGNOSIS — K7460 Unspecified cirrhosis of liver: Secondary | ICD-10-CM | POA: Diagnosis not present

## 2023-09-14 DIAGNOSIS — I10 Essential (primary) hypertension: Secondary | ICD-10-CM | POA: Diagnosis not present

## 2023-09-14 DIAGNOSIS — C3431 Malignant neoplasm of lower lobe, right bronchus or lung: Secondary | ICD-10-CM | POA: Diagnosis not present

## 2023-09-14 DIAGNOSIS — Z801 Family history of malignant neoplasm of trachea, bronchus and lung: Secondary | ICD-10-CM | POA: Diagnosis not present

## 2023-09-14 DIAGNOSIS — Z85118 Personal history of other malignant neoplasm of bronchus and lung: Secondary | ICD-10-CM | POA: Insufficient documentation

## 2023-09-14 DIAGNOSIS — J449 Chronic obstructive pulmonary disease, unspecified: Secondary | ICD-10-CM | POA: Insufficient documentation

## 2023-09-14 DIAGNOSIS — D696 Thrombocytopenia, unspecified: Secondary | ICD-10-CM | POA: Diagnosis not present

## 2023-09-14 DIAGNOSIS — Z902 Acquired absence of lung [part of]: Secondary | ICD-10-CM | POA: Insufficient documentation

## 2023-09-14 DIAGNOSIS — Z79899 Other long term (current) drug therapy: Secondary | ICD-10-CM | POA: Diagnosis not present

## 2023-09-14 NOTE — Assessment & Plan Note (Signed)
 Due to splenomegaly.  Stable Observation.

## 2023-09-14 NOTE — Progress Notes (Addendum)
 Hematology/Oncology Progress note [phone] [phone]     Patient Care Team: Fernande Ophelia JINNY DOUGLAS, MD as PCP - General (Internal Medicine) Theotis Lavelle BRAVO, MD as Referring Physician (Specialist) Babara Call, MD as Consulting Physician (Oncology)  REFERRING PROVIDER: Dr. 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 pT1b pN0 Recent PET scan showed hypermetabolic mediastinal left hilar lymph nodes and bilateral pulmonary nodules.  Concerning for disease recurrence. She underwent biopsy via bronchoscopy.  Negative for malignancy. I discussed with pulmonology. Recommend to repeat a CT scan in 3 months for close monitoring.  Consider rebiopsy if disease progression.      COPD (chronic obstructive pulmonary disease) (HCC) Patient follows up with pulmonology Dr. Theotis.  Continue use of inhalers.  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HCC) With splenomegaly  follow-up with gastroenterology.  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  Stable Observation.   Orders Placed This Encounter  Procedures   CT Chest W Contrast    Standing Status:   Future    Expected Date:   12/14/2023    Expiration Date:   09/13/2024    If  indicated for the ordered procedure, I authorize the administration of contrast media per Radiology protocol:   Yes    Does the patient have a contrast media/X-ray dye allergy?:   No    Preferred imaging location?:   [facility]   Follow up after repeat CT All questions were answered. The patient knows to call the clinic with any problems, questions or concerns.  Call Babara, MD, PhD [facility] Hematology Oncology 09/14/2023    HISTORY OF PRESENTING ILLNESS:  Vanessa Oneill 73 y.o. 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  Interval history Patient seen today as follow-up for stage I right lung squamous cell cancer on surveillance. Patient previously followed up with Dr.Agrawal. Patient switched care to me on 07/26/2023 Extensive medical record review was performed by me   I have reviewed her chart and materials related to her cancer extensively and collaborated history with the patient. 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  Other lung nodules were stable.  Follow-up in 6 months was recommended.  Repeat CT chest done on 01/25/2022 showed growing right lower lobe nodule to 11.6 mm.  Liver cirrhosis and splenomegaly.   02/13/2022 PET scan   PET scan  showed 11.6 mm right lower lobe pulmonary nodule with SUV of 2.5.  Low level hypermetabolic some associated with 2.5 cm peripheral subsolid lesion with multiple air  cyst SUV of 3.2.  These may be infectious/inflammatory although low-grade neoplasm cannot be excluded.   03/23/2022 Definitive Surgery   S/p robotic assisted right lower lobe lateral basilar segmentectomy, lymph node sampling and  intercostal nerve blocks levels 4 through 10 by Dr. Kerrin   04/04/2022 Cancer Staging   Staging form: Lung, AJCC 8th Edition - Pathologic: Stage IA2 (pT1b, pN0, cM0) - Signed by Kerrin Elspeth BROCKS, MD on 04/04/2022    Pathology Results   A. RIGHT LUNG, LOWER LOBE, BRONCHIAL MARGIN, EXCISION: Negative for carcinoma, margin free  B. RIGHT LUNG, LOWER LOBE, LATERAL SEGMENT, NODULE, SEGMENTECTOMY: Invasive well to moderately differentiated squamous cell carcinoma Tumor measures 1.6 x 1.3 x 1.0 cm (pT1b) Margins free  C. LYMPH NODE, LEVEL 9, EXCISION: Benign anthracotic stroma Negative for lymph node  D. LYMPH NODE, LEVEL 7, EXCISION: One benign lymph node with adjacent calcified nodule, negative for carcinoma (0/1)  E. LYMPH NODE, LEVEL 7 #2, EXCISION: One benign lymph node, negative for carcinoma (0/1)  F. LYMPH NODE, LEVEL 12, EXCISION: One benign lymph node, negative for carcinoma (0/1)  G. LYMPH NODE, LEVEL 12 #2, EXCISION: One benign lymph node, negative for carcinoma (0/1)  H. LYMPH NODE, LEVEL 13, EXCISION: One benign lymph node, negative for carcinoma (0/1)  I. LYMPH NODE, LEVEL 13 #2, EXCISION: One benign lymph node, negative for carcinoma (0/1)  J. LYMPH NODE, LEVEL 13 #3, EXCISION: Mucus with admixed benign bronchial cells and pulmonary macrophages  K. LYMPH NODE, 10R, EXCISION: One benign lymph node, negative for carcinoma (0/1)  L. LYMPH NODE, 4R, EXCISION: One benign lymph node, negative for carcinoma (0/1)   ONCOLOGY TABLE:  LUNG: Resection  Synchronous Tumors: Not applicable Total Number of Primary Tumors: 1 Procedure: Segmentectomy with lymph node sampling Specimen Laterality: Right Tumor Focality: Unifocal Tumor Site: Lower lobe, lateral segment Tumor Size: 1.6 x 1.3 x 1.0 cm Histologic Type: Squamous cell carcinoma Visceral Pleura Invasion: Not identified Direct Invasion of Adjacent Structures: No adjacent structures  present Lymphovascular Invasion: Not identified Margins: All margins negative for invasive carcinoma      Closest Margin(s) to Invasive Carcinoma: 0.8 cm from stapled parenchymal margin Treatment Effect: No known presurgical therapy Regional Lymph Nodes:      Number of Lymph Nodes Involved: 0      Number of Lymph Nodes Examined: 9                      Nodal Sites Examined: Stations 4R, 10R, 7, 12R and 13R    07/10/2023 Imaging   CT chest with contrast showed  1. Status post right lower lobe wedge resection. 2. Progressive subpleural nodularity in the bilateral upper lobes, measuring 11-12 mm. Follow-up CT chest is suggested in 3-6 months. 3. Stable thick-walled lung cyst in the lateral left lower lobe, grossly unchanged.      Patient present to discuss bronchoscopy results.  She reports worse cough productive and small amount of blood.  Her pulmonologist has started her on a course of Z-Pak and prednisone .   MEDICAL HISTORY:  Past Medical History:  Diagnosis Date   Anxiety    Aortic atherosclerosis (HCC)    Arthritis    Cerebral aneurysm    Cervical radiculitis    Colon adenomas    COPD (chronic obstructive pulmonary disease) (HCC)    Coronary artery disease    Depression    DM (diabetes mellitus), type 2 (HCC)    Dyspnea    ESBL (extended spectrum beta-lactamase)  producing bacteria infection    Esophageal varices (HCC)    Frequent falls    GERD (gastroesophageal reflux disease)    Headache    Hilar adenopathy    Hyperlipidemia    Hypertension    IDA (iron deficiency anemia)    Insomnia    Liver disease    Lumbar myelopathy (HCC)    Lung nodules    Mitral valve regurgitation    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HCC)    Personal history of tobacco use, presenting hazards to health 11/12/2014   Portal venous hypertension (HCC)    Raynaud's disease    Septic shock (HCC)    Sleep apnea    does not use cpap   Squamous cell carcinoma of right lung (HCC)     Thrombocytopenia (HCC)    Tobacco use     SURGICAL HISTORY: Past Surgical History:  Procedure Laterality Date   ABDOMINAL HYSTERECTOMY     APPENDECTOMY     CHOLECYSTECTOMY     COLONOSCOPY WITH PROPOFOL  N/A 11/18/2018   Procedure: COLONOSCOPY WITH PROPOFOL ;  Surgeon: Toledo, Ladell POUR, MD;  Location: [facility] ENDOSCOPY;  Service: Gastroenterology;  Laterality: N/A;   COLONOSCOPY WITH PROPOFOL  N/A 04/26/2022   Procedure: COLONOSCOPY WITH PROPOFOL ;  Surgeon: Toledo, Ladell POUR, MD;  Location: [facility] ENDOSCOPY;  Service: Gastroenterology;  Laterality: N/A;   ESOPHAGOGASTRODUODENOSCOPY (EGD) WITH PROPOFOL  N/A 07/24/2016   Procedure: ESOPHAGOGASTRODUODENOSCOPY (EGD) WITH PROPOFOL ;  Surgeon: Therisa Bi, MD;  Location[facility] ENDOSCOPY;  Service: Endoscopy;  Laterality: N/A;   ESOPHAGOGASTRODUODENOSCOPY (EGD) WITH PROPOFOL  N/A 11/21/2016   Procedure: ESOPHAGOGASTRODUODENOSCOPY (EGD) WITH PROPOFOL ;  Surgeon: Toledo, Ladell POUR, MD;  Location: [facility] ENDOSCOPY;  Service: Gastroenterology;  Laterality: N/A;   ESOPHAGOGASTRODUODENOSCOPY (EGD) WITH PROPOFOL  N/A 02/05/2018   Procedure: ESOPHAGOGASTRODUODENOSCOPY (EGD) WITH PROPOFOL ;  Surgeon: Toledo, Ladell POUR, MD;  Location: [facility] ENDOSCOPY;  Service: Endoscopy;  Laterality: N/A;   ESOPHAGOGASTRODUODENOSCOPY (EGD) WITH PROPOFOL  N/A 04/26/2022   Procedure: ESOPHAGOGASTRODUODENOSCOPY (EGD) WITH PROPOFOL ;  Surgeon: Toledo, Ladell POUR, MD;  Location: [facility] ENDOSCOPY;  Service: Gastroenterology;  Laterality: N/A;   INTERCOSTAL NERVE BLOCK  03/23/2022   Procedure: INTERCOSTAL NERVE BLOCK;  Surgeon: Kerrin Elspeth BROCKS, MD;  Location: [facility] OR;  Service: Thoracic;;   LYMPH NODE BIOPSY  03/23/2022   Procedure: LYMPH NODE BIOPSY;  Surgeon: Kerrin Elspeth BROCKS, MD;  Location: [facility] OR;  Service: Thoracic;;   TONSILLECTOMY     VIDEO BRONCHOSCOPY WITH ENDOBRONCHIAL NAVIGATION N/A 09/07/2023   Procedure: VIDEO BRONCHOSCOPY WITH ENDOBRONCHIAL NAVIGATION;  Surgeon: Parris Manna,  MD;  Location: [facility] ORS;  Service: Thoracic;  Laterality: N/A;   VIDEO BRONCHOSCOPY WITH ENDOBRONCHIAL ULTRASOUND N/A 09/07/2023   Procedure: BRONCHOSCOPY, WITH EBUS;  Surgeon: Parris Manna, MD;  Location: [facility] ORS;  Service: Thoracic;  Laterality: N/A;   XI ROBOTIC ASSISTED THORACOSCOPY- SEGMENTECTOMY Right 03/23/2022   Procedure: XI ROBOTIC ASSISTED THORACOSCOPY-RIGHT LOWER LOBE LATERAL BASILAR SEGMENTECTOMY;  Surgeon: Kerrin Elspeth BROCKS, MD;  Location: [facility] OR;  Service: Thoracic;  Laterality: Right;    SOCIAL HISTORY: Social History   Socioeconomic History   Marital status: Widowed    Spouse name: Not on file   Number of children: 2   Years of education: Not on file   Highest education level: Not on file  Occupational History   Not on file  Tobacco Use   Smoking status: Every Day    Current packs/day: 0.00    Average packs/day: 1 pack/day for 48.0 years (48.0 ttl pk-yrs)    Types: Cigarettes    Start date: 03/23/1974  Last attempt to quit: 03/23/2022    Years since quitting: 1.4   Smokeless tobacco: Never   Tobacco comments:    patient refused  Vaping Use   Vaping status: Never Used  Substance and Sexual Activity   Alcohol use: No    Alcohol/week: 0.0 standard drinks of alcohol   Drug use: No   Sexual activity: Not Currently  Other Topics Concern   Not on file  Social History Narrative   Live at home with husband   Social Drivers of Corporate investment banker Strain: Low Risk  (04/24/2023)   Received from [facility]   Overall Financial Resource Strain (CARDIA)    Difficulty of Paying Living Expenses: Not hard at all  Food Insecurity: No Food Insecurity (04/24/2023)   Received from [facility]   Hunger Vital Sign    Within the past 12 months, you worried that your food would run out before you got the money to buy more.: Never true    Within the past 12 months, the food you bought just didn't last and you didn't have money to  get more.: Never true  Transportation Needs: No Transportation Needs (04/24/2023)   Received from [facility] - Transportation    In the past 12 months, has lack of transportation kept you from medical appointments or from getting medications?: No    Lack of Transportation (Non-Medical): No  Physical Activity: Inactive (02/06/2017)   Exercise Vital Sign    Days of Exercise per Week: 0 days    Minutes of Exercise per Session: 0 min  Stress: Stress Concern Present (02/06/2017)   [employer] of Occupational Health - Occupational Stress Questionnaire    Feeling of Stress : To some extent  Social Connections: Moderately Integrated (02/06/2017)   Social Connection and Isolation Panel    Frequency of Communication with Friends and Family: More than three times a week    Frequency of Social Gatherings with Friends and Family: Once a week    Attends Religious Services: More than 4 times per year    Active Member of [employer] or Organizations: No    Attends Banker Meetings: Never    Marital Status: Married  Catering manager Violence: Not At Risk (03/24/2022)   Humiliation, Afraid, Rape, and Kick questionnaire    Fear of Current or Ex-Partner: No    Emotionally Abused: No    Physically Abused: No    Sexually Abused: No    FAMILY HISTORY: Family History  Problem Relation Age of Onset   Lung cancer Father    Heart Problems Father    Heart failure Mother    Breast cancer Neg Hx     ALLERGIES:  is allergic to codeine, tylenol  with codeine #3 [acetaminophen -codeine], amoxicillin, lipitor [atorvastatin], lisinopril, and penicillins.  MEDICATIONS:  Current Outpatient Medications  Medication Sig Dispense Refill   albuterol  (VENTOLIN  HFA) 108 (90 Base) MCG/ACT inhaler Inhale 1-2 puffs into the lungs every 6 (six) hours as needed for wheezing or shortness of breath.     cholecalciferol (VITAMIN D3) 25 MCG (1000 UNIT) tablet Take 1,000 Units by mouth daily.      cyclobenzaprine  (FLEXERIL ) 5 MG tablet Take 5 mg by mouth 3 (three) times daily as needed for muscle spasms.     gabapentin  (NEURONTIN ) 300 MG capsule Take 1 capsule (300 mg total) by mouth 3 (three) times daily. Take 100 mg by mouth in the morning, 300 mg in  the afternoon and 100 mg at bedtime. 270 capsule 1   HYDROcodone -acetaminophen  (NORCO/VICODIN) 5-325 MG tablet Take 1 tablet by mouth 3 (three) times daily as needed for severe pain (pain score 7-10). Must last 30 days 90 tablet 0   insulin  NPH Human (HUMULIN N,NOVOLIN N) 100 UNIT/ML injection Inject 0.1 mLs (10 Units total) into the skin 2 (two) times daily before a meal. Use 30 units in the morning, 20 units during the day and 30 units at night. (Patient taking differently: Inject 10-20 Units into the skin 2 (two) times daily before a meal.) 10 mL 11   ipratropium (ATROVENT ) 0.06 % nasal spray Place 3 sprays into both nostrils daily.     Melatonin 10 MG CHEW Chew 2 tablets by mouth at bedtime.     metFORMIN  (GLUCOPHAGE -XR) 500 MG 24 hr tablet Take 500 mg by mouth 2 (two) times daily with a meal.     nadolol  (CORGARD ) 80 MG tablet Take 80 mg by mouth at bedtime.     omeprazole (PRILOSEC) 40 MG capsule Take 40 mg by mouth every morning.     Semaglutide, 1 MG/DOSE, (OZEMPIC, 1 MG/DOSE,) 2 MG/1.5ML SOPN Inject 2 mg into the skin once a week. Sundays     traZODone  (DESYREL ) 50 MG tablet Take 50 mg by mouth at bedtime.  0   No current facility-administered medications for this visit.    REVIEW OF SYSTEMS:   Pertinent information mentioned in HPI All other systems were reviewed with the patient and are negative.  PHYSICAL EXAMINATION: ECOG PERFORMANCE STATUS: 1 - Symptomatic but completely ambulatory  Vitals:   09/14/23 1153  BP: 121/71  Pulse: 72  Resp: 18  Temp: 97.6 F (36.4 C)  SpO2: 93%   Filed Weights   09/14/23 1153  Weight: 172 lb (78 kg)    Physical Exam Constitutional:      General: She is not in acute distress.     Appearance: She is not diaphoretic.  HENT:     Head: Normocephalic.  Eyes:     General: No scleral icterus. Cardiovascular:     Rate and Rhythm: Normal rate and regular rhythm.     Heart sounds: No murmur heard. Pulmonary:     Effort: Pulmonary effort is normal. No respiratory distress.     Comments: Decreased breath sound bilaterally Abdominal:     General: There is no distension.     Palpations: Abdomen is soft.     Tenderness: There is no abdominal tenderness.  Musculoskeletal:        General: Normal range of motion.     Cervical back: Normal range of motion and neck supple.  Skin:    General: Skin is warm and dry.     Findings: No erythema.  Neurological:     Mental Status: She is alert and oriented to person, place, and time. Mental status is at baseline.     Motor: No abnormal muscle tone.  Psychiatric:        Mood and Affect: Mood and affect normal.      LABORATORY DATA:  I have reviewed the data as listed Lab Results  Component Value Date   WBC 4.3 08/31/2023   HGB 11.2 (L) 08/31/2023   HCT 34.0 (L) 08/31/2023   MCV 84.4 08/31/2023   PLT 67 (L) 08/31/2023   Recent Labs    07/26/23 1359  NA 134*  K 4.1  CL 102  CO2 23  GLUCOSE 188*  BUN 9  CREATININE  0.74  CALCIUM 8.6*  GFRNONAA >60  PROT 7.1  ALBUMIN  3.9  AST 33  ALT 17  ALKPHOS 119  BILITOT 1.2    RADIOGRAPHIC STUDIES: I have personally reviewed the radiological images as listed and agreed with the findings in the report. DG Chest Port 1 View Result Date: 09/07/2023 CLINICAL DATA:  Status post bronchoscopy EXAM: PORTABLE CHEST 1 VIEW COMPARISON:  CT chest dated 09/05/2023 FINDINGS: Low lung volumes with bronchovascular crowding. Patchy bibasilar opacities. Diffuse peripheral reticulations. No pleural effusion or pneumothorax. Enlarged cardiomediastinal silhouette. No acute osseous abnormality. IMPRESSION: 1. Low lung volumes with bronchovascular crowding. Patchy bibasilar opacities, likely  atelectasis. 2. No pneumothorax. 3. Enlarged cardiomediastinal silhouette. Electronically Signed   By: Limin  Xu M.D.   On: 09/07/2023 14:07   DG C-Arm 1-60 Min-No Report Result Date: 09/07/2023 Fluoroscopy was utilized by the requesting physician.  No radiographic interpretation.   CT SUPER D CHEST WO MONARCH PILOT Result Date: 09/05/2023 CLINICAL DATA:  Bilateral pulmonary nodules. Concern for metastatic disease. EXAM: CT CHEST WITHOUT CONTRAST TECHNIQUE: Multidetector CT imaging of the chest was performed using thin slice collimation for electromagnetic bronchoscopy planning purposes, without intravenous contrast. RADIATION DOSE REDUCTION: This exam was performed according to the departmental dose-optimization program which includes automated exposure control, adjustment of the mA and/or kV according to patient size and/or use of iterative reconstruction technique. COMPARISON:  PET-CT 08/06/2023 FINDINGS: Cardiovascular: Coronary artery calcification and aortic atherosclerotic calcification. Mediastinum/Nodes: Prominent paratracheal lymph nodes. Largest node measures 11 mm in the RIGHT lower paratracheal nodal station. Lungs/Pleura: RIGHT upper lobe posterior medial nodule measures 12 mm image 41/4). Anterior LEFT upper lobe nodule measures 9 mm on image 60/4. Lateral cavitary nodule in the LEFT lower lobe measures 20 mm image 78. There is extensive subpleural reticulation and cystic change throughout the lungs. Upper Abdomen: Limited view of the liver, kidneys, pancreas are unremarkable. Normal adrenal glands. Splenomegaly and nodular contour of the liver. Low-density cystic collection adjacent to the distal esophagus. 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 Adrenal glands normal. Musculoskeletal: No aggressive osseous lesion. IMPRESSION: 1. Bilateral pulmonary nodules. Nodules moderately hypermetabolic on comparison FDG PET scan. 2. Chronic interstitial lung disease. 3. Borderline  enlarged mediastinal lymph nodes. 4. Cirrhotic liver morphology with splenomegaly. Electronically Signed   By: Jackquline Boxer M.D.   On: 09/05/2023 17:05   DG PAIN CLINIC C-ARM 1-60 MIN NO REPORT Result Date: 08/22/2023 Fluoro was used, but no Radiologist interpretation will be provided. Please refer to NOTES tab for provider progress note.

## 2023-09-14 NOTE — Assessment & Plan Note (Signed)
 With splenomegaly  follow-up with gastroenterology.

## 2023-09-14 NOTE — Assessment & Plan Note (Addendum)
 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 pT1b pN0 Recent PET scan showed hypermetabolic mediastinal left hilar lymph nodes and bilateral pulmonary nodules.  Concerning for disease recurrence. She underwent biopsy via bronchoscopy.  Negative for malignancy. I discussed with pulmonology. Recommend to repeat a CT scan in 3 months for close monitoring.  Consider rebiopsy if disease progression.

## 2023-09-14 NOTE — Assessment & Plan Note (Signed)
 Patient follows up with pulmonology Dr. Theotis.  Continue use of inhalers.

## 2023-09-19 ENCOUNTER — Encounter: Payer: Self-pay | Admitting: Oncology

## 2023-09-20 ENCOUNTER — Ambulatory Visit (HOSPITAL_BASED_OUTPATIENT_CLINIC_OR_DEPARTMENT_OTHER): Admitting: Nurse Practitioner

## 2023-09-20 DIAGNOSIS — G894 Chronic pain syndrome: Secondary | ICD-10-CM

## 2023-09-20 DIAGNOSIS — K219 Gastro-esophageal reflux disease without esophagitis: Secondary | ICD-10-CM | POA: Diagnosis not present

## 2023-09-20 DIAGNOSIS — K7460 Unspecified cirrhosis of liver: Secondary | ICD-10-CM | POA: Diagnosis not present

## 2023-09-20 DIAGNOSIS — F17210 Nicotine dependence, cigarettes, uncomplicated: Secondary | ICD-10-CM | POA: Diagnosis not present

## 2023-09-20 DIAGNOSIS — Z79899 Other long term (current) drug therapy: Secondary | ICD-10-CM | POA: Diagnosis not present

## 2023-09-20 DIAGNOSIS — E1159 Type 2 diabetes mellitus with other circulatory complications: Secondary | ICD-10-CM | POA: Diagnosis not present

## 2023-09-20 DIAGNOSIS — D696 Thrombocytopenia, unspecified: Secondary | ICD-10-CM | POA: Diagnosis not present

## 2023-09-20 DIAGNOSIS — Z91199 Patient's noncompliance with other medical treatment and regimen due to unspecified reason: Secondary | ICD-10-CM

## 2023-09-20 DIAGNOSIS — Z85118 Personal history of other malignant neoplasm of bronchus and lung: Secondary | ICD-10-CM | POA: Diagnosis not present

## 2023-09-20 DIAGNOSIS — I10 Essential (primary) hypertension: Secondary | ICD-10-CM | POA: Diagnosis not present

## 2023-09-20 DIAGNOSIS — J449 Chronic obstructive pulmonary disease, unspecified: Secondary | ICD-10-CM | POA: Diagnosis not present

## 2023-09-20 NOTE — Progress Notes (Signed)
 09/20/2023-No show

## 2023-09-25 DIAGNOSIS — J300 Vasomotor rhinitis: Secondary | ICD-10-CM | POA: Diagnosis not present

## 2023-09-25 DIAGNOSIS — F17200 Nicotine dependence, unspecified, uncomplicated: Secondary | ICD-10-CM | POA: Diagnosis not present

## 2023-09-25 DIAGNOSIS — T17500A Unspecified foreign body in bronchus causing asphyxiation, initial encounter: Secondary | ICD-10-CM | POA: Diagnosis not present

## 2023-09-28 DIAGNOSIS — E118 Type 2 diabetes mellitus with unspecified complications: Secondary | ICD-10-CM | POA: Diagnosis not present

## 2023-10-01 DIAGNOSIS — Z79891 Long term (current) use of opiate analgesic: Secondary | ICD-10-CM | POA: Diagnosis not present

## 2023-10-01 DIAGNOSIS — E7849 Other hyperlipidemia: Secondary | ICD-10-CM | POA: Diagnosis not present

## 2023-10-01 DIAGNOSIS — D696 Thrombocytopenia, unspecified: Secondary | ICD-10-CM | POA: Diagnosis not present

## 2023-10-01 DIAGNOSIS — E118 Type 2 diabetes mellitus with unspecified complications: Secondary | ICD-10-CM | POA: Diagnosis not present

## 2023-10-03 ENCOUNTER — Ambulatory Visit: Attending: Nurse Practitioner | Admitting: Student in an Organized Health Care Education/Training Program

## 2023-10-08 ENCOUNTER — Ambulatory Visit: Attending: Nurse Practitioner | Admitting: Nurse Practitioner

## 2023-10-08 ENCOUNTER — Encounter: Payer: Self-pay | Admitting: Nurse Practitioner

## 2023-10-08 VITALS — BP 136/82 | HR 74 | Temp 98.1°F | Resp 16 | Ht 67.0 in | Wt 155.0 lb

## 2023-10-08 DIAGNOSIS — G894 Chronic pain syndrome: Secondary | ICD-10-CM | POA: Diagnosis not present

## 2023-10-08 DIAGNOSIS — M5481 Occipital neuralgia: Secondary | ICD-10-CM | POA: Diagnosis not present

## 2023-10-08 DIAGNOSIS — M542 Cervicalgia: Secondary | ICD-10-CM | POA: Insufficient documentation

## 2023-10-08 DIAGNOSIS — Z79899 Other long term (current) drug therapy: Secondary | ICD-10-CM | POA: Insufficient documentation

## 2023-10-08 DIAGNOSIS — M47816 Spondylosis without myelopathy or radiculopathy, lumbar region: Secondary | ICD-10-CM | POA: Diagnosis not present

## 2023-10-08 DIAGNOSIS — M47812 Spondylosis without myelopathy or radiculopathy, cervical region: Secondary | ICD-10-CM | POA: Diagnosis not present

## 2023-10-08 MED ORDER — HYDROCODONE-ACETAMINOPHEN 5-325 MG PO TABS: 1.0000 | ORAL_TABLET | Freq: Three times a day (TID) | ORAL | 0 refills | Status: DC | PRN

## 2023-10-08 NOTE — Progress Notes (Signed)
 Nursing Pain Medication Assessment:  Safety precautions to be maintained throughout the outpatient stay will include: orient to surroundings, keep bed in low position, maintain call bell within reach at all times, provide assistance with transfer out of bed and ambulation.  Medication Inspection Compliance: Pill count conducted under aseptic conditions, in front of the patient. Neither the pills nor the bottle was removed from the patient's sight at any time. Once count was completed pills were immediately returned to the patient in their original bottle.  Medication: Hydrocodone /APAP Pill/Patch Count: 2.5 of 90 pills/patches remain Pill/Patch Appearance: Markings consistent with prescribed medication Bottle Appearance: Standard pharmacy container. Clearly labeled. Filled Date: 08 / 29 / 2025 Last Medication intake:  Today

## 2023-10-08 NOTE — Progress Notes (Signed)
 PROVIDER NOTE: Interpretation of information contained herein should be left to medically-trained personnel. Specific patient instructions are provided elsewhere under Patient Instructions section of medical record. This document was created in part using AI and STT-dictation technology, any transcriptional errors that may result from this process are unintentional.  Patient: Vanessa Oneill  Service: E/M   PCP: Fernande Ophelia JINNY DOUGLAS, MD  DOB: [birth year]  DOS: 10/08/2023  Provider: Emmy MARLA Blanch, NP  MRN: [id number]  Delivery: Face-to-face  Specialty: Interventional Pain Management  Type: Established Patient  Setting: Ambulatory outpatient facility  Specialty designation: 09  Referring Prov.: Fernande Ophelia JINNY DOUGLAS, MD  Location: Outpatient office facility       History of present illness (HPI) Vanessa Oneill, a 73 y.o. year old female, is here today because of her neck pain. [name] primary complain today is Neck Pain (Right and occassionally )  Pertinent problems: [name] has 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); Degeneration of intervertebral disc of lumbar region with discogenic back pain; Chronic pain syndrome; Chronic painful diabetic neuropathy (HCC); and Occipital neuralgia of right side on their problem list.  Pain Assessment: Severity of Chronic pain is reported as a 7 /10. Location: Neck Right/travels up into the right side of head. Onset: More than a month ago. Quality: Aching, Constant, Discomfort, Sore. Timing: Constant. Modifying factor(s): nothing currently. Vitals:  height is 5' 7 (1.702 m) and weight is 155 lb (70.3 kg). Her temporal temperature is 98.1 F (36.7 C). Her blood pressure is 136/82 and her pulse is 74. Her respiration is 16 and oxygen saturation is 96%.  BMI: Estimated body mass index is 24.28 kg/m as calculated from the following:   Height as of this encounter: 5' 7 (1.702 m).   Weight as of this encounter: 155 lb (70.3 kg).  Last  encounter: 09/20/2023. Last procedure: Visit date not found.  Reason for encounter: medication management. No change in medical history since last visit.  Patient's pain is at baseline.  Patient continues multimodal pain regimen as prescribed.  States that it provides pain relief and improvement in functional status.  Pharmacotherapy Assessment   Norco 5-325 mg tablet 3 times daily as needed for severe pain. MME=15 Monitoring:  PMP: PDMP reviewed during this encounter.       Pharmacotherapy: No side-effects or adverse reactions reported. Compliance: No problems identified. Effectiveness: Clinically acceptable.  Jakie Chrissie MATSU, RN  10/08/2023  1:13 PM  Sign when Signing Visit Nursing Pain Medication Assessment:  Safety precautions to be maintained throughout the outpatient stay will include: orient to surroundings, keep bed in low position, maintain call bell within reach at all times, provide assistance with transfer out of bed and ambulation.  Medication Inspection Compliance: Pill count conducted under aseptic conditions, in front of the patient. Neither the pills nor the bottle was removed from the patient's sight at any time. Once count was completed pills were immediately returned to the patient in their original bottle.  Medication: Hydrocodone /APAP Pill/Patch Count: 2.5 of 90 pills/patches remain Pill/Patch Appearance: Markings consistent with prescribed medication Bottle Appearance: Standard pharmacy container. Clearly labeled. Filled Date: 08 / 29 / 2025 Last Medication intake:  Today    UDS:  Summary  Date Value Ref Range Status  07/03/2023 FINAL  Final    Comment:    ==================================================================== Compliance Drug Analysis, Ur ==================================================================== Specimen Alert Note: Urinary creatinine is low; ability to detect some drugs may be compromised. Interpret results with caution.  (  Creatinine) ==================================================================== Test                             Result       Flag       Units  Drug Present and Declared for Prescription Verification   Hydrocodone                     913          EXPECTED   ng/mg creat   Hydromorphone                   638          EXPECTED   ng/mg creat   Norhydrocodone                 719          EXPECTED   ng/mg creat    Sources of hydrocodone  include scheduled prescription medications.    Hydromorphone  and norhydrocodone are expected metabolites of    hydrocodone . Hydromorphone  is also available as a scheduled    prescription medication.    Gabapentin                      PRESENT      EXPECTED   Acetaminophen                   PRESENT      EXPECTED  Drug Present not Declared for Prescription Verification   Doxylamine                     PRESENT      UNEXPECTED   Dextrorphan/Levorphanol        PRESENT      UNEXPECTED    Dextrorphan is an expected metabolite of dextromethorphan, an over-    the-counter or prescription cough suppressant. Levorphanol is a    scheduled prescription medication. Dextrorphan cannot be    distinguished from levorphanol by the method used for analysis.  Drug Absent but Declared for Prescription Verification   Baclofen                       Not Detected UNEXPECTED   Cyclobenzaprine                 Not Detected UNEXPECTED   Diclofenac                     Not Detected UNEXPECTED    Topical diclofenac, as indicated in the declared medication list, is    not always detected even when used as directed.  ==================================================================== Test                      Result    Flag   Units      Ref Range   Creatinine              16        LL     mg/dL      >=79 ==================================================================== Declared Medications:  The flagging and interpretation on this report are based on the  following declared medications.   Unexpected results may arise from  inaccuracies in the declared medications.   **Note: The testing scope of this panel includes these medications:   Baclofen  Cyclobenzaprine   Gabapentin   Hydrocodone    **Note: The testing scope of this panel does not include  small to  moderate amounts of these reported medications:   Acetaminophen   Topical Diclofenac   **Note: The testing scope of this panel does not include the  following reported medications:   Albuterol  (Ventolin  HFA)  Amlodipine  Fluticasone (Flonase)  Glimepiride  (Amaryl )  Insulin  (Novolin)  Ipratropium (Atrovent )  Metformin   Nadolol   Omeprazole  Tetracaine ==================================================================== For clinical consultation, please call [phone]. ====================================================================     No results found for: CBDTHCR No results found for: D8THCCBX No results found for: D9THCCBX  ROS  Constitutional: Denies any fever or chills Gastrointestinal: No reported hemesis, hematochezia, vomiting, or acute GI distress Musculoskeletal: Neck pain (Right and occasionally) Neurological: No reported episodes of acute onset apraxia, aphasia, dysarthria, agnosia, amnesia, paralysis, loss of coordination, or loss of consciousness  Medication Review  HYDROcodone -acetaminophen , Melatonin, Semaglutide (1 MG/DOSE), albuterol , cholecalciferol, cyclobenzaprine , fluticasone, gabapentin , insulin  NPH Human, ipratropium, metFORMIN , nadolol , omeprazole, rOPINIRole , and traZODone   History Review  Allergy: Ms. Broerman is allergic to codeine, tylenol  with codeine #3 [acetaminophen -codeine], amoxicillin, lipitor [atorvastatin], lisinopril, and penicillins. Drug: Ms. Tetro  reports no history of drug use. Alcohol:  reports no history of alcohol use. Tobacco:  reports that she has been smoking cigarettes. She started smoking about 49 years ago. She has a 48 pack-year  smoking history. She has never used smokeless tobacco. Social: Ms. Lindenbaum  reports that she has been smoking cigarettes. She started smoking about 49 years ago. She has a 48 pack-year smoking history. She has never used smokeless tobacco. She reports that she does not drink alcohol and does not use drugs. Medical:  has a past medical history of Anxiety, Aortic atherosclerosis, Arthritis, Cerebral aneurysm, Cervical radiculitis, Colon adenomas, COPD (chronic obstructive pulmonary disease) (HCC), Coronary artery disease, Depression, DM (diabetes mellitus), type 2 (HCC), Dyspnea, ESBL (extended spectrum beta-lactamase) producing bacteria infection, Esophageal varices (HCC), Frequent falls, GERD (gastroesophageal reflux disease), Headache, Hilar adenopathy, Hyperlipidemia, Hypertension, IDA (iron deficiency anemia), Insomnia, Liver disease, Lumbar myelopathy (HCC), Lung nodules, Mitral valve regurgitation, 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HCC), Personal history of tobacco use, presenting hazards to health (11/12/2014), Portal venous hypertension (HCC), Raynaud's disease, Septic shock (HCC), Sleep apnea, Squamous cell carcinoma of right lung (HCC), Thrombocytopenia, and Tobacco use. Surgical: Ms. Kettering  has a past surgical history that includes Cholecystectomy; Abdominal hysterectomy; Esophagogastroduodenoscopy (egd) with propofol  (N/A, 07/24/2016); Appendectomy; Tonsillectomy; Esophagogastroduodenoscopy (egd) with propofol  (N/A, 11/21/2016); Esophagogastroduodenoscopy (egd) with propofol  (N/A, 02/05/2018); Colonoscopy with propofol  (N/A, 11/18/2018); Xi robotic assisted thoracoscopy- segmentectomy (Right, 03/23/2022); Intercostal nerve block (03/23/2022); Lymph node biopsy (03/23/2022); Colonoscopy with propofol  (N/A, 04/26/2022); Esophagogastroduodenoscopy (egd) with propofol  (N/A, 04/26/2022); Video bronchoscopy with endobronchial navigation (N/A, 09/07/2023); and Video bronchoscopy with endobronchial ultrasound (N/A,  09/07/2023). Family: family history includes Heart Problems in her father; Heart failure in her mother; Lung cancer in her father.  Laboratory Chemistry Profile   Renal Lab Results  Component Value Date   BUN 9 07/26/2023   CREATININE 0.74 07/26/2023   GFRAA 56 (L) 08/12/2017   GFRNONAA >60 07/26/2023    Hepatic Lab Results  Component Value Date   AST 33 07/26/2023   ALT 17 07/26/2023   ALBUMIN  3.9 07/26/2023   ALKPHOS 119 07/26/2023   LIPASE 24 11/28/2016   AMMONIA 35 08/10/2017    Electrolytes Lab Results  Component Value Date   NA 134 (L) 07/26/2023   K 4.1 07/26/2023   CL 102 07/26/2023   CALCIUM 8.6 (L) 07/26/2023    Bone No results found for: VD25OH, VD125OH2TOT, CI6874NY7, CI7874NY7, 25OHVITD1, 25OHVITD2, 25OHVITD3, TESTOFREE, TESTOSTERONE  Inflammation (CRP: Acute Phase) (ESR: Chronic Phase) Lab Results  Component Value Date   CRP 16.5 (H) 10/23/2016   ESRSEDRATE 91 (H) 10/24/2016   LATICACIDVEN 1.6 08/10/2017         Note: Above Lab results reviewed.  Recent Imaging Review  DG Chest Port 1 View CLINICAL DATA:  Status post bronchoscopy  EXAM: PORTABLE CHEST 1 VIEW  COMPARISON:  CT chest dated 09/05/2023  FINDINGS: Low lung volumes with bronchovascular crowding. Patchy bibasilar opacities. Diffuse peripheral reticulations. No pleural effusion or pneumothorax. Enlarged cardiomediastinal silhouette. No acute osseous abnormality.  IMPRESSION: 1. Low lung volumes with bronchovascular crowding. Patchy bibasilar opacities, likely atelectasis. 2. No pneumothorax. 3. Enlarged cardiomediastinal silhouette.  Electronically Signed   By: Limin  Xu M.D.   On: 09/07/2023 14:07 DG C-Arm 1-60 Min-No Report Fluoroscopy was utilized by the requesting physician.  No radiographic  interpretation.  Note: Reviewed        MR CERVICAL SPINE WO CONTRAST   Narrative CLINICAL DATA:  Cervicalgia   EXAM: MRI CERVICAL SPINE WITHOUT CONTRAST    TECHNIQUE: Multiplanar, multisequence MR imaging of the cervical spine was performed. No intravenous contrast was administered.   COMPARISON:  None Available.   FINDINGS: Alignment: Grade 1 anterolisthesis of C3 on C4.   Vertebrae: Vertebral bodies demonstrate normal signal intensity. No acute fracture is identified.   Cord: Normal signal and morphology.   Posterior Fossa, vertebral arteries, paraspinal tissues: The visualized portions of the skull base and the posterior fossa are normal. No soft tissue abnormality is identified.   Disc levels:   C2-C3: The disk is normal in configuration. No facet arthropathy. No uncovertebral joint disease. No neuroforaminal stenosis. No spinal canal stenosis.   C3-C4: Disc osteophyte complex. Moderate left facet arthropathy. No uncovertebral joint disease. No neuroforaminal stenosis. No spinal canal stenosis.   C4-C5: Disc osteophyte complex. No facet arthropathy. No uncovertebral joint disease. No neuroforaminal stenosis. Mild spinal canal stenosis.   C5-C6: Disc osteophyte complex. No facet arthropathy. No uncovertebral joint disease. No neuroforaminal stenosis. Mild spinal canal stenosis.   C6-C7: Disc osteophyte complex. Mild bilateral facet arthropathy. No uncovertebral joint disease. No neuroforaminal stenosis. Mild spinal canal stenosis.   C7-T1: The disk is normal in configuration. No facet arthropathy. No uncovertebral joint disease. No neuroforaminal stenosis. No spinal canal stenosis.   IMPRESSION: Mild canal stenoses C4-C5, C5-C6, and C6-C7 secondary to disc osteophyte complexes. No significant foraminal stenosis.     Electronically Signed By: Clem Savory M.D. On: 04/11/2023 09:36  Physical Exam  Vitals: BP 136/82 (BP Location: Right Arm, Patient Position: Sitting, Cuff Size: Normal)   Pulse 74   Temp 98.1 F (36.7 C) (Temporal)   Resp 16   Ht 5' 7 (1.702 m)   Wt 155 lb (70.3 kg)   SpO2 96%   BMI 24.28  kg/m  BMI: Estimated body mass index is 24.28 kg/m as calculated from the following:   Height as of this encounter: 5' 7 (1.702 m).   Weight as of this encounter: 155 lb (70.3 kg). Ideal: Ideal body weight: 61.6 kg (135 lb 12.9 oz) Adjusted ideal body weight: 65.1 kg (143 lb 7.7 oz) General appearance: Well nourished, well developed, and well hydrated. In no apparent acute distress Mental status: Alert, oriented x 3 (person, place, & time)       Respiratory: No evidence of acute respiratory distress Eyes: PERLA   Assessment   Diagnosis Status  1. Chronic pain syndrome   2. Cervical facet syndrome  3. Lumbar facet arthropathy   4. Cervicalgia   5. Occipital neuralgia of right side   6. Medication management    Controlled Controlled Controlled   Updated Problems: No problems updated.  Plan of Care  Problem-specific:  Assessment and Plan  Chronic pain syndrome: Patient's pain is controlled with hydrocodone , we will continue on current medication regimen.  Prescribing drug monitoring (PDMP) reviewed, findings consistent with the use of prescribed medication and no evidence of narcotic misuse or abuse.  Urine drug screening (UDS) up-to-date.  Schedule follow-up in 30 days for medication management.  Advised patient to drink more water to prevent from opioid induced constipation.  Cervical facet syndrome: The patient continues experiencing neck pain with radiation occasionally radiation on right side.  Her cervical MRI shows mild canal stenosis C4-C5, C5-C6, and C6-7 secondary to disc osteophyte complexes, which causes neck pain.   Ms. Garland M Stayer has a current medication list which includes the following long-term medication(s): gabapentin , insulin  nph human, ipratropium, omeprazole, trazodone , [DISCONTINUED] fluticasone, and [DISCONTINUED] ropinirole .  Pharmacotherapy (Medications Ordered): Meds ordered this encounter  Medications   HYDROcodone -acetaminophen   (NORCO/VICODIN) 5-325 MG tablet    Sig: Take 1 tablet by mouth 3 (three) times daily as needed for severe pain (pain score 7-10). Must last 30 days    Dispense:  90 tablet    Refill:  0    Chronic Pain: STOP Act (Not applicable) Fill 1 day early if closed on refill date. Avoid benzodiazepines within 8 hours of opioids   Orders:  No orders of the defined types were placed in this encounter.     Return in about 1 month (around 11/08/2023) for (F2F), (MM), Emmy Blanch NP.    Recent Visits Date Type Provider Dept  10/03/23 Appointment Marcelino Nurse, MD [facility]  09/11/23 Office Visit Raylea Adcox K, NP [facility]  08/22/23 Procedure visit Marcelino Nurse, MD [facility]  08/13/23 Procedure visit Marcelino Nurse, MD [facility]  08/01/23 Procedure visit Marcelino Nurse, MD [facility]  Showing recent visits within past 90 days and meeting all other requirements Today's Visits Date Type Provider Dept  10/08/23 Office Visit Braedin Millhouse K, NP [facility]  Showing today's visits and meeting all other requirements Future Appointments Date Type Provider Dept  10/31/23 Appointment Marcelino Nurse, MD [facility]  11/07/23 Appointment Trenese Haft K, NP [facility]  Showing future appointments within next 90 days and meeting all other requirements  I discussed the assessment and treatment plan with the patient. The patient was provided an opportunity to ask questions and all were answered. The patient agreed with the plan and demonstrated an understanding of the instructions.  Patient advised to call back or seek an in-person evaluation if the symptoms or condition worsens.  Duration of encounter: 30 minutes.  Total time on encounter, as per AMA guidelines included both the face-to-face and non-face-to-face time personally spent by the physician and/or other qualified health care professional(s) on the day of the  encounter (includes time in activities that require the physician or other qualified health care professional and does not include time in activities normally performed by clinical staff). Physician's time may include the following activities when performed: Preparing to see the patient (e.g., pre-charting review of records, searching for previously ordered imaging, lab work, and nerve conduction tests) Review of prior analgesic pharmacotherapies. Reviewing PMP Interpreting ordered tests (e.g., lab work, imaging, nerve conduction tests) Performing post-procedure evaluations, including interpretation of  diagnostic procedures Obtaining and/or reviewing separately obtained history Performing a medically appropriate examination and/or evaluation Counseling and educating the patient/family/caregiver Ordering medications, tests, or procedures Referring and communicating with other health care professionals (when not separately reported) Documenting clinical information in the electronic or other health record Independently interpreting results (not separately reported) and communicating results to the patient/ family/caregiver Care coordination (not separately reported)  Note by: Makenlee Mckeag K Buryl Bamber, NP (TTS and AI technology used. I apologize for any typographical errors that were not detected and corrected.) Date: 10/08/2023; Time: 3:57 PM

## 2023-10-09 DIAGNOSIS — M545 Low back pain, unspecified: Secondary | ICD-10-CM | POA: Diagnosis not present

## 2023-10-09 DIAGNOSIS — I700 Atherosclerosis of aorta: Secondary | ICD-10-CM | POA: Diagnosis not present

## 2023-10-09 DIAGNOSIS — C3431 Malignant neoplasm of lower lobe, right bronchus or lung: Secondary | ICD-10-CM | POA: Diagnosis not present

## 2023-10-09 DIAGNOSIS — J449 Chronic obstructive pulmonary disease, unspecified: Secondary | ICD-10-CM | POA: Diagnosis not present

## 2023-10-09 DIAGNOSIS — J438 Other emphysema: Secondary | ICD-10-CM | POA: Diagnosis not present

## 2023-10-09 DIAGNOSIS — E119 Type 2 diabetes mellitus without complications: Secondary | ICD-10-CM | POA: Diagnosis not present

## 2023-10-09 DIAGNOSIS — D696 Thrombocytopenia, unspecified: Secondary | ICD-10-CM | POA: Diagnosis not present

## 2023-10-09 DIAGNOSIS — M2550 Pain in unspecified joint: Secondary | ICD-10-CM | POA: Diagnosis not present

## 2023-10-09 DIAGNOSIS — K766 Portal hypertension: Secondary | ICD-10-CM | POA: Diagnosis not present

## 2023-10-09 DIAGNOSIS — I10 Essential (primary) hypertension: Secondary | ICD-10-CM | POA: Diagnosis not present

## 2023-10-09 DIAGNOSIS — R202 Paresthesia of skin: Secondary | ICD-10-CM | POA: Diagnosis not present

## 2023-10-09 DIAGNOSIS — M542 Cervicalgia: Secondary | ICD-10-CM | POA: Diagnosis not present

## 2023-10-09 DIAGNOSIS — F17200 Nicotine dependence, unspecified, uncomplicated: Secondary | ICD-10-CM | POA: Diagnosis not present

## 2023-10-09 DIAGNOSIS — R300 Dysuria: Secondary | ICD-10-CM | POA: Diagnosis not present

## 2023-10-09 DIAGNOSIS — Z1331 Encounter for screening for depression: Secondary | ICD-10-CM | POA: Diagnosis not present

## 2023-10-09 DIAGNOSIS — G720 Drug-induced myopathy: Secondary | ICD-10-CM | POA: Diagnosis not present

## 2023-10-09 DIAGNOSIS — K7460 Unspecified cirrhosis of liver: Secondary | ICD-10-CM | POA: Diagnosis not present

## 2023-10-11 ENCOUNTER — Other Ambulatory Visit: Payer: Self-pay | Admitting: Internal Medicine

## 2023-10-11 ENCOUNTER — Other Ambulatory Visit
Admission: RE | Admit: 2023-10-11 | Discharge: 2023-10-11 | Disposition: A | Discharge status: Home / Self Care | Source: Ambulatory Visit | Attending: Emergency Medicine | Admitting: Emergency Medicine

## 2023-10-11 DIAGNOSIS — J849 Interstitial pulmonary disease, unspecified: Secondary | ICD-10-CM | POA: Diagnosis not present

## 2023-10-11 DIAGNOSIS — R0602 Shortness of breath: Secondary | ICD-10-CM | POA: Insufficient documentation

## 2023-10-11 DIAGNOSIS — R7989 Other specified abnormal findings of blood chemistry: Secondary | ICD-10-CM | POA: Diagnosis not present

## 2023-10-11 DIAGNOSIS — J0110 Acute frontal sinusitis, unspecified: Secondary | ICD-10-CM | POA: Diagnosis not present

## 2023-10-11 DIAGNOSIS — R531 Weakness: Secondary | ICD-10-CM | POA: Diagnosis not present

## 2023-10-11 DIAGNOSIS — E860 Dehydration: Secondary | ICD-10-CM | POA: Diagnosis not present

## 2023-10-11 DIAGNOSIS — R059 Cough, unspecified: Secondary | ICD-10-CM | POA: Diagnosis not present

## 2023-10-11 DIAGNOSIS — F17200 Nicotine dependence, unspecified, uncomplicated: Secondary | ICD-10-CM | POA: Insufficient documentation

## 2023-10-11 LAB — D-DIMER, QUANTITATIVE: D-Dimer, Quant: 1.87 ug{FEU}/mL — ABNORMAL HIGH (ref 0.00–0.50)

## 2023-10-12 ENCOUNTER — Ambulatory Visit
Admission: RE | Admit: 2023-10-12 | Discharge: 2023-10-12 | Disposition: A | Discharge status: Home / Self Care | Source: Ambulatory Visit | Attending: Emergency Medicine | Admitting: Emergency Medicine

## 2023-10-12 ENCOUNTER — Other Ambulatory Visit: Payer: Self-pay | Admitting: Emergency Medicine

## 2023-10-12 DIAGNOSIS — R7989 Other specified abnormal findings of blood chemistry: Secondary | ICD-10-CM | POA: Diagnosis not present

## 2023-10-12 DIAGNOSIS — J849 Interstitial pulmonary disease, unspecified: Secondary | ICD-10-CM

## 2023-10-12 DIAGNOSIS — R0602 Shortness of breath: Secondary | ICD-10-CM | POA: Diagnosis not present

## 2023-10-12 MED ORDER — IOHEXOL 350 MG/ML SOLN: 75.0000 mL | Freq: Once | INTRAVENOUS | Status: DC | PRN

## 2023-10-12 MED ORDER — IOHEXOL 350 MG/ML SOLN
75.0000 mL | Freq: Once | INTRAVENOUS | Status: AC | PRN
  Administered 2023-10-12: 75 mL via INTRAVENOUS

## 2023-10-17 DIAGNOSIS — M25552 Pain in left hip: Secondary | ICD-10-CM | POA: Diagnosis not present

## 2023-10-17 DIAGNOSIS — M47812 Spondylosis without myelopathy or radiculopathy, cervical region: Secondary | ICD-10-CM | POA: Diagnosis not present

## 2023-10-17 DIAGNOSIS — J449 Chronic obstructive pulmonary disease, unspecified: Secondary | ICD-10-CM | POA: Diagnosis not present

## 2023-10-17 DIAGNOSIS — M5136 Other intervertebral disc degeneration, lumbar region with discogenic back pain only: Secondary | ICD-10-CM | POA: Diagnosis not present

## 2023-10-17 DIAGNOSIS — R0600 Dyspnea, unspecified: Secondary | ICD-10-CM | POA: Diagnosis not present

## 2023-10-17 DIAGNOSIS — E875 Hyperkalemia: Secondary | ICD-10-CM | POA: Diagnosis not present

## 2023-10-17 DIAGNOSIS — N179 Acute kidney failure, unspecified: Secondary | ICD-10-CM | POA: Diagnosis not present

## 2023-10-17 DIAGNOSIS — D696 Thrombocytopenia, unspecified: Secondary | ICD-10-CM | POA: Diagnosis not present

## 2023-10-17 DIAGNOSIS — M542 Cervicalgia: Secondary | ICD-10-CM | POA: Diagnosis not present

## 2023-10-17 DIAGNOSIS — E119 Type 2 diabetes mellitus without complications: Secondary | ICD-10-CM | POA: Diagnosis not present

## 2023-10-17 DIAGNOSIS — C3431 Malignant neoplasm of lower lobe, right bronchus or lung: Secondary | ICD-10-CM | POA: Diagnosis not present

## 2023-10-17 DIAGNOSIS — R296 Repeated falls: Secondary | ICD-10-CM | POA: Diagnosis not present

## 2023-10-17 DIAGNOSIS — R918 Other nonspecific abnormal finding of lung field: Secondary | ICD-10-CM | POA: Diagnosis not present

## 2023-10-17 DIAGNOSIS — E1121 Type 2 diabetes mellitus with diabetic nephropathy: Secondary | ICD-10-CM | POA: Diagnosis not present

## 2023-10-17 DIAGNOSIS — E871 Hypo-osmolality and hyponatremia: Secondary | ICD-10-CM | POA: Diagnosis not present

## 2023-10-17 DIAGNOSIS — K7460 Unspecified cirrhosis of liver: Secondary | ICD-10-CM | POA: Diagnosis not present

## 2023-10-17 DIAGNOSIS — E1142 Type 2 diabetes mellitus with diabetic polyneuropathy: Secondary | ICD-10-CM | POA: Diagnosis not present

## 2023-10-17 DIAGNOSIS — R519 Headache, unspecified: Secondary | ICD-10-CM | POA: Diagnosis not present

## 2023-10-17 DIAGNOSIS — I10 Essential (primary) hypertension: Secondary | ICD-10-CM | POA: Diagnosis not present

## 2023-10-17 DIAGNOSIS — S81801A Unspecified open wound, right lower leg, initial encounter: Secondary | ICD-10-CM | POA: Diagnosis not present

## 2023-10-17 DIAGNOSIS — I2510 Atherosclerotic heart disease of native coronary artery without angina pectoris: Secondary | ICD-10-CM | POA: Diagnosis not present

## 2023-10-17 DIAGNOSIS — Z794 Long term (current) use of insulin: Secondary | ICD-10-CM | POA: Diagnosis not present

## 2023-10-17 DIAGNOSIS — S81802A Unspecified open wound, left lower leg, initial encounter: Secondary | ICD-10-CM | POA: Diagnosis not present

## 2023-10-17 DIAGNOSIS — M545 Low back pain, unspecified: Secondary | ICD-10-CM | POA: Diagnosis not present

## 2023-10-18 DIAGNOSIS — W19XXXA Unspecified fall, initial encounter: Secondary | ICD-10-CM | POA: Diagnosis not present

## 2023-10-18 DIAGNOSIS — E871 Hypo-osmolality and hyponatremia: Secondary | ICD-10-CM | POA: Diagnosis not present

## 2023-10-18 DIAGNOSIS — F17210 Nicotine dependence, cigarettes, uncomplicated: Secondary | ICD-10-CM | POA: Diagnosis not present

## 2023-10-22 DIAGNOSIS — H2513 Age-related nuclear cataract, bilateral: Secondary | ICD-10-CM | POA: Diagnosis not present

## 2023-10-22 DIAGNOSIS — H40013 Open angle with borderline findings, low risk, bilateral: Secondary | ICD-10-CM | POA: Diagnosis not present

## 2023-10-22 DIAGNOSIS — H2511 Age-related nuclear cataract, right eye: Secondary | ICD-10-CM | POA: Diagnosis not present

## 2023-10-22 DIAGNOSIS — Z794 Long term (current) use of insulin: Secondary | ICD-10-CM | POA: Diagnosis not present

## 2023-10-22 DIAGNOSIS — E119 Type 2 diabetes mellitus without complications: Secondary | ICD-10-CM | POA: Diagnosis not present

## 2023-10-25 DIAGNOSIS — Z09 Encounter for follow-up examination after completed treatment for conditions other than malignant neoplasm: Secondary | ICD-10-CM | POA: Diagnosis not present

## 2023-10-25 DIAGNOSIS — R0602 Shortness of breath: Secondary | ICD-10-CM | POA: Diagnosis not present

## 2023-10-25 DIAGNOSIS — F17210 Nicotine dependence, cigarettes, uncomplicated: Secondary | ICD-10-CM | POA: Diagnosis not present

## 2023-10-25 DIAGNOSIS — J441 Chronic obstructive pulmonary disease with (acute) exacerbation: Secondary | ICD-10-CM | POA: Diagnosis not present

## 2023-10-25 DIAGNOSIS — R531 Weakness: Secondary | ICD-10-CM | POA: Diagnosis not present

## 2023-10-25 DIAGNOSIS — E878 Other disorders of electrolyte and fluid balance, not elsewhere classified: Secondary | ICD-10-CM | POA: Diagnosis not present

## 2023-10-29 DIAGNOSIS — E871 Hypo-osmolality and hyponatremia: Secondary | ICD-10-CM | POA: Diagnosis not present

## 2023-10-30 ENCOUNTER — Other Ambulatory Visit
Admission: RE | Admit: 2023-10-30 | Discharge: 2023-10-30 | Disposition: A | Discharge status: Home / Self Care | Source: Ambulatory Visit

## 2023-10-30 DIAGNOSIS — Z794 Long term (current) use of insulin: Secondary | ICD-10-CM | POA: Diagnosis not present

## 2023-10-30 DIAGNOSIS — K7460 Unspecified cirrhosis of liver: Secondary | ICD-10-CM | POA: Insufficient documentation

## 2023-10-30 DIAGNOSIS — E1165 Type 2 diabetes mellitus with hyperglycemia: Secondary | ICD-10-CM | POA: Diagnosis not present

## 2023-10-30 DIAGNOSIS — Y92009 Unspecified place in unspecified non-institutional (private) residence as the place of occurrence of the external cause: Secondary | ICD-10-CM | POA: Diagnosis not present

## 2023-10-30 DIAGNOSIS — I8510 Secondary esophageal varices without bleeding: Secondary | ICD-10-CM | POA: Insufficient documentation

## 2023-10-30 DIAGNOSIS — W19XXXD Unspecified fall, subsequent encounter: Secondary | ICD-10-CM | POA: Diagnosis not present

## 2023-10-30 DIAGNOSIS — Z8639 Personal history of other endocrine, nutritional and metabolic disease: Secondary | ICD-10-CM | POA: Diagnosis not present

## 2023-10-30 DIAGNOSIS — K766 Portal hypertension: Secondary | ICD-10-CM | POA: Diagnosis not present

## 2023-10-30 DIAGNOSIS — R2681 Unsteadiness on feet: Secondary | ICD-10-CM | POA: Diagnosis not present

## 2023-10-30 LAB — AMMONIA: Ammonia: 40 umol/L — ABNORMAL HIGH (ref 9–35)

## 2023-10-31 ENCOUNTER — Ambulatory Visit: Admitting: Student in an Organized Health Care Education/Training Program

## 2023-10-31 ENCOUNTER — Ambulatory Visit
Admission: RE | Admit: 2023-10-31 | Discharge: 2023-10-31 | Disposition: A | Discharge status: Home / Self Care | Source: Ambulatory Visit | Attending: Student in an Organized Health Care Education/Training Program | Admitting: Student in an Organized Health Care Education/Training Program

## 2023-10-31 ENCOUNTER — Other Ambulatory Visit: Payer: Self-pay | Admitting: Student in an Organized Health Care Education/Training Program

## 2023-10-31 ENCOUNTER — Encounter: Payer: Self-pay | Admitting: Student in an Organized Health Care Education/Training Program

## 2023-10-31 VITALS — BP 104/82 | HR 75 | Temp 97.3°F | Resp 15 | Ht 66.0 in | Wt 160.0 lb

## 2023-10-31 DIAGNOSIS — M47812 Spondylosis without myelopathy or radiculopathy, cervical region: Secondary | ICD-10-CM

## 2023-10-31 DIAGNOSIS — M5481 Occipital neuralgia: Secondary | ICD-10-CM

## 2023-10-31 DIAGNOSIS — G894 Chronic pain syndrome: Secondary | ICD-10-CM

## 2023-10-31 MED ORDER — MIDAZOLAM HCL 5 MG/5ML IJ SOLN
INTRAMUSCULAR | Status: AC
  Filled 2023-10-31: qty 5

## 2023-10-31 MED ORDER — FENTANYL CITRATE (PF) 100 MCG/2ML IJ SOLN
25.0000 ug | INTRAMUSCULAR | Status: DC | PRN
  Administered 2023-10-31: 50 ug via INTRAVENOUS

## 2023-10-31 MED ORDER — LIDOCAINE HCL 2 % IJ SOLN
INTRAMUSCULAR | Status: AC
  Filled 2023-10-31: qty 20

## 2023-10-31 MED ORDER — ROPIVACAINE HCL 2 MG/ML IJ SOLN
18.0000 mL | Freq: Once | INTRAMUSCULAR | Status: AC
  Administered 2023-10-31: 18 mL via PERINEURAL

## 2023-10-31 MED ORDER — DEXAMETHASONE SOD PHOSPHATE PF 10 MG/ML IJ SOLN
10.0000 mg | Freq: Once | INTRAMUSCULAR | Status: AC
  Administered 2023-10-31: 10 mg

## 2023-10-31 MED ORDER — FENTANYL CITRATE (PF) 100 MCG/2ML IJ SOLN
INTRAMUSCULAR | Status: AC
  Filled 2023-10-31: qty 2

## 2023-10-31 MED ORDER — LACTATED RINGERS IV SOLN: Freq: Once | INTRAVENOUS | Status: AC

## 2023-10-31 MED ORDER — MIDAZOLAM HCL 5 MG/5ML IJ SOLN
0.5000 mg | Freq: Once | INTRAMUSCULAR | Status: AC
  Administered 2023-10-31: 2 mg via INTRAVENOUS

## 2023-10-31 MED ORDER — ROPIVACAINE HCL 2 MG/ML IJ SOLN
INTRAMUSCULAR | Status: AC
  Filled 2023-10-31: qty 40

## 2023-10-31 MED ORDER — DEXAMETHASONE SOD PHOSPHATE PF 10 MG/ML IJ SOLN
20.0000 mg | Freq: Once | INTRAMUSCULAR | Status: AC
  Administered 2023-10-31: 20 mg

## 2023-10-31 MED ORDER — LIDOCAINE HCL 2 % IJ SOLN
20.0000 mL | Freq: Once | INTRAMUSCULAR | Status: AC
  Administered 2023-10-31: 400 mg

## 2023-10-31 NOTE — Progress Notes (Signed)
 PROVIDER NOTE: Interpretation of information contained herein should be left to medically-trained personnel. Specific patient instructions are provided elsewhere under Patient Instructions section of medical record. This document was created in part using STT-dictation technology, any transcriptional errors that may result from this process are unintentional.  Patient: Vanessa Oneill Type: Established DOB: 02-23-1950 MRN: [id number] PCP: Fernande Ophelia JINNY DOUGLAS, MD  Service: Procedure DOS: 10/31/2023 Setting: Ambulatory Location: Ambulatory outpatient facility Delivery: Face-to-face Provider: Wallie Sherry, MD Specialty: Interventional Pain Management Specialty designation: 09 Location: Outpatient facility Ref. Prov.: Fernande Ophelia JINNY DOUGLAS, MD       Interventional Therapy   Procedure: Cervical Facet Medial Branch Block(s) #1  Laterality: Bilateral  Level: C3, C4, C6, and C7 Medial Branch Level(s). Injecting these levels blocks the C3-4, C4-5, and C6-7 cervical facet joints.  Imaging: Fluoroscopic guidance Anesthesia: Local anesthesia (1-2% Lidocaine ) Sedation: Moderate Sedation                       DOS: 10/31/2023  Performed by: Wallie Sherry, MD  Purpose: Diagnostic/Therapeutic Indications: Cervicalgia (cervical spine axial pain) severe enough to impact quality of life or function. 1. Cervical facet syndrome   2. Bilateral occipital neuralgia    NAS-11 Pain score:   Pre-procedure: 5 /10   Post-procedure: 0-No pain/10     Position / Prep / Materials:  Position: Prone. Head in cradle. C-spine slightly flexed. Prep solution: ChloraPrep (2% chlorhexidine  gluconate and 70% isopropyl alcohol) Prep Area: Posterior Cervico-thoracic Region. From occipital ridge to tip of scapula, and from shoulder to shoulder. Entire posterior and lateral neck surface. Materials:  Tray: Block Needle(s):  Type: Spinal  Gauge (G): 22  Length: 3.5-in  Qty:  4     H&P (Pre-op Assessment):  Vanessa Oneill is a  73 y.o. (year old), female patient, seen today for interventional treatment. She  has a past surgical history that includes Cholecystectomy; Abdominal hysterectomy; Esophagogastroduodenoscopy (egd) with propofol  (N/A, 07/24/2016); Appendectomy; Tonsillectomy; Esophagogastroduodenoscopy (egd) with propofol  (N/A, 11/21/2016); Esophagogastroduodenoscopy (egd) with propofol  (N/A, 02/05/2018); Colonoscopy with propofol  (N/A, 11/18/2018); Xi robotic assisted thoracoscopy- segmentectomy (Right, 03/23/2022); Intercostal nerve block (03/23/2022); Lymph node biopsy (03/23/2022); Colonoscopy with propofol  (N/A, 04/26/2022); Esophagogastroduodenoscopy (egd) with propofol  (N/A, 04/26/2022); Video bronchoscopy with endobronchial navigation (N/A, 09/07/2023); and Video bronchoscopy with endobronchial ultrasound (N/A, 09/07/2023). Vanessa Oneill has a current medication list which includes the following prescription(s): albuterol , cholecalciferol, cyclobenzaprine , gabapentin , hydrocodone -acetaminophen , insulin  nph human, ipratropium, levofloxacin , levofloxacin , melatonin, metformin , methylprednisolone , nadolol , omeprazole, ozempic (1 mg/dose), trazodone , [DISCONTINUED] fluticasone, and [DISCONTINUED] ropinirole , and the following Facility-Administered Medications: fentanyl . Her primarily concern today is the Neck Pain  Initial Vital Signs:  Pulse/HCG Rate: 75ECG Heart Rate: 76 (nsr) Temp: (!) 97.2 F (36.2 C) Resp: 18 BP: 98/71 SpO2: 98 %  BMI: Estimated body mass index is 25.82 kg/m as calculated from the following:   Height as of this encounter: 5' 6 (1.676 m).   Weight as of this encounter: 160 lb (72.6 kg).  Risk Assessment: Allergies: Reviewed. She is allergic to codeine, tylenol  with codeine #3 [acetaminophen -codeine], amoxicillin, lipitor [atorvastatin], lisinopril, and penicillins.  Allergy Precautions: None required Coagulopathies: Reviewed. None identified.  Blood-thinner therapy: None at this time Active  Infection(s): Reviewed. None identified. Vanessa Oneill is afebrile  Site Confirmation: Vanessa Oneill was asked to confirm the procedure and laterality before marking the site Procedure checklist: Completed Consent: Before the procedure and under the influence of no sedative(s), amnesic(s), or anxiolytics, the patient was informed of the treatment options, risks and  possible complications. To fulfill our ethical and legal obligations, as recommended by the American Medical Association's Code of Ethics, I have informed the patient of my clinical impression; the nature and purpose of the treatment or procedure; the risks, benefits, and possible complications of the intervention; the alternatives, including doing nothing; the risk(s) and benefit(s) of the alternative treatment(s) or procedure(s); and the risk(s) and benefit(s) of doing nothing. The patient was provided information about the general risks and possible complications associated with the procedure. These may include, but are not limited to: failure to achieve desired goals, infection, bleeding, organ or nerve damage, allergic reactions, paralysis, and death. In addition, the patient was informed of those risks and complications associated to Spine-related procedures, such as failure to decrease pain; infection (i.e.: Meningitis, epidural or intraspinal abscess); bleeding (i.e.: epidural hematoma, subarachnoid hemorrhage, or any other type of intraspinal or peri-dural bleeding); organ or nerve damage (i.e.: Any type of peripheral nerve, nerve root, or spinal cord injury) with subsequent damage to sensory, motor, and/or autonomic systems, resulting in permanent pain, numbness, and/or weakness of one or several areas of the body; allergic reactions; (i.e.: anaphylactic reaction); and/or death. Furthermore, the patient was informed of those risks and complications associated with the medications. These include, but are not limited to: allergic reactions  (i.e.: anaphylactic or anaphylactoid reaction(s)); adrenal axis suppression; blood sugar elevation that in diabetics may result in ketoacidosis or comma; water retention that in patients with history of congestive heart failure may result in shortness of breath, pulmonary edema, and decompensation with resultant heart failure; weight gain; swelling or edema; medication-induced neural toxicity; particulate matter embolism and blood vessel occlusion with resultant organ, and/or nervous system infarction; and/or aseptic necrosis of one or more joints. Finally, the patient was informed that Medicine is not an exact science; therefore, there is also the possibility of unforeseen or unpredictable risks and/or possible complications that may result in a catastrophic outcome. The patient indicated having understood very clearly. We have given the patient no guarantees and we have made no promises. Enough time was given to the patient to ask questions, all of which were answered to the patient's satisfaction. Ms. Pack has indicated that she wanted to continue with the procedure. Attestation: I, the ordering provider, attest that I have discussed with the patient the benefits, risks, side-effects, alternatives, likelihood of achieving goals, and potential problems during recovery for the procedure that I have provided informed consent. Date  Time: 10/31/2023  7:58 AM  Pre-Procedure Preparation:  Monitoring: As per clinic protocol. Respiration, ETCO2, SpO2, BP, heart rate and rhythm monitor placed and checked for adequate function Safety Precautions: Patient was assessed for positional comfort and pressure points before starting the procedure. Time-out: I initiated and conducted the Time-out before starting the procedure, as per protocol. The patient was asked to participate by confirming the accuracy of the Time Out information. Verification of the correct person, site, and procedure were performed and  confirmed by me, the nursing staff, and the patient. Time-out conducted as per Joint Commission's Universal Protocol (UP.01.01.01). Time: 0921 Start Time: 0921 hrs.  Description/Narrative of Procedure:          Laterality: See above. Targeted Levels: See above.  Rationale (medical necessity): procedure needed and proper for the diagnosis and/or treatment of the patient's medical symptoms and needs. Procedural Technique Safety Precautions: Aspiration looking for blood return was conducted prior to all injections. At no point did we inject any substances, as a needle was being advanced. No attempts  were made at seeking any paresthesias. Safe injection practices and needle disposal techniques used. Medications properly checked for expiration dates. SDV (single dose vial) medications used. Description of the Procedure: Protocol guidelines were followed. The patient was assisted into a comfortable position. The target area was identified and the area prepped in the usual manner. Skin & deeper tissues infiltrated with local anesthetic. Appropriate amount of time allowed to pass for local anesthetics to take effect. The procedure needles were then advanced to the target area. Proper needle placement secured. Negative aspiration confirmed. Solution injected in intermittent fashion, asking for systemic symptoms every 0.5cc of injectate. The needles were then removed and the area cleansed, making sure to leave some of the prepping solution back to take advantage of its long term bactericidal properties.  Technical description of process:   C3 Medial Branch Nerve Block (MBB): The target area for the C3 dorsal medial articular branch is the lateral concave waist of the articular pillar of C3. Under fluoroscopic guidance, a Quincke needle was inserted until contact was made with os over the postero-lateral aspect of the articular pillar of C3 (target area). After negative aspiration for blood, 2mL of the nerve  block solution was injected without difficulty or complication. The needle was removed intact. C4 Medial Branch Nerve Block (MBB): The target area for the C4 dorsal medial articular branch is the lateral concave waist of the articular pillar of C4. Under fluoroscopic guidance, a Quincke needle was inserted until contact was made with os over the postero-lateral aspect of the articular pillar of C4 (target area). After negative aspiration for blood, 2mL of the nerve block solution was injected without difficulty or complication. The needle was removed intact. C6 Medial Branch Nerve Block (MBB): The target area for the C6 dorsal medial articular branch is the lateral concave waist of the articular pillar of C6. Under fluoroscopic guidance, a Quincke needle was inserted until contact was made with os over the postero-lateral aspect of the articular pillar of C6 (target area). After negative aspiration for blood, 2mL of the nerve block solution was injected without difficulty or complication. The needle was removed intact. C7 Medial Branch Nerve Block (MBB): The target for the C7 dorsal medial articular branch lies on the superior-lateral tip of the C7 transverse process. Under fluoroscopic guidance, a Quincke needle was inserted until contact was made with os over the postero-lateral aspect of the articular pillar of C7 (target area). After negative aspiration for blood, 2mL of the nerve block solution was injected without difficulty or complication. The needle was removed intact.  Once the entire procedure was completed, the treated area was cleaned, making sure to leave some of the prepping solution back to take advantage of its long term bactericidal properties.  Anatomy Reference Guide:      Facet Joint Innervation  C1-2 Third occipital Nerve (TON)  C2-3 TON, C3  Medial Branch  C3-4 C3, C4                     C4-5 C4, C5                     C5-6 C5, C6                     C6-7 C6, C7                      C7-T1 C7, C8  Cervical Facet Pain Pattern overlap:   Vitals:   10/31/23 0936 10/31/23 0946 10/31/23 0956 10/31/23 1006  BP: 100/79 (!) 100/58 (!) 106/94 104/82  Pulse:      Resp: 17 13 20 15   Temp:    (!) 97.3 F (36.3 C)  TempSrc:    Temporal  SpO2: 95% 99% 99% 98%  Weight:      Height:         Start Time: 0921 hrs. End Time: 0936 hrs.  Imaging Guidance (Spinal):         Type of Imaging Technique: Fluoroscopy Guidance (Spinal) Indication(s): Fluoroscopy guidance for needle placement to enhance accuracy in procedures requiring precise needle localization for targeted delivery of medication in or near specific anatomical locations not easily accessible without such real-time imaging assistance. Exposure Time: Please see nurses notes. Contrast: None used. Fluoroscopic Guidance: I was personally present during the use of fluoroscopy. Tunnel Vision Technique used to obtain the best possible view of the target area. Parallax error corrected before commencing the procedure. Direction-depth-direction technique used to introduce the needle under continuous pulsed fluoroscopy. Once target was reached, antero-posterior, oblique, and lateral fluoroscopic projection used confirm needle placement in all planes. Images permanently stored in EMR. Interpretation: No contrast injected. I personally interpreted the imaging intraoperatively. Adequate needle placement confirmed in multiple planes. Permanent images saved into the patient's record.  Post-operative Assessment:  Post-procedure Vital Signs:  Pulse/HCG Rate: 7580 Temp: (!) 97.3 F (36.3 C) Resp: 15 BP: 104/82 SpO2: 98 %  EBL: None  Complications: No immediate post-treatment complications observed by team, or reported by patient.  Note: The patient tolerated the entire procedure well. A repeat set of vitals were taken after the procedure and the patient was kept under observation following  institutional policy, for this type of procedure. Post-procedural neurological assessment was performed, showing return to baseline, prior to discharge. The patient was provided with post-procedure discharge instructions, including a section on how to identify potential problems. Should any problems arise concerning this procedure, the patient was given instructions to immediately contact us , at any time, without hesitation. In any case, we plan to contact the patient by telephone for a follow-up status report regarding this interventional procedure.  Comments:  No additional relevant information.  Plan of Care (POC)  Orders:  No orders of the defined types were placed in this encounter.   Medications ordered for procedure: Meds ordered this encounter  Medications   lidocaine  (XYLOCAINE ) 2 % (with pres) injection 400 mg   lactated ringers  infusion   midazolam  (VERSED ) 5 MG/5ML injection 0.5-2 mg    Make sure Flumazenil is available in the pyxis when using this medication. If oversedation occurs, administer 0.2 mg IV over 15 sec. If after 45 sec no response, administer 0.2 mg again over 1 min; may repeat at 1 min intervals; not to exceed 4 doses (1 mg)   fentaNYL  (SUBLIMAZE ) injection 25-50 mcg    Make sure Narcan is available in the pyxis when using this medication. In the event of respiratory depression (RR< 8/min): Titrate NARCAN (naloxone) in increments of 0.1 to 0.2 mg IV at 2-3 minute intervals, until desired degree of reversal.   dexamethasone  (DECADRON ) injection 10 mg   ropivacaine  (PF) 2 mg/mL (0.2%) (NAROPIN ) injection 18 mL   dexamethasone  (DECADRON ) injection 20 mg   Medications administered: We administered lidocaine , lactated ringers , midazolam , fentaNYL , dexamethasone , ropivacaine  (PF) 2 mg/mL (0.2%), and dexamethasone .  See the medical record for exact dosing, route, and time of  administration.    B/L L3,4,5 MBNB 08/01/23, 08/22/2023- RFA next B/l C3,4 + C6,7 MBNB + B/L  GONB 10/31/23    Follow-up plan:   Return for Keep sch. appt.     Recent Visits Date Type Provider Dept  10/08/23 Office Visit Patel, Seema K, NP [facility]  10/03/23 Appointment Marcelino Nurse, MD [facility]  09/11/23 Office Visit Patel, Seema K, NP [facility]  08/22/23 Procedure visit Marcelino Nurse, MD [facility]  08/13/23 Procedure visit Marcelino Nurse, MD [facility]  Showing recent visits within past 90 days and meeting all other requirements Today's Visits Date Type Provider Dept  10/31/23 Procedure visit Marcelino Nurse, MD [facility]  Showing today's visits and meeting all other requirements Future Appointments Date Type Provider Dept  11/07/23 Appointment Patel, Seema K, NP [facility]  Showing future appointments within next 90 days and meeting all other requirements   Disposition: Discharge home  Discharge (Date  Time): 10/31/2023; 1010 hrs.   Primary Care Physician: Fernande Ophelia JINNY DOUGLAS, MD Location: [facility] Note by: Nurse Marcelino, MD (TTS technology used. I apologize for any typographical errors that were not detected and corrected.) Date: 10/31/2023; Time: 11:33 AM  Disclaimer:  Medicine is not an visual merchandiser. The only guarantee in medicine is that nothing is guaranteed. It is important to note that the decision to proceed with this intervention was based on the information collected from the patient. The Data and conclusions were drawn from the patient's questionnaire, the interview, and the physical examination. Because the information was provided in large part by the patient, it cannot be guaranteed that it has not been purposely or unconsciously manipulated. Every effort has been made to obtain as much relevant data as possible for this evaluation. It is important to note that the conclusions that lead to this procedure are derived in large part from the available  data. Always take into account that the treatment will also be dependent on availability of resources and existing treatment guidelines, considered by other Pain Management Practitioners as being common knowledge and practice, at the time of the intervention. For Medico-Legal purposes, it is also important to point out that variation in procedural techniques and pharmacological choices are the acceptable norm. The indications, contraindications, technique, and results of the above procedure should only be interpreted and judged by a Board-Certified Interventional Pain Specialist with extensive familiarity and expertise in the same exact procedure and technique.

## 2023-10-31 NOTE — Progress Notes (Signed)
 PROVIDER NOTE: Interpretation of information contained herein should be left to medically-trained personnel. Specific patient instructions are provided elsewhere under Patient Instructions section of medical record. This document was created in part using STT-dictation technology, any transcriptional errors that may result from this process are unintentional.  Patient: Vanessa Oneill Type: Established DOB: Mar 31, 1950 MRN: [id number] PCP: Fernande Ophelia JINNY DOUGLAS, MD  Service: Procedure DOS: 10/31/2023 Setting: Ambulatory Location: Ambulatory outpatient facility Delivery: Face-to-face Provider: Wallie Sherry, MD Specialty: Interventional Pain Management Specialty designation: 09 Location: Outpatient facility Ref. Prov.: Fernande Ophelia JINNY DOUGLAS, MD       Interventional Therapy   Primary Reason for Visit: Interventional Pain Management Treatment. CC: Neck Pain    Procedure:          Anesthesia, Analgesia, Anxiolysis:  Type: Diagnostic, Greater, Occipital Nerve Block           Region: Posterolateral Cervical Level: Occipital Ridge   Laterality: Bilateral  Anesthesia: Local (1-2% Lidocaine )  Anxiolysis: None  Sedation: Moderate  Guidance: None           Position: Prone   1. Cervical facet syndrome   2. Bilateral occipital neuralgia    NAS-11 Pain score:   Pre-procedure: 5 /10   Post-procedure: 0-No pain/10     H&P (Pre-op Assessment):  Vanessa Oneill is a 73 y.o. (year old), female patient, seen today for interventional treatment. She  has a past surgical history that includes Cholecystectomy; Abdominal hysterectomy; Esophagogastroduodenoscopy (egd) with propofol  (N/A, 07/24/2016); Appendectomy; Tonsillectomy; Esophagogastroduodenoscopy (egd) with propofol  (N/A, 11/21/2016); Esophagogastroduodenoscopy (egd) with propofol  (N/A, 02/05/2018); Colonoscopy with propofol  (N/A, 11/18/2018); Xi robotic assisted thoracoscopy- segmentectomy (Right, 03/23/2022); Intercostal nerve block (03/23/2022); Lymph  node biopsy (03/23/2022); Colonoscopy with propofol  (N/A, 04/26/2022); Esophagogastroduodenoscopy (egd) with propofol  (N/A, 04/26/2022); Video bronchoscopy with endobronchial navigation (N/A, 09/07/2023); and Video bronchoscopy with endobronchial ultrasound (N/A, 09/07/2023). Vanessa Oneill has a current medication list which includes the following prescription(s): albuterol , cholecalciferol, cyclobenzaprine , gabapentin , hydrocodone -acetaminophen , insulin  nph human, ipratropium, levofloxacin , levofloxacin , melatonin, metformin , methylprednisolone , nadolol , omeprazole, ozempic (1 mg/dose), trazodone , [DISCONTINUED] fluticasone, and [DISCONTINUED] ropinirole , and the following Facility-Administered Medications: fentanyl . Her primarily concern today is the Neck Pain  Initial Vital Signs:  Pulse/HCG Rate: 75ECG Heart Rate: 76 (nsr) Temp: (!) 97.2 F (36.2 C) Resp: 18 BP: 98/71 SpO2: 98 %  BMI: Estimated body mass index is 25.82 kg/m as calculated from the following:   Height as of this encounter: 5' 6 (1.676 m).   Weight as of this encounter: 160 lb (72.6 kg).  Risk Assessment: Allergies: Reviewed. She is allergic to codeine, tylenol  with codeine #3 [acetaminophen -codeine], amoxicillin, lipitor [atorvastatin], lisinopril, and penicillins.  Allergy Precautions: None required Coagulopathies: Reviewed. None identified.  Blood-thinner therapy: None at this time Active Infection(s): Reviewed. None identified. Vanessa Oneill is afebrile  Site Confirmation: Vanessa Oneill was asked to confirm the procedure and laterality before marking the site Procedure checklist: Completed Consent: Before the procedure and under the influence of no sedative(s), amnesic(s), or anxiolytics, the patient was informed of the treatment options, risks and possible complications. To fulfill our ethical and legal obligations, as recommended by the American Medical Association's Code of Ethics, I have informed the patient of my clinical  impression; the nature and purpose of the treatment or procedure; the risks, benefits, and possible complications of the intervention; the alternatives, including doing nothing; the risk(s) and benefit(s) of the alternative treatment(s) or procedure(s); and the risk(s) and benefit(s) of doing nothing. The patient was provided information about the general risks and possible  complications associated with the procedure. These may include, but are not limited to: failure to achieve desired goals, infection, bleeding, organ or nerve damage, allergic reactions, paralysis, and death. In addition, the patient was informed of those risks and complications associated to the procedure, such as failure to decrease pain; infection; bleeding; organ or nerve damage with subsequent damage to sensory, motor, and/or autonomic systems, resulting in permanent pain, numbness, and/or weakness of one or several areas of the body; allergic reactions; (i.e.: anaphylactic reaction); and/or death. Furthermore, the patient was informed of those risks and complications associated with the medications. These include, but are not limited to: allergic reactions (i.e.: anaphylactic or anaphylactoid reaction(s)); adrenal axis suppression; blood sugar elevation that in diabetics may result in ketoacidosis or comma; water retention that in patients with history of congestive heart failure may result in shortness of breath, pulmonary edema, and decompensation with resultant heart failure; weight gain; swelling or edema; medication-induced neural toxicity; particulate matter embolism and blood vessel occlusion with resultant organ, and/or nervous system infarction; and/or aseptic necrosis of one or more joints. Finally, the patient was informed that Medicine is not an exact science; therefore, there is also the possibility of unforeseen or unpredictable risks and/or possible complications that may result in a catastrophic outcome. The patient  indicated having understood very clearly. We have given the patient no guarantees and we have made no promises. Enough time was given to the patient to ask questions, all of which were answered to the patient's satisfaction. Ms. Gritton has indicated that she wanted to continue with the procedure. Attestation: I, the ordering provider, attest that I have discussed with the patient the benefits, risks, side-effects, alternatives, likelihood of achieving goals, and potential problems during recovery for the procedure that I have provided informed consent. Date  Time: 10/31/2023  7:58 AM  Pre-Procedure Preparation:  Monitoring: As per clinic protocol. Respiration, ETCO2, SpO2, BP, heart rate and rhythm monitor placed and checked for adequate function Safety Precautions: Patient was assessed for positional comfort and pressure points before starting the procedure. Time-out: I initiated and conducted the Time-out before starting the procedure, as per protocol. The patient was asked to participate by confirming the accuracy of the Time Out information. Verification of the correct person, site, and procedure were performed and confirmed by me, the nursing staff, and the patient. Time-out conducted as per Joint Commission's Universal Protocol (UP.01.01.01). Time: 0921 Start Time: 0921 hrs.  Description of Procedure:          Target Area: Area medial to the occipital artery at the level of the superior nuchal ridge Approach: Posterior approach Area Prepped: Entire Posterior Occipital Region ChloraPrep (2% chlorhexidine  gluconate and 70% isopropyl alcohol) Safety Precautions: Aspiration looking for blood return was conducted prior to all injections. At no point did we inject any substances, as a needle was being advanced. No attempts were made at seeking any paresthesias. Safe injection practices and needle disposal techniques used. Medications properly checked for expiration dates. SDV (single dose  vial) medications used. Description of the Procedure: Protocol guidelines were followed. The target area was identified and the area prepped in the usual manner. Skin & deeper tissues infiltrated with local anesthetic. Appropriate amount of time allowed to pass for local anesthetics to take effect. The procedure needles were then advanced to the target area. Proper needle placement secured. Negative aspiration confirmed. Solution injected in intermittent fashion, asking for systemic symptoms every 0.5cc of injectate. The needles were then removed and the area cleansed, making  sure to leave some of the prepping solution back to take advantage of its long term bactericidal properties.  10 cc solution made of 8 cc of 0.2% ropivacaine , 2 cc of Decadron  10 mg/cc. 5 cc injected for the left GON 5 cc injected for the right GON   Vitals:   10/31/23 0936 10/31/23 0946 10/31/23 0956 10/31/23 1006  BP: 100/79 (!) 100/58 (!) 106/94 104/82  Pulse:      Resp: 17 13 20 15   Temp:    (!) 97.3 F (36.3 C)  TempSrc:    Temporal  SpO2: 95% 99% 99% 98%  Weight:      Height:        Start Time: 0921 hrs. End Time: 0936 hrs. Materials:  Needle(s) Type: Spinal Needle Gauge: 22G Length: 3.5-in Medication(s): Please see orders for medications and dosing details.   Antibiotic Prophylaxis:   Anti-infectives (From admission, onward)    None      Indication(s): None identified  Post-operative Assessment:  Post-procedure Vital Signs:  Pulse/HCG Rate: 7580 Temp: (!) 97.3 F (36.3 C) Resp: 15 BP: 104/82 SpO2: 98 %  EBL: None  Complications: No immediate post-treatment complications observed by team, or reported by patient.  Note: The patient tolerated the entire procedure well. A repeat set of vitals were taken after the procedure and the patient was kept under observation following institutional policy, for this type of procedure. Post-procedural neurological assessment was performed, showing  return to baseline, prior to discharge. The patient was provided with post-procedure discharge instructions, including a section on how to identify potential problems. Should any problems arise concerning this procedure, the patient was given instructions to immediately contact us , at any time, without hesitation. In any case, we plan to contact the patient by telephone for a follow-up status report regarding this interventional procedure.  Comments:  No additional relevant information.  Plan of Care (POC)  Orders:  No orders of the defined types were placed in this encounter.    Medications ordered for procedure: Meds ordered this encounter  Medications   lidocaine  (XYLOCAINE ) 2 % (with pres) injection 400 mg   lactated ringers  infusion   midazolam  (VERSED ) 5 MG/5ML injection 0.5-2 mg    Make sure Flumazenil is available in the pyxis when using this medication. If oversedation occurs, administer 0.2 mg IV over 15 sec. If after 45 sec no response, administer 0.2 mg again over 1 min; may repeat at 1 min intervals; not to exceed 4 doses (1 mg)   fentaNYL  (SUBLIMAZE ) injection 25-50 mcg    Make sure Narcan is available in the pyxis when using this medication. In the event of respiratory depression (RR< 8/min): Titrate NARCAN (naloxone) in increments of 0.1 to 0.2 mg IV at 2-3 minute intervals, until desired degree of reversal.   dexamethasone  (DECADRON ) injection 10 mg   ropivacaine  (PF) 2 mg/mL (0.2%) (NAROPIN ) injection 18 mL   dexamethasone  (DECADRON ) injection 20 mg   Medications administered: We administered lidocaine , lactated ringers , midazolam , fentaNYL , dexamethasone , ropivacaine  (PF) 2 mg/mL (0.2%), and dexamethasone .  See the medical record for exact dosing, route, and time of administration.    B/L L3,4,5 MBNB 08/01/23, 08/22/2023 B/l C3,4 + C6,7 MBNB + B/L GONB 10/31/23    Follow-up plan:   Return for Keep sch. appt.     Recent Visits Date Type Provider Dept  10/08/23  Office Visit Patel, Seema K, NP [facility]  10/03/23 Appointment Marcelino Nurse, MD [facility]  09/11/23 Office Visit Tobie,  Seema K, NP [facility]  08/22/23 Procedure visit Marcelino Nurse, MD [facility]  08/13/23 Procedure visit Marcelino Nurse, MD [facility]  Showing recent visits within past 90 days and meeting all other requirements Today's Visits Date Type Provider Dept  10/31/23 Procedure visit Marcelino Nurse, MD [facility]  Showing today's visits and meeting all other requirements Future Appointments Date Type Provider Dept  11/07/23 Appointment Patel, Seema K, NP [facility]  Showing future appointments within next 90 days and meeting all other requirements   Disposition: Discharge home  Discharge (Date  Time): 10/31/2023; 1010 hrs.   Primary Care Physician: Fernande Ophelia JINNY DOUGLAS, MD Location: [facility] Note by: Nurse Marcelino, MD (TTS technology used. I apologize for any typographical errors that were not detected and corrected.) Date: 10/31/2023; Time: 11:34 AM  Disclaimer:  Medicine is not an visual merchandiser. The only guarantee in medicine is that nothing is guaranteed. It is important to note that the decision to proceed with this intervention was based on the information collected from the patient. The Data and conclusions were drawn from the patient's questionnaire, the interview, and the physical examination. Because the information was provided in large part by the patient, it cannot be guaranteed that it has not been purposely or unconsciously manipulated. Every effort has been made to obtain as much relevant data as possible for this evaluation. It is important to note that the conclusions that lead to this procedure are derived in large part from the available data. Always take into account that the treatment will also be dependent on availability of resources and existing  treatment guidelines, considered by other Pain Management Practitioners as being common knowledge and practice, at the time of the intervention. For Medico-Legal purposes, it is also important to point out that variation in procedural techniques and pharmacological choices are the acceptable norm. The indications, contraindications, technique, and results of the above procedure should only be interpreted and judged by a Board-Certified Interventional Pain Specialist with extensive familiarity and expertise in the same exact procedure and technique.

## 2023-10-31 NOTE — Patient Instructions (Signed)
 ______________________________________________________________________    Post-Procedure Discharge Instructions  INSTRUCTIONS Apply ice:  Purpose: This will minimize any swelling and discomfort after procedure.  When: Day of procedure, as soon as you get home. How: Fill a plastic sandwich bag with crushed ice. Cover it with a small towel and apply to injection site. How long: (15 min on, 15 min off) Apply for 15 minutes then remove x 15 minutes.  Repeat sequence on day of procedure, until you go to bed. Apply heat:  Purpose: To treat any soreness and discomfort from the procedure. When: Starting the next day after the procedure. How: Apply heat to procedure site starting the day following the procedure. How long: May continue to repeat daily, until discomfort goes away. Food intake: Start with clear liquids (like water) and advance to regular food, as tolerated.  Physical activities: Keep activities to a minimum for the first 8 hours after the procedure. After that, then as tolerated. Driving: If you have received any sedation, be responsible and do not drive. You are not allowed to drive for 24 hours after having sedation. Blood thinner: (Applies only to those taking blood thinners) You may restart your blood thinner 6 hours after your procedure. Insulin : (Applies only to Diabetic patients taking insulin ) As soon as you can eat, you may resume your normal dosing schedule. Infection prevention: Keep procedure site clean and dry. Shower daily and clean area with soap and water.  PAIN DIARY Post-procedure Pain Diary: Extremely important that this be done correctly and accurately. Recorded information will be used to determine the next step in treatment. For the purpose of accuracy, follow these rules: Evaluate only the area treated. Do not report or include pain from an untreated area. For the purpose of this evaluation, ignore all other areas of pain, except for the treated area. After your  procedure, avoid taking a long nap and attempting to complete the pain diary after you wake up. Instead, set your alarm clock to go off every hour, on the hour, for the initial 8 hours after the procedure. Document the duration of the numbing medicine, and the relief you are getting from it. Do not go to sleep and attempt to complete it later. It will not be accurate. If you received sedation, it is likely that you were given a medication that may cause amnesia. Because of this, completing the diary at a later time may cause the information to be inaccurate. This information is needed to plan your care. Follow-up appointment: Keep your post-procedure follow-up evaluation appointment after the procedure (usually 2 weeks for most procedures, 6 weeks for radiofrequencies). DO NOT FORGET to bring you pain diary with you.   EXPECT... (What should I expect to see with my procedure?) From numbing medicine (AKA: Local Anesthetics): Numbness or decrease in pain. You may also experience some weakness, which if present, could last for the duration of the local anesthetic. Onset: Full effect within 15 minutes of injected. Duration: It will depend on the type of local anesthetic used. On the average, 1 to 8 hours.  From steroids (Applies only if steroids were used): Decrease in swelling or inflammation. Once inflammation is improved, relief of the pain will follow. Onset of benefits: Depends on the amount of swelling present. The more swelling, the longer it will take for the benefits to be seen. In some cases, up to 10 days. Duration: Steroids will stay in the system x 2 weeks. Duration of benefits will depend on multiple posibilities including persistent irritating  factors. Side-effects: If present, they may typically last 2 weeks (the duration of the steroids). Frequent: Cramps (if they occur, drink Gatorade and take over-the-counter Magnesium 450-500 mg once to twice a day); water retention with temporary weight  gain; increases in blood sugar; decreased immune system response; increased appetite. Occasional: Facial flushing (red, warm cheeks); mood swings; menstrual changes. Uncommon: Long-term decrease or suppression of natural hormones; bone thinning. (These are more common with higher doses or more frequent use. This is why we prefer that our patients avoid having any injection therapies in other practices.)  Very Rare: Severe mood changes; psychosis; aseptic necrosis. From procedure: Some discomfort is to be expected once the numbing medicine wears off. This should be minimal if ice and heat are applied as instructed.  CALL IF... (When should I call?) You experience numbness and weakness that gets worse with time, as opposed to wearing off. New onset bowel or bladder incontinence. (Applies only to procedures done in the spine)  Emergency Numbers: Durning business hours (Monday - Thursday, 8:00 AM - 4:00 PM) (Friday, 9:00 AM - 12:00 Noon): [phone] After hours: [phone] NOTE: If you are having a problem and are unable connect with, or to talk to a provider, then go to your nearest urgent care or emergency department. If the problem is serious and urgent, please call 911. ______________________________________________________________________    Moderate Conscious Sedation, Adult, Care After After the procedure, it is common to have: Sleepiness for a few hours. Impaired judgment for a few hours. Trouble with balance. Nausea or vomiting if you eat too soon. Follow these instructions at home: For the time period you were told by your health care provider:  Rest. Do not participate in activities where you could fall or become injured. Do not drive or use machinery. Do not drink alcohol. Do not take sleeping pills or medicines that cause drowsiness. Do not make important decisions or sign legal documents. Do not take care of children on your own. Eating and drinking Follow  instructions from your health care provider about what you may eat and drink. Drink enough fluid to keep your urine pale yellow. If you vomit: Drink clear fluids slowly and in small amounts as you are able. Clear fluids include water, ice chips, low-calorie sports drinks, and fruit juice that has water added to it (diluted fruit juice). Eat light and bland foods in small amounts as you are able. These foods include bananas, applesauce, rice, lean meats, toast, and crackers. General instructions Take over-the-counter and prescription medicines only as told by your health care provider. Have a responsible adult stay with you for the time you are told. Do not use any products that contain nicotine  or tobacco. These products include cigarettes, chewing tobacco, and vaping devices, such as e-cigarettes. If you need help quitting, ask your health care provider. Return to your normal activities as told by your health care provider. Ask your health care provider what activities are safe for you. Your health care provider may give you more instructions. Make sure you know what you can and cannot do. Contact a health care provider if: You are still sleepy or having trouble with balance after 24 hours. You feel light-headed. You vomit every time you eat or drink. You get a rash. You have a fever. You have redness or swelling around the IV site. Get help right away if: You have trouble breathing. You start to feel confused at home. These symptoms may be an emergency. Get help right  away. Call 911. Do not wait to see if the symptoms will go away. Do not drive yourself to the hospital. This information is not intended to replace advice given to you by your health care provider. Make sure you discuss any questions you have with your health care provider. Document Revised: 07/04/2021 Document Reviewed: 07/04/2021 Elsevier Patient Education  2024 [employer].

## 2023-10-31 NOTE — Progress Notes (Signed)
 Safety precautions to be maintained throughout the outpatient stay will include: orient to surroundings, keep bed in low position, maintain call bell within reach at all times, provide assistance with transfer out of bed and ambulation.

## 2023-11-01 ENCOUNTER — Telehealth: Payer: Self-pay

## 2023-11-01 DIAGNOSIS — R269 Unspecified abnormalities of gait and mobility: Secondary | ICD-10-CM | POA: Diagnosis not present

## 2023-11-01 DIAGNOSIS — R2689 Other abnormalities of gait and mobility: Secondary | ICD-10-CM | POA: Diagnosis not present

## 2023-11-01 NOTE — Telephone Encounter (Signed)
 Post procedure follow up. Patient states she is doing fine.

## 2023-11-05 NOTE — Progress Notes (Unsigned)
 PROVIDER NOTE: Interpretation of information contained herein should be left to medically-trained personnel. Specific patient instructions are provided elsewhere under Patient Instructions section of medical record. This document was created in part using AI and STT-dictation technology, any transcriptional errors that may result from this process are unintentional.  Patient: Vanessa Oneill  Service: E/M   PCP: Fernande Ophelia JINNY DOUGLAS, MD  DOB: 09-Jan-1950  DOS: 11/06/2023  Provider: Emmy MARLA Blanch, NP  MRN: [id number]  Delivery: Face-to-face  Specialty: Interventional Pain Management  Type: Established Patient  Setting: Ambulatory outpatient facility  Specialty designation: 09  Referring Prov.: Fernande Ophelia JINNY DOUGLAS, MD  Location: Outpatient office facility       History of present illness (HPI) Ms. Vanessa Oneill, a 73 y.o. year old female, is here today because of her No primary diagnosis found.. Ms. Vanessa Oneill's primary complain today is No chief complaint on file.  Pertinent problems: Ms. Vanessa Oneill does not have any pertinent problems on file.  Pain Assessment: Severity of   is reported as a  /10. Location:    / . Onset:  . Quality:  . Timing:  . Modifying factor(s):  SABRA Vitals:  vitals were not taken for this visit.  BMI: Estimated body mass index is 25.82 kg/m as calculated from the following:   Height as of 10/31/23: 5' 6 (1.676 m).   Weight as of 10/31/23: 160 lb (72.6 kg).  Last encounter: 10/08/2023. Last procedure: Visit date not found.  Reason for encounter: both, medication management and post-procedure evaluation and assessment.   Discussed the use of AI scribe software for clinical note transcription with the patient, who gave verbal consent to proceed.  History of Present Illness          Procedure Procedure: Cervical Facet Medial Branch Block(s) #1  Laterality: Bilateral  Level: C3, C4, C6, and C7 Medial Branch Level(s). Injecting these levels blocks the C3-4, C4-5, and C6-7  cervical facet joints.  Imaging: Fluoroscopic guidance Anesthesia: Local anesthesia (1-2% Lidocaine ) Sedation: Moderate Sedation                       DOS: 10/31/2023  Performed by: Wallie Sherry, MD   Purpose: Diagnostic/Therapeutic Indications: Cervicalgia (cervical spine axial pain) severe enough to impact quality of life or function. 1. Cervical facet syndrome   2. Bilateral occipital neuralgia     NAS-11 Pain score:        Pre-procedure: 5 /10        Post-procedure: 0-No pain/10   Effectiveness:  Initial hour after procedure:   ***. Subsequent 4-6 hours post-procedure:   ***. Analgesia past initial 6 hours:   ***. Ongoing improvement:  Analgesic:  *** Function: {Blank single:19197::No benefit,No improvement,Back to baseline,Transient improvement,Ms. Vanessa Oneill reports improvement in function,Somewhat improved,Minimal improvement,   ***   } ROM: {Blank single:19197::No benefit,No improvement,Back to baseline,Transient improvement,Ms. Vanessa Oneill reports improvement in ROM,Somewhat improved,Minimal improvement,   ***   }  Pharmacotherapy Assessment   Norco 5-325 mg tablet 3 times daily as needed for severe pain. MME=15 Monitoring: [place] PMP: PDMP reviewed during this encounter.       Pharmacotherapy: No side-effects or adverse reactions reported. Compliance: No problems identified. Effectiveness: Clinically acceptable.  No notes on file  UDS:  Summary  Date Value Ref Range Status  07/03/2023 FINAL  Final    Comment:    ==================================================================== Compliance Drug Analysis, Ur ==================================================================== Specimen Alert Note: Urinary creatinine is low; ability to detect some drugs may be  compromised. Interpret results with caution. (Creatinine) ==================================================================== Test                             Result       Flag        Units  Drug Present and Declared for Prescription Verification   Hydrocodone                     913          EXPECTED   ng/mg creat   Hydromorphone                   638          EXPECTED   ng/mg creat   Norhydrocodone                 719          EXPECTED   ng/mg creat    Sources of hydrocodone  include scheduled prescription medications.    Hydromorphone  and norhydrocodone are expected metabolites of    hydrocodone . Hydromorphone  is also available as a scheduled    prescription medication.    Gabapentin                      PRESENT      EXPECTED   Acetaminophen                   PRESENT      EXPECTED  Drug Present not Declared for Prescription Verification   Doxylamine                     PRESENT      UNEXPECTED   Dextrorphan/Levorphanol        PRESENT      UNEXPECTED    Dextrorphan is an expected metabolite of dextromethorphan, an over-    the-counter or prescription cough suppressant. Levorphanol is a    scheduled prescription medication. Dextrorphan cannot be    distinguished from levorphanol by the method used for analysis.  Drug Absent but Declared for Prescription Verification   Baclofen                       Not Detected UNEXPECTED   Cyclobenzaprine                 Not Detected UNEXPECTED   Diclofenac                     Not Detected UNEXPECTED    Topical diclofenac, as indicated in the declared medication list, is    not always detected even when used as directed.  ==================================================================== Test                      Result    Flag   Units      Ref Range   Creatinine              16        LL     mg/dL      >=79 ==================================================================== Declared Medications:  The flagging and interpretation on this report are based on the  following declared medications.  Unexpected results may arise from  inaccuracies in the declared medications.   **Note: The testing scope of this panel includes these  medications:   Baclofen  Cyclobenzaprine   Gabapentin   Hydrocodone    **Note: The testing scope  of this panel does not include small to  moderate amounts of these reported medications:   Acetaminophen   Topical Diclofenac   **Note: The testing scope of this panel does not include the  following reported medications:   Albuterol  (Ventolin  HFA)  Amlodipine  Fluticasone (Flonase)  Glimepiride  (Amaryl )  Insulin  (Novolin)  Ipratropium (Atrovent )  Metformin   Nadolol   Omeprazole  Tetracaine ==================================================================== For clinical consultation, please call [phone]. ====================================================================     No results found for: CBDTHCR No results found for: D8THCCBX No results found for: D9THCCBX  ROS  Constitutional: Denies any fever or chills Gastrointestinal: No reported hemesis, hematochezia, vomiting, or acute GI distress Musculoskeletal: Denies any acute onset joint swelling, redness, loss of ROM, or weakness Neurological: No reported episodes of acute onset apraxia, aphasia, dysarthria, agnosia, amnesia, paralysis, loss of coordination, or loss of consciousness  Medication Review  HYDROcodone -acetaminophen , Melatonin, Semaglutide (1 MG/DOSE), albuterol , cholecalciferol, cyclobenzaprine , fluticasone, gabapentin , insulin  NPH Human, ipratropium, levofloxacin , metFORMIN , methylPREDNISolone , nadolol , omeprazole, rOPINIRole , and traZODone   History Review  Allergy: Ms. Rawdon is allergic to codeine, tylenol  with codeine #3 [acetaminophen -codeine], amoxicillin, lipitor [atorvastatin], lisinopril, and penicillins. Drug: Ms. Zundel  reports no history of drug use. Alcohol:  reports no history of alcohol use. Tobacco:  reports that she has been smoking cigarettes. She started smoking about 49 years ago. She has a 48 pack-year smoking history. She has never used smokeless tobacco. Social: Ms.  Atayde  reports that she has been smoking cigarettes. She started smoking about 49 years ago. She has a 48 pack-year smoking history. She has never used smokeless tobacco. She reports that she does not drink alcohol and does not use drugs. Medical:  has a past medical history of Anxiety, Aortic atherosclerosis, Arthritis, Cerebral aneurysm, Cervical radiculitis, Colon adenomas, COPD (chronic obstructive pulmonary disease) (HCC), Coronary artery disease, Depression, DM (diabetes mellitus), type 2 (HCC), Dyspnea, ESBL (extended spectrum beta-lactamase) producing bacteria infection, Esophageal varices (HCC), Frequent falls, GERD (gastroesophageal reflux disease), Headache, Hilar adenopathy, Hyperlipidemia, Hypertension, IDA (iron deficiency anemia), Insomnia, Liver disease, Lumbar myelopathy (HCC), Lung nodules, Mitral valve regurgitation, 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HCC), Personal history of tobacco use, presenting hazards to health (11/12/2014), Portal venous hypertension (HCC), Raynaud's disease, Septic shock (HCC), Sleep apnea, Squamous cell carcinoma of right lung (HCC), Thrombocytopenia, and Tobacco use. Surgical: Ms. Lizotte  has a past surgical history that includes Cholecystectomy; Abdominal hysterectomy; Esophagogastroduodenoscopy (egd) with propofol  (N/A, 07/24/2016); Appendectomy; Tonsillectomy; Esophagogastroduodenoscopy (egd) with propofol  (N/A, 11/21/2016); Esophagogastroduodenoscopy (egd) with propofol  (N/A, 02/05/2018); Colonoscopy with propofol  (N/A, 11/18/2018); Xi robotic assisted thoracoscopy- segmentectomy (Right, 03/23/2022); Intercostal nerve block (03/23/2022); Lymph node biopsy (03/23/2022); Colonoscopy with propofol  (N/A, 04/26/2022); Esophagogastroduodenoscopy (egd) with propofol  (N/A, 04/26/2022); Video bronchoscopy with endobronchial navigation (N/A, 09/07/2023); and Video bronchoscopy with endobronchial ultrasound (N/A, 09/07/2023). Family: family history includes Heart Problems in her  father; Heart failure in her mother; Lung cancer in her father.  Laboratory Chemistry Profile   Renal Lab Results  Component Value Date   BUN 9 07/26/2023   CREATININE 0.74 07/26/2023   GFRAA 56 (L) 08/12/2017   GFRNONAA >60 07/26/2023    Hepatic Lab Results  Component Value Date   AST 33 07/26/2023   ALT 17 07/26/2023   ALBUMIN  3.9 07/26/2023   ALKPHOS 119 07/26/2023   LIPASE 24 11/28/2016   AMMONIA 40 (H) 10/30/2023    Electrolytes Lab Results  Component Value Date   NA 134 (L) 07/26/2023   K 4.1 07/26/2023   CL 102 07/26/2023   CALCIUM 8.6 (L) 07/26/2023  Bone No results found for: VD25OH, [id number], [id number], [id number], 25OHVITD1, 25OHVITD2, 25OHVITD3, TESTOFREE, TESTOSTERONE  Inflammation (CRP: Acute Phase) (ESR: Chronic Phase) Lab Results  Component Value Date   CRP 16.5 (H) 10/23/2016   ESRSEDRATE 91 (H) 10/24/2016   LATICACIDVEN 1.6 08/10/2017         Note: Above Lab results reviewed.  Recent Imaging Review  DG PAIN CLINIC C-ARM 1-60 MIN NO REPORT Fluoro was used, but no Radiologist interpretation will be provided.  Please refer to NOTES tab for provider progress note. Note: Reviewed        Physical Exam  Vitals: There were no vitals taken for this visit. BMI: Estimated body mass index is 25.82 kg/m as calculated from the following:   Height as of 10/31/23: 5' 6 (1.676 m).   Weight as of 10/31/23: 160 lb (72.6 kg). Ideal: Ideal body weight: 59.3 kg (130 lb 11.7 oz) Adjusted ideal body weight: 64.6 kg (142 lb 7 oz) General appearance: Well nourished, well developed, and well hydrated. In no apparent acute distress Mental status: Alert, oriented x 3 (person, place, & time)       Respiratory: No evidence of acute respiratory distress Eyes: PERLA   Assessment   Diagnosis Status  No diagnosis found. Controlled Controlled Controlled   Updated Problems: No problems updated.  Plan of Care  Problem-specific:  Assessment  and Plan            Ms. TERISHA LOSASSO has a current medication list which includes the following long-term medication(s): gabapentin , insulin  nph human, ipratropium, omeprazole, trazodone , [DISCONTINUED] fluticasone, and [DISCONTINUED] ropinirole .  Pharmacotherapy (Medications Ordered): No orders of the defined types were placed in this encounter.  Orders:  No orders of the defined types were placed in this encounter.    Return in about 1 month (around 11/08/2023) for (F2F), (MM), Emmy Blanch NP.    Recent Visits Date Type Provider Dept  10/03/23 Appointment Marcelino Nurse, MD [facility]  09/11/23 Office Visit Lateshia Schmoker K, NP [facility]  08/22/23 Procedure visit Marcelino Nurse, MD [facility]  08/13/23 Procedure visit Marcelino Nurse, MD [facility]  08/01/23 Procedure visit Marcelino Nurse, MD [facility]  Showing recent visits within past 90 days and meeting all other requirements Today's Visits Date Type Provider Dept  10/08/23 Office Visit Deaysia Grigoryan K, NP [facility]  Showing today's visits and meeting all other requirements Future Appointments Date Type Provider Dept  10/31/23 Appointment Marcelino Nurse, MD [facility]  11/07/23 Appointment Saki Legore K, NP [facility]  Showing future appointments within next 90 days and meeting all other requirements  I discussed the assessment and treatment plan with the patient. The patient was provided an opportunity to ask questions and all were answered. The patient agreed with the plan and demonstrated an understanding of the instructions.  Patient advised to call back or seek an in-person evaluation if the symptoms or condition worsens.  Duration of encounter: 30 minutes.  Total time on encounter, as per AMA guidelines included both the face-to-face and non-face-to-face time personally spent by the physician and/or other qualified health care  professional(s) on the day of the encounter (includes time in activities that require the physician or other qualified health care professional and does not include time in activities normally performed by clinical staff). Physician's time may include the following activities when performed: Preparing to see the patient (e.g., pre-charting review of records, searching for previously ordered imaging, lab  work, and nerve conduction tests) Review of prior analgesic pharmacotherapies. Reviewing PMP Interpreting ordered tests (e.g., lab work, imaging, nerve conduction tests) Performing post-procedure evaluations, including interpretation of diagnostic procedures Obtaining and/or reviewing separately obtained history Performing a medically appropriate examination and/or evaluation Counseling and educating the patient/family/caregiver Ordering medications, tests, or procedures Referring and communicating with other health care professionals (when not separately reported) Documenting clinical information in the electronic or other health record Independently interpreting results (not separately reported) and communicating results to the patient/ family/caregiver Care coordination (not separately reported)  Note by: Janneth Krasner K Cate Oravec, NP (TTS and AI technology used. I apologize for any typographical errors that were not detected and corrected.) Date: 10/08/2023; Time: 3:57 PM    No follow-ups on file.    Recent Visits Date Type Provider Dept  10/31/23 Procedure visit Marcelino Nurse, MD [facility]  10/08/23 Office Visit Nthony Lefferts K, NP [facility]  10/03/23 Appointment Marcelino Nurse, MD [facility]  09/11/23 Office Visit Aydon Swamy K, NP [facility]  08/22/23 Procedure visit Marcelino Nurse, MD [facility]  08/13/23 Procedure visit Marcelino Nurse, MD [facility]  Showing recent visits within past 90 days and meeting all other  requirements Future Appointments Date Type Provider Dept  11/06/23 Appointment Christalyn Goertz K, NP [facility]  Showing future appointments within next 90 days and meeting all other requirements  I discussed the assessment and treatment plan with the patient. The patient was provided an opportunity to ask questions and all were answered. The patient agreed with the plan and demonstrated an understanding of the instructions.  Patient advised to call back or seek an in-person evaluation if the symptoms or condition worsens.  Duration of encounter: *** minutes.  Total time on encounter, as per AMA guidelines included both the face-to-face and non-face-to-face time personally spent by the physician and/or other qualified health care professional(s) on the day of the encounter (includes time in activities that require the physician or other qualified health care professional and does not include time in activities normally performed by clinical staff). Physician's time may include the following activities when performed: Preparing to see the patient (e.g., pre-charting review of records, searching for previously ordered imaging, lab work, and nerve conduction tests) Review of prior analgesic pharmacotherapies. Reviewing PMP Interpreting ordered tests (e.g., lab work, imaging, nerve conduction tests) Performing post-procedure evaluations, including interpretation of diagnostic procedures Obtaining and/or reviewing separately obtained history Performing a medically appropriate examination and/or evaluation Counseling and educating the patient/family/caregiver Ordering medications, tests, or procedures Referring and communicating with other health care professionals (when not separately reported) Documenting clinical information in the electronic or other health record Independently interpreting results (not separately reported) and communicating results to the patient/ family/caregiver Care  coordination (not separately reported)  Note by: Chaunte Hornbeck K Cristin Szatkowski, NP (TTS and AI technology used. I apologize for any typographical errors that were not detected and corrected.) Date: 11/06/2023; Time: 3:14 PM

## 2023-11-06 ENCOUNTER — Ambulatory Visit: Attending: Nurse Practitioner | Admitting: Nurse Practitioner

## 2023-11-06 ENCOUNTER — Encounter: Payer: Self-pay | Admitting: Nurse Practitioner

## 2023-11-06 VITALS — BP 128/86 | HR 84 | Temp 98.2°F | Resp 17 | Ht 67.0 in | Wt 160.0 lb

## 2023-11-06 DIAGNOSIS — Z79899 Other long term (current) drug therapy: Secondary | ICD-10-CM | POA: Diagnosis not present

## 2023-11-06 DIAGNOSIS — E1159 Type 2 diabetes mellitus with other circulatory complications: Secondary | ICD-10-CM | POA: Diagnosis not present

## 2023-11-06 DIAGNOSIS — M47816 Spondylosis without myelopathy or radiculopathy, lumbar region: Secondary | ICD-10-CM | POA: Insufficient documentation

## 2023-11-06 DIAGNOSIS — M5481 Occipital neuralgia: Secondary | ICD-10-CM | POA: Diagnosis not present

## 2023-11-06 DIAGNOSIS — E1165 Type 2 diabetes mellitus with hyperglycemia: Secondary | ICD-10-CM | POA: Diagnosis not present

## 2023-11-06 DIAGNOSIS — Z794 Long term (current) use of insulin: Secondary | ICD-10-CM | POA: Diagnosis not present

## 2023-11-06 DIAGNOSIS — M542 Cervicalgia: Secondary | ICD-10-CM | POA: Diagnosis not present

## 2023-11-06 DIAGNOSIS — M47812 Spondylosis without myelopathy or radiculopathy, cervical region: Secondary | ICD-10-CM | POA: Diagnosis not present

## 2023-11-06 DIAGNOSIS — G894 Chronic pain syndrome: Secondary | ICD-10-CM | POA: Diagnosis not present

## 2023-11-06 DIAGNOSIS — E1140 Type 2 diabetes mellitus with diabetic neuropathy, unspecified: Secondary | ICD-10-CM | POA: Diagnosis not present

## 2023-11-06 DIAGNOSIS — E042 Nontoxic multinodular goiter: Secondary | ICD-10-CM | POA: Diagnosis not present

## 2023-11-06 DIAGNOSIS — E1169 Type 2 diabetes mellitus with other specified complication: Secondary | ICD-10-CM | POA: Diagnosis not present

## 2023-11-06 DIAGNOSIS — E785 Hyperlipidemia, unspecified: Secondary | ICD-10-CM | POA: Diagnosis not present

## 2023-11-06 DIAGNOSIS — I152 Hypertension secondary to endocrine disorders: Secondary | ICD-10-CM | POA: Diagnosis not present

## 2023-11-06 MED ORDER — HYDROCODONE-ACETAMINOPHEN 5-325 MG PO TABS: 1.0000 | ORAL_TABLET | Freq: Three times a day (TID) | ORAL | 0 refills | Status: DC | PRN

## 2023-11-06 MED ORDER — HYDROCODONE-ACETAMINOPHEN 5-325 MG PO TABS: 1.0000 | ORAL_TABLET | Freq: Three times a day (TID) | ORAL | 0 refills | Status: AC | PRN

## 2023-11-06 NOTE — Progress Notes (Signed)
 Nursing Pain Medication Assessment:  Safety precautions to be maintained throughout the outpatient stay will include: orient to surroundings, keep bed in low position, maintain call bell within reach at all times, provide assistance with transfer out of bed and ambulation.  Medication Inspection Compliance: Ms. Nobrega did not comply with our request to bring her pills to be counted. She was reminded that bringing the medication bottles, even when empty, is a requirement.  Medication: None brought in. Pill/Patch Count: None available to be counted. Bottle Appearance: No container available. Did not bring bottle(s) to appointment. Filled Date: N/A Last Medication intake:  Today

## 2023-11-07 ENCOUNTER — Encounter: Admitting: Nurse Practitioner

## 2023-11-10 ENCOUNTER — Other Ambulatory Visit: Payer: Self-pay | Admitting: Thoracic Surgery (Cardiothoracic Vascular Surgery)

## 2023-11-26 DIAGNOSIS — M79671 Pain in right foot: Secondary | ICD-10-CM | POA: Diagnosis not present

## 2023-11-26 DIAGNOSIS — B351 Tinea unguium: Secondary | ICD-10-CM | POA: Diagnosis not present

## 2023-11-26 DIAGNOSIS — G8929 Other chronic pain: Secondary | ICD-10-CM | POA: Diagnosis not present

## 2023-11-26 DIAGNOSIS — J449 Chronic obstructive pulmonary disease, unspecified: Secondary | ICD-10-CM | POA: Diagnosis not present

## 2023-11-26 DIAGNOSIS — Z902 Acquired absence of lung [part of]: Secondary | ICD-10-CM | POA: Diagnosis not present

## 2023-11-26 DIAGNOSIS — I739 Peripheral vascular disease, unspecified: Secondary | ICD-10-CM | POA: Diagnosis not present

## 2023-11-26 DIAGNOSIS — Z122 Encounter for screening for malignant neoplasm of respiratory organs: Secondary | ICD-10-CM | POA: Diagnosis not present

## 2023-11-26 DIAGNOSIS — E1142 Type 2 diabetes mellitus with diabetic polyneuropathy: Secondary | ICD-10-CM | POA: Diagnosis not present

## 2023-11-26 DIAGNOSIS — M792 Neuralgia and neuritis, unspecified: Secondary | ICD-10-CM | POA: Diagnosis not present

## 2023-12-06 ENCOUNTER — Telehealth: Payer: Self-pay | Admitting: Oncology

## 2023-12-06 ENCOUNTER — Encounter: Payer: Self-pay | Admitting: Oncology

## 2023-12-06 NOTE — Telephone Encounter (Signed)
 Called pt to confirm appt date/time/location still work for pt - pt requested earlier time for appt - changed appt time - pt confirmed new appt time and date/location - [facility]

## 2023-12-10 ENCOUNTER — Telehealth: Payer: Self-pay | Admitting: Nurse Practitioner

## 2023-12-10 ENCOUNTER — Ambulatory Visit: Admission: RE | Admit: 2023-12-10 | Discharge: 2023-12-10 | Attending: Oncology

## 2023-12-10 ENCOUNTER — Ambulatory Visit

## 2023-12-10 DIAGNOSIS — R911 Solitary pulmonary nodule: Secondary | ICD-10-CM

## 2023-12-10 MED ORDER — IOHEXOL 300 MG/ML  SOLN
75.0000 mL | Freq: Once | INTRAMUSCULAR | Status: AC | PRN
  Administered 2023-12-10: 75 mL via INTRAVENOUS

## 2023-12-10 NOTE — Telephone Encounter (Signed)
 Vanessa Oneill is working on that.

## 2023-12-10 NOTE — Telephone Encounter (Signed)
 Patient was told her meds need PA

## 2023-12-19 ENCOUNTER — Telehealth: Payer: Self-pay | Admitting: Nurse Practitioner

## 2023-12-19 NOTE — Telephone Encounter (Signed)
 Spoke with Vanessa Oneill, patient will need to call us  when the 7 day supply is running out and Vanessa Oneill says she will make up the difference of the Rx that is being short filled.  Patient verbalizes u/o information.

## 2023-12-19 NOTE — Telephone Encounter (Signed)
 Patient is having great difficulty getting Rx filled for her hydro - apap 5-325 mg.  I have now completed 2 PA's and both say no PA is required.  Call to [facility], they report that patient has to go through the opioid naive of a 7 day supply and after that they will be able to fill 30 day supply.  I have called patient to let her know and that I will be communicating with Seema to see if we can get the other Rx's to follow the 7 day supply which will be around the 24th if she takes the medicine tid. Patient states she ususally takes 1/2 tablet and if pain does not subside she will take the other 1/2 tablet.

## 2023-12-19 NOTE — Telephone Encounter (Signed)
 Patient states her pain meds need authorization

## 2023-12-24 ENCOUNTER — Other Ambulatory Visit: Payer: Self-pay | Admitting: *Deleted

## 2023-12-24 ENCOUNTER — Inpatient Hospital Stay: Attending: Oncology | Admitting: Oncology

## 2023-12-24 ENCOUNTER — Encounter: Payer: Self-pay | Admitting: Oncology

## 2023-12-24 ENCOUNTER — Telehealth: Payer: Self-pay | Admitting: Student in an Organized Health Care Education/Training Program

## 2023-12-24 VITALS — BP 136/80 | HR 81 | Temp 98.5°F | Resp 16 | Ht 67.0 in | Wt 161.0 lb

## 2023-12-24 DIAGNOSIS — K7460 Unspecified cirrhosis of liver: Secondary | ICD-10-CM | POA: Diagnosis not present

## 2023-12-24 DIAGNOSIS — D696 Thrombocytopenia, unspecified: Secondary | ICD-10-CM

## 2023-12-24 DIAGNOSIS — F17210 Nicotine dependence, cigarettes, uncomplicated: Secondary | ICD-10-CM | POA: Diagnosis not present

## 2023-12-24 DIAGNOSIS — Z79899 Other long term (current) drug therapy: Secondary | ICD-10-CM | POA: Diagnosis not present

## 2023-12-24 DIAGNOSIS — E1159 Type 2 diabetes mellitus with other circulatory complications: Secondary | ICD-10-CM | POA: Diagnosis not present

## 2023-12-24 DIAGNOSIS — I10 Essential (primary) hypertension: Secondary | ICD-10-CM | POA: Insufficient documentation

## 2023-12-24 DIAGNOSIS — R296 Repeated falls: Secondary | ICD-10-CM | POA: Diagnosis not present

## 2023-12-24 DIAGNOSIS — R918 Other nonspecific abnormal finding of lung field: Secondary | ICD-10-CM | POA: Diagnosis not present

## 2023-12-24 DIAGNOSIS — R599 Enlarged lymph nodes, unspecified: Secondary | ICD-10-CM | POA: Insufficient documentation

## 2023-12-24 DIAGNOSIS — R911 Solitary pulmonary nodule: Secondary | ICD-10-CM | POA: Diagnosis not present

## 2023-12-24 DIAGNOSIS — J449 Chronic obstructive pulmonary disease, unspecified: Secondary | ICD-10-CM | POA: Insufficient documentation

## 2023-12-24 DIAGNOSIS — Z801 Family history of malignant neoplasm of trachea, bronchus and lung: Secondary | ICD-10-CM | POA: Insufficient documentation

## 2023-12-24 DIAGNOSIS — Z85118 Personal history of other malignant neoplasm of bronchus and lung: Secondary | ICD-10-CM | POA: Diagnosis present

## 2023-12-24 DIAGNOSIS — C3431 Malignant neoplasm of lower lobe, right bronchus or lung: Secondary | ICD-10-CM | POA: Insufficient documentation

## 2023-12-24 MED ORDER — HYDROCODONE-ACETAMINOPHEN 5-325 MG PO TABS: 1.0000 | ORAL_TABLET | Freq: Three times a day (TID) | ORAL | 0 refills | Status: DC | PRN

## 2023-12-24 NOTE — Assessment & Plan Note (Addendum)
 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 pT1b pN0 Recent PET scan showed hypermetabolic mediastinal left hilar lymph nodes and bilateral pulmonary nodules.  Concerning for disease recurrence. She underwent biopsy via bronchoscopy.  Negative for malignancy. I discussed with pulmonology.  12/14/2023 CT chest results were reviewed. Will further discuss CT with pulmonology Recommend to repeat a CT scan in 3 months for close monitoring.  Consider rebiopsy if disease progression.

## 2023-12-24 NOTE — Progress Notes (Signed)
 Hematology/Oncology Progress note [phone] [phone]     Patient Care Team: Fernande Ophelia JINNY DOUGLAS, MD as PCP - General (Internal Medicine) Theotis Lavelle BRAVO, MD as Referring Physician (Specialist) Babara Call, MD as Consulting Physician (Oncology)  REFERRING PROVIDER: Dr. 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 pT1b pN0 Recent PET scan showed hypermetabolic mediastinal left hilar lymph nodes and bilateral pulmonary nodules.  Concerning for disease recurrence. She underwent biopsy via bronchoscopy.  Negative for malignancy. I discussed with pulmonology.  12/14/2023 CT chest results were reviewed. Will further discuss CT with pulmonology Recommend to repeat a CT scan in 3 months for close monitoring.  Consider rebiopsy if disease progression.      COPD (chronic obstructive pulmonary disease) (HCC) Patient follows up with pulmonology Continue use of inhalers.  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HCC) With splenomegaly.  Small volume ascites in recent CT scan. She is overdue for liver imaging.  Recommend patient to -re- establish with gastroenterology.  Thrombocytopenia Due to splenomegaly.   Stable Observation.  Falls Check brain MRI.     Orders Placed This Encounter  Procedures   CT Chest W Contrast    Standing Status:   Future    Expected Date:   03/24/2024    Expiration Date:   12/23/2024    If indicated for the ordered procedure, I authorize the administration of contrast media per Radiology protocol:   Yes    Does the patient have a contrast media/X-ray dye allergy?:   No    Preferred imaging location?:   [facility]   MR Brain W Wo Contrast    Standing Status:   Future    Expected Date:   12/31/2023    Expiration Date:   12/23/2024    If indicated for the ordered procedure, I authorize the administration of contrast media per Radiology protocol:   Yes    What is the patient's sedation requirement?:   No Sedation    Does the patient have a pacemaker or implanted devices?:   No    Use SRS Protocol?:   No    Preferred imaging location?:   [facility] (table limit - 500lbs)   Follow up after repeat CT All questions were answered. The patient knows to call the clinic with any problems, questions or concerns.  Call Babara, MD, PhD [facility] Hematology Oncology 12/24/2023    HISTORY OF PRESENTING ILLNESS:  Vanessa Oneill 73 y.o. 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  Interval history Patient seen today as follow-up for stage I right lung squamous cell cancer on surveillance. Patient previously followed up with Dr.Agrawal. Patient switched care to me on 07/26/2023  Extensive medical record review was performed by me   I have reviewed her chart and materials related to her cancer extensively and collaborated history with the patient. 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  Other lung nodules were stable.  Follow-up in 6 months was recommended.  Repeat CT chest done on 01/25/2022 showed growing right lower lobe nodule to 11.6 mm.  Liver cirrhosis and splenomegaly.   02/13/2022 PET scan   PET scan  showed 11.6 mm right lower lobe pulmonary nodule with SUV of 2.5.  Low level hypermetabolic some associated with 2.5 cm peripheral subsolid lesion with multiple air cyst SUV of 3.2.  These may be infectious/inflammatory although low-grade neoplasm cannot be excluded.   03/23/2022 Definitive Surgery   S/p robotic assisted right lower lobe lateral basilar segmentectomy, lymph node sampling and intercostal nerve blocks levels 4 through 10 by Dr. Kerrin   04/04/2022 Cancer Staging   Staging form: Lung, AJCC 8th Edition - Pathologic: Stage IA2 (pT1b, pN0, cM0) - Signed by Kerrin Elspeth BROCKS, MD on 04/04/2022    Pathology Results   A. RIGHT LUNG, LOWER LOBE, BRONCHIAL MARGIN, EXCISION: Negative for carcinoma, margin free  B. RIGHT LUNG, LOWER LOBE, LATERAL SEGMENT, NODULE, SEGMENTECTOMY: Invasive well to moderately differentiated squamous cell carcinoma Tumor measures 1.6 x 1.3 x 1.0 cm (pT1b) Margins free  C. LYMPH NODE, LEVEL 9, EXCISION: Benign anthracotic stroma Negative for lymph node  D. LYMPH NODE, LEVEL 7, EXCISION: One benign lymph node with adjacent calcified nodule, negative for carcinoma (0/1)  E. LYMPH NODE, LEVEL 7 #2, EXCISION: One benign lymph node, negative for carcinoma (0/1)  F. LYMPH NODE, LEVEL 12, EXCISION: One benign lymph node, negative for carcinoma (0/1)  G. LYMPH NODE, LEVEL 12 #2, EXCISION: One benign lymph node, negative for carcinoma (0/1)  H. LYMPH NODE, LEVEL 13, EXCISION: One benign lymph node, negative for carcinoma (0/1)  I. LYMPH NODE, LEVEL 13 #2, EXCISION: One benign lymph node, negative for carcinoma (0/1)  J. LYMPH NODE, LEVEL 13 #3,  EXCISION: Mucus with admixed benign bronchial cells and pulmonary macrophages  K. LYMPH NODE, 10R, EXCISION: One benign lymph node, negative for carcinoma (0/1)  L. LYMPH NODE, 4R, EXCISION: One benign lymph node, negative for carcinoma (0/1)   ONCOLOGY TABLE:  LUNG: Resection  Synchronous Tumors: Not applicable Total Number of Primary Tumors: 1 Procedure: Segmentectomy with lymph node sampling Specimen Laterality: Right Tumor Focality: Unifocal Tumor Site: Lower lobe, lateral segment Tumor Size: 1.6 x 1.3 x 1.0 cm Histologic Type: Squamous cell carcinoma Visceral Pleura Invasion: Not identified Direct Invasion of Adjacent Structures: No adjacent structures present Lymphovascular Invasion: Not identified Margins: All margins negative for invasive carcinoma      Closest Margin(s) to Invasive Carcinoma: 0.8 cm from stapled parenchymal margin Treatment Effect: No known presurgical therapy Regional Lymph Nodes:      Number of Lymph Nodes Involved: 0      Number of Lymph Nodes Examined: 9                      Nodal Sites Examined: Stations 4R, 10R, 7, 12R and 13R    07/10/2023 Imaging   CT chest with contrast showed  1. Status post right lower lobe wedge resection. 2. Progressive subpleural nodularity in the bilateral upper  lobes, measuring 11-12 mm. Follow-up CT chest is suggested in 3-6 months. 3. Stable thick-walled lung cyst in the lateral left lower lobe, grossly unchanged.     08/06/2023 Imaging   PET scan showed   1. Hypermetabolic mediastinal/left hilar lymph nodes and bilateral pulmonary nodules, compatible with disease recurrence. 2. Cirrhosis. 3. Small ascites. 4. Splenomegaly. 5. Aortic atherosclerosis (ICD10-I70.0). Coronary artery calcification. 6.  Emphysema (ICD10-J43.9)   09/07/2023 Procedure   Status post biopsy via bronchoscopy.  Lung, biopsy, RUL PREDOMINANT BLOOD CLOTS WITH MINUTES FIBROVASCULAR TISSUE. NO SUFFICIENT PULMONARY PARENCHYMA PRESENT  FOR EVALUATION  1. Fine Needle Aspiration, RUL NON DIAGNOSTIC 2. Bronchial Brushing, RUL FEW GROUPS OF BENIGN APPEARING BRONCHIAL CELLS 3. Bronchial Lavage, RUL NONDIAGNOSTIC 4. Lymph Node, 4R-Biopsy BENIGN LYMPH NODE   12/14/2023 Imaging   CT chest w contrast showed   1. Staple line in the right lower lobe again noted although confluent soft tissue attenuation around the staple line is progressive since 10/12/2023. Imaging features raise concern for recurrent disease. 2. 9 mm paraspinal right upper lobe nodule appears more confluent and is cavitary on the current study. 3. 12 mm nodular opacity in the perifissural left lower lobe is new in the interval. 11 mm subpleural nodule anterior left upper lobe was measured previously at 8 mm. Close follow-up warranted as metastatic disease or metachronous primary not excluded. 4. Stable 15 mm short axis precarinal lymph node. 5. Nodular liver contour compatible with cirrhosis. Prominence of the portal venous anatomy and apparent splenomegaly suggest underlying portal venous hypertension. 6. Small volume ascites in the visualized upper abdomen is progressive since prior. 7. Aortic Atherosclerosis (ICD10-I70.0) and Emphysema (ICD10-J43.9).     Patient present for follow up . She follows up with pulmonology.  She denies any new complaints. Chronic lower extremity weakness. She reports being unsteady and has had multiple falls  MEDICAL HISTORY:  Past Medical History:  Diagnosis Date   Anxiety    Aortic atherosclerosis    Arthritis    Cerebral aneurysm    Cervical radiculitis    Colon adenomas    COPD (chronic obstructive pulmonary disease) (HCC)    Coronary artery disease    Depression    DM (diabetes mellitus), type 2 (HCC)    Dyspnea    ESBL (extended spectrum beta-lactamase) producing bacteria infection    Esophageal varices (HCC)    Frequent falls    GERD (gastroesophageal reflux disease)    Headache    Hilar  adenopathy    Hyperlipidemia    Hypertension    IDA (iron deficiency anemia)    Insomnia    Liver disease    Lumbar myelopathy (HCC)    Lung nodules    Mitral valve regurgitation    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HCC)    Personal history of tobacco use, presenting hazards to health 11/12/2014   Portal venous hypertension (HCC)    Raynaud's disease    Septic shock (HCC)    Sleep apnea    does not use cpap   Squamous cell carcinoma of right lung (HCC)    Thrombocytopenia    Tobacco use     SURGICAL HISTORY: Past Surgical History:  Procedure Laterality Date   ABDOMINAL HYSTERECTOMY     APPENDECTOMY     CHOLECYSTECTOMY     COLONOSCOPY WITH PROPOFOL  N/A 11/18/2018   Procedure: COLONOSCOPY WITH PROPOFOL ;  Surgeon: Toledo, Ladell POUR, MD;  Location: [facility] ENDOSCOPY;  Service: Gastroenterology;  Laterality: N/A;   COLONOSCOPY WITH PROPOFOL  N/A 04/26/2022   Procedure: COLONOSCOPY  WITH PROPOFOL ;  Surgeon: Toledo, Ladell POUR, MD;  Location: [facility] ENDOSCOPY;  Service: Gastroenterology;  Laterality: N/A;   ESOPHAGOGASTRODUODENOSCOPY (EGD) WITH PROPOFOL  N/A 07/24/2016   Procedure: ESOPHAGOGASTRODUODENOSCOPY (EGD) WITH PROPOFOL ;  Surgeon: Therisa Bi, MD;  Location: [facility] ENDOSCOPY;  Service: Endoscopy;  Laterality: N/A;   ESOPHAGOGASTRODUODENOSCOPY (EGD) WITH PROPOFOL  N/A 11/21/2016   Procedure: ESOPHAGOGASTRODUODENOSCOPY (EGD) WITH PROPOFOL ;  Surgeon: Toledo, Ladell POUR, MD;  Location: [facility] ENDOSCOPY;  Service: Gastroenterology;  Laterality: N/A;   ESOPHAGOGASTRODUODENOSCOPY (EGD) WITH PROPOFOL  N/A 02/05/2018   Procedure: ESOPHAGOGASTRODUODENOSCOPY (EGD) WITH PROPOFOL ;  Surgeon: Toledo, Ladell POUR, MD;  Location: [facility] ENDOSCOPY;  Service: Endoscopy;  Laterality: N/A;   ESOPHAGOGASTRODUODENOSCOPY (EGD) WITH PROPOFOL  N/A 04/26/2022   Procedure: ESOPHAGOGASTRODUODENOSCOPY (EGD) WITH PROPOFOL ;  Surgeon: Toledo, Ladell POUR, MD;  Location: [facility] ENDOSCOPY;  Service: Gastroenterology;  Laterality: N/A;   EYE  SURGERY     INTERCOSTAL NERVE BLOCK  03/23/2022   Procedure: INTERCOSTAL NERVE BLOCK;  Surgeon: Kerrin Elspeth BROCKS, MD;  Location: [facility] OR;  Service: Thoracic;;   LYMPH NODE BIOPSY  03/23/2022   Procedure: LYMPH NODE BIOPSY;  Surgeon: Kerrin Elspeth BROCKS, MD;  Location: [facility] OR;  Service: Thoracic;;   TONSILLECTOMY     VIDEO BRONCHOSCOPY WITH ENDOBRONCHIAL NAVIGATION N/A 09/07/2023   Procedure: VIDEO BRONCHOSCOPY WITH ENDOBRONCHIAL NAVIGATION;  Surgeon: Parris Manna, MD;  Location: [facility] ORS;  Service: Thoracic;  Laterality: N/A;   VIDEO BRONCHOSCOPY WITH ENDOBRONCHIAL ULTRASOUND N/A 09/07/2023   Procedure: BRONCHOSCOPY, WITH EBUS;  Surgeon: Parris Manna, MD;  Location: [facility] ORS;  Service: Thoracic;  Laterality: N/A;   XI ROBOTIC ASSISTED THORACOSCOPY- SEGMENTECTOMY Right 03/23/2022   Procedure: XI ROBOTIC ASSISTED THORACOSCOPY-RIGHT LOWER LOBE LATERAL BASILAR SEGMENTECTOMY;  Surgeon: Kerrin Elspeth BROCKS, MD;  Location: [facility] OR;  Service: Thoracic;  Laterality: Right;    SOCIAL HISTORY: Social History   Socioeconomic History   Marital status: Widowed    Spouse name: Not on file   Number of children: 2   Years of education: Not on file   Highest education level: Not on file  Occupational History   Not on file  Tobacco Use   Smoking status: Every Day    Current packs/day: 0.00    Average packs/day: 1 pack/day for 48.0 years (48.0 ttl pk-yrs)    Types: Cigarettes    Start date: 03/23/1974    Last attempt to quit: 03/23/2022    Years since quitting: 1.7   Smokeless tobacco: Never   Tobacco comments:    patient refused  Vaping Use   Vaping status: Never Used  Substance and Sexual Activity   Alcohol use: No    Alcohol/week: 0.0 standard drinks of alcohol   Drug use: No   Sexual activity: Not Currently  Other Topics Concern   Not on file  Social History Narrative   Live at home with husband   Social Drivers of Health   Tobacco Use: High Risk (12/24/2023)   Patient  History    Smoking Tobacco Use: Every Day    Smokeless Tobacco Use: Never    Passive Exposure: Not on file  Financial Resource Strain: Low Risk  (11/06/2023)   Received from [facility]   Overall Financial Resource Strain (CARDIA)    Difficulty of Paying Living Expenses: Not hard at all  Food Insecurity: No Food Insecurity (11/06/2023)   Received from [facility]   Epic    Within the past 12 months, you worried that your food would run out before you got the money  to buy more.: Never true    Within the past 12 months, the food you bought just didn't last and you didn't have money to get more.: Never true  Transportation Needs: No Transportation Needs (11/06/2023)   Received from [facility] - Transportation    In the past 12 months, has lack of transportation kept you from medical appointments or from getting medications?: No    Lack of Transportation (Non-Medical): No  Physical Activity: Not on file  Stress: Not on file  Social Connections: Not on file  Intimate Partner Violence: Not At Risk (03/24/2022)   Humiliation, Afraid, Rape, and Kick questionnaire    Fear of Current or Ex-Partner: No    Emotionally Abused: No    Physically Abused: No    Sexually Abused: No  Depression (PHQ2-9): Low Risk (11/06/2023)   Depression (PHQ2-9)    PHQ-2 Score: 0  Alcohol Screen: Not on file  Housing: Low Risk  (11/06/2023)   Received from [facility]   Epic    In the last 12 months, was there a time when you were not able to pay the mortgage or rent on time?: No    In the past 12 months, how many times have you moved where you were living?: 0    At any time in the past 12 months, were you homeless or living in a shelter (including now)?: No  Utilities: Not At Risk (11/06/2023)   Received from [facility]   Epic    In the past 12 months has the electric, gas, oil, or water company threatened to shut  off services in your home?: No  Health Literacy: Not on file    FAMILY HISTORY: Family History  Problem Relation Age of Onset   Lung cancer Father    Heart Problems Father    Heart failure Mother    Breast cancer Neg Hx     ALLERGIES:  is allergic to codeine, tylenol  with codeine #3 [acetaminophen -codeine], amoxicillin, lipitor [atorvastatin], lisinopril, and penicillins.  MEDICATIONS:  Current Outpatient Medications  Medication Sig Dispense Refill   albuterol  (VENTOLIN  HFA) 108 (90 Base) MCG/ACT inhaler Inhale 1-2 puffs into the lungs every 6 (six) hours as needed for wheezing or shortness of breath.     BREZTRI AEROSPHERE 160-9-4.8 MCG/ACT AERO inhaler Inhale 2 puffs into the lungs 2 (two) times daily.     cholecalciferol (VITAMIN D3) 25 MCG (1000 UNIT) tablet Take 1,000 Units by mouth daily.     cyclobenzaprine  (FLEXERIL ) 5 MG tablet Take 5 mg by mouth 3 (three) times daily as needed for muscle spasms.     dexamethasone  (DECADRON ) 4 MG tablet TAKE 4 TABLETS BY MOUTH WITH BREAKFAST FOR 4 DAYS, THEN DECREASE TO 3 TABLETS WITH BREAKFAST FOR 4 DAYS, THEN 2 TABLETS WITH BREAKFAST FOR 4 DAYS, THEN 1 TABLET WITH BREAKFAST FOR 4 DAYS, THEN ONE-HALF TABLET WITH BREAKFAST FOR 4 DAYS.     gabapentin  (NEURONTIN ) 300 MG capsule Take 1 capsule (300 mg total) by mouth 3 (three) times daily. Take 100 mg by mouth in the morning, 300 mg in the afternoon and 100 mg at bedtime. 270 capsule 1   [START ON 01/07/2024] HYDROcodone -acetaminophen  (NORCO/VICODIN) 5-325 MG tablet Take 1 tablet by mouth 3 (three) times daily as needed for severe pain (pain score 7-10). Must last 30 days 90 tablet 0   [START ON 12/26/2023] HYDROcodone -acetaminophen  (NORCO/VICODIN) 5-325 MG tablet Take 1 tablet by mouth 3 (three) times  daily as needed for severe pain (pain score 7-10). Must last 30 days 90 tablet 0   insulin  NPH Human (HUMULIN N,NOVOLIN N) 100 UNIT/ML injection Inject 0.1 mLs (10 Units total) into the skin 2 (two) times  daily before a meal. Use 30 units in the morning, 20 units during the day and 30 units at night. (Patient taking differently: Inject 10-20 Units into the skin 2 (two) times daily before a meal.) 10 mL 11   ipratropium (ATROVENT ) 0.06 % nasal spray Place 3 sprays into both nostrils daily.     levofloxacin  (LEVAQUIN ) 250 MG tablet Take by mouth.     levofloxacin  (LEVAQUIN ) 250 MG tablet Take 250 mg by mouth daily.     Melatonin 10 MG CHEW Chew 2 tablets by mouth at bedtime.     metFORMIN  (GLUCOPHAGE -XR) 500 MG 24 hr tablet Take 500 mg by mouth 2 (two) times daily with a meal.     methylPREDNISolone  (MEDROL  DOSEPAK) 4 MG TBPK tablet Take by mouth as directed.     nadolol  (CORGARD ) 80 MG tablet Take 80 mg by mouth at bedtime.     omeprazole (PRILOSEC) 40 MG capsule Take 40 mg by mouth every morning.     OXYGEN Inhale 2 L into the lungs as needed.     Semaglutide, 1 MG/DOSE, (OZEMPIC, 1 MG/DOSE,) 2 MG/1.5ML SOPN Inject 2 mg into the skin once a week. Sundays     traZODone  (DESYREL ) 50 MG tablet Take 50 mg by mouth at bedtime.  0   No current facility-administered medications for this visit.    REVIEW OF SYSTEMS:   Pertinent information mentioned in HPI All other systems were reviewed with the patient and are negative.  PHYSICAL EXAMINATION: ECOG PERFORMANCE STATUS: 1 - Symptomatic but completely ambulatory  Vitals:   12/24/23 1346  BP: 136/80  Pulse: 81  Resp: 16  Temp: 98.5 F (36.9 C)  SpO2: 96%   Filed Weights   12/24/23 1346  Weight: 161 lb (73 kg)    Physical Exam Constitutional:      General: She is not in acute distress.    Appearance: She is not diaphoretic.  HENT:     Head: Normocephalic.  Eyes:     General: No scleral icterus. Cardiovascular:     Rate and Rhythm: Normal rate and regular rhythm.  Pulmonary:     Effort: Pulmonary effort is normal. No respiratory distress.     Comments: Decreased breath sound bilaterally Abdominal:     General: There is no  distension.     Palpations: Abdomen is soft.     Tenderness: There is no abdominal tenderness.  Musculoskeletal:        General: Normal range of motion.     Cervical back: Normal range of motion and neck supple.  Skin:    Findings: No erythema.  Neurological:     Mental Status: She is alert and oriented to person, place, and time. Mental status is at baseline.     Motor: No abnormal muscle tone.  Psychiatric:        Mood and Affect: Mood and affect normal.      LABORATORY DATA:  I have reviewed the data as listed Lab Results  Component Value Date   WBC 4.3 08/31/2023   HGB 11.2 (L) 08/31/2023   HCT 34.0 (L) 08/31/2023   MCV 84.4 08/31/2023   PLT 67 (L) 08/31/2023   Recent Labs    07/26/23 1359  NA 134*  K 4.1  CL 102  CO2 23  GLUCOSE 188*  BUN 9  CREATININE 0.74  CALCIUM 8.6*  GFRNONAA >60  PROT 7.1  ALBUMIN  3.9  AST 33  ALT 17  ALKPHOS 119  BILITOT 1.2    RADIOGRAPHIC STUDIES: I have personally reviewed the radiological images as listed and agreed with the findings in the report. CT Chest W Contrast Result Date: 12/14/2023 CLINICAL DATA:  Pulmonary nodule. Right lower lobe lung cancer. Restaging. * Tracking Code: BO * EXAM: CT CHEST WITH CONTRAST TECHNIQUE: Multidetector CT imaging of the chest was performed during intravenous contrast administration. RADIATION DOSE REDUCTION: This exam was performed according to the departmental dose-optimization program which includes automated exposure control, adjustment of the mA and/or kV according to patient size and/or use of iterative reconstruction technique. CONTRAST:  75mL OMNIPAQUE  IOHEXOL  300 MG/ML  SOLN COMPARISON:  10/12/2023. FINDINGS: Cardiovascular: The heart size is normal. No substantial pericardial effusion. Coronary artery calcification is evident. Mild atherosclerotic calcification is noted in the wall of the thoracic aorta. Mediastinum/Nodes: 15 mm short axis precarinal lymph node is similar to prior.  Densely calcified nodal tissue identified subcarinal station and right hilum. The esophagus has normal imaging features. There is no axillary lymphadenopathy. Lungs/Pleura: Centrilobular and paraseptal emphysema evident. 9 mm paraspinal right upper lobe nodule on 33/3 appears more confluent and is cavitary on the current study. Staple line in the right lower lobe again noted although confluent soft tissue attenuation around the staple line is progressive since 10/12/2023. 12 mm nodular opacity in the perifissural left lower lobe on 70/3 is new in the interval. 11 mm subpleural nodule anterior left upper lobe on 67/3 was measured previously at 8 mm. No pleural effusion. Upper Abdomen: Nodular liver contour compatible with cirrhosis. Prominence of the portal venous anatomy and apparent splenomegaly suggest underlying portal venous hypertension. Small volume ascites in the visualized upper abdomen is progressive since prior. Musculoskeletal: No worrisome lytic or sclerotic osseous abnormality. IMPRESSION: 1. Staple line in the right lower lobe again noted although confluent soft tissue attenuation around the staple line is progressive since 10/12/2023. Imaging features raise concern for recurrent disease. 2. 9 mm paraspinal right upper lobe nodule appears more confluent and is cavitary on the current study. 3. 12 mm nodular opacity in the perifissural left lower lobe is new in the interval. 11 mm subpleural nodule anterior left upper lobe was measured previously at 8 mm. Close follow-up warranted as metastatic disease or metachronous primary not excluded. 4. Stable 15 mm short axis precarinal lymph node. 5. Nodular liver contour compatible with cirrhosis. Prominence of the portal venous anatomy and apparent splenomegaly suggest underlying portal venous hypertension. 6. Small volume ascites in the visualized upper abdomen is progressive since prior. 7. Aortic Atherosclerosis (ICD10-I70.0) and Emphysema (ICD10-J43.9).  Electronically Signed   By: Camellia Candle M.D.   On: 12/14/2023 07:50

## 2023-12-24 NOTE — Assessment & Plan Note (Addendum)
 With splenomegaly.  Small volume ascites in recent CT scan. She is overdue for liver imaging.  Recommend patient to -re- establish with gastroenterology.

## 2023-12-24 NOTE — Assessment & Plan Note (Signed)
 Due to splenomegaly.  Stable Observation.

## 2023-12-24 NOTE — Telephone Encounter (Signed)
 Pt stated that she was told to call back today for a reminder to fill her oxycodone .

## 2023-12-24 NOTE — Assessment & Plan Note (Signed)
 Patient follows up with pulmonology Continue use of inhalers.

## 2023-12-24 NOTE — Telephone Encounter (Signed)
 [facility], verified that pt picked up a 7 day supply of Hydrocodone  on 12-20-23.  Will ask Seema to send a new Rx.

## 2023-12-24 NOTE — Assessment & Plan Note (Signed)
-  Check brain MRI

## 2023-12-24 NOTE — Telephone Encounter (Signed)
 Patient informed that new Rx sent to pharmacy.

## 2023-12-25 ENCOUNTER — Encounter: Payer: Self-pay | Admitting: Oncology

## 2023-12-25 ENCOUNTER — Telehealth: Payer: Self-pay | Admitting: Oncology

## 2023-12-25 ENCOUNTER — Telehealth: Payer: Self-pay

## 2023-12-25 ENCOUNTER — Ambulatory Visit
Admission: RE | Admit: 2023-12-25 | Discharge: 2023-12-25 | Disposition: A | Discharge status: Home / Self Care | Source: Ambulatory Visit | Attending: Oncology | Admitting: Oncology

## 2023-12-25 DIAGNOSIS — K7460 Unspecified cirrhosis of liver: Secondary | ICD-10-CM

## 2023-12-25 DIAGNOSIS — R296 Repeated falls: Secondary | ICD-10-CM | POA: Diagnosis present

## 2023-12-25 MED ORDER — GADOBUTROL 1 MMOL/ML IV SOLN
7.0000 mL | Freq: Once | INTRAVENOUS | Status: AC | PRN
  Administered 2023-12-25: 7 mL via INTRAVENOUS

## 2023-12-25 NOTE — Telephone Encounter (Signed)
-----   Message from Zelphia Cap, MD sent at 12/24/2023  9:56 PM EST ----- Please refer pt to Dr. 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  Her recent CT scan showed development of ascites and she is overdue for liver images.

## 2023-12-25 NOTE — Telephone Encounter (Signed)
 Called pt to sched CT - pt confirmed date/time/location - requested appt reminder via mail - LH

## 2023-12-25 NOTE — Telephone Encounter (Signed)
 GI referral faxed to [facility]. Re- liver cirrhosis.   Fax confirmation received.

## 2023-12-26 NOTE — Result Encounter Note (Signed)
 Normal labs, no further action is necessary.

## 2024-01-01 ENCOUNTER — Telehealth: Payer: Self-pay

## 2024-01-01 NOTE — Telephone Encounter (Signed)
 Per Dr. Babara, cancel CT in March as Dr. DELENA plans to do a bronchoscpy and she will hold off CT and wait for bronch results. Pt notified of this and to expect call from Pulmonology. Pt verbalized understanding.   Please only cancel March's CT.   Keep lab and follow up in March as scheduled.

## 2024-01-02 ENCOUNTER — Other Ambulatory Visit: Payer: Self-pay | Admitting: Pulmonary Disease

## 2024-01-07 ENCOUNTER — Other Ambulatory Visit: Payer: Self-pay | Admitting: Pulmonary Disease

## 2024-01-07 DIAGNOSIS — R918 Other nonspecific abnormal finding of lung field: Secondary | ICD-10-CM

## 2024-01-07 DIAGNOSIS — R590 Localized enlarged lymph nodes: Secondary | ICD-10-CM

## 2024-01-07 DIAGNOSIS — C3490 Malignant neoplasm of unspecified part of unspecified bronchus or lung: Secondary | ICD-10-CM

## 2024-01-08 ENCOUNTER — Other Ambulatory Visit: Payer: Self-pay

## 2024-01-08 ENCOUNTER — Ambulatory Visit
Admission: RE | Admit: 2024-01-08 | Discharge: 2024-01-08 | Disposition: A | Source: Ambulatory Visit | Attending: Pulmonary Disease

## 2024-01-08 ENCOUNTER — Ambulatory Visit
Admission: RE | Admit: 2024-01-08 | Discharge: 2024-01-08 | Disposition: A | Discharge status: Home / Self Care | Source: Ambulatory Visit | Attending: Pulmonary Disease | Admitting: Pulmonary Disease

## 2024-01-08 ENCOUNTER — Encounter: Payer: Self-pay | Admitting: Pulmonary Disease

## 2024-01-08 DIAGNOSIS — I517 Cardiomegaly: Secondary | ICD-10-CM | POA: Insufficient documentation

## 2024-01-08 DIAGNOSIS — R918 Other nonspecific abnormal finding of lung field: Secondary | ICD-10-CM

## 2024-01-08 DIAGNOSIS — C3490 Malignant neoplasm of unspecified part of unspecified bronchus or lung: Secondary | ICD-10-CM | POA: Diagnosis not present

## 2024-01-08 DIAGNOSIS — R590 Localized enlarged lymph nodes: Secondary | ICD-10-CM

## 2024-01-08 DIAGNOSIS — I700 Atherosclerosis of aorta: Secondary | ICD-10-CM | POA: Insufficient documentation

## 2024-01-08 DIAGNOSIS — I2510 Atherosclerotic heart disease of native coronary artery without angina pectoris: Secondary | ICD-10-CM | POA: Diagnosis not present

## 2024-01-08 DIAGNOSIS — J439 Emphysema, unspecified: Secondary | ICD-10-CM | POA: Diagnosis not present

## 2024-01-08 DIAGNOSIS — R161 Splenomegaly, not elsewhere classified: Secondary | ICD-10-CM | POA: Insufficient documentation

## 2024-01-08 NOTE — Patient Instructions (Addendum)
 Your procedure is scheduled on:   Jan 09/2024  Report to the Registration Desk on the 1st floor of the [facility]. To find out your arrival time, please call [phone] between 1PM - 3PM on: Jan 08/2024  If your arrival time is 6:00 am, do not arrive before that time as the [facility] entrance doors do not open until 6:00 am.  REMEMBER: Instructions that are not followed completely may result in serious medical risk, up to and including death; or upon the discretion of your surgeon and anesthesiologist your surgery may need to be rescheduled.  Do not eat food or drink anything after midnight the night before surgery.  No gum chewing or hard candies.    One week prior to surgery: Stop Anti-inflammatories (NSAIDS) such as Advil , Aleve, Ibuprofen , Motrin , Naproxen, Naprosyn and Aspirin  based products such as Excedrin, Goody's Powder, BC Powder. Stop ANY OVER THE COUNTER supplements until after surgery.  You may however, continue to take Tylenol  if needed for pain up until the day of surgery.  **Follow guidelines for insulin  and diabetes medications.**  Do not take metformin  two days prior to surgery.Last dose is today, Jan 6  Do not take Ozempic 7 days prior to surgery.   Continue taking all of your other prescription medications up until the day of surgery.  ON THE DAY OF SURGERY ONLY TAKE THESE MEDICATIONS WITH SIPS OF WATER:  omeprazole (PRILOSEC)  Use inhalers Bretztri at home  on the day of surgery and bring albuterol  to the hospital.   No Alcohol for 24 hours before or after surgery.  No Smoking including e-cigarettes for 24 hours before surgery.  No chewable tobacco products for at least 6 hours before surgery.  No nicotine  patches on the day of surgery.  Do not use any recreational drugs for at least a week (preferably 2 weeks) before your surgery.  Please be advised that the combination of cocaine and anesthesia may have negative outcomes, up to and including  death. If you test positive for cocaine, your surgery will be cancelled.  On the morning of surgery brush your teeth or clean your dentures, top plates, partials with toothpaste and water, you may rinse your mouth with mouthwash if you wish. Do not swallow any toothpaste or mouthwash.  You may need to shower on day of surgery.  Do not wear jewelry, make-up, hairpins, clips or nail polish.  Do not wear lotions, powders, or perfumes.   Do not shave body hair from the neck down 48 hours before surgery.  Contact lenses, hearing aids and dentures may not be worn into surgery.  Do not bring valuables to the hospital. [facility] is not responsible for any missing/lost belongings or valuables.   Bring your C-PAP to the hospital in case you may have to spend the night.   Notify your doctor if there is any change in your medical condition (cold, fever, infection).  Wear comfortable clothing (specific to your surgery type) to the hospital.  After surgery, you can help prevent lung complications by doing breathing exercises.  Take deep breaths and cough every 1-2 hours. Your doctor may order a device called an Incentive Spirometer to help you take deep breaths. If you are being admitted to the hospital overnight, leave your suitcase in the car. After surgery it may be brought to your room.  In case of increased patient census, it may be necessary for you, the patient, to continue your postoperative care in the Same Day  Surgery department.  If you are being discharged the day of surgery, you will not be allowed to drive home. You will need a responsible individual to drive you home and stay with you for 24 hours after surgery.    Please call the Pre-admissions Testing Dept. at [phone] if you have any questions about these instructions.  Surgery Visitation Policy:  Patients having surgery or a procedure may have two visitors.  Children under the age of 60 must have an adult with them  who is not the patient.  Inpatient Visitation:    Visiting hours are 7 a.m. to 8 p.m. Up to four visitors are allowed at one time in a patient room. The visitors may rotate out with other people during the day.  One visitor age 76 or older may stay with the patient overnight and must be in the room by 8 p.m.   Merchandiser, Retail to address health-related social needs:  [email or web address]

## 2024-01-09 NOTE — Progress Notes (Addendum)
 Last Cardiology note: Clearance from 09-07-2023 Bronchoscopy  Progress Notes - documented in this encounter  Vanessa Rocher, MD - 09/06/2023 2:20 PM EDT Formatting of this note is different from the original. Images from the original note were not included.  Cardiology New Patient Visit  PRIMARY CARE PROVIDER: Fernande Ophelia Marinell DOUGLAS, MD [address] [facility] [place] [address] Heffler, Glynda 09/06/2023 DOB: May 28, 1950 Age: 74 y.o. Vanessa Oneill  Chief Complaint Patient presents with New Patient Pre-op Exam  History of Present Illness Vanessa Oneill is a 74 y.o. female here for pre-op eval prior to planned bronchoscopy, scheduled for tomorrow. Pt has a h/o tobacco use, SCC RLL s/p resection. 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 No known prior hx of CAD, MI, HF, arrhythmia. CVA. C/o fatigue, but no chest pain or pressure. No problems with daily activities. No PND or orthopnea. Is able to walk up a flight of stairs w/o having to stop. No bleeding. No syncope.  Current Medications and Allergies  Allergies Allergen Reactions Amoxicillin Other (See Comments) Other Reaction: unknonw Codeine Unknown Lipitor [Atorvastatin] Muscle Pain Lisinopril Cough Penicillins Unknown Tylenol  W Codeine [Acetaminophen -Codeine] Unknown Wellbutrin  [Bupropion  Hcl] Other (See Comments) intolerant  Current Outpatient Medications Medication Sig Dispense Refill albuterol  MDI, PROVENTIL , VENTOLIN , PROAIR , HFA 90 mcg/actuation inhaler Inhale 2 inhalations into the lungs every 6 (six) hours as needed for Wheezing 18 g 11 cyclobenzaprine  (FLEXERIL ) 5 MG tablet Take 1 tablet (5 mg total) by mouth 2 (two) times daily as needed for Muscle spasms 20 tablet 5 gabapentin  (NEURONTIN ) 100 MG capsule Take 2 capsules (200 mg total) by mouth 2 (two) times daily 360 capsule 1 gabapentin  (NEURONTIN ) 300 MG capsule Take 300 mg by mouth at  bedtime HYDROcodone -acetaminophen  (NORCO) 5-325 mg tablet Take 1 tablet by mouth every 8 (eight) hours as needed for Pain 60 tablet 0 insulin  NPH (HUMULIN N) injection (concentration 100 units/mL) Inject 10 Units subcutaneously 2 (two) times daily before meals 10 mL 12 ipratropium (ATROVENT ) 0.06 % nasal spray USE 2 SPRAY(S) IN EACH NOSTRIL TWICE DAILY AS NEEDED 15 mL 3 MELATONIN ORAL Take 20 mg by mouth at bedtime metFORMIN  (GLUCOPHAGE -XR) 500 MG XR tablet Take 1 tablet (500 mg total) by mouth every morning before breakfast AND 2 tablets (1,000 mg total) daily with dinner. 270 tablet 1 nadoloL  (CORGARD ) 80 MG tablet Take 1 tablet by mouth once daily 90 tablet 1 naproxen (NAPROSYN) 500 MG tablet 1 po bid prn 60 tablet 1 omeprazole (PRILOSEC) 40 MG DR capsule Take 1 capsule by mouth once daily 90 capsule 1 traZODone  (DESYREL ) 50 MG tablet TAKE 1 TABLET BY MOUTH AT BEDTIME 90 tablet 1 blood glucose diagnostic (ACCU-CHEK GUIDE TEST STRIPS) test strip 2 (two) times daily Use as instructed. 50 each 1 blood glucose meter kit as directed 1 each 0 cholecalciferol (VITAMIN D3) 1000 unit tablet Take 1,000 Units by mouth once daily HYDROcodone -acetaminophen  (NORCO) 5-325 mg tablet Take 1 tablet by mouth every 8 (eight) hours as needed for Pain 60 tablet 0 HYDROcodone -acetaminophen  (NORCO) 5-325 mg tablet Take 1 tablet by mouth every 8 (eight) hours as needed for Pain 60 tablet 0 insulin  syringe-needle U-100 1 mL 31 gauge x 5/16 syringe Use as directed 100 each 12 lancets Use 1 each once daily Use as instructed. 50 each 5 nicotine  (NICODERM CQ ) 21 mg/24 hr patch Place 1 patch onto the skin daily (Patient not taking: Reported on 09/06/2023) 42 patch 1  semaglutide (OZEMPIC) 2 mg/dose (8 mg/3 mL) pen injector Inject 2 mg subcutaneously once a week (Patient not taking: Reported on 09/06/2023)  No current facility-administered medications for this visit.  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 coli carrier Z22.358 10/30/2016 - Present Aortic atherosclerosis () I70.0 03/09/2016 - Present Overview Signed 03/09/2016 8:50 AM by Fernande Ophelia Marinell III, MD By CT 2/18   Coronary artery disease involving native coronary artery of native heart without angina pectoris I25.10 03/09/2016 - Present Overview Signed 03/09/2016 8:50 AM by Fernande Ophelia Marinell III, MD By CT 2/18   Headache, chronic daily R51.9 03/22/2015 - Present Long-term insulin  use (CMS/HHS-HCC) Z79.4 04/21/2014 - Present Tobacco dependence F17.200 04/21/2014 - Present Type 2 diabetes mellitus with complications (CMS/HHS-HCC) E11.8 10/06/2013 - Present Mitral valve regurgitation I34.0 Unknown - Present History of cerebral aneurysm repair Z98.890, Z86.79 Unknown - Present Overview Signed 02/02/2015 2:07 PM by Fernande Ophelia Marinell DOUGLAS, MD MRI 01/27/15 shows no residual aneurysm or acute abnormality.   Cirrhosis, non-alcoholic (CMS/HHS-HCC) K74.60 Unknown - Present Osteoarthritis M19.90 Unknown - Present Recurrent major depressive disorder, in partial remission () F33.41 Unknown - Present Thrombocytopenia () D69.6 Unknown - Present Vitamin D deficiency E55.9 03/13/2013 - Present Neuralgia M79.2 03/13/2013 -  Present Hyperlipidemia E78.5 03/13/2013 - Present HTN (hypertension), benign I10 03/13/2013 - Present COPD (chronic obstructive pulmonary disease) (CMS/HHS-HCC) J44.9 03/13/2013 - Present Lumbago M54.50 03/13/2013 - Present RESOLVED: Lung nodule seen on imaging study R91.1 11/04/2021 - 04/05/2022 Overview Addendum 02/07/2022 9:36 PM by Fernande Ophelia Marinell DOUGLAS, MD On lung cancer screening 7/23. Repeat CT 1/24 with progressive growth RLL nodule, to 1.2 cm   RESOLVED: Myalgia M79.10 04/02/2018 - 04/12/2020 RESOLVED: Adenomatous polyp of colon D12.6 03/07/2018 - 04/06/2019 RESOLVED: Chronic diarrhea K52.9 12/13/2017 - 04/06/2019 Overview Signed 12/13/2017 3:39 PM by Aundria Ladell Eck, MD Appears likely to be bile acid diarrhea status post cholecystectomy   RESOLVED: Urinary tract infection without hematuria N39.0 10/30/2016 - 10/30/2016 RESOLVED: Urinary tract infection without hematuria N39.0 10/30/2016 - 01/07/2017 Overview Signed 10/30/2016 10:22 PM by Fernande Ophelia Marinell DOUGLAS, MD Admitted 10/18 with UTI/sepsis; found to have ESBL E coli; s/p PICC line and home IV abx. Evaluated by infectious disease.   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 (CMS/HHS-HCC) I85.00 Unknown - 05/08/2022 Overview Signed 10/05/2013 2:26 PM by Fernande Ophelia Marinell III, MD grade 2   RESOLVED: Acute right hip pain M25.551 09/23/2013 - 07/28/2014 RESOLVED: Diarrhea R19.7 08/05/2013 - 10/06/2013 RESOLVED: Hand pain M79.643 08/04/2013 - 07/28/2014 RESOLVED: Type 2 diabetes mellitus  (CMS/HHS-HCC) E11.9  Unknown - 06/30/2013 RESOLVED: Hypertension I10 Unknown - 06/30/2013 RESOLVED: DM (diabetes mellitus) (CMS/HHS-HCC) E11.9 03/13/2013 - 10/06/2013 RESOLVED: Headache R51.9 03/13/2013 - 07/28/2014 RESOLVED: Esophageal reflux K21.9 03/13/2013 - 07/28/2014  ADDITIONAL MEDICAL HISTORY: Past Medical History: Diagnosis Date Anemia Aortic atherosclerosis () 03/09/2016 By CT 2/18 Cerebral aneurysm (HHS-HCC) 3 mm saccular aneurysm extending from the proximal portion of the A1 with prior clipping; previously [facility] for this and paraclinoid aneurysm. MRI/MRA of brain 2013 showed no aneurysm or stroke. MRI of brain 1/17 shows no aneurysm or stroke Chronic diarrhea 12/13/2017 Appears likely to be bile acid diarrhea status post cholecystectomy Cirrhosis, non-alcoholic (CMS/HHS-HCC) Colon polyp COPD (chronic obstructive pulmonary disease) (CMS/HHS-HCC) Coronary artery disease involving native coronary artery of native heart without angina pectoris 03/09/2016 By CT 2/18 Depression Depression with anxiety Diabetes mellitus type 2, uncontrolled 10/06/2013 ESBL E. 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 HTN (hypertension), benign 03/13/2013 Hyperlipidemia Hyperplastic colon polyp 03/23/2014 Hypertension Internal hemorrhoids 03/23/2014 Long-term insulin  use (CMS/HHS-HCC) 04/21/2014 Lumbago 03/13/2013 Mitral valve regurgitation Neuralgia 03/13/2013 Osteoarthritis Recurrent major depressive disorder, in partial remission () Restless leg Sepsis (CMS/HHS-HCC) 10/17/2016 Thrombocytopenia () Tobacco dependence 04/21/2014 Tubal pregnancy (HHS-HCC) Tubular adenoma of colon 03/23/2014 Tubulovillous adenoma of colon 03/23/2014 Type 2  diabetes mellitus (CMS/HHS-HCC) Vitamin D deficiency 03/13/2013  Social History: See HPI above. Additionally: Social History  Socioeconomic History Marital status: Widowed Spouse name: Antwan Bribiesca Number of children: 2 Occupational History Occupation: self employed Tobacco Use Smoking status: Every Day Current packs/day: 1.00 Average packs/day: 1 pack/day for 56.7 years (56.7 ttl pk-yrs) Types: Cigarettes Start date: 1969 Smokeless tobacco: Never Vaping Use Vaping status: Never Used Substance and Sexual Activity Alcohol use: No Alcohol/week: 0.0 standard drinks of alcohol Drug use: No Sexual activity: Not Currently Partners: Male Social History Narrative Feels safe in home.  Social Drivers of Catering Manager Strain: Low Risk (04/24/2023) Overall Financial Resource Strain (CARDIA) Difficulty of Paying Living Expenses: Not hard at all Food Insecurity: No Food Insecurity (04/24/2023) Hunger Vital Sign Worried About Running Out of Food in the Last Year: Never true Ran Out of Food in the Last Year: Never true Transportation Needs: No Transportation Needs (04/24/2023) PRAPARE - Contractor (Medical): No Lack of Transportation (Non-Medical): No Physical Activity: Inactive (02/06/2017) Received from [facility] Exercise Vital Sign Days of Exercise per Week: 0 days Minutes of Exercise per Session: 0 min Stress: Stress Concern Present (02/06/2017) Received from [facility] Occupational [facility] - Occupational Stress Questionnaire Feeling of Stress : To some extent Social Connections: Moderately Integrated (02/06/2017) Received from [facility] Social Connection and Isolation Panel Frequency of Communication with Friends and Family: More than three times a week Frequency of Social Gatherings with Friends and Family: Once a week Attends Religious Services: More than 4 times per year Active Member of [employer] or  Organizations: No Attends Banker Meetings: Never Marital Status: Married Housing Stability: Low Risk (05/08/2023) Housing Stability Vital Sign Unable to Pay for Housing in the Last Year: No Number of Times Moved in the Last Year: 0 Homeless in the Last Year: No  Family History: See HPI above. Additionally: Family History Problem Relation Name Age of Onset Heart failure Mother CHF Myocardial Infarction (Heart attack) Mother Lung cancer Father Myocardial Infarction (Heart attack) Father Stroke Father Cancer Sister gliobalstoma  Migraines Sister Brain cancer Sister Hypotension Sister Alcohol abuse Brother ESRD-Dialysis Maternal Aunt Diabetes Maternal Aunt Diabetes Maternal Aunt No Known Problems Maternal Grandmother Myocardial Infarction (Heart attack) Maternal Grandfather Alzheimer's disease Maternal Grandfather No Known Problems Paternal Grandmother No Known Problems Paternal Grandfather Diabetes Son Lung cancer Cousin  Review of Systems: See HPI above. Additionally: Constitutional: no fevers or chills Eyes: no diplopia ENT: no epistaxis Cardiovascular: See HPI above Respiratory: See HPI above Gastrointestinal: no BRBPR Genitourinary: no hematuria Musculoskeletal: no joint swelling Skin: no rash Neurological: no strokelike sx Psychiatric: no SI Endocrine: no night sweats Hematologic/Lymphatic: no bleeding Allergic/Immunologic: See allergies above  Physical Exam  Vitals: 09/06/23 1429 BP: 110/64 Pulse: 73  Body mass index is 26.63 kg/m.  Wt Readings from Last 3 Encounters: 09/06/23 74.8 kg (165 lb) 08/21/23 76.6 kg (168 lb 12.8 oz) 07/11/23 78.6 kg (173 lb 3.2 oz)  BP Readings from Last 3 Encounters: 09/06/23 110/64 08/21/23 132/82 07/11/23 118/74 Pulse Readings from Last 3 Encounters: 09/06/23 73 08/21/23 74 07/11/23 93   Gen: NAD Neck: No JVD CV: nl s1s2, RRR, no significant murmur Resp: CTAB, nl effort GI: abd soft, NT Skin:  warm Edema: no LE edema MSK: no obvious deformity Psych: nl affect Neuro: awake and alert Endo: no thyromegaly Lymph: no cervical LAD  Data/Results  Recent Labs 09/06/22 0708 12/15/22 0709 03/13/23 0707 CHOLTOTAL 158 165 152 HDL 37.7 41.2 36.5 LDLCALC 90 94 78 VLDL 30 30 37 TRIG 151 151 812  Recent Labs 03/13/23 0707 05/08/23 1021 05/24/23 1706 NA 131* 129* 133* K 4.8 5.2* 4.6 BUN 11 13 11  CREATININE 0.8 0.9 0.9 CO2 25.8 26.0 26.6 GLUCOSE 153* 282* 279* ALT 19 20 20  AST 31 32 33 TBILI 0.9 0.8 0.7 ALB 4.0 3.9 4.0  Recent Labs 05/08/23 1021 05/24/23 1706 07/11/23 1348 WBC 4.1 4.1 5.5 HGB 11.6* 11.3* 11.3* HCT 34.7* 34.1* 33.8* MCV 85.9 85.5 84.3 PLT 81* 79* 93*  Recent Labs 01/27/22 0709 05/02/22 0706 09/06/22 0708 12/15/22 0709 03/13/23 0707 05/24/23 1706 07/11/23 1348 TSH 1.567 -- 0.873 -- -- 1.047 -- HGBA1C 7.3* < > 7.2* 6.9* 7.9* -- 7.0* < > = values in this interval not displayed.   ECG: I personally interpreted tracing from 09/06/23 - NSR  TTE 2018 - Left ventricle: The cavity size was normal. Wall thickness was normal. Systolic function was normal. The estimated ejection fraction was in the range of 55% to 65%.  Assessment  74 y.o. female patient with:  -pre-op CV exam -coronary ca++ seen on CT -aortic atherosclerosis -tobacco use -h/o lung cancer -type II DM  Plan  -no prior hx of CAD, no si/sx of angina or HF -normal exam / ECG -good functional ability (> 4 METS) -no further testing prior to planned bronchoscopy -tobacco use: we spent 4 minutes today discussing the patients current 1 ppd cigarette dependence; the effects of smoking on longterm CV health; and a plan for quitting. pharmaco tx discussed. pt is in the precontemplative stage of behavior change -f/u one year  Attestation Statement:  I personally performed the service. (TP)  DEARL LEAVEN, MD  Dearl Leaven MD [facility] Assistant Professor of Medicine,  Division of Cardiology [facility] of Medicine

## 2024-01-09 NOTE — Telephone Encounter (Signed)
 Pt scheduled on 05/08/24

## 2024-01-09 NOTE — Progress Notes (Signed)
 Last PCP note:  Vanessa Marinell Fernande DOUGLAS, MD Physician Specialty: Internal Medicine   Progress Notes    Signed   Encounter Date: 01/01/2024 Note Received: 01/09/2024  2:32 PM   Signed       History of Present Illness Vanessa Oneill is a 74 year old female with a history of lung cancer and multiple chronic conditions who presents for an annual exam, Medicare wellness visit, and follow-up on her medical conditions.   She has a history of squamous cell carcinoma of the right lung and continues to follow with oncology. Recent imaging on December 10, 2023, showed progression of soft tissue attenuation around the staple line, raising concerns for recurrent disease. There is a 9 mm cavitary nodule in the right upper lobe, a new 12 mm nodular opacity in the left lower lobe, and an 11 mm enlarged nodule in the left upper lobe. She is hesitant about undergoing another biopsy due to previous sickness post-procedure.   Her blood sugar levels in the morning range from 80 to 104. She is currently on Ozempic once a week, which helps curb her appetite. She has reduced her insulin  use and is considering stopping metformin  to avoid hypoglycemia. Her blood sugar was 131 after eating ice cream the previous night.   She smokes about a pack of cigarettes a day and acknowledges difficulty in reducing smoking. She has chronic obstructive pulmonary disease (COPD) and interstitial lung disease.   She experiences chronic pain, particularly in her neck and shoulder, which worsened after a fall following a neck injection. She is managed by the Pain Management Service and uses pain medication. She sometimes uses a walker or cane due to balance issues related to arthritis.   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 She is scheduled to follow up with GI in May.   She is scheduled for cataract surgery in January and February for her right and left eyes, respectively. She has had  falls since her last procedure but has been more careful recently. Her brother-in-law installed ramps to help her avoid stairs.   Her mood and memory are affected, possibly due to her medications. She has been taking an over-the-counter supplement to improve mood and memory. She has been evaluated for Parkinson's disease, but no definitive diagnosis has been made. No new chest pain or palpitations. No new hearing issues. Her memory is not good, but she attributes it to her medications.   Medicare Wellness Visit     Providers [facility] Dr. Ophelia [facility] Pulmonology [facility] Neu[facility] pain [facility]     Functional Assessment (1) Hearing: Demonstrates no difficulty in hearing during normal conversation (2) Risk of Falls: Has had several falls (3) Home Safety: Patient feels secure in their home. There are operational smoke alarms in multiple areas of the home. (4) Activities of Daily Living: Independently manages personal grooming and household chores, including cooking, cleaning and laundry.  Manages Personal finances without assistance.      Depression Screening PHQ 2/9 last 3 flowsheet values       09/13/2022    8:53 AM 10/09/2023    1:24 PM 01/01/2024    3:17 PM  PHQ-2/9 Depression Screening   Little interest or pleasure in doing things 0 0 0  Feeling down, depressed, or hopeless 1 0 0  Patient Health Questionnaire-2 Score 1 * 0 0  How difficult have  these problems made it for you to do your work, take care of things at home, or get along with other people? Not difficult at all        * Data saved with a previous flowsheet row definition        Depression Severity and Treatment Recommendations:  0-4= None  5-9= Mild / Treatment: Support, educate to call if worse; return in one month  10-14= Moderate / Treatment: Support, watchful  waiting; Antidepressant or Psychotherapy  15-19= Moderately severe / Treatment: Antidepressant OR Psychotherapy  >= 20 = Major depression, severe / Antidepressant AND Psychotherapy    Cognitive impairment No significant memory issues     Prevention Plan   Item name                              Frequency        Month Due       Year Due     Health Maintenance  Topic Date Due   Medicare Subsequent AWV [phone]  09/14/2023   Annual Physical/Well Child Check  09/14/2023   Mammogram  09/02/2024 (Originally 04/06/2021)   DXA Bone Density Scan  09/02/2024 (Originally 10/07/2023)   COVID-19 Vaccine (5 - 2025-26 season) 10/08/2024 (Originally 09/03/2023)   Hemoglobin A1C  07/01/2024   Diabetes Eye Assessment Exam  07/29/2024   Hepatocellular Carcinoma Screening  08/05/2024   Diabetes Education  10/08/2024   Creatinine Level  12/31/2024   Potassium Level  12/31/2024   Monofilament Foot Exam  12/31/2024   Lipid Panel  12/31/2024   Annual Urine Albumin  Creatinine Ratio  12/31/2024   Serum Calcium  12/31/2024   Depression Screening  12/31/2024   Adult Tetanus (Td And Tdap)  09/08/2032   Medicare Initial AWV H9561  Completed   Medicare Intial Physical (IPPE) H9597  Completed   Hepatitis A Vaccines  Completed   Pneumococcal Vaccine: 50+  Completed   Shingrix  Completed   Hepatitis C Screen  Completed   RSV Immunization Pregnant or 50+  Completed   Influenza Vaccine  Completed   Hib Vaccines  Aged Out   Meningococcal B Vaccine  Aged Out   Meningococcal ACWY Vaccine  Aged Out   HPV Vaccines  Aged Out   Gastroscopy (EGD)  Discontinued   Colorectal Cancer Screening  Discontinued      Other personalized health advice Encouraged patient to exercise regularly .  Encouraged attention to diet with good intake of fruits, vegetables, and limitation of red meat to 2 times a week or less     End of Life Counseling Patient has a living will in place.  Patient is a full code.      Alcohol Screening:      Alcohol Use: Not At Risk (01/01/2024)    AUDIT-C     Frequency of Alcohol Consumption: Never     Average Number of Drinks: Patient does not drink     Frequency of Binge Drinking: Never             Current Outpatient Medications  Medication Sig Dispense Refill   albuterol  MDI, PROVENTIL , VENTOLIN , PROAIR , HFA 90 mcg/actuation inhaler Inhale 2 inhalations into the lungs every 6 (six) hours as needed for Wheezing 18 g 11   blood glucose diagnostic (ACCU-CHEK GUIDE TEST STRIPS) test strip USE TWICE DAILY AS DIRECTED 50 each 3   blood glucose meter kit as directed 1 each 0   budesonide -glycopyrrolate -formoterol (BREZTRI  AEROSPHERE) 160-9-4.8 mcg/actuation inhaler Inhale 2 inhalations into the lungs 2 (two) times daily 10.7 g 12   cholecalciferol (VITAMIN D3) 1000 unit tablet Take 1,000 Units by mouth once daily       colchicine (COLCRYS) 0.6 mg tablet Take 1 tablet (0.6 mg total) by mouth once daily 15 tablet 0   cyclobenzaprine  (FLEXERIL ) 5 MG tablet TAKE 1 TABLET BY MOUTH TWICE DAILY AS NEEDED FOR MUSCLE SPASM 20 tablet 1   gabapentin  (NEURONTIN ) 300 MG capsule Take 1 capsule (300 mg total) by mouth 3 (three) times daily for 120 days Take 300mg  morning, 300mg  afternoon, 600mg  night 90 capsule 3   HYDROcodone -acetaminophen  (NORCO) 5-325 mg tablet Take 1 tablet by mouth every 8 (eight) hours as needed for Pain 60 tablet 0   insulin  NPH (HUMULIN N) injection (concentration 100 units/mL) Inject 10 Units subcutaneously 2 (two) times daily before meals 10 mL 12   insulin  syringe-needle U-100 1 mL 31 gauge x 5/16 syringe Use as directed 100 each 12   ipratropium (ATROVENT ) 0.06 % nasal spray USE 2 SPRAY(S) IN EACH NOSTRIL TWICE DAILY AS NEEDED 15 mL 3   ipratropium-albuteroL  (DUO-NEB) nebulizer solution Take 3 mLs by nebulization 4 (four) times daily as needed for Wheezing or Shortness of Breath for up to 360 days 360 mL 2   lactulose (ENULOSE) 10 gram/15 mL oral solution Take 15 mLs by mouth once  for 1 dose 15 mL 0   lancets Use 1 each once daily Use as instructed. 50 each 5   levoFLOXacin  (LEVAQUIN ) 250 MG tablet Take 1 tablet (250 mg total) by mouth once daily for 5 days, THEN 1 tablet (250 mg total) every Monday, Wednesday, and Friday for 86 days. 41 tablet 0   lidocaine -diphenhydramine -aluminum-magnesium-simethicone (FIRST MOUTHWASH BLM) oral suspension Swish and spit 5 mLs every 6 (six) hours as needed 180 mL 5   MELATONIN ORAL Take 20 mg by mouth at bedtime       metFORMIN  (GLUCOPHAGE -XR) 500 MG XR tablet Take 1 tablet (500 mg total) by mouth every morning before breakfast AND 2 tablets (1,000 mg total) daily with dinner. 270 tablet 1   nadoloL  (CORGARD ) 80 MG tablet Take 1 tablet by mouth once daily 90 tablet 1   naproxen (NAPROSYN) 500 MG tablet 1 po bid prn 60 tablet 1   nicotine  (NICODERM CQ ) 21 mg/24 hr patch Place 1 patch onto the skin daily 42 patch 1   nystatin  (MYCOSTATIN ) 100,000 unit/mL suspension Swish and swallow 5 mLs 4 (four) times daily 200 mL 1   omeprazole (PRILOSEC) 40 MG DR capsule Take 1 capsule by mouth once daily 90 capsule 0   predniSONE  (DELTASONE ) 5 MG tablet Take 4 tablets (20 mg total) by mouth once daily for 5 days, THEN 2 tablets (10 mg total) once daily for 5 days, THEN 1 tablet (5 mg total) once daily for 80 days. 110 tablet 0   semaglutide (OZEMPIC) 2 mg/dose (8 mg/3 mL) pen injector Inject 2 mg subcutaneously once a week       TORsemide (DEMADEX) 20 MG tablet Take 1 tablet (20 mg total) by mouth once daily as needed for up to 90 days 90 tablet 0   traZODone  (DESYREL ) 50 MG tablet TAKE 1 TABLET BY MOUTH AT BEDTIME 90 tablet 0    No current facility-administered medications for this visit.           Allergies as of 01/01/2024 - Reviewed 01/01/2024  Allergen Reaction Noted   Amoxicillin  Other (See Comments)     Codeine Unknown     Lipitor [atorvastatin] Muscle Pain 03/13/2013   Lisinopril Cough 03/13/2013   Penicillins Unknown 03/13/2013    Tylenol  w codeine [acetaminophen -codeine] Unknown 03/13/2013   Wellbutrin  [bupropion  hcl] Other (See Comments) 12/18/2022          Past Medical History:  Diagnosis Date   Anemia     Aortic atherosclerosis 03/09/2016    By CT 2/18   Cerebral aneurysm (HHS-HCC)      3 mm saccular aneurysm extending from the proximal portion of the A1 with prior clipping; previously followed by [facility] for this and paraclinoid aneurysm.  MRI/MRA of brain 2013 showed no aneurysm or stroke.  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 Jeri   COLONOSCOPY   03/23/2014    Adenomatous Polyps: [facility] 03/2017   EGD   11/21/2016    Non bleeding grade II varices, portal hypertensive gastropathy  repeat 3 mo. TKT   EGD   02/05/2018    Esophageal varices/Portal Hypertensive Gastropathy/Repeat 64yr/TKT   COLONOSCOPY   11/18/2018    Hyperplastic colon polyp/PHx CP/Repeat 6yrs/TKT   Colon @ [facility]   04/26/2022    Tubular adenoma/Hyperplastic polyp/No repeat due to age/TKT   EGD @ [facility]   04/26/2022    Peptic duodenitis/Esophageal varices/Repeat 2 months/TKT   APPENDECTOMY       CHOLECYSTECTOMY OPEN       HYSTERECTOMY       TONSILLECTOMY        Social History         Tobacco Use   Smoking status: Every Day      Current  packs/day: 1.00      Average packs/day: 1 pack/day for 57.0 years (57.0 ttl pk-yrs)      Types: Cigarettes      Start date: 1969   Smokeless tobacco: Never   Tobacco comments:      Patient confirms she smokes about 3/4 ppd. 10/25/2023 -IC  Vaping Use   Vaping status: Never Used  Substance Use Topics   Alcohol use: No      Alcohol/week: 0.0 standard drinks of alcohol   Drug use: No          Family History  Problem Relation Name Age of Onset   Heart failure Mother            CHF   Myocardial Infarction (Heart attack) Mother       Lung cancer Father       Myocardial Infarction (Heart attack) Father       Stroke Father       Cancer Sister            gliobalstoma   Migraines Sister       Brain cancer Sister       Hypotension Sister       Alcohol abuse Brother       ESRD-Dialysis Maternal Aunt       Diabetes Maternal Aunt       Diabetes Maternal Aunt       No Known Problems Maternal Grandmother       Myocardial Infarction (Heart attack) Maternal Grandfather       Alzheimer's disease Maternal Grandfather       No  Known Problems Paternal Grandmother       No Known Problems Paternal Grandfather       Diabetes Son       Lung cancer Cousin          Goals Addressed                       This Visit's Progress     Diabetes Action Plan     On track      My Goal: -Eat more healthy foods. How? drink non-sugar beverages, eat 3-4 servings of fresh/frozen vegetables a day, and measure foods, especially starchy foods. -Be more physically active. How? begin walking for less than 15 minutes 3-4 times each week. -Other: Maintain A1c less than 7%                Results for orders placed or performed in visit on 01/01/24  CBC w/auto Differential (5 Part)  Result Value Ref Range    WBC (White Blood Cell Count) 6.1 4.1 - 10.2 103/uL    RBC (Red Blood Cell Count) 4.33 4.04 - 5.48 106/uL    Hemoglobin 12.8 12.0 - 15.0 gm/dL    Hematocrit 62.6 64.9 - 47.0 %    MCV (Mean Corpuscular Volume) 86.1 80.0 - 100.0 fl    MCH (Mean Corpuscular Hemoglobin) 29.6 27.0 - 31.2 pg    MCHC (Mean Corpuscular Hemoglobin Concentration) 34.3 32.0 - 36.0 gm/dL    Platelet Count 99 (L) 150 - 450 103/uL    RDW-CV (Red Cell Distribution Width) 15.2 (H) 11.6 - 14.8 %    MPV (Mean Platelet Volume) 11.6 9.4 - 12.4 fl    Neutrophils 4.26 1.50 - 7.80 103/uL    Lymphocytes 1.17 1.00 - 3.60 103/uL    Monocytes 0.50 0.00 - 1.50 103/uL    Eosinophils 0.09 0.00 - 0.55 103/uL  Basophils 0.04 0.00 - 0.09 103/uL    Neutrophil % 70.1 (H) 32.0 - 70.0 %    Lymphocyte % 19.2 10.0 - 50.0 %    Monocyte % 8.2 4.0 - 13.0 %    Eosinophil % 1.5 1.0 - 5.0 %    Basophil% 0.7 0.0 - 2.0 %    Immature Granulocyte % 0.3 <=0.7 %    Immature Granulocyte Count 0.02 <=0.06 10^3/L  Comprehensive Metabolic Panel (CMP)  Result Value Ref Range    Glucose 106 70 - 110 mg/dL    Sodium 868 (L) 863 - 145 mmol/L    Potassium 4.1 3.6 - 5.1 mmol/L    Chloride 92 (L) 97 - 109 mmol/L    Carbon Dioxide (CO2) 28.0 22.0 - 32.0 mmol/L    Urea Nitrogen (BUN)  10 7 - 25 mg/dL    Creatinine 0.7 0.6 - 1.1 mg/dL    Glomerular Filtration Rate (eGFR) 92 >60 mL/min/1.73sq m    Calcium 8.7 8.7 - 10.3 mg/dL    AST  22 8 - 39 U/L    ALT  9 5 - 38 U/L    Alk Phos (alkaline Phosphatase) 108 (H) 34 - 104 U/L    Albumin  4.5 3.5 - 4.8 g/dL    Bilirubin, Total 0.9 0.3 - 1.2 mg/dL    Protein, Total 7.0 6.1 - 7.9 g/dL    A/G Ratio 1.8 1.0 - 5.0 gm/dL  Hemoglobin J8R  Result Value Ref Range    Hemoglobin A1C 5.8 (H) 4.2 - 5.6 %    Average Blood Glucose (Calc) 120 mg/dL    Narrative    Normal Range:    4.2 - 5.6% Increased Risk:  5.7 - 6.4% Diabetes:        >= 6.5% Glycemic Control for adults with diabetes:  <7%    Lipid Panel w/calc LDL  Result Value Ref Range    Cholesterol, Total 177 100 - 200 mg/dL    Triglyceride 819 35 - 199 mg/dL    HDL (High Density Lipoprotein) Cholesterol 42.3 35.0 - 85.0 mg/dL    LDL Calculated 99 0 - 130 mg/dL    VLDL Cholesterol 36 mg/dL    Cholesterol/HDL Ratio 4.2    Microalbumin/Creatinine Ratio, Random Urine  Result Value Ref Range    Creatinine, Random Urine 19.1 (L) 37.0 - 250.0 mg/dL    Urine Albumin , Random 68   mg/L    Urine Albumin /Creatinine Ratio 356.0 (H) <30.0 ug/mg  Urinalysis w/Microscopic  Result Value Ref Range    Color Colorless Colorless, Straw, Light Yellow, Yellow, Dark Yellow    Clarity Clear Clear    Specific Gravity 1.005 1.005 - 1.030    pH, Urine 7.0 5.0 - 8.0    Protein, Urinalysis Negative Negative mg/dL    Glucose, Urinalysis Negative Negative mg/dL    Ketones, Urinalysis Negative Negative mg/dL    Blood, Urinalysis Negative Negative    Nitrite, Urinalysis Negative Negative    Leukocyte Esterase, Urinalysis Negative Negative    Bilirubin, Urinalysis Negative Negative    Urobilinogen, Urinalysis 0.2 0.2 - 1.0 mg/dL    WBC, UA 1 <=5 /hpf    Red Blood Cells, Urinalysis <1 <=3 /hpf    Bacteria, Urinalysis 0-5 0 - 5 /hpf    Squamous Epithelial Cells, Urinalysis 2 /hpf    *Note: Due to  a large number of results and/or encounters for the requested time period, some results have not been displayed. A complete set of results can be  found in Results Review.        BP 139/84   Pulse 81   Ht 170.2 cm (5' 7)   Wt 70 kg (154 lb 6.4 oz)   LMP  (LMP Unknown)   SpO2 94%   BMI 24.18 kg/m      GENERAL:  Well developed, well nourished patient, in no distress HEENT: Pupils equal and round.  Extraocular motion is intact.  Oropharynx without lesions.  TMs clear NECK:  trachea midline.  No thyromegaly. LUNGS: Reduced airflow into the bases; occasional crackles without significant wheezing or retractions CARDIAC:   Regular rate and rhythm without murmurs, rubs or gallops.  VASCULAR:  Carotid and radial pulses 2+.  Distal pulses 2+ ABDOMEN: Soft, nontender, nondistended.  No guard or rebound BREAST/GENITALIA/RECTAL: patient declines EXTREMITIES:  No clubbing, edema.   cyanosis in hands, unchanged.  No foot lesions NEUROLOGIC: Cranial nerves grossly intact.  Decreased light touch in the toes bilaterally, unchanged.  Motor strength appears to be intact and symmetrical slight     HTN (hypertension), benign  (primary encounter diagnosis) Portal venous hypertension (CMS/HHS-HCC) Squamous cell carcinoma of lower lobe of right lung (CMS/HHS-HCC) Coronary artery disease involving native coronary artery of native heart without angina pectoris History of cerebral aneurysm repair Other emphysema (CMS/HHS-HCC) Type 2 diabetes mellitus with complications (CMS/HHS-HCC) Long-term insulin  use (CMS/HHS-HCC) Recurrent major depressive disorder, in partial remission () Thrombocytopenia () Esophageal varices in cirrhosis (CMS/HHS-HCC) Cirrhosis, non-alcoholic (CMS/HHS-HCC) Long-term current use of opiate analgesic Acute midline low back pain without sciatica H/O interstitial lung disease Other hyperlipidemia Depression screening Routine general medical examination at a health care facility    Assessment & Plan Adult Wellness Visit Annual exam and Medicare wellness visit conducted. Labs show well-managed sodium, kidney function, liver enzymes, cholesterol, blood counts, and improved glycemic control. No new vascular symptoms. Blood pressure is reasonable. No new chest pain or palpitations. Hearing and dental health are stable. Memory issues likely medication-related, not dementia. Advanced directives and living will are in place. - Continue current medications and lifestyle modifications. - Follow up in six months or sooner if needed.   History of squamous cell carcinoma of right lung with possible recurrence CT scan shows soft tissue attenuation around the staple line, a 9 mm cavitary nodule in the right upper lobe, a new 12 mm nodule in the left lower lobe, and enlargement of a left upper lobe nodule to 11 mm. Concerns for recurrence or new primary cancer. Discussed options of repeat CT versus biopsy, and she will discuss these at length with the oncology team and her pulmonologist. Risks of waiting include potential rapid growth of cancer. She is hesitant about biopsy due to previous adverse experience. - Follow up with pulmonology to discuss bronchoscopy and biopsy options. - Follow up with oncology team for further imaging and management.   Cirrhosis with portal hypertension, ascites, and thrombocytopenia/esophageal varices Liver appears stable on imaging. Slight worsening of ascites noted but overall small amount. Blood counts stable with platelets between 80-100, likely due to liver condition. Avoidance of anticoagulation due to thrombocytopenia. - Continue follow up with GI as scheduled. - Avoid anticoagulation due to thrombocytopenia.  Following liver enzymes, CBC.  Monitor prothrombin time.  No recent bleeding.   Type 2 diabetes mellitus with improved glycemic control Significant improvement in glycemic control with weight loss. Blood glucose levels range from 80-102 in the  morning. Insulin  use has been reduced, and metformin  may need to be adjusted to avoid hypoglycemia. A1c improved from  7.1 to 5.8. - Continue to monitor blood glucose levels daily. - Follow up with endocrinology as scheduled. She is rarely using insulin .  Likely can wean the metformin  at this point as well.  Follow metabolic panel, urinalysis, A1c, microalbumin if not done elsewhere   Coronary artery disease, aortic atherosclerosis, and dyslipidemia No new vascular symptoms. Cholesterol levels are well-controlled with current medication regimen. Statin myopathy and cirrhosis make her a poor candidate for statins. - Continue current medication regimen. - Monitor cholesterol levels and blood pressure at home.  Follow CBC as above.  Monitor liver, lipids.  Lifestyle efforts   Hypertension Blood pressure is reasonable today. - Continue current antihypertensive regimen. - Monitor blood pressure at home.  Limit sodium.  Follow metabolic panel, urinalysis and microalbumin   Chronic obstructive pulmonary disease with ongoing tobacco dependence Continues to smoke one pack per day. Advised to reduce and eliminate smoking to improve lung health. Continues inhaled medications. - Strongly advised reduction and elimination of cigarette smoking. - Continue inhaled medications. - Follow up with pulmonology as scheduled.   Chronic pain syndrome with chronic opioid therapy Chronic neck and back pain managed by pain management service. Recent fall exacerbated neck pain. Scheduled to follow up with pain management on January 8th for potential reinjection or other interventions. - Continue follow up with pain management service. - Consider use of assistive devices to reduce fall risk.   Steroid-induced myopathy and statin myopathy Aware of the role of arthritis in weakness. Advised to avoid consistent use of steroids. - Continue to avoid consistent use of steroids.   Major depressive disorder, recurrent, in  partial remission with anxiety Stable on current regimen. Medication risks have been addressed. - Continue current medication regimen.   Osteoarthritis with fall risk Osteoarthritis contributes to fall risk. Recent falls noted. Advised to use assistive devices to prevent falls. - Use assistive devices such as a walking stick to prevent falls. - Continue to monitor for any new falls or changes in balance.  Follow-up with pain management; pain medications through that office   Follow-up in 6 months, returning sooner if needed     Vanessa JACK KLEIN III, MD   This note has been created using automated tools and reviewed for accuracy by Vanessa Oneill.   Use of artificial intelligence tool (ABRIDGE) to help with documentation was discussed with patient.         *Some images could not be shown.         Electronically signed by Vanessa Marinell Fernande DOUGLAS, MD at 01/01/2024  5:00 PM    Office Visit on 01/01/2024 Note shared with patient in the original system   Received From: [facility]

## 2024-01-10 ENCOUNTER — Ambulatory Visit: Admitting: Student in an Organized Health Care Education/Training Program

## 2024-01-11 ENCOUNTER — Ambulatory Visit
Admission: RE | Admit: 2024-01-11 | Discharge: 2024-01-11 | Disposition: A | Discharge status: Home / Self Care | Attending: Pulmonary Disease | Admitting: Pulmonary Disease

## 2024-01-11 ENCOUNTER — Ambulatory Visit

## 2024-01-11 ENCOUNTER — Encounter
Admission: RE | Disposition: A | Discharge status: Home / Self Care | Payer: Self-pay | Source: Home / Self Care | Attending: Pulmonary Disease

## 2024-01-11 ENCOUNTER — Encounter: Payer: Self-pay | Admitting: Pulmonary Disease

## 2024-01-11 ENCOUNTER — Other Ambulatory Visit: Payer: Self-pay

## 2024-01-11 DIAGNOSIS — I2510 Atherosclerotic heart disease of native coronary artery without angina pectoris: Secondary | ICD-10-CM | POA: Diagnosis not present

## 2024-01-11 DIAGNOSIS — F32A Depression, unspecified: Secondary | ICD-10-CM | POA: Insufficient documentation

## 2024-01-11 DIAGNOSIS — R590 Localized enlarged lymph nodes: Secondary | ICD-10-CM | POA: Insufficient documentation

## 2024-01-11 DIAGNOSIS — F17210 Nicotine dependence, cigarettes, uncomplicated: Secondary | ICD-10-CM | POA: Diagnosis not present

## 2024-01-11 DIAGNOSIS — Z01812 Encounter for preprocedural laboratory examination: Secondary | ICD-10-CM

## 2024-01-11 DIAGNOSIS — J449 Chronic obstructive pulmonary disease, unspecified: Secondary | ICD-10-CM | POA: Insufficient documentation

## 2024-01-11 DIAGNOSIS — F419 Anxiety disorder, unspecified: Secondary | ICD-10-CM | POA: Diagnosis not present

## 2024-01-11 DIAGNOSIS — R918 Other nonspecific abnormal finding of lung field: Secondary | ICD-10-CM | POA: Diagnosis present

## 2024-01-11 DIAGNOSIS — C3491 Malignant neoplasm of unspecified part of right bronchus or lung: Secondary | ICD-10-CM | POA: Diagnosis not present

## 2024-01-11 DIAGNOSIS — G4730 Sleep apnea, unspecified: Secondary | ICD-10-CM | POA: Insufficient documentation

## 2024-01-11 DIAGNOSIS — I10 Essential (primary) hypertension: Secondary | ICD-10-CM | POA: Diagnosis not present

## 2024-01-11 DIAGNOSIS — E119 Type 2 diabetes mellitus without complications: Secondary | ICD-10-CM | POA: Insufficient documentation

## 2024-01-11 DIAGNOSIS — Z79899 Other long term (current) drug therapy: Secondary | ICD-10-CM | POA: Diagnosis not present

## 2024-01-11 HISTORY — PX: VIDEO BRONCHOSCOPY WITH ENDOBRONCHIAL ULTRASOUND: SHX6177

## 2024-01-11 HISTORY — PX: VIDEO BRONCHOSCOPY: SHX5072

## 2024-01-11 LAB — GLUCOSE, CAPILLARY: Glucose-Capillary: 131 mg/dL — ABNORMAL HIGH (ref 70–99)

## 2024-01-11 SURGERY — BRONCHOSCOPY, VIDEO-ASSISTED
Anesthesia: General

## 2024-01-11 MED ORDER — FENTANYL CITRATE (PF) 100 MCG/2ML IJ SOLN
INTRAMUSCULAR | Status: AC
  Filled 2024-01-11: qty 2

## 2024-01-11 MED ORDER — LACTATED RINGERS IV SOLN: INTRAVENOUS | Status: DC | PRN

## 2024-01-11 MED ORDER — DEXAMETHASONE SOD PHOSPHATE PF 10 MG/ML IJ SOLN
INTRAMUSCULAR | Status: DC | PRN
  Administered 2024-01-11: 10 mg via INTRAVENOUS

## 2024-01-11 MED ORDER — MIDAZOLAM HCL 2 MG/2ML IJ SOLN
INTRAMUSCULAR | Status: AC
  Filled 2024-01-11: qty 2

## 2024-01-11 MED ORDER — ROCURONIUM BROMIDE 100 MG/10ML IV SOLN
INTRAVENOUS | Status: DC | PRN
  Administered 2024-01-11: 50 mg via INTRAVENOUS

## 2024-01-11 MED ORDER — LIDOCAINE HCL (CARDIAC) PF 100 MG/5ML IV SOSY
PREFILLED_SYRINGE | INTRAVENOUS | Status: DC | PRN
  Administered 2024-01-11: 100 mg via INTRAVENOUS

## 2024-01-11 MED ORDER — ROCURONIUM BROMIDE 10 MG/ML (PF) SYRINGE
PREFILLED_SYRINGE | INTRAVENOUS | Status: AC
  Filled 2024-01-11: qty 10

## 2024-01-11 MED ORDER — SODIUM CHLORIDE 0.9 % IV SOLN: INTRAVENOUS | Status: DC

## 2024-01-11 MED ORDER — FENTANYL CITRATE (PF) 100 MCG/2ML IJ SOLN
INTRAMUSCULAR | Status: DC | PRN
  Administered 2024-01-11 (×2): 50 ug via INTRAVENOUS

## 2024-01-11 MED ORDER — CHLORHEXIDINE GLUCONATE 0.12 % MT SOLN
OROMUCOSAL | Status: AC
  Filled 2024-01-11: qty 15

## 2024-01-11 MED ORDER — CHLORHEXIDINE GLUCONATE 0.12 % MT SOLN
15.0000 mL | Freq: Once | OROMUCOSAL | Status: AC
  Administered 2024-01-11: 15 mL via OROMUCOSAL

## 2024-01-11 MED ORDER — MIDAZOLAM HCL (PF) 2 MG/2ML IJ SOLN
INTRAMUSCULAR | Status: DC | PRN
  Administered 2024-01-11: 2 mg via INTRAVENOUS

## 2024-01-11 MED ORDER — ORAL CARE MOUTH RINSE: 15.0000 mL | Freq: Once | OROMUCOSAL | Status: AC

## 2024-01-11 MED ORDER — PROPOFOL 10 MG/ML IV BOLUS
INTRAVENOUS | Status: DC | PRN
  Administered 2024-01-11: 120 mg via INTRAVENOUS
  Administered 2024-01-11: 100 ug/kg/min via INTRAVENOUS

## 2024-01-11 MED ORDER — PHENYLEPHRINE 80 MCG/ML (10ML) SYRINGE FOR IV PUSH (FOR BLOOD PRESSURE SUPPORT)
PREFILLED_SYRINGE | INTRAVENOUS | Status: DC | PRN
  Administered 2024-01-11 (×4): 160 ug via INTRAVENOUS

## 2024-01-11 MED ORDER — SUGAMMADEX SODIUM 200 MG/2ML IV SOLN
INTRAVENOUS | Status: DC | PRN
  Administered 2024-01-11: 200 mg via INTRAVENOUS

## 2024-01-11 NOTE — Transfer of Care (Signed)
 Immediate Anesthesia Transfer of Care Note  Patient: Vanessa Oneill  Procedure(s) Performed: BRONCHOSCOPY, VIDEO-ASSISTED BRONCHOSCOPY, WITH EBUS  Patient Location: PACU  Anesthesia Type:General  Level of Consciousness: awake, drowsy, and patient cooperative  Airway & Oxygen Therapy: Patient Spontanous Breathing  Post-op Assessment: Report given to RN, Post -op Vital signs reviewed and stable, and Patient moving all extremities X 4  Post vital signs: Reviewed and stable  Last Vitals:  Vitals Value Taken Time  BP 110/65 01/11/24 13:30  Temp    Pulse 75 01/11/24 13:33  Resp 22 01/11/24 13:33  SpO2 89 % 01/11/24 13:33  Vitals shown include unfiled device data.  Last Pain:  Vitals:   01/11/24 1048  PainSc: 4          Complications: No notable events documented.

## 2024-01-11 NOTE — Anesthesia Preprocedure Evaluation (Signed)
 "                                  Anesthesia Evaluation  Patient identified by MRN, date of birth, ID band Patient awake    Reviewed: Allergy & Precautions, H&P , NPO status , Patient's Chart, lab work & pertinent test results, reviewed documented beta blocker date and time   Airway Mallampati: II  TM Distance: >3 FB Neck ROM: full    Dental  (+) Teeth Intact   Pulmonary shortness of breath and with exertion, sleep apnea , COPD,  COPD inhaler, Current Smoker   Pulmonary exam normal        Cardiovascular Exercise Tolerance: Poor hypertension, On Medications + CAD  Normal cardiovascular exam Rhythm:regular Rate:Normal     Neuro/Psych  Headaches PSYCHIATRIC DISORDERS Anxiety Depression     Neuromuscular disease    GI/Hepatic Neg liver ROS,GERD  Medicated,,  Endo/Other  negative endocrine ROSdiabetes, Well Controlled    Renal/GU CRFRenal disease  negative genitourinary   Musculoskeletal   Abdominal   Peds  Hematology  (+) Blood dyscrasia, anemia   Anesthesia Other Findings Past Medical History: No date: Anxiety No date: Aortic atherosclerosis No date: Arthritis No date: Cerebral aneurysm No date: Cervical radiculitis No date: Colon adenomas No date: COPD (chronic obstructive pulmonary disease) (HCC) No date: Coronary artery disease No date: Depression No date: DM (diabetes mellitus), type 2 (HCC) No date: Dyspnea No date: ESBL (extended spectrum beta-lactamase) producing bacteria  infection No date: Esophageal varices (HCC) No date: Frequent falls No date: GERD (gastroesophageal reflux disease) No date: Headache No date: Hilar adenopathy No date: Hyperlipidemia No date: Hypertension No date: IDA (iron deficiency anemia) No date: Insomnia No date: Liver disease No date: Lumbar myelopathy (HCC) No date: Lung nodules No date: Mitral valve regurgitation No date: 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HCC) 11/12/2014: Personal history of tobacco use,  presenting hazards to  health No date: Portal venous hypertension (HCC) No date: Raynaud's disease No date: Septic shock (HCC) No date: Sleep apnea     Comment:  does not use cpap No date: Squamous cell carcinoma of right lung (HCC) No date: Thrombocytopenia No date: Tobacco use Past Surgical History: No date: ABDOMINAL HYSTERECTOMY No date: APPENDECTOMY No date: CHOLECYSTECTOMY 11/18/2018: COLONOSCOPY WITH PROPOFOL ; N/A     Comment:  Procedure: COLONOSCOPY WITH PROPOFOL ;  Surgeon: Toledo,               Ladell POUR, MD;  Location: [facility] END[facility];  Service:               Gastroenterology;  Laterality: N/A; 04/26/2022: COLONOSCOPY WITH PROPOFOL ; N/A     Comment:  Procedure: COLONOSCOPY WITH PROPOFOL ;  Surgeon: Toledo,               Ladell POUR, MD;  Location: [facility] END[facility];  Service:               Gastroenterology;  Laterality: N/A; 07/24/2016: ESOPHAGOGASTRODUODENOSCOPY (EGD) WITH PROPOFOL ; N/A     Comment:  Procedure: ESOPHAGOGASTRODUODENOSCOPY (EGD) WITH               PROPOFOL ;  Surgeon: Therisa Bi, MD;  Location: [facility]               ENDOSCOPY;  Service: Endoscopy;  Laterality: N/A; 11/21/2016: ESOPHAGOGASTRODUODENOSCOPY (EGD) WITH PROPOFOL ; N/A     Comment:  Procedure: ESOPHAGOGASTRODUODENOSCOPY (EGD) WITH  PROPOFOL ;  Surgeon: Toledo, Ladell POUR, MD;  Location:               [facility] END[facility];  Service: Gastroenterology;  Laterality:               N/A; 02/05/2018: ESOPHAGOGASTRODUODENOSCOPY (EGD) WITH PROPOFOL ; N/A     Comment:  Procedure: ESOPHAGOGASTRODUODENOSCOPY (EGD) WITH               PROPOFOL ;  Surgeon: Toledo, Ladell POUR, MD;  Location:               [facility] END[facility];  Service: Endoscopy;  Laterality: N/A; 04/26/2022: ESOPHAGOGASTRODUODENOSCOPY (EGD) WITH PROPOFOL ; N/A     Comment:  Procedure: ESOPHAGOGASTRODUODENOSCOPY (EGD) WITH               PROPOFOL ;  Surgeon: Toledo, Ladell POUR, MD;  Location:               [facility] END[facility];  Service: Gastroenterology;   Laterality:               N/A; No date: EYE SURGERY 03/23/2022: INTERCOSTAL NERVE BLOCK     Comment:  Procedure: INTERCOSTAL NERVE BLOCK;  Surgeon:               Kerrin Elspeth BROCKS, MD;  Location: [facility] OR;  Service:               Thoracic;; 03/23/2022: LYMPH NODE BIOPSY     Comment:  Procedure: LYMPH NODE BIOPSY;  Surgeon: Kerrin Elspeth BROCKS, MD;  Location: [facility] OR;  Service: Thoracic;; No date: TONSILLECTOMY 09/07/2023: VIDEO BRONCHOSCOPY WITH ENDOBRONCHIAL NAVIGATION; N/A     Comment:  Procedure: VIDEO BRONCHOSCOPY WITH ENDOBRONCHIAL               NAVIGATION;  Surgeon: Parris Manna, MD;  Location:               [facility] ORS;  Service: Thoracic;  Laterality: N/A; 09/07/2023: VIDEO BRONCHOSCOPY WITH ENDOBRONCHIAL ULTRASOUND; N/A     Comment:  Procedure: BRONCHOSCOPY, WITH EBUS;  Surgeon: Parris Manna, MD;  Location: [facility] ORS;  Service: Thoracic;                Laterality: N/A; 03/23/2022: XI ROBOTIC ASSISTED THORACOSCOPY- SEGMENTECTOMY; Right     Comment:  Procedure: XI ROBOTIC ASSISTED THORACOSCOPY-RIGHT LOWER               LOBE LATERAL BASILAR SEGMENTECTOMY;  Surgeon:               Kerrin Elspeth BROCKS, MD;  Location: [facility] OR;  Service:               Thoracic;  Laterality: Right; BMI    Body Mass Index: 24.14 kg/m     Reproductive/Obstetrics negative OB ROS                              Anesthesia Physical Anesthesia Plan  ASA: 3  Anesthesia Plan: General ETT   Post-op Pain Management:    Induction:   PONV Risk Score and Plan: 3  Airway Management Planned:   Additional Equipment:   Intra-op Plan:   Post-operative Plan:   Informed Consent: I have reviewed the patients History and Physical, chart, labs and discussed the procedure including the risks, benefits  and alternatives for the proposed anesthesia with the patient or authorized representative who has indicated his/her understanding and acceptance.      Dental Advisory Given  Plan Discussed with: CRNA  Anesthesia Plan Comments:         Anesthesia Quick Evaluation  "

## 2024-01-11 NOTE — Procedures (Signed)
 ROBOTIC NAVIGATIONAL BRONCHOSCOPY PROCEDURE NOTE 31627  FIBEROPTIC BRONCHOSCOPY WITH BRONCHOALVEOLAR LAVAGE PROCEDURE NOTE 31624  THERAPEUTIC ASPIRATION OF TRACHEOBRONCHIAL TREE 31645  ENDOBRONCHIAL ULTRASOUND X >/3 LYMPH NODE PROCEDURE NOTE  31653  TRANSBRONCHIAL FNA X 1 LOBE 31629  TRANSBRONCHIAL SURGICAL LUNG BIOPSY X 1 LOBE 31628  RADIAL ENDOBRONCHIAL [id number]  TRANSBRONCHIAL CYTOPATHOLOGY BRUSHINGS [id number]    Flexible bronchoscopy was performed  by : Parris MD  assistance by : 1)Repiratory therapist  and 2)cytotech staff and 3) Anesthesia team and 4) Flouroscopy team and 5) IT supporting staff for robotic platform   Indication for the procedure was :  Pre-procedural H&P. The following assessment was performed on the day of the procedure prior to initiating sedation History:  Chest pain n Dyspnea y Hemoptysis n Cough y Fever n Other pertinent items n  Examination Vital signs -reviewed as per nursing documentation today Cardiac    Murmurs: n  Rubs : n  Gallop: n Lungs Wheezing: n Rales : n Rhonchi :y  Other pertinent findings: SOB/hypoxemia due to chronic lung disease   Pre-procedural assessment for Procedural Sedation included: Depth of sedation: As per anesthesia team  ASA Classification:  2 Mallampati airway assessment: 3    Medication list reviewed: y  The patients interval history was taken and revealed: no new complaints The pre- procedure physical examination revealed: No new findings Refer to prior clinic note for details.  Informed Consent: Informed consent was obtained from:  patient after explanation of procedure and risks, benefits, as well as alternative procedures available.  Explanation of level of sedation and possible transfusion was also provided.    Procedural Preparation: Time out was performed and patient was identified by name and birthdate and procedure to be performed and side for sampling, if any, was specified. Pt  was intubated by anesthesia.  The patient was appropriately draped.   Fiberoptic bronchoscopy with airway inspection, therapeutic aspiration of tracheobronchial tree and BAL Procedure findings:  Bronchoscope was inserted via ETT  without difficulty.  Posterior oropharynx, epiglottis, arytenoids, false cords and vocal cords were not visualized as these were bypassed by endotracheal tube. The distal trachea was normal in circumference and appearance without mucosal, cartilaginous or branching abnormalities.  The main carina was mildly splayed . All right and left lobar airways were NOT visualized to the Subsegmental level due to obstructed airways from phlegm and mucus  plugging. Mucus plugging with partial to complete airway obstruction was noted bilaterally and treated with therapeutic aspiration prior to beginning of robotic bronchoscopy to allow adequate visualization and accurate lung biopsy and improve patients respiratory status.   The mucosa was : friable at target segment  Airways were notable for:        exophytic lesions :n       extrinsic compression in the following distributions: n.       Friable mucosa: y       Teacher, music /pigmentation: n     Post procedure Diagnosis:   Mucus plugging of tracheobronchial tree      Navigational Bronchoscopy Procedure Findings:   Ion robotic platform utilized for this procedure. Post appropriate planning and registration peripheral navigation was used to visualize target lesion.   Radial endobronchial US  technology utilized during this procedure for confirmation of lesion.  3D Fluoroscopy utilized for tool in lesion confirmation    Target 1 Left upper lobe - Transbronchial cytobrushing x 1 ,   Transbronchial FNA x 4,   Transbronchial surgical pathology x 4 ,    BAL was  performed at this location and sent for processing with cytology and microbiology  Due to insufficient tissue via FNA a surgical lung biopsy via surgical  forceps was required for adequate pathology tissue evaluation  Post procedure diagnosis:  Cavitary nodule of left upper lobe       Endobronchial ultrasound assisted hilar and mediastinal lymph node biopsies procedure findings: The fiberoptic bronchoscope was removed and the EBUS scope was introduced. Examination began to evaluate for pathologically enlarged lymph nodes starting on the left side progressing to the right side.  All lymph node biopsies performed with 21g needle. Lymph node biopsies were sent in cytolite for all stations.  Station 4L - 6mm not biopsied Station 10L - 9mm biopsied 3 times Station 7 - 1.1cm biopsied 4 times +RMPI Station 4R - 1.2cm biopsied 4 times +RMPI Station 10R - 5mm not biopsied   Post procedure diagnosis:  hilar lymphadenopathy concerning for lymphoma or cancer recurrence     Immediate sampling complications included:NONE immediate  Epinephrine ZERO ml was used topically  The bronchoscopy was terminated due to completion of the planned procedure and the bronchoscope was removed.   Total dosage of Lidocaine  was ZERO mg  Estimated Blood loss: EXPECTED < 5cc.  Complications included:  NONE   Preliminary CXR findings :  IN PROCESS  Disposition: HOME WITH FAMILY   Follow up with Dr. Rayjon Wery in 5 days for result discussion.     Halina Picking MD  [facility] Division of Pulmonary & Critical Care Medicine

## 2024-01-11 NOTE — Anesthesia Procedure Notes (Signed)
 Procedure Name: Intubation Date/Time: 01/11/2024 12:00 PM  Performed by: Bonnetta Jimmey SAUNDERS, CRNAPre-anesthesia Checklist: Patient identified, Emergency Drugs available, Suction available and Patient being monitored Patient Re-evaluated:Patient Re-evaluated prior to induction Oxygen Delivery Method: Circle system utilized Preoxygenation: Pre-oxygenation with 100% oxygen Induction Type: IV induction Ventilation: Mask ventilation without difficulty Laryngoscope Size: McGrath and 3 Grade View: Grade I Tube type: Oral Tube size: 8.0 mm Number of attempts: 1 Airway Equipment and Method: Stylet and Oral airway Placement Confirmation: ETT inserted through vocal cords under direct vision, positive ETCO2 and breath sounds checked- equal and bilateral Secured at: 20 cm Tube secured with: Tape Dental Injury: Teeth and Oropharynx as per pre-operative assessment

## 2024-01-11 NOTE — H&P (Signed)
 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  She has completed treatment well however she had a recent PET scan that showed hypermetabolic mediastinal left hilar lymph node activity concerning for disease recurrence.  Oncology has reached out to me regarding these findings and I met with patient who reviewed the imaging and we agreed to perform bronchoscopy with additional biopsies of mediastinal and hilar lymph node stations as well as some of the nodules that were seen.  Interestingly the nodules appear to have become smaller on the repeat CT for Ion procedure which is consistent with infectious or inflammatory etiology.  She had MRI brain which is negative for metastasis.  Today the patient is here for bronchoscopic airway examination as well as bronchoalveolar lavage to rule out any infectious etiology considering she does have some small cavitary nodules peripherally bilaterally, this will be followed by bronchoalveolar lavage for microbiology and then the navigational bronchoscopy will be performed for biopsy and lastly organ to perform endobronchial ultrasound for lymph node evaluation.  Reviewed risks/complications and benefits with patient, risks include infection, pneumothorax/pneumomediastinum which may require chest tube placement as well as overnight/prolonged hospitalization and possible mechanical ventilation. Other risks include bleeding and very rarely death.  Patient understands risks and wishes to proceed.  Additional questions were answered, and  patient is aware that post procedure patient will be going home with family and may experience cough with possible clots on expectoration as well as phlegm which may last few days as well as hoarseness of voice post intubation and mechanical ventilation.  PAST MEDICAL HISTORY   Past Medical History:  Diagnosis Date   Anxiety    Aortic atherosclerosis    Arthritis    Cerebral aneurysm    Cervical radiculitis    Colon adenomas    COPD (chronic obstructive pulmonary disease) (HCC)    Coronary artery disease    Depression    DM (diabetes mellitus), type 2 (HCC)    Dyspnea    ESBL (extended spectrum beta-lactamase) producing bacteria infection    Esophageal varices (HCC)    Frequent falls    GERD (gastroesophageal reflux disease)    Headache    Hilar adenopathy    Hyperlipidemia    Hypertension    IDA (iron deficiency anemia)    Insomnia    Liver disease    Lumbar myelopathy (HCC)    Lung nodules    Mitral valve regurgitation    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HCC)    Personal history of tobacco use, presenting hazards to health 11/12/2014   Portal venous hypertension (HCC)    Raynaud's disease    Septic shock (HCC)    Sleep apnea    does not use cpap   Squamous cell carcinoma of right lung (HCC)    Thrombocytopenia    Tobacco use      SURGICAL HISTORY   Past Surgical History:  Procedure Laterality Date   ABDOMINAL  HYSTERECTOMY     APPENDECTOMY     CHOLECYSTECTOMY     COLONOSCOPY WITH PROPOFOL  N/A 11/18/2018   Procedure: COLONOSCOPY WITH PROPOFOL ;  Surgeon: Toledo, Ladell POUR, MD;  Location: [facility] ENDOSCOPY;  Service: Gastroenterology;  Laterality: N/A;   COLONOSCOPY WITH PROPOFOL  N/A 04/26/2022   Procedure: COLONOSCOPY WITH PROPOFOL ;  Surgeon: Toledo, Ladell POUR, MD;  Location: [facility] ENDOSCOPY;  Service: Gastroenterology;  Laterality: N/A;   ESOPHAGOGASTRODUODENOSCOPY (EGD) WITH PROPOFOL  N/A 07/24/2016   Procedure: ESOPHAGOGASTRODUODENOSCOPY (EGD) WITH PROPOFOL ;  Surgeon:  Therisa Bi, MD;  Location: [facility] ENDOSCOPY;  Service: Endoscopy;  Laterality: N/A;   ESOPHAGOGASTRODUODENOSCOPY (EGD) WITH PROPOFOL  N/A 11/21/2016   Procedure: ESOPHAGOGASTRODUODENOSCOPY (EGD) WITH PROPOFOL ;  Surgeon: Toledo, Ladell POUR, MD;  Location: [facility] ENDOSCOPY;  Service: Gastroenterology;  Laterality: N/A;   ESOPHAGOGASTRODUODENOSCOPY (EGD) WITH PROPOFOL  N/A 02/05/2018   Procedure: ESOPHAGOGASTRODUODENOSCOPY (EGD) WITH PROPOFOL ;  Surgeon: Toledo, Ladell POUR, MD;  Location: [facility] ENDOSCOPY;  Service: Endoscopy;  Laterality: N/A;   ESOPHAGOGASTRODUODENOSCOPY (EGD) WITH PROPOFOL  N/A 04/26/2022   Procedure: ESOPHAGOGASTRODUODENOSCOPY (EGD) WITH PROPOFOL ;  Surgeon: Toledo, Ladell POUR, MD;  Location: [facility] ENDOSCOPY;  Service: Gastroenterology;  Laterality: N/A;   EYE SURGERY     INTERCOSTAL NERVE BLOCK  03/23/2022   Procedure: INTERCOSTAL NERVE BLOCK;  Surgeon: Kerrin Elspeth BROCKS, MD;  Location: [facility] OR;  Service: Thoracic;;   LYMPH NODE BIOPSY  03/23/2022   Procedure: LYMPH NODE BIOPSY;  Surgeon: Kerrin Elspeth BROCKS, MD;  Location: [facility] OR;  Service: Thoracic;;   TONSILLECTOMY     VIDEO BRONCHOSCOPY WITH ENDOBRONCHIAL NAVIGATION N/A 09/07/2023   Procedure: VIDEO BRONCHOSCOPY WITH ENDOBRONCHIAL NAVIGATION;  Surgeon: Parris Manna, MD;  Location: [facility] ORS;  Service: Thoracic;  Laterality: N/A;   VIDEO BRONCHOSCOPY WITH ENDOBRONCHIAL ULTRASOUND N/A 09/07/2023   Procedure: BRONCHOSCOPY, WITH EBUS;  Surgeon: Parris Manna, MD;  Location: [facility] ORS;  Service: Thoracic;  Laterality: N/A;   XI ROBOTIC ASSISTED THORACOSCOPY- SEGMENTECTOMY Right 03/23/2022   Procedure: XI ROBOTIC ASSISTED THORACOSCOPY-RIGHT LOWER LOBE LATERAL BASILAR SEGMENTECTOMY;  Surgeon: Kerrin Elspeth BROCKS, MD;  Location: [facility] OR;  Service: Thoracic;  Laterality: Right;     FAMILY HISTORY   Family History  Problem Relation Age of Onset   Lung cancer Father    Heart Problems Father    Heart failure Mother    Breast cancer  Neg Hx      SOCIAL HISTORY   Social History[1]   MEDICATIONS    Home Medication:    Current Medication: Current Medications[2]    ALLERGIES   Codeine, Tylenol  with codeine #3 [acetaminophen -codeine], Amoxicillin, Lipitor [atorvastatin], Lisinopril, and Penicillins     REVIEW OF SYSTEMS    Review of Systems:  Gen:  Denies  fever, sweats, chills weigh loss  HEENT: Denies blurred vision, double vision, ear pain, eye pain, hearing loss, nose bleeds, sore throat Cardiac:  No dizziness, chest pain or heaviness, chest tightness,edema Resp:   reports dyspnea chronically  Gi: Denies swallowing difficulty, stomach pain, nausea or vomiting, diarrhea, constipation, bowel incontinence Gu:  Denies bladder incontinence, burning urine Ext:   Denies Joint pain, stiffness or swelling Skin: Denies  skin rash, easy bruising or bleeding or hives Endoc:  Denies polyuria, polydipsia , polyphagia or weight change Psych:   Denies depression, insomnia or hallucinations   Other:  All other systems negative   VS: Wt 69.9 kg   BMI 24.14 kg/m      PHYSICAL EXAM    GENERAL:NAD, no fevers, chills, no weakness no fatigue HEAD: Normocephalic, atraumatic.  EYES: Pupils  equal, round, reactive to light. Extraocular muscles intact. No scleral icterus.  MOUTH: Moist mucosal membrane. Dentition intact. No abscess noted.  EAR, NOSE, THROAT: Clear without exudates. No external lesions.  NECK: Supple. No thyromegaly. No nodules. No JVD.  PULMONARY: decreased breath sounds with mild rhonchi worse at bases bilaterally.  CARDIOVASCULAR: S1 and S2. Regular rate and rhythm. No murmurs, rubs, or gallops. No edema. Pedal pulses 2+ bilaterally.  GASTROINTESTINAL: Soft, nontender, nondistended. No masses. Positive bowel sounds. No hepatosplenomegaly.  MUSCULOSKELETAL: No swelling, clubbing, or edema. Range of motion full in all extremities.  NEUROLOGIC: Cranial nerves II through XII are intact. No gross  focal neurological deficits. Sensation intact. Reflexes intact.  SKIN: No ulceration, lesions, rashes, or cyanosis. Skin warm and dry. Turgor intact.  PSYCHIATRIC: Mood, affect within normal limits. The patient is awake, alert and oriented x 3. Insight, judgment intact.       IMAGING   Narrative & Impression  EXAM: MRI BRAIN WITH AND WITHOUT CONTRAST 12/25/2023 03:32:49 PM   TECHNIQUE: Multiplanar multisequence MRI of the head/brain was performed with and without the administration of intravenous contrast.   CONTRAST: 7 mL of Gadavist .   COMPARISON: MR Head 01/13/2016; MR Head 01/27/2015.   CLINICAL HISTORY: 74 year old female. Patient complains of feeling off balance and an increase in falls for the past 2-3 months. History of right lower lobe lung cancer status post wedge resection, and cirrhosis.   FINDINGS:   BRAIN AND VENTRICLES: No acute infarct. No acute intracranial hemorrhage. No mass effect or midline shift. No hydrocephalus. The sella is unremarkable. Normal flow voids. The major dural venous sinuses are enhancing and appear patent. Chronic intrinsic T1 hyperintensity in the globus pallidus is less apparent but not resolved, compatible with chronic cirrhosis. Brain volume is within normal limits for age. Patchy and scattered bilateral cerebral white matter T2 and FLAIR hyperintensity is progressed, moderate for age, with some asymmetric deep white matter capsule involvement which is greater on the right. Similar progressed mild to moderate T2 and FLAIR heterogeneity in the pons. No cortical encephalomalacia. No chronic cerebral blood products on SWI. Incidental developmental venous anomaly right inferior parietal lobe near the right parietooccipital sulcus (series 18 image 107), normal variant. No abnormal intracranial enhancement or dural thickening. Grossly normal visible internal auditory structures.   ORBITS: No acute abnormality.    SINUSES: Paranasal sinuses and mastoids are well aerated.   BONES AND SOFT TISSUES: Bone marrow signal is stable and within normal limits. No acute soft tissue abnormality. Negative for age visible cervical spine.   IMPRESSION: 1. No metastatic disease or  acute intracranial abnormality identified. 2. Progressed since 2018 and moderately advanced signal changes in the cerebral white matter and pons, most commonly due to small vessel disease.   Electronically signed by: Helayne Hurst MD 01/09/2024 07:53 AM EST RP Workstation: [id number]      ASSESSMENT/PLAN   Right lower lobe lung cancer     - PET scan with hilar/medistinal adenopathy and bilateral nodules concerning for recurrence   -Today the patient is here for bronchoscopic airway examination as well as bronchoalveolar lavage to rule out any infectious etiology considering she does have some small cavitary nodules peripherally bilaterally, this will be followed by navigational bronchoscopy will be performed for biopsy and lastly endobronchial ultrasound for lymph node evaluation.  Reviewed risks/complications and benefits with patient, risks include infection, pneumothorax/pneumomediastinum which may require chest tube placement as well as overnight/prolonged hospitalization and possible mechanical ventilation. Other risks include bleeding and very  rarely death.  Patient understands risks and wishes to proceed.  Additional questions were answered, and patient is aware that post procedure patient will be going home with family and may experience cough with possible clots on expectoration as well as phlegm which may last few days as well as hoarseness of voice post intubation and mechanical ventilation.            Thank you for allowing me to participate in the care of this patient.   Patient/Family are satisfied with care plan and all questions have been answered.    Provider disclosure: Patient with at least one acute or  chronic illness or injury that poses a threat to life or bodily function and is being managed actively during this encounter.  All of the below services have been performed independently by signing provider:  review of prior documentation from internal and or external health records.  Review of previous and current lab results.  Interview and comprehensive assessment during patient visit today. Review of current and previous chest radiographs/CT scans. Discussion of management and test interpretation with health care team and patient/family.   This document was prepared using Dragon voice recognition software and may include unintentional dictation errors.     Immaculate Crutcher, M.D.  Division of Pulmonary & Critical Care Medicine             [1]  Social History Tobacco Use   Smoking status: Every Day    Current packs/day: 0.00    Average packs/day: 1 pack/day for 48.0 years (48.0 ttl pk-yrs)    Types: Cigarettes    Start date: 03/23/1974    Last attempt to quit: 03/23/2022    Years since quitting: 1.8   Smokeless tobacco: Never   Tobacco comments:    patient refused  Vaping Use   Vaping status: Never Used  Substance Use Topics   Alcohol use: No    Alcohol/week: 0.0 standard drinks of alcohol   Drug use: No  [2] No current facility-administered medications for this encounter.  "

## 2024-01-14 ENCOUNTER — Encounter: Payer: Self-pay | Admitting: Pulmonary Disease

## 2024-01-14 LAB — CULTURE, BAL-QUANTITATIVE W GRAM STAIN: Culture: 1000 — AB

## 2024-01-15 ENCOUNTER — Encounter: Payer: Self-pay | Admitting: Pulmonary Disease

## 2024-01-15 LAB — CYTOLOGY - NON PAP

## 2024-01-15 LAB — SURGICAL PATHOLOGY

## 2024-01-17 ENCOUNTER — Ambulatory Visit
Attending: Student in an Organized Health Care Education/Training Program | Admitting: Student in an Organized Health Care Education/Training Program

## 2024-01-17 ENCOUNTER — Encounter: Payer: Self-pay | Admitting: Student in an Organized Health Care Education/Training Program

## 2024-01-17 VITALS — BP 116/93 | HR 76 | Temp 97.7°F | Resp 16 | Ht 67.0 in | Wt 150.0 lb

## 2024-01-17 DIAGNOSIS — M47812 Spondylosis without myelopathy or radiculopathy, cervical region: Secondary | ICD-10-CM | POA: Insufficient documentation

## 2024-01-17 DIAGNOSIS — M542 Cervicalgia: Secondary | ICD-10-CM | POA: Insufficient documentation

## 2024-01-17 DIAGNOSIS — M5481 Occipital neuralgia: Secondary | ICD-10-CM | POA: Diagnosis not present

## 2024-01-17 DIAGNOSIS — G894 Chronic pain syndrome: Secondary | ICD-10-CM | POA: Insufficient documentation

## 2024-01-17 NOTE — Progress Notes (Signed)
 Safety precautions to be maintained throughout the outpatient stay will include: orient to surroundings, keep bed in low position, maintain call bell within reach at all times, provide assistance with transfer out of bed and ambulation.

## 2024-01-17 NOTE — Progress Notes (Signed)
 PROVIDER NOTE: Interpretation of information contained herein should be left to medically-trained personnel. Specific patient instructions are provided elsewhere under Patient Instructions section of medical record. This document was created in part using AI and STT-dictation technology, any transcriptional errors that may result from this process are unintentional.  Patient: Vanessa Oneill  Service: E/M   PCP: Fernande Ophelia JINNY DOUGLAS, MD  DOB: 1950/01/06  DOS: 01/17/2024  Provider: Wallie Sherry, MD  MRN: [id number]  Delivery: Face-to-face  Specialty: Interventional Pain Management  Type: Established Patient  Setting: Ambulatory outpatient facility  Specialty designation: 09  Referring Prov.: Fernande Ophelia JINNY DOUGLAS, MD  Location: Outpatient office facility       History of present illness (HPI) Vanessa Oneill, a 74 y.o. year old female, is here today because of her Cervical facet syndrome [M47.812]. Ms. Abt primary complain today is Neck Pain (Left side.  The pain is increased after a fall.  ) and Leg Pain (Bilateral,  feeling of weakness)  Pertinent problems: Vanessa Oneill does not have any pertinent problems on file.  Pain Assessment: Severity of Chronic pain is reported as a 6 /10. Location: Neck Left/into shoulder and shoulder blade. Onset: More than a month ago. Quality: Sharp, Discomfort, Constant. Timing: Constant. Modifying factor(s): heat helps a little. Vitals:  height is 5' 7 (1.702 m) and weight is 150 lb (68 kg). Her temporal temperature is 97.7 F (36.5 C). Her blood pressure is 116/93 (abnormal) and her pulse is 76. Her respiration is 16 and oxygen saturation is 99%.  BMI: Estimated body mass index is 23.49 kg/m as calculated from the following:   Height as of this encounter: 5' 7 (1.702 m).   Weight as of this encounter: 150 lb (68 kg).  Last encounter: 07/03/2023. Last procedure: 10/31/2023.  Reason for encounter:   Increased cervicalgia and occipital neuralgia related to  cervical facet joint syndrome and occipital neuralgia that responded favorably to bilateral C3, C4, C5, C6, C7 cervical facet medial branch nerve block done 10/31/2023 along with bilateral occipital nerve block pain.  Unfortunately the patient sustained a fall in December which has aggravated her cervical spine pain and occipital neuralgia.  She would like to repeat the injections as they were providing her with analgesic and functional benefit (provided 80% pain relief for 6 weeks until fall)  ROS  Constitutional: Denies any fever or chills Gastrointestinal: No reported hemesis, hematochezia, vomiting, or acute GI distress Musculoskeletal: Cervicalgia Neurological: Occipital neuralgia  Medication Review  HYDROcodone -acetaminophen , Melatonin, Oxygen-Helium, Semaglutide (1 MG/DOSE), albuterol , budesonide -glycopyrrolate -formoterol, cholecalciferol, cyclobenzaprine , dexamethasone , fluticasone, gabapentin , insulin  NPH Human, ipratropium, levofloxacin , metFORMIN , methylPREDNISolone , nadolol , omeprazole, rOPINIRole , torsemide, and traZODone   History Review  Allergy: Vanessa Oneill is allergic to codeine, tylenol  with codeine #3 [acetaminophen -codeine], amoxicillin, lipitor [atorvastatin], lisinopril, and penicillins. Drug: Vanessa Oneill  reports no history of drug use. Alcohol:  reports no history of alcohol use. Tobacco:  reports that she has been smoking cigarettes. She started smoking about 49 years ago. She has a 48 pack-year smoking history. She has never used smokeless tobacco. Social: Vanessa Oneill  reports that she has been smoking cigarettes. She started smoking about 49 years ago. She has a 48 pack-year smoking history. She has never used smokeless tobacco. She reports that she does not drink alcohol and does not use drugs. Medical:  has a past medical history of Anxiety, Aortic atherosclerosis, Arthritis, Cerebral aneurysm, Cervical radiculitis, Colon adenomas, COPD (chronic obstructive pulmonary  disease) (HCC), Coronary artery disease, Depression, DM (diabetes mellitus), type  2 (HCC), Dyspnea, ESBL (extended spectrum beta-lactamase) producing bacteria infection, Esophageal varices (HCC), Frequent falls, GERD (gastroesophageal reflux disease), Headache, Hilar adenopathy, Hyperlipidemia, Hypertension, IDA (iron deficiency anemia), Insomnia, Liver disease, Lumbar myelopathy (HCC), Lung nodules, Mitral valve regurgitation, 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HCC), Personal history of tobacco use, presenting hazards to health (11/12/2014), Portal venous hypertension (HCC), Raynaud's disease, Septic shock (HCC), Sleep apnea, Squamous cell carcinoma of right lung (HCC), Thrombocytopenia, and Tobacco use. Surgical: Vanessa Oneill  has a past surgical history that includes Cholecystectomy; Abdominal hysterectomy; Esophagogastroduodenoscopy (egd) with propofol  (N/A, 07/24/2016); Appendectomy; Tonsillectomy; Esophagogastroduodenoscopy (egd) with propofol  (N/A, 11/21/2016); Esophagogastroduodenoscopy (egd) with propofol  (N/A, 02/05/2018); Colonoscopy with propofol  (N/A, 11/18/2018); Xi robotic assisted thoracoscopy- segmentectomy (Right, 03/23/2022); Intercostal nerve block (03/23/2022); Lymph node biopsy (03/23/2022); Colonoscopy with propofol  (N/A, 04/26/2022); Esophagogastroduodenoscopy (egd) with propofol  (N/A, 04/26/2022); Video bronchoscopy with endobronchial navigation (N/A, 09/07/2023); Video bronchoscopy with endobronchial ultrasound (N/A, 09/07/2023); Eye surgery; Video bronchoscopy (N/A, 01/11/2024); and Video bronchoscopy with endobronchial ultrasound (N/A, 01/11/2024). Family: family history includes Heart Problems in her father; Heart failure in her mother; Lung cancer in her father.  Laboratory Chemistry Profile   Renal Lab Results  Component Value Date   BUN 9 07/26/2023   CREATININE 0.74 07/26/2023   GFRAA 56 (L) 08/12/2017   GFRNONAA >60 07/26/2023    Hepatic Lab Results  Component Value Date   AST 33  07/26/2023   ALT 17 07/26/2023   ALBUMIN  3.9 07/26/2023   ALKPHOS 119 07/26/2023   LIPASE 24 11/28/2016   AMMONIA 40 (H) 10/30/2023    Electrolytes Lab Results  Component Value Date   NA 134 (L) 07/26/2023   K 4.1 07/26/2023   CL 102 07/26/2023   CALCIUM 8.6 (L) 07/26/2023    Bone No results found for: VD25OH, [id number], CI6874NY7, CI7874NY7, 25OHVITD1, 25OHVITD2, 25OHVITD3, TESTOFREE, TESTOSTERONE  Inflammation (CRP: Acute Phase) (ESR: Chronic Phase) Lab Results  Component Value Date   CRP 16.5 (H) 10/23/2016   ESRSEDRATE 91 (H) 10/24/2016   LATICACIDVEN 1.6 08/10/2017         Note: Above Lab results reviewed.  Recent Imaging Review  DG Chest Port 1 View EXAM: 1 VIEW(S) XRAY OF THE CHEST 01/11/2024 01:53:00 PM  COMPARISON: 09/07/2023  CLINICAL HISTORY: S/P bronchoscopy  FINDINGS:  LUNGS AND PLEURA: Low lung volumes. Postoperative changes of right lower lung. Increased right basilar opacities. Similar chronic interstitial prominence. Small right pleural effusion. No pneumothorax.  HEART AND MEDIASTINUM: No acute abnormality of the cardiac and mediastinal silhouettes.  BONES AND SOFT TISSUES: No acute osseous abnormality.  IMPRESSION: 1. Increased right basilar opacities and small right pleural effusion. 2. Similar chronic interstitial prominence.  Electronically signed by: Franky Stanford MD MD 01/11/2024 08:49 PM EST RP Workstation: [id number] DG C-Arm 1-60 Min-No Report Fluoroscopy was utilized by the requesting physician.  No radiographic  interpretation.   Cervical Imaging: Cervical MR wo contrast: Results for orders placed during the hospital encounter of 03/15/23   MR CERVICAL SPINE WO CONTRAST   Narrative CLINICAL DATA:  Cervicalgia   EXAM: MRI CERVICAL SPINE WITHOUT CONTRAST   TECHNIQUE: Multiplanar, multisequence MR imaging of the cervical spine was performed. No intravenous contrast was administered.   COMPARISON:   None Available.   FINDINGS: Alignment: Grade 1 anterolisthesis of C3 on C4.   Vertebrae: Vertebral bodies demonstrate normal signal intensity. No acute fracture is identified.   Cord: Normal signal and morphology.   Posterior Fossa, vertebral arteries, paraspinal tissues: The visualized portions of the skull base and the posterior fossa are  normal. No soft tissue abnormality is identified.   Disc levels:   C2-C3: The disk is normal in configuration. No facet arthropathy. No uncovertebral joint disease. No neuroforaminal stenosis. No spinal canal stenosis.   C3-C4: Disc osteophyte complex. Moderate left facet arthropathy. No uncovertebral joint disease. No neuroforaminal stenosis. No spinal canal stenosis.   C4-C5: Disc osteophyte complex. No facet arthropathy. No uncovertebral joint disease. No neuroforaminal stenosis. Mild spinal canal stenosis.   C5-C6: Disc osteophyte complex. No facet arthropathy. No uncovertebral joint disease. No neuroforaminal stenosis. Mild spinal canal stenosis.   C6-C7: Disc osteophyte complex. Mild bilateral facet arthropathy. No uncovertebral joint disease. No neuroforaminal stenosis. Mild spinal canal stenosis.   C7-T1: The disk is normal in configuration. No facet arthropathy. No uncovertebral joint disease. No neuroforaminal stenosis. No spinal canal stenosis.   IMPRESSION: Mild canal stenoses C4-C5, C5-C6, and C6-C7 secondary to disc osteophyte complexes. No significant foraminal stenosis.     Electronically Signed By: Clem Savory M.D. On: 04/11/2023 09:36    Note: Reviewed        Physical Exam  Vitals: BP (!) 116/93 (BP Location: Left Arm, Patient Position: Sitting, Cuff Size: Normal)   Pulse 76   Temp 97.7 F (36.5 C) (Temporal)   Resp 16   Ht 5' 7 (1.702 m)   Wt 150 lb (68 kg)   SpO2 99%   BMI 23.49 kg/m  BMI: Estimated body mass index is 23.49 kg/m as calculated from the following:   Height as of this encounter:  5' 7 (1.702 m).   Weight as of this encounter: 150 lb (68 kg). Ideal: Ideal body weight: 61.6 kg (135 lb 12.9 oz) Adjusted ideal body weight: 64.2 kg (141 lb 7.7 oz) General appearance: Well nourished, well developed, and well hydrated. In no apparent acute distress Mental status: Alert, oriented x 3 (person, place, & time)       Respiratory: No evidence of acute respiratory distress Eyes: PERLA  Cervical Spine Area Exam  Skin & Axial Inspection: No masses, redness, edema, swelling, or associated skin lesions Alignment: Symmetrical Functional ROM: Pain restricted ROM      Stability: No instability detected Muscle Tone/Strength: Functionally intact. No obvious neuro-muscular anomalies detected. Sensory (Neurological): Musculoskeletal pain pattern and neurogenic Palpation: No palpable anomalies             Upper Extremity (UE) Exam    Side: Right upper extremity  Side: Left upper extremity  Skin & Extremity Inspection: Skin color, temperature, and hair growth are WNL. No peripheral edema or cyanosis. No masses, redness, swelling, asymmetry, or associated skin lesions. No contractures.  Skin & Extremity Inspection: Skin color, temperature, and hair growth are WNL. No peripheral edema or cyanosis. No masses, redness, swelling, asymmetry, or associated skin lesions. No contractures.  Functional ROM: Unrestricted ROM          Functional ROM: Unrestricted ROM          Muscle Tone/Strength: Functionally intact. No obvious neuro-muscular anomalies detected.  Muscle Tone/Strength: Functionally intact. No obvious neuro-muscular anomalies detected.  Sensory (Neurological): Unimpaired          Sensory (Neurological): Unimpaired          Palpation: No palpable anomalies              Palpation: No palpable anomalies              Provocative Test(s):  Phalen's test: deferred Tinel's test: deferred Apley's scratch test (touch opposite shoulder):  Action 1 (  Across chest): deferred Action 2 (Overhead):  deferred Action 3 (LB reach): deferred   Provocative Test(s):  Phalen's test: deferred Tinel's test: deferred Apley's scratch test (touch opposite shoulder):  Action 1 (Across chest): deferred Action 2 (Overhead): deferred Action 3 (LB reach): deferred     Assessment   Diagnosis  1. Cervical facet syndrome   2. Cervicalgia   3. Bilateral occipital neuralgia   4. Chronic pain syndrome      Updated Problems: No problems updated.  Plan of Care  The patient has chronic cervical facet-mediated pain and occipital neuralgia, previously treated with bilateral C3-C7 cervical facet medial branch nerve blocks and bilateral occipital nerve blocks performed on 10/31/2023, which resulted in approximately 80% pain relief and significant functional improvement lasting six weeks. Unfortunately, she sustained a fall in December, which exacerbated her cervicalgia and occipital neuralgia, leading to recurrence of her symptoms. Given her prior robust and reproducible response to diagnostic and therapeutic nerve blocks, repeat bilateral cervical medial branch nerve blocks at C3, C4, C5, C6, and C7 levels along with bilateral occipital nerve blocks are medically indicated to reduce pain, improve function, and confirm continued facetogenic and occipital nerve-mediated pain generators. The risks, benefits, and alternatives of repeat injections were discussed, including bleeding, infection, nerve injury, transient pain flare, and steroid-related side effects, and the patient wishes to proceed. Should she continue to demonstrate significant but temporary relief from repeat medial branch blocks, she will be considered for cervical medial branch radiofrequency ablation for longer-term pain control. If occipital neuralgia remains refractory or recurs despite injections, peripheral nerve stimulation of the occipital nerves may also be considered in the future as part of a multimodal treatment strategy. Orders:  Orders  Placed This Encounter  Procedures   CERVICAL FACET (MEDIAL BRANCH NERVE BLOCK)     Standing Status:   Future    Expiration Date:   04/16/2024    Scheduling Instructions:     Procedure: Cervical Facet Medial Branch Block(s) #2     Laterality: Bilateral      Level: C3, C4, C6, and C7 Medial Branch Level(s). Injecting these levels blocks the C3-4, C4-5, and C6-7 cervical facet joints.      Imaging: Fluoroscopic guidance     Anesthesia: Local anesthesia (1-2% Lidocaine )     Sedation: Moderate Sedation    Where will this procedure be performed?:   [facility]   GREATER OCCIPITAL NERVE BLOCK    Standing Status:   Future    Expected Date:   01/31/2024    Expiration Date:   01/16/2025    Scheduling Instructions:     Procedure: Occipital nerve block     Laterality: Bilateral     Sedation: Patient's choice.     Timeframe: ASAA    Where will this procedure be performed?:   [facility]     B/L L3,4,5 MBNB 08/01/23, 08/22/2023 B/l C3,4 + C6,7 MBNB + B/L GONB 10/31/23   Return in about 18 days (around 02/04/2024) for B/L GONB + C3, C4, C6, C7 , ECT (block 40 mins).    Recent Visits Date Type Provider Dept  11/06/23 Office Visit Patel, Seema K, NP [facility]  10/31/23 Procedure visit Marcelino Nurse, MD [facility]  Showing recent visits within past 90 days and meeting all other requirements Today's Visits Date Type Provider Dept  01/17/24 Office Visit Marcelino Nurse, MD [facility]  Showing today's visits and meeting all other requirements Future Appointments Date Type Provider Dept  02/04/24 Appointment Marcelino Nurse, MD [facility]  Showing future appointments within next 90 days and meeting all other requirements  I discussed the assessment and treatment plan with the patient. The patient was provided an opportunity to ask questions and all were answered. The patient agreed with the plan and demonstrated an understanding of the  instructions.  Patient advised to call back or seek an in-person evaluation if the symptoms or condition worsens.  I personally spent a total of 30 minutes in the care of the patient today including preparing to see the patient, getting/reviewing separately obtained history, performing a medically appropriate exam/evaluation, counseling and educating, placing orders, and documenting clinical information in the EHR.   Note by: Nurse Marcelino, MD (TTS and AI technology used. I apologize for any typographical errors that were not detected and corrected.) Date: 01/17/2024; Time: 3:15 PM

## 2024-01-17 NOTE — Patient Instructions (Addendum)
 ______________________________________________________________________    General Risks and Possible Complications  Patient Responsibilities: It is important that you read this as it is part of your informed consent. It is our duty to inform you of the risks and possible complications associated with treatments offered to you. It is your responsibility as a patient to read this and to ask questions about anything that is not clear or that you believe was not covered in this document.  Patient's Rights: You have the right to refuse treatment. You also have the right to change your mind, even after initially having agreed to have the treatment done. However, under this last option, if you wait until the last second to change your mind, you may be charged for the materials used up to that point.  Introduction: Medicine is not an Visual merchandiser. Everything in Medicine, including the lack of treatment(s), carries the potential for danger, harm, or loss (which is by definition: Risk). In Medicine, a complication is a secondary problem, condition, or disease that can aggravate an already existing one. All treatments carry the risk of possible complications. The fact that a side effects or complications occurs, does not imply that the treatment was conducted incorrectly. It must be clearly understood that these can happen even when everything is done following the highest safety standards.  No treatment: You can choose not to proceed with the proposed treatment alternative. The "PRO(s)" would include: avoiding the risk of complications associated with the therapy. The "CON(s)" would include: not getting any of the treatment benefits. These benefits fall under one of three categories: diagnostic; therapeutic; and/or palliative. Diagnostic benefits include: getting information which can ultimately lead to improvement of the disease or symptom(s). Therapeutic benefits are those associated with the successful  treatment of the disease. Finally, palliative benefits are those related to the decrease of the primary symptoms, without necessarily curing the condition (example: decreasing the pain from a flare-up of a chronic condition, such as incurable terminal cancer).  General Risks and Complications: These are associated to most interventional treatments. They can occur alone, or in combination. They fall under one of the following six (6) categories: no benefit or worsening of symptoms; bleeding; infection; nerve damage; allergic reactions; and/or death. No benefits or worsening of symptoms: In Medicine there are no guarantees, only probabilities. No healthcare provider can ever guarantee that a medical treatment will work, they can only state the probability that it may. Furthermore, there is always the possibility that the condition may worsen, either directly, or indirectly, as a consequence of the treatment. Bleeding: This is more common if the patient is taking a blood thinner, either prescription or over the counter (example: Goody Powders, Fish oil, Aspirin, Garlic, etc.), or if suffering a condition associated with impaired coagulation (example: Hemophilia, cirrhosis of the liver, low platelet counts, etc.). However, even if you do not have one on these, it can still happen. If you have any of these conditions, or take one of these drugs, make sure to notify your treating physician. Infection: This is more common in patients with a compromised immune system, either due to disease (example: diabetes, cancer, human immunodeficiency virus [HIV], etc.), or due to medications or treatments (example: therapies used to treat cancer and rheumatological diseases). However, even if you do not have one on these, it can still happen. If you have any of these conditions, or take one of these drugs, make sure to notify your treating physician. Nerve Damage: This is more common when the treatment is  an invasive one, but it  can also happen with the use of medications, such as those used in the treatment of cancer. The damage can occur to small secondary nerves, or to large primary ones, such as those in the spinal cord and brain. This damage may be temporary or permanent and it may lead to impairments that can range from temporary numbness to permanent paralysis and/or brain death. Allergic Reactions: Any time a substance or material comes in contact with our body, there is the possibility of an allergic reaction. These can range from a mild skin rash (contact dermatitis) to a severe systemic reaction (anaphylactic reaction), which can result in death. Death: In general, any medical intervention can result in death, most of the time due to an unforeseen complication. ______________________________________________________________________      ______________________________________________________________________    Preparing for your procedure  Appointments: If you think you may not be able to keep your appointment, call 24-48 hours in advance to cancel. We need time to make it available to others.  Procedure visits are for procedures only. During your procedure appointment there will be: NO Prescription Refills*. NO medication changes or discussions*. NO discussion of disability issues*. NO unrelated pain problem evaluations*. NO evaluations to order other pain procedures*. *These will be addressed at a separate and distinct evaluation encounter on the provider's evaluation schedule and not during procedure days.  Instructions: Food intake: Avoid eating anything solid for at least 8 hours prior to your procedure. Clear liquid intake: You may take clear liquids such as water up to 2 hours prior to your procedure. (No carbonated drinks. No soda.) Transportation: Unless otherwise stated by your physician, bring a driver. (Driver cannot be a Market researcher, Pharmacist, community, or any other form of public transportation.) Morning  Medicines: Except for blood thinners, take all of your other morning medications with a sip of water. Make sure to take your heart and blood pressure medicines. If your blood pressure's lower number is above 100, the case will be rescheduled. Blood thinners: Make sure to stop your blood thinners as instructed.  If you take a blood thinner, but were not instructed to stop it, call our office [phone] and ask to talk to a nurse. Not stopping a blood thinner prior to certain procedures could lead to serious complications. Diabetics on insulin: Notify the staff so that you can be scheduled 1st case in the morning. If your diabetes requires high dose insulin, take only  of your normal insulin dose the morning of the procedure and notify the staff that you have done so. Preventing infections: Shower with an antibacterial soap the morning of your procedure.  Build-up your immune system: Take 1000 mg of Vitamin C with every meal (3 times a day) the day prior to your procedure. Antibiotics: Inform the nursing staff if you are taking any antibiotics or if you have any conditions that may require antibiotics prior to procedures. (Example: recent joint implants)   Pregnancy: If you are pregnant make sure to notify the nursing staff. Not doing so may result in injury to the fetus, including death.  Sickness: If you have a cold, fever, or any active infections, call and cancel or reschedule your procedure. Receiving steroids while having an infection may result in complications. Arrival: You must be in the facility at least 30 minutes prior to your scheduled procedure. Tardiness: Your scheduled time is also the cutoff time. If you do not arrive at least 15 minutes prior to your procedure, you will  be rescheduled.  Children: Do not bring any children with you. Make arrangements to keep them home. Dress appropriately: There is always a possibility that your clothing may get soiled. Avoid long dresses. Valuables:  Do not bring any jewelry or valuables.  Reasons to call and reschedule or cancel your procedure: (Following these recommendations will minimize the risk of a serious complication.) Surgeries: Avoid having procedures within 2 weeks of any surgery. (Avoid for 2 weeks before or after any surgery). Flu Shots: Avoid having procedures within 2 weeks of a flu shots or . (Avoid for 2 weeks before or after immunizations). Barium: Avoid having a procedure within 7-10 days after having had a radiological study involving the use of radiological contrast. (Myelograms, Barium swallow or enema study). Heart attacks: Avoid any elective procedures or surgeries for the initial 6 months after a "Myocardial Infarction" (Heart Attack). Blood thinners: It is imperative that you stop these medications before procedures. Let us know if you if you take any blood thinner.  Infection: Avoid procedures during or within two weeks of an infection (including chest colds or gastrointestinal problems). Symptoms associated with infections include: Localized redness, fever, chills, night sweats or profuse sweating, burning sensation when voiding, cough, congestion, stuffiness, runny nose, sore throat, diarrhea, nausea, vomiting, cold or Flu symptoms, recent or current infections. It is specially important if the infection is over the area that we intend to treat. Heart and lung problems: Symptoms that may suggest an active cardiopulmonary problem include: cough, chest pain, breathing difficulties or shortness of breath, dizziness, ankle swelling, uncontrolled high or unusually low blood pressure, and/or palpitations. If you are experiencing any of these symptoms, cancel your procedure and contact your primary care physician for an evaluation.  Remember:  Regular Business hours are:  Monday to Thursday 8:00 AM to 4:00 PM  Provider's Schedule: Delano Metz, MD:  Procedure days: Tuesday and Thursday 7:30 AM to 4:00 PM  Edward Jolly, MD:  Procedure days: Monday and Wednesday 7:30 AM to 4:00 PM Last  Updated: 12/12/2022 ______________________________________________________________________     Occipital Nerve Block Patient Information  Description: The occipital nerves originate in the cervical (neck) spinal cord and travel upward through muscle and tissue to supply sensation to the back of the head and top of the scalp.  In addition, the nerves control some of the muscles of the scalp.  Occipital neuralgia is an irritation of these nerves which can cause headaches, numbness of the scalp, and neck discomfort.     The occipital nerve block will interrupt nerve transmission through these nerves and can relieve pain and spasm.  The block consists of insertion of a small needle under the skin in the back of the head to deposit local anesthetic (numbing medicine) and/or steroids around the nerve.  The entire block usually lasts less than 5 minutes.  Conditions which may be treated by occipital blocks:  Muscular pain and spasm of the scalp Nerve irritation, back of the head Headaches Upper neck pain  Preparation for the injection:  Do not eat any solid food or dairy products within 8 hours of your appointment. You may drink clear liquids up to 3 hours before appointment.  Clear liquids include water, black coffee, juice or soda.  No milk or cream please. You may take your regular medication, including pain medications, with a sip of water before you appointment.  Diabetics should hold regular insulin (if taken separately) and take 1/2 normal NPH dose the morning of the procedure.  Carry  some sugar containing items with you to your appointment. A driver must accompany you and be prepared to drive you home after your procedure. Bring all your current medications with you. An IV may be inserted and sedation may be given at the discretion of the physician. A blood pressure cuff, EKG, and other monitors will often be  applied during the procedure.  Some patients may need to have extra oxygen administered for a short period. You will be asked to provide medical information, including your allergies and medications, prior to the procedure.  We must know immediately if you are taking blood thinners (like Coumadin/Warfarin) or if you are allergic to IV iodine contrast (dye).  We must know if you could possible be pregnant.  Do not wear a high collared shirt or turtleneck.  Tie long hair up in the back if possible.  Possible side-effects:  Bleeding from needle site Infection (rare, may require surgery) Nerve injury (rare) Hair on back of neck can be tinged with iodine scrub (this will wash out) Light-headedness (temporary) Pain at injection site (several days) Decreased blood pressure (rare, temporary) Seizure (very rare)  Call if you experience:  Hives or difficulty breathing ( go to the emergency room) Inflammation or drainage at the injection site(s)  Please note:  Although the local anesthetic injected can often make your painful muscles or headache feel good for several hours after the injection, the pain may return.  It takes 3-7 days for steroids to work.  You may not notice any pain relief for at least one week.  If effective, we will often do a series of injections spaced 3-6 weeks apart to maximally decrease your pain.  If you have any questions, please call [phone] [facility] Facet Blocks Patient Information  Description: The facets are joints in the spine between the vertebrae.  Like any joints in the body, facets can become irritated and painful.  Arthritis can also effect the facets.  By injecting steroids and local anesthetic in and around these joints, we can temporarily block the nerve supply to them.  Steroids act directly on irritated nerves and tissues to reduce selling and inflammation which often leads to decreased pain.  Facet blocks may be  done anywhere along the spine from the neck to the low back depending upon the location of your pain.   After numbing the skin with local anesthetic (like Novocaine), a small needle is passed onto the facet joints under x-ray guidance.  You may experience a sensation of pressure while this is being done.  The entire block usually lasts about 15-25 minutes.   Conditions which may be treated by facet blocks:  Low back/buttock pain Neck/shoulder pain Certain types of headaches  Preparation for the injection:  Do not eat any solid food or dairy products within 8 hours of your appointment. You may drink clear liquid up to 3 hours before appointment.  Clear liquids include water, black coffee, juice or soda.  No milk or cream please. You may take your regular medication, including pain medications, with a sip of water before your appointment.  Diabetics should hold regular insulin (if taken separately) and take 1/2 normal NPH dose the morning of the procedure.  Carry some sugar containing items with you to your appointment. A driver must accompany you and be prepared to drive you home after your procedure. Bring all your current medications with you. An IV may be inserted and sedation may be given at  the discretion of the physician. A blood pressure cuff, EKG and other monitors will often be applied during the procedure.  Some patients may need to have extra oxygen administered for a short period. You will be asked to provide medical information, including your allergies and medications, prior to the procedure.  We must know immediately if you are taking blood thinners (like Coumadin/Warfarin) or if you are allergic to IV iodine contrast (dye).  We must know if you could possible be pregnant.  Possible side-effects:  Bleeding from needle site Infection (rare, may require surgery) Nerve injury (rare) Numbness & tingling (temporary) Difficulty urinating (rare, temporary) Spinal headache (a  headache worse with upright posture) Light-headedness (temporary) Pain at injection site (serveral days) Decreased blood pressure (rare, temporary) Weakness in arm/leg (temporary) Pressure sensation in back/neck (temporary)   Call if you experience:  Fever/chills associated with headache or increased back/neck pain Headache worsened by an upright position New onset, weakness or numbness of an extremity below the injection site Hives or difficulty breathing (go to the emergency room) Inflammation or drainage at the injection site(s) Severe back/neck pain greater than usual New symptoms which are concerning to you  Please note:  Although the local anesthetic injected can often make your back or neck feel good for several hours after the injection, the pain will likely return. It takes 3-7 days for steroids to work.  You may not notice any pain relief for at least one week.  If effective, we will often do a series of 2-3 injections spaced 3-6 weeks apart to maximally decrease your pain.  After the initial series, you may be a candidate for a more permanent nerve block of the facets.  If you have any questions, please call [phone] [facility]

## 2024-01-18 NOTE — Anesthesia Postprocedure Evaluation (Signed)
"   Anesthesia Post Note  Patient: Vanessa Oneill  Procedure(s) Performed: BRONCHOSCOPY, VIDEO-ASSISTED BRONCHOSCOPY, WITH EBUS  Patient location during evaluation: PACU Anesthesia Type: General Level of consciousness: awake and alert Pain management: pain level controlled Vital Signs Assessment: post-procedure vital signs reviewed and stable Respiratory status: spontaneous breathing, nonlabored ventilation, respiratory function stable and patient connected to nasal cannula oxygen Cardiovascular status: blood pressure returned to baseline and stable Postop Assessment: no apparent nausea or vomiting Anesthetic complications: no   No notable events documented.   Last Vitals:  Vitals:   01/11/24 1330 01/11/24 1446  BP: (P) 110/65 (!) 144/76  Pulse:  76  Resp:  14  Temp: (P) 36.4 C 36.6 C  SpO2:  94%    Last Pain:  Vitals:   01/14/24 1614  PainSc: 0-No pain                 Lynwood KANDICE Clause      "

## 2024-01-31 ENCOUNTER — Telehealth: Payer: Self-pay | Admitting: *Deleted

## 2024-01-31 ENCOUNTER — Other Ambulatory Visit (INDEPENDENT_AMBULATORY_CARE_PROVIDER_SITE_OTHER): Payer: Self-pay | Admitting: Podiatry

## 2024-01-31 ENCOUNTER — Ambulatory Visit (INDEPENDENT_AMBULATORY_CARE_PROVIDER_SITE_OTHER): Payer: Self-pay

## 2024-01-31 DIAGNOSIS — I739 Peripheral vascular disease, unspecified: Secondary | ICD-10-CM

## 2024-01-31 LAB — VAS US ABI WITH/WO TBI
Left ABI: 1.08
Right ABI: 1.05

## 2024-01-31 NOTE — Telephone Encounter (Signed)
 Caller verified using pt's full name and dob prior to discussing PHI    Patient called triage to review her MRI brain results and to inquire if her follow-up with Dr. Babara in March is still needed. I explained to her the MRI results did not show any evidence of mets to brain. Discussed that she still needs to keep apts in March as scheduled. She thanked me for reviewing results.

## 2024-02-04 ENCOUNTER — Ambulatory Visit: Admitting: Student in an Organized Health Care Education/Training Program

## 2024-02-04 ENCOUNTER — Encounter: Admitting: Nurse Practitioner

## 2024-02-11 ENCOUNTER — Ambulatory Visit: Admitting: Student in an Organized Health Care Education/Training Program

## 2024-03-24 ENCOUNTER — Ambulatory Visit

## 2024-03-31 ENCOUNTER — Inpatient Hospital Stay: Admitting: Oncology

## 2024-03-31 ENCOUNTER — Inpatient Hospital Stay

## 2024-05-06 ENCOUNTER — Encounter: Payer: Self-pay | Admitting: Nurse Practitioner
